# Patient Record
Sex: Male | Born: 1955 | Race: Black or African American | Hispanic: No | Marital: Single | Smoking: Current every day smoker
Health system: Southern US, Community
[De-identification: ages and names within clinical notes are randomized; demographics above are authoritative.]

## PROBLEM LIST (undated history)

## (undated) DIAGNOSIS — M549 Dorsalgia, unspecified: Secondary | ICD-10-CM

## (undated) DIAGNOSIS — R200 Anesthesia of skin: Secondary | ICD-10-CM

## (undated) DIAGNOSIS — F99 Mental disorder, not otherwise specified: Secondary | ICD-10-CM

## (undated) DIAGNOSIS — S92901A Unspecified fracture of right foot, initial encounter for closed fracture: Secondary | ICD-10-CM

## (undated) DIAGNOSIS — Z992 Dependence on renal dialysis: Secondary | ICD-10-CM

## (undated) DIAGNOSIS — G8929 Other chronic pain: Secondary | ICD-10-CM

## (undated) DIAGNOSIS — I10 Essential (primary) hypertension: Secondary | ICD-10-CM

## (undated) DIAGNOSIS — D649 Anemia, unspecified: Secondary | ICD-10-CM

## (undated) DIAGNOSIS — F419 Anxiety disorder, unspecified: Secondary | ICD-10-CM

## (undated) DIAGNOSIS — N289 Disorder of kidney and ureter, unspecified: Secondary | ICD-10-CM

## (undated) DIAGNOSIS — K759 Inflammatory liver disease, unspecified: Secondary | ICD-10-CM

## (undated) HISTORY — DX: Anesthesia of skin: R20.0

## (undated) HISTORY — PX: OTHER SURGICAL HISTORY: SHX169

---

## 2004-03-19 ENCOUNTER — Emergency Department (HOSPITAL_COMMUNITY): Admission: EM | Admit: 2004-03-19 | Discharge: 2004-03-19 | Payer: Self-pay | Admitting: Emergency Medicine

## 2004-07-06 ENCOUNTER — Emergency Department (HOSPITAL_COMMUNITY): Admission: EM | Admit: 2004-07-06 | Discharge: 2004-07-06 | Payer: Self-pay | Admitting: Emergency Medicine

## 2004-07-09 ENCOUNTER — Emergency Department (HOSPITAL_COMMUNITY): Admission: EM | Admit: 2004-07-09 | Discharge: 2004-07-09 | Payer: Self-pay | Admitting: *Deleted

## 2004-07-11 ENCOUNTER — Emergency Department (HOSPITAL_COMMUNITY): Admission: EM | Admit: 2004-07-11 | Discharge: 2004-07-11 | Payer: Self-pay | Admitting: Emergency Medicine

## 2004-07-18 ENCOUNTER — Ambulatory Visit (HOSPITAL_COMMUNITY): Admission: RE | Admit: 2004-07-18 | Discharge: 2004-07-18 | Payer: Self-pay | Admitting: Family Medicine

## 2005-10-01 ENCOUNTER — Emergency Department (HOSPITAL_COMMUNITY): Admission: EM | Admit: 2005-10-01 | Discharge: 2005-10-01 | Payer: Self-pay | Admitting: Emergency Medicine

## 2007-06-26 ENCOUNTER — Inpatient Hospital Stay (HOSPITAL_COMMUNITY): Admission: EM | Admit: 2007-06-26 | Discharge: 2007-06-29 | Payer: Self-pay | Admitting: Emergency Medicine

## 2010-03-11 ENCOUNTER — Ambulatory Visit (HOSPITAL_COMMUNITY): Admission: RE | Admit: 2010-03-11 | Discharge: 2010-03-11 | Payer: Self-pay | Admitting: Urology

## 2010-07-07 ENCOUNTER — Emergency Department (HOSPITAL_COMMUNITY): Admission: EM | Admit: 2010-07-07 | Discharge: 2010-07-07 | Payer: Self-pay | Admitting: Emergency Medicine

## 2011-01-12 LAB — BLOOD GAS, ARTERIAL
Patient temperature: 37
TCO2: 20.4 mmol/L (ref 0–100)
pH, Arterial: 7.377 (ref 7.350–7.450)
pO2, Arterial: 74.5 mmHg — ABNORMAL LOW (ref 80.0–100.0)

## 2011-01-12 LAB — URINALYSIS, ROUTINE W REFLEX MICROSCOPIC
Bilirubin Urine: NEGATIVE
Hgb urine dipstick: NEGATIVE
Protein, ur: NEGATIVE mg/dL
Specific Gravity, Urine: 1.01 (ref 1.005–1.030)
pH: 5.5 (ref 5.0–8.0)

## 2011-01-12 LAB — CBC
HCT: 41.6 % (ref 39.0–52.0)
Hemoglobin: 14.1 g/dL (ref 13.0–17.0)
MCH: 31.5 pg (ref 26.0–34.0)
MCHC: 34 g/dL (ref 30.0–36.0)
MCV: 92.7 fL (ref 78.0–100.0)
Platelets: 213 10*3/uL (ref 150–400)
WBC: 7.9 10*3/uL (ref 4.0–10.5)

## 2011-01-12 LAB — DIFFERENTIAL
Basophils Relative: 1 % (ref 0–1)
Eosinophils Absolute: 0.1 10*3/uL (ref 0.0–0.7)
Monocytes Relative: 7 % (ref 3–12)

## 2011-01-12 LAB — BASIC METABOLIC PANEL
Chloride: 104 mEq/L (ref 96–112)
Sodium: 134 mEq/L — ABNORMAL LOW (ref 135–145)

## 2011-01-12 LAB — URINE MICROSCOPIC-ADD ON: Urine-Other: NONE SEEN

## 2011-01-12 LAB — GLUCOSE, CAPILLARY: Glucose-Capillary: 468 mg/dL — ABNORMAL HIGH (ref 70–99)

## 2011-03-14 NOTE — H&P (Signed)
NAME:  Charles [name], Charles [name] NO.:  [id number]   MEDICAL RECORD NO.:  [id number]          PATIENT TYPE:  INP   LOCATION:  A222                          FACILITY:  [facility]   PHYSICIAN:  Salem Caster, DO    DATE OF BIRTH:  [birth year]   DATE OF ADMISSION:  06/26/2007  DATE OF DISCHARGE:  LH                              HISTORY & PHYSICAL   CHIEF COMPLAINT:  Elevated blood sugar and a penile rash.   HISTORY OF PRESENT ILLNESS:  This is a 55 year old African/American male  who presents from the [facility], secondary to elevated blood  sugars.  Apparently the patient went to the Health Department today,  secondary to having a three to four-day history of a penile rash.  The  patient states that he noticed a rash on his penis for a few days, and  decided to be evaluated at the Health Department.  After being seen at  the Health Department and after blood work was done, the patient was  found to have an elevated blood sugar and was sent directly to the [facility] Emergency Room for evaluation.   Upon being seen in the emergency room  the patient was found to have a  blood sugar of 784, but otherwise the patient has no complaints, other  than his penile rash.  The patient is awake and alert and oriented, and  in no acute distress at this time.   PAST MEDICAL HISTORY:  Remarkable.  The patient has no previous medical  history.  He does not see physicians on a regular basis.   HOME MEDICATIONS:  None.   PAST SURGICAL HISTORY:  None.   SOCIAL HISTORY:  The patient is a one-pack-per-day smoker for over 20  years.  The patient is a heavy drinker.  He admits to drinking three to  five 40-ounce beers daily.  The patient also admits to drug use.  He  used crack cocaine one week prior.   FAMILY HISTORY:  Diabetes in his brother.  The patient does not know any  other history of his family members.   ALLERGIES:  No known drug allergies.   REVIEW OF SYSTEMS:   GENERAL:  He denies any appetite problems, weight  change, fever or chills.  HEENT:  Unremarkable.  CARDIOVASCULAR:  No  chest pain.  RESPIRATORY:  No dyspnea or shortness of breath.  GASTROINTESTINAL:  No abdominal pain or  diarrhea.  GENITOURINARY:  No  dysuria.  Positive for a penile rash.  MUSCULOSKELETAL:  No arthralgias,  neck or back pain.  All other systems are negative.   PHYSICAL EXAMINATION:  GENERAL:  The patient  is well-hydrated, in no  acute distress, well-developed and well-nourished, alert and oriented.  HEENT:  Head normocephalic and atraumatic.  Pupils equal, round,  reactive to light and accommodation.  EOMI.  NECK:  No thyromegaly, no jugular venous distention.  Neck is soft and  supple, nontender, non-distended  CARDIOVASCULAR:  A regular rate and rhythm.  No murmurs, rubs or  gallops.  LUNGS:  Clear to auscultation bilaterally.  No  rales, rhonchi or  wheezing.  ABDOMEN:  Soft, nontender, non-distended.  No rebound, rigidity or  guarding.  Positive bowel sounds.  GENITOURINARY:  Excoriation and fungal infection around the glans penis.  No discharge noted.  EXTREMITIES:  No clubbing, cyanosis or edema.  A full range of motion.  NEUROLOGIC:  Cranial nerves II-XII  grossly intact.  SKIN:  No rashes or petechiae noted.  VITAL SIGNS:  Temperature 97.3 degrees, respirations 20, pulse 90, blood  pressure 125/73.   LABORATORY DATA:  Sodium 127, potassium 4.2, chloride 95, CO2 of 25,  glucose 784, BUN 14, creatinine 1.17.  Calcium 9.3, albumin 3.9, AST 40,  ALT 59, alkaline phosphatase 158, total bilirubin 0.9.  Urine:  Greater  than 1000 glucose, no hemoglobin, no ketones, no nitrites, no leukocyte  esterase.  White count 8.2, hemoglobin 14.9, hematocrit 42.7, platelets  212.   ASSESSMENT:  This is a 54 year old black male who presents from the  Health Department with an elevated blood sugar and a penile infection.  When seen in the emergency room the patient was  found to have a blood  sugar in the 700 range.  The patient is stable, alert and oriented.  He  does not appear to be in diabetic ketoacidosis.   The patient was started on insulin via Glucommander in the emergency  room.  The patient's blood sugars are noted in the mid-300 range.   PLAN:  1. Hyperglycemia, probably newly-diagnosed diabetes:  The patient was      placed on Lantus daily, as well as Metformin orally.  He will get      diabetes education.  The patient's blood sugars will be checked      q.a.c. and q.h.s.  The patient will be placed on a moderate sliding      scale.  2. Yeast infection on his penis:  The patient will be given Mycostatin      cream three times a day.  3. Hyponatremia:  The patient will be IV-hydrated with normal saline      and the sodium will be rechecked in the a.m.      Salem Caster, DO  Electronically Signed     SM/MEDQ  D:  06/26/2007  T:  06/27/2007  Job:  [id number]

## 2011-03-14 NOTE — Discharge Summary (Signed)
NAME:  Charles [name], SHAKE NO.:  [id number]   MEDICAL RECORD NO.:  [id number]          PATIENT TYPE:  INP   LOCATION:  A222                          FACILITY:  [facility]   PHYSICIAN:  Bonnielee Haff, MD     DATE OF BIRTH:  04-Jan-1956   DATE OF ADMISSION:  06/26/2007  DATE OF DISCHARGE:  LH                               DISCHARGE SUMMARY   The patient goes to the health department for his medical needs.   DISCHARGE DIAGNOSES:  1. New-onset diabetes requiring insulin.  2. Tobacco abuse.  3. Alcohol abuse.  4. Cocaine abuse as well.   BRIEF HOSPITAL COURSE:  Please review Dr. Maxie Barb H&P for details  regarding the patient's presenting illness.   Briefly, this is a 55 year old African American male who presented from  the health department after they found elevated blood sugars.  The  patient was found to have a blood sugar of 784 in the hospital.  He was  put on Glucommander and was transitioned to Lantus insulin.  The patient  has done well in the hospital.  His blood sugars are now running 189,  215, 137.  He has been given diabetes education.  He has been taught how  to self-administer insulin.  He has been told the importance of being  compliant with his medications.  His fasting lipid profile showed an LDL  of 96.  His triglycerides were 317.  Diet modification was suggested.  Urine did not show any evidence for proteinuria.   Today we will try to arrange prescription assistance for this gentleman.  If we are able to do that, he will be discharged later today.   During this admission he was also complaining of itching over his penis  and he was found to have a yeast infection there.  He mentioned that he  did have an HIV test done just a week ago and apparently was negative.  Anyway, Nystatin was prescribed and his lesion is better.   Otherwise, today he does not have any complaints to offer.  He said he  had uneventful night.  His vital signs are all stable,  examination  unremarkable.   ASSESSMENT AND PLAN:  As per above.   DISCHARGE MEDICATIONS:  1. Lantus insulin 40 units q.h.s. subcu.  2. Nystatin cream to his penis twice daily for yeast infection.  3. Metformin 500 mg p.o. b.i.d.  4. Aspirin 81 mg p.o. daily.   DIET:  Modified carbohydrate diet.   PHYSICAL ACTIVITY:  No restrictions.   No consultations obtained during this admission.   He did have a chest x-ray which did not show any acute cardiopulmonary  process.   FOLLOWUP:  Health department - he has been asked to go there on Tuesday,  September 2.   Total time at discharge 35 minutes.      Bonnielee Haff, MD  Electronically Signed     GK/MEDQ  D:  06/29/2007  T:  06/29/2007  Job:  [id number]

## 2011-03-14 NOTE — Group Therapy Note (Signed)
NAME:  Charles [name], Charles [name] NO.:  [id number]   MEDICAL RECORD NO.:  [id number]          PATIENT TYPE:  INP   LOCATION:  A222                          FACILITY:  [facility]   PHYSICIAN:  Anselmo Pickler, DO    DATE OF BIRTH:  Nov 29, 1955   DATE OF PROCEDURE:  06/27/2007  DATE OF DISCHARGE:                                 PROGRESS NOTE   The patient was seen today resting comfortably in bed.  He was taken off  of the Glucommander.  Blood sugars are still running high but he is on  sliding scale coverage.  Discussed with him his diabetes and the course  of how he was diagnosed, and also discussed with the patient that we  will try to find him a primary care physician upon discharge.  His vital  signs are 98.5, pulse 62, respirations 24, blood pressure 136/84.   GENERAL:  The patient seen comfortably resting in bed.  HEART:  Regular rate and rhythm, no murmur noted.  LUNGS:  Clear to auscultation bilaterally.  ABDOMEN:  Soft, nontender, nondistended.  GENITOURINARY:  Positive candida infection of his penis.  LOWER EXTREMITIES:  Positive pulses in all four extremities.  No edema,  cyanosis or ecchymosis.   LABORATORIES:  White count of 7.5, hemoglobin 14.3, hematocrit 41.4, and  platelet count of 193.  His sodium was 136, potassium 4.1, chloride 105,  CO2 25, glucose 273, BUN 11, creatinine 0.93, calcium 8.6.   ASSESSMENT AND PLAN:  1. Hyperglycemia, new-onset diabetes.  2. Yeast infection of the penis.   Will continue the patient on sliding scale and start Lantus tonight, and  continue to monitor him.  Also, he has been ordered to start metformin  as well, and will continue with CBGs a.c. and q.h.s., and also will  continue with the Mycostatin.  Plan on discharging the patient in 1-2  days.      Anselmo Pickler, DO  Electronically Signed     CB/MEDQ  D:  06/27/2007  T:  06/27/2007  Job:  [id number]

## 2011-08-11 LAB — URINALYSIS, ROUTINE W REFLEX MICROSCOPIC
Hgb urine dipstick: NEGATIVE
Nitrite: NEGATIVE
Protein, ur: NEGATIVE
Specific Gravity, Urine: 1.01
Urobilinogen, UA: 0.2

## 2011-08-11 LAB — BASIC METABOLIC PANEL
CO2: 25
Calcium: 8.7
Chloride: 105
Chloride: 105
Creatinine, Ser: 0.89
GFR calc Af Amer: >60
GFR calc non Af Amer: >60
GFR calc non Af Amer: >60
Potassium: 4.1
Sodium: 136

## 2011-08-11 LAB — URINE MICROSCOPIC-ADD ON

## 2011-08-11 LAB — PROTIME-INR
INR: 1
Prothrombin Time: 13.9

## 2011-08-11 LAB — RAPID URINE DRUG SCREEN, HOSP PERFORMED
Amphetamines: NOT DETECTED
Barbiturates: NOT DETECTED
Benzodiazepines: NOT DETECTED
Opiates: NOT DETECTED

## 2011-08-11 LAB — TSH: TSH: 1.089

## 2011-08-11 LAB — CBC
HCT: 41.4
HCT: 42.7
Hemoglobin: 14.3
MCHC: 34.9
MCV: 91.3
MCV: 92.4
RBC: 4.54
RDW: 12.9
RDW: 13
WBC: 7.5
WBC: 8.2

## 2011-08-11 LAB — DIFFERENTIAL
Basophils Relative: 0
Eosinophils Absolute: 0.1
Eosinophils Relative: 1
Eosinophils Relative: 2
Lymphocytes Relative: 33
Lymphs Abs: 3.2
Monocytes Relative: 6
Neutrophils Relative %: 61

## 2011-08-11 LAB — CULTURE, BLOOD (ROUTINE X 2)
Culture: NO GROWTH
Report Status: 9022008
Report Status: 9022008

## 2011-08-11 LAB — URINE CULTURE: Special Requests: NEGATIVE

## 2011-08-11 LAB — COMPREHENSIVE METABOLIC PANEL
ALT: 59 — ABNORMAL HIGH
AST: 40 — ABNORMAL HIGH
Alkaline Phosphatase: 158 — ABNORMAL HIGH
Creatinine, Ser: 1.17
GFR calc Af Amer: >60
Potassium: 4.2
Total Bilirubin: 0.9

## 2011-08-11 LAB — GLUCOSE, RANDOM: Glucose, Bld: 590

## 2011-08-11 LAB — LIPID PANEL
Cholesterol: 188
HDL: 29 — ABNORMAL LOW

## 2011-08-11 LAB — HEMOGLOBIN A1C: Hgb A1c MFr Bld: 10.6 — ABNORMAL HIGH

## 2011-08-11 LAB — APTT: aPTT: 24

## 2011-08-20 ENCOUNTER — Emergency Department (HOSPITAL_COMMUNITY): Payer: Self-pay

## 2011-08-20 ENCOUNTER — Inpatient Hospital Stay (HOSPITAL_COMMUNITY)
Admission: EM | Admit: 2011-08-20 | Discharge: 2011-08-24 | DRG: 493 | Disposition: A | Discharge status: Skilled Nursing Facility | Payer: No Typology Code available for payment source | Source: Ambulatory Visit | Attending: Surgery | Admitting: Surgery

## 2011-08-20 DIAGNOSIS — S060X9A Concussion with loss of consciousness of unspecified duration, initial encounter: Secondary | ICD-10-CM

## 2011-08-20 DIAGNOSIS — E669 Obesity, unspecified: Secondary | ICD-10-CM | POA: Diagnosis present

## 2011-08-20 DIAGNOSIS — R51 Headache: Secondary | ICD-10-CM | POA: Diagnosis present

## 2011-08-20 DIAGNOSIS — F1010 Alcohol abuse, uncomplicated: Secondary | ICD-10-CM | POA: Diagnosis present

## 2011-08-20 DIAGNOSIS — M549 Dorsalgia, unspecified: Secondary | ICD-10-CM | POA: Diagnosis present

## 2011-08-20 DIAGNOSIS — S91109A Unspecified open wound of unspecified toe(s) without damage to nail, initial encounter: Secondary | ICD-10-CM | POA: Diagnosis present

## 2011-08-20 DIAGNOSIS — S82209A Unspecified fracture of shaft of unspecified tibia, initial encounter for closed fracture: Secondary | ICD-10-CM

## 2011-08-20 DIAGNOSIS — Z79899 Other long term (current) drug therapy: Secondary | ICD-10-CM

## 2011-08-20 DIAGNOSIS — Z794 Long term (current) use of insulin: Secondary | ICD-10-CM

## 2011-08-20 DIAGNOSIS — D62 Acute posthemorrhagic anemia: Secondary | ICD-10-CM | POA: Diagnosis present

## 2011-08-20 DIAGNOSIS — I10 Essential (primary) hypertension: Secondary | ICD-10-CM | POA: Diagnosis present

## 2011-08-20 DIAGNOSIS — E119 Type 2 diabetes mellitus without complications: Secondary | ICD-10-CM | POA: Diagnosis present

## 2011-08-20 DIAGNOSIS — F17200 Nicotine dependence, unspecified, uncomplicated: Secondary | ICD-10-CM | POA: Diagnosis present

## 2011-08-20 DIAGNOSIS — F209 Schizophrenia, unspecified: Secondary | ICD-10-CM | POA: Diagnosis present

## 2011-08-20 DIAGNOSIS — G8929 Other chronic pain: Secondary | ICD-10-CM | POA: Diagnosis present

## 2011-08-20 DIAGNOSIS — S92909A Unspecified fracture of unspecified foot, initial encounter for closed fracture: Secondary | ICD-10-CM

## 2011-08-20 DIAGNOSIS — S82409A Unspecified fracture of shaft of unspecified fibula, initial encounter for closed fracture: Secondary | ICD-10-CM

## 2011-08-20 DIAGNOSIS — S93336A Other dislocation of unspecified foot, initial encounter: Secondary | ICD-10-CM | POA: Diagnosis present

## 2011-08-20 LAB — URINALYSIS, ROUTINE W REFLEX MICROSCOPIC
Bilirubin Urine: NEGATIVE
Glucose, UA: >1000 mg/dL — AB
Nitrite: NEGATIVE
Specific Gravity, Urine: 1.03 (ref 1.005–1.030)
pH: 5 (ref 5.0–8.0)

## 2011-08-20 LAB — COMPREHENSIVE METABOLIC PANEL
ALT: 45 U/L (ref 0–53)
AST: 33 U/L (ref 0–37)
Albumin: 3.7 g/dL (ref 3.5–5.2)
Alkaline Phosphatase: 103 U/L (ref 39–117)
Chloride: 103 mEq/L (ref 96–112)
Potassium: 3.8 mEq/L (ref 3.5–5.1)
Sodium: 134 mEq/L — ABNORMAL LOW (ref 135–145)
Total Bilirubin: 0.2 mg/dL — ABNORMAL LOW (ref 0.3–1.2)
Total Protein: 7.2 g/dL (ref 6.0–8.3)

## 2011-08-20 LAB — GLUCOSE, CAPILLARY

## 2011-08-20 LAB — CBC
Hemoglobin: 13.9 g/dL (ref 13.0–17.0)
MCH: 31.6 pg (ref 26.0–34.0)
MCHC: 34.4 g/dL (ref 30.0–36.0)
MCV: 91.8 fL (ref 78.0–100.0)
Platelets: ADEQUATE 10*3/uL (ref 150–400)
RBC: 4.4 MIL/uL (ref 4.22–5.81)

## 2011-08-20 LAB — POCT I-STAT, CHEM 8
Calcium, Ion: 1.22 mmol/L (ref 1.12–1.32)
Creatinine, Ser: 0.9 mg/dL (ref 0.50–1.35)
Glucose, Bld: 231 mg/dL — ABNORMAL HIGH (ref 70–99)
HCT: 43 % (ref 39.0–52.0)
Hemoglobin: 14.6 g/dL (ref 13.0–17.0)
Potassium: 3.9 mEq/L (ref 3.5–5.1)

## 2011-08-20 LAB — LACTIC ACID, PLASMA: Lactic Acid, Venous: 2.9 mmol/L — ABNORMAL HIGH (ref 0.5–2.2)

## 2011-08-20 LAB — PROTIME-INR: Prothrombin Time: 13.7 seconds (ref 11.6–15.2)

## 2011-08-20 LAB — ABO/RH: ABO/RH(D): O POS

## 2011-08-20 MED ORDER — IOHEXOL 300 MG/ML  SOLN
100.0000 mL | Freq: Once | INTRAMUSCULAR | Status: AC | PRN
  Administered 2011-08-20: 100 mL via INTRAVENOUS

## 2011-08-21 ENCOUNTER — Emergency Department (HOSPITAL_COMMUNITY): Payer: No Typology Code available for payment source

## 2011-08-21 LAB — BASIC METABOLIC PANEL
Calcium: 8.1 mg/dL — ABNORMAL LOW (ref 8.4–10.5)
Chloride: 104 mEq/L (ref 96–112)
Creatinine, Ser: 0.85 mg/dL (ref 0.50–1.35)
GFR calc Af Amer: >90 mL/min (ref >90)
Sodium: 137 mEq/L (ref 135–145)

## 2011-08-21 LAB — GLUCOSE, CAPILLARY
Glucose-Capillary: 188 mg/dL — ABNORMAL HIGH (ref 70–99)
Glucose-Capillary: 223 mg/dL — ABNORMAL HIGH (ref 70–99)
Glucose-Capillary: 236 mg/dL — ABNORMAL HIGH (ref 70–99)
Glucose-Capillary: 244 mg/dL — ABNORMAL HIGH (ref 70–99)
Glucose-Capillary: 214 mg/dL — ABNORMAL HIGH (ref 70–99)

## 2011-08-21 LAB — CBC
MCH: 30.6 pg (ref 26.0–34.0)
Platelets: 177 10*3/uL (ref 150–400)
RBC: 2.81 MIL/uL — ABNORMAL LOW (ref 4.22–5.81)

## 2011-08-22 LAB — CBC
HCT: 22.4 % — ABNORMAL LOW (ref 39.0–52.0)
MCHC: 33.9 g/dL (ref 30.0–36.0)
MCV: 92.2 fL (ref 78.0–100.0)
Platelets: 159 10*3/uL (ref 150–400)
RDW: 13.7 % (ref 11.5–15.5)
WBC: 11.8 10*3/uL — ABNORMAL HIGH (ref 4.0–10.5)

## 2011-08-22 LAB — GLUCOSE, CAPILLARY
Glucose-Capillary: 257 mg/dL — ABNORMAL HIGH (ref 70–99)
Glucose-Capillary: 125 mg/dL — ABNORMAL HIGH (ref 70–99)

## 2011-08-23 ENCOUNTER — Inpatient Hospital Stay (HOSPITAL_COMMUNITY): Payer: No Typology Code available for payment source

## 2011-08-23 LAB — GLUCOSE, CAPILLARY
Glucose-Capillary: 123 mg/dL — ABNORMAL HIGH (ref 70–99)
Glucose-Capillary: 159 mg/dL — ABNORMAL HIGH (ref 70–99)

## 2011-08-23 LAB — CBC
MCHC: 33 g/dL (ref 30.0–36.0)
Platelets: 170 10*3/uL (ref 150–400)
RDW: 13.7 % (ref 11.5–15.5)
WBC: 14.6 10*3/uL — ABNORMAL HIGH (ref 4.0–10.5)

## 2011-08-24 LAB — TYPE AND SCREEN
Antibody Screen: NEGATIVE
Unit division: 0

## 2011-08-24 LAB — URINALYSIS, MICROSCOPIC ONLY
Glucose, UA: 100 mg/dL — AB
Hgb urine dipstick: NEGATIVE
Leukocytes, UA: NEGATIVE
Protein, ur: NEGATIVE mg/dL
Specific Gravity, Urine: 1.026 (ref 1.005–1.030)
Urobilinogen, UA: 2 mg/dL — ABNORMAL HIGH (ref 0.0–1.0)

## 2011-08-24 LAB — CBC
Hemoglobin: 7 g/dL — ABNORMAL LOW (ref 13.0–17.0)
MCH: 31.1 pg (ref 26.0–34.0)
MCV: 93.8 fL (ref 78.0–100.0)
Platelets: 204 10*3/uL (ref 150–400)
RBC: 2.25 MIL/uL — ABNORMAL LOW (ref 4.22–5.81)
WBC: 13.3 10*3/uL — ABNORMAL HIGH (ref 4.0–10.5)

## 2011-08-24 LAB — GLUCOSE, CAPILLARY: Glucose-Capillary: 145 mg/dL — ABNORMAL HIGH (ref 70–99)
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
## 2011-08-25 LAB — URINE CULTURE: Culture  Setup Time: 201210251216
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
## 2011-08-28 LAB — POCT I-STAT 4, (NA,K, GLUC, HGB,HCT)
Glucose, Bld: 169 mg/dL — ABNORMAL HIGH (ref 70–99)
Hemoglobin: 11.9 g/dL — ABNORMAL LOW (ref 13.0–17.0)
Potassium: 4 meq/L (ref 3.5–5.1)
Sodium: 138 meq/L (ref 135–145)

## 2011-09-01 ENCOUNTER — Encounter (HOSPITAL_BASED_OUTPATIENT_CLINIC_OR_DEPARTMENT_OTHER): Payer: Self-pay | Admitting: *Deleted

## 2011-09-01 NOTE — Progress Notes (Signed)
09/01/2011 [facility] facility to speak with Elmyra Ricks --- told she was in a meeting and could not be disturbed - asked receptionist to take down the following and given to her ---- pt needs to be here at 11:00am Tuesday and for him not to take any of his blood thinner between now and then.

## 2011-09-01 NOTE — Progress Notes (Signed)
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 Dr. Albertina Parr said to call and speak with Dr. Beola Cord-- explained above to him and he said OK he needed to fix pt --- he said pt has a hard shoe on one foot and a boot on the other.
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
## 2011-09-05 ENCOUNTER — Encounter (HOSPITAL_BASED_OUTPATIENT_CLINIC_OR_DEPARTMENT_OTHER): Payer: Self-pay

## 2011-09-05 ENCOUNTER — Encounter (HOSPITAL_BASED_OUTPATIENT_CLINIC_OR_DEPARTMENT_OTHER): Payer: Self-pay | Admitting: Anesthesiology

## 2011-09-05 ENCOUNTER — Ambulatory Visit (HOSPITAL_BASED_OUTPATIENT_CLINIC_OR_DEPARTMENT_OTHER)
Admission: RE | Admit: 2011-09-05 | Discharge: 2011-09-05 | Disposition: A | Discharge status: Home / Self Care | Payer: Self-pay | Source: Ambulatory Visit | Attending: Orthopedic Surgery | Admitting: Orthopedic Surgery

## 2011-09-05 ENCOUNTER — Emergency Department (HOSPITAL_COMMUNITY)
Admission: EM | Admit: 2011-09-05 | Discharge: 2011-09-05 | Discharge status: Against Medical Advice | Payer: No Typology Code available for payment source | Attending: Emergency Medicine | Admitting: Emergency Medicine

## 2011-09-05 ENCOUNTER — Encounter (HOSPITAL_BASED_OUTPATIENT_CLINIC_OR_DEPARTMENT_OTHER)
Admission: RE | Disposition: A | Discharge status: Home / Self Care | Payer: Self-pay | Source: Ambulatory Visit | Attending: Orthopedic Surgery

## 2011-09-05 ENCOUNTER — Ambulatory Visit (HOSPITAL_BASED_OUTPATIENT_CLINIC_OR_DEPARTMENT_OTHER): Payer: Self-pay | Admitting: Anesthesiology

## 2011-09-05 DIAGNOSIS — F411 Generalized anxiety disorder: Secondary | ICD-10-CM | POA: Insufficient documentation

## 2011-09-05 DIAGNOSIS — E119 Type 2 diabetes mellitus without complications: Secondary | ICD-10-CM | POA: Insufficient documentation

## 2011-09-05 DIAGNOSIS — I10 Essential (primary) hypertension: Secondary | ICD-10-CM | POA: Insufficient documentation

## 2011-09-05 DIAGNOSIS — M21619 Bunion of unspecified foot: Secondary | ICD-10-CM | POA: Insufficient documentation

## 2011-09-05 DIAGNOSIS — Z0389 Encounter for observation for other suspected diseases and conditions ruled out: Secondary | ICD-10-CM | POA: Insufficient documentation

## 2011-09-05 DIAGNOSIS — S93129A Dislocation of metatarsophalangeal joint of unspecified toe(s), initial encounter: Secondary | ICD-10-CM | POA: Insufficient documentation

## 2011-09-05 DIAGNOSIS — S93529A Sprain of metatarsophalangeal joint of unspecified toe(s), initial encounter: Secondary | ICD-10-CM | POA: Insufficient documentation

## 2011-09-05 DIAGNOSIS — F1010 Alcohol abuse, uncomplicated: Secondary | ICD-10-CM | POA: Insufficient documentation

## 2011-09-05 DIAGNOSIS — Y998 Other external cause status: Secondary | ICD-10-CM | POA: Insufficient documentation

## 2011-09-05 DIAGNOSIS — F209 Schizophrenia, unspecified: Secondary | ICD-10-CM | POA: Insufficient documentation

## 2011-09-05 HISTORY — DX: Other chronic pain: G89.29

## 2011-09-05 HISTORY — DX: Anxiety disorder, unspecified: F41.9

## 2011-09-05 HISTORY — DX: Unspecified fracture of right foot, initial encounter for closed fracture: S92.901A

## 2011-09-05 HISTORY — DX: Essential (primary) hypertension: I10

## 2011-09-05 HISTORY — DX: Anemia, unspecified: D64.9

## 2011-09-05 HISTORY — DX: Dorsalgia, unspecified: M54.9

## 2011-09-05 HISTORY — DX: Mental disorder, not otherwise specified: F99

## 2011-09-05 HISTORY — PX: ORIF TOE FRACTURE: SHX5032

## 2011-09-05 LAB — POCT I-STAT, CHEM 8
BUN: 51 mg/dL — ABNORMAL HIGH (ref 6–23)
Creatinine, Ser: 1.9 mg/dL — ABNORMAL HIGH (ref 0.50–1.35)
Sodium: 135 mEq/L (ref 135–145)
TCO2: 21 mmol/L (ref 0–100)

## 2011-09-05 SURGERY — OPEN REDUCTION INTERNAL FIXATION (ORIF) METATARSAL (TOE) FRACTURE
Anesthesia: General | Site: Toe | Laterality: Left | Wound class: Clean

## 2011-09-05 MED ORDER — BUPIVACAINE HCL (PF) 0.5 % IJ SOLN
INTRAMUSCULAR | Status: DC | PRN
  Administered 2011-09-05: 10 mL

## 2011-09-05 MED ORDER — LIDOCAINE HCL (CARDIAC) 20 MG/ML IV SOLN
INTRAVENOUS | Status: DC | PRN
  Administered 2011-09-05: 50 mg via INTRAVENOUS

## 2011-09-05 MED ORDER — FENTANYL CITRATE 0.05 MG/ML IJ SOLN
INTRAMUSCULAR | Status: DC | PRN
  Administered 2011-09-05: 25 ug via INTRAVENOUS
  Administered 2011-09-05: 100 ug via INTRAVENOUS
  Administered 2011-09-05: 25 ug via INTRAVENOUS

## 2011-09-05 MED ORDER — ONDANSETRON HCL 4 MG/2ML IJ SOLN
INTRAMUSCULAR | Status: DC | PRN
  Administered 2011-09-05: 4 mg via INTRAVENOUS

## 2011-09-05 MED ORDER — PHENYLEPHRINE HCL 10 MG/ML IJ SOLN
INTRAMUSCULAR | Status: DC | PRN
  Administered 2011-09-05 (×3): 80 ug via INTRAVENOUS

## 2011-09-05 MED ORDER — CEFAZOLIN SODIUM 1-5 GM-% IV SOLN
1.0000 g | INTRAVENOUS | Status: AC
  Administered 2011-09-05: 2 g via INTRAVENOUS

## 2011-09-05 MED ORDER — LACTATED RINGERS IV SOLN
INTRAVENOUS | Status: DC
  Administered 2011-09-05 (×2): via INTRAVENOUS

## 2011-09-05 MED ORDER — PROPOFOL 10 MG/ML IV EMUL
INTRAVENOUS | Status: DC | PRN
  Administered 2011-09-05: 200 mg via INTRAVENOUS

## 2011-09-05 MED ORDER — HYDROMORPHONE HCL PF 1 MG/ML IJ SOLN
0.2500 mg | INTRAMUSCULAR | Status: DC | PRN
  Administered 2011-09-05: 0.5 mg via INTRAVENOUS

## 2011-09-05 MED ORDER — PROMETHAZINE HCL 25 MG/ML IJ SOLN: 6.2500 mg | INTRAMUSCULAR | Status: DC | PRN

## 2011-09-05 MED ORDER — MIDAZOLAM HCL 2 MG/2ML IJ SOLN: 0.5000 mg | INTRAMUSCULAR | Status: DC | PRN

## 2011-09-05 MED ORDER — EPHEDRINE SULFATE 50 MG/ML IJ SOLN
INTRAMUSCULAR | Status: DC | PRN
  Administered 2011-09-05: 10 mg via INTRAVENOUS
  Administered 2011-09-05: 15 mg via INTRAVENOUS

## 2011-09-05 SURGICAL SUPPLY — 59 items
BANDAGE ACE 4 STERILE (GAUZE/BANDAGES/DRESSINGS) ×4 IMPLANT
BANDAGE ELASTIC 4 VELCRO ST LF (GAUZE/BANDAGES/DRESSINGS) IMPLANT
BLADE SURG 15 STRL LF DISP TIS (BLADE) ×2 IMPLANT
BLADE SURG 15 STRL SS (BLADE) ×2
BRUSH SCRUB EZ PLAIN DRY (MISCELLANEOUS) ×2 IMPLANT
CLOTH BEACON ORANGE TIMEOUT ST (SAFETY) ×2 IMPLANT
COVER TABLE BACK 60X90 (DRAPES) ×2 IMPLANT
CUFF TOURNIQUET SINGLE 34IN LL (TOURNIQUET CUFF) ×2 IMPLANT
DRAPE EXTREMITY T 121X128X90 (DRAPE) ×2 IMPLANT
DRAPE OEC MINIVIEW 54X84 (DRAPES) IMPLANT
DRAPE SURG 17X23 STRL (DRAPES) ×2 IMPLANT
DRSG PAD ABDOMINAL 8X10 ST (GAUZE/BANDAGES/DRESSINGS) ×2 IMPLANT
DURA STEPPER LG (CAST SUPPLIES) ×4 IMPLANT
DURA STEPPER MED (CAST SUPPLIES) IMPLANT
DURA STEPPER SML (CAST SUPPLIES) IMPLANT
ELECT REM PT RETURN 9FT ADLT (ELECTROSURGICAL) ×2
ELECTRODE REM PT RTRN 9FT ADLT (ELECTROSURGICAL) ×1 IMPLANT
GAUZE SPONGE 4X4 16PLY XRAY LF (GAUZE/BANDAGES/DRESSINGS) IMPLANT
GAUZE XEROFORM 1X8 LF (GAUZE/BANDAGES/DRESSINGS) ×2 IMPLANT
GLOVE BIO SURGEON STRL SZ 6.5 (GLOVE) ×2 IMPLANT
GLOVE BIO SURGEON STRL SZ8 (GLOVE) ×2 IMPLANT
GLOVE BIOGEL PI IND STRL 8 (GLOVE) ×2 IMPLANT
GLOVE BIOGEL PI INDICATOR 8 (GLOVE) ×2
GLOVE SURG SS PI 8.0 STRL IVOR (GLOVE) ×2 IMPLANT
GOWN BRE IMP PREV XXLGXLNG (GOWN DISPOSABLE) ×2 IMPLANT
GOWN PREVENTION PLUS XLARGE (GOWN DISPOSABLE) ×4 IMPLANT
NEEDLE HYPO 22GX1.5 SAFETY (NEEDLE) IMPLANT
NEEDLE HYPO 25X1 1.5 SAFETY (NEEDLE) ×2 IMPLANT
NS IRRIG 1000ML POUR BTL (IV SOLUTION) ×2 IMPLANT
PACK BASIN DAY SURGERY FS (CUSTOM PROCEDURE TRAY) ×2 IMPLANT
PAD CAST 4YDX4 CTTN HI CHSV (CAST SUPPLIES) IMPLANT
PADDING CAST ABS 4INX4YD NS (CAST SUPPLIES) ×2
PADDING CAST ABS COTTON 4X4 ST (CAST SUPPLIES) ×2 IMPLANT
PADDING CAST COTTON 4X4 STRL (CAST SUPPLIES)
PADDING WEBRIL 4 STERILE (GAUZE/BANDAGES/DRESSINGS) ×2 IMPLANT
PENCIL BUTTON HOLSTER BLD 10FT (ELECTRODE) ×2 IMPLANT
SHEET MEDIUM DRAPE 40X70 STRL (DRAPES) ×4 IMPLANT
SLEEVE SCD COMPRESS KNEE MED (MISCELLANEOUS) IMPLANT
SPONGE GAUZE 4X4 12PLY (GAUZE/BANDAGES/DRESSINGS) ×2 IMPLANT
STOCKINETTE 6  STRL (DRAPES) ×1
STOCKINETTE 6 STRL (DRAPES) ×1 IMPLANT
SUCTION FRAZIER TIP 10 FR DISP (SUCTIONS) ×2 IMPLANT
SUT ETHILON 4 0 PS 2 18 (SUTURE) ×2 IMPLANT
SUT FIBERWIRE 3-0 18 TAPR NDL (SUTURE) ×6
SUT PDS AB 3-0 PS2 18 (SUTURE) ×2 IMPLANT
SUT VIC AB 2-0 PS2 27 (SUTURE) IMPLANT
SUT VIC AB 2-0 SH 27 (SUTURE) ×1
SUT VIC AB 2-0 SH 27XBRD (SUTURE) ×1 IMPLANT
SUT VIC AB 3-0 FS2 27 (SUTURE) IMPLANT
SUT VIC AB 3-0 PS1 18 (SUTURE)
SUT VIC AB 3-0 PS1 18XBRD (SUTURE) IMPLANT
SUTURE FIBERWR 3-0 18 TAPR NDL (SUTURE) ×3 IMPLANT
SYR BULB 3OZ (MISCELLANEOUS) ×2 IMPLANT
SYR CONTROL 10ML LL (SYRINGE) ×2 IMPLANT
TOWEL OR 17X24 6PK STRL BLUE (TOWEL DISPOSABLE) ×2 IMPLANT
TOWEL OR NON WOVEN STRL DISP B (DISPOSABLE) ×2 IMPLANT
TUBE CONNECTING 20X1/4 (TUBING) ×4 IMPLANT
UNDERPAD 30X30 INCONTINENT (UNDERPADS AND DIAPERS) ×2 IMPLANT
WATER STERILE IRR 1000ML POUR (IV SOLUTION) ×2 IMPLANT

## 2011-09-05 NOTE — Anesthesia Preprocedure Evaluation (Addendum)
Anesthesia Evaluation  Patient identified by MRN, date of birth, ID band Patient awake    Reviewed: Allergy & Precautions, H&P , NPO status , Patient's Chart, lab work & pertinent test results  Airway Mallampati: II TM Distance: >3 FB Neck ROM: Full    Dental  (+) Poor Dentition   Pulmonary  + rhonchi        Cardiovascular hypertension,     Neuro/Psych PSYCHIATRIC DISORDERS Anxiety Schizophrenia    GI/Hepatic (+)     substance abuse  alcohol use,   Endo/Other  Diabetes mellitus-, Poorly ControlledMorbid obesity  Renal/GU      Musculoskeletal   Abdominal   Peds  Hematology   Anesthesia Other Findings   Reproductive/Obstetrics                          Anesthesia Physical Anesthesia Plan  ASA: III  Anesthesia Plan: General   Post-op Pain Management:    Induction: Intravenous and Cricoid pressure planned  Airway Management Planned: Oral ETT  Additional Equipment:   Intra-op Plan:   Post-operative Plan: Extubation in OR  Informed Consent: I have reviewed the patients History and Physical, chart, labs and discussed the procedure including the risks, benefits and alternatives for the proposed anesthesia with the patient or authorized representative who has indicated his/her understanding and acceptance.     Plan Discussed with: Surgeon and CRNA  Anesthesia Plan Comments:        Anesthesia Quick Evaluation

## 2011-09-05 NOTE — H&P (Signed)
No change hx and physical exam.  Paper hx and physical placed on chart.

## 2011-09-05 NOTE — Transfer of Care (Signed)
Immediate Anesthesia Transfer of Care Note  Patient: Charles [name]  Procedure(s) Performed:  OPEN REDUCTION INTERNAL FIXATION (ORIF) METATARSAL (TOE) FRACTURE - reconstruction left great toe FHB plantar plate  Patient Location: PACU  Anesthesia Type: General  Level of Consciousness: awake  Airway & Oxygen Therapy: Patient Spontanous Breathing and Patient connected to face mask oxygen  Post-op Assessment: Report given to PACU RN and Post -op Vital signs reviewed and stable  Post vital signs: Reviewed and stable  Complications: No apparent anesthesia complications

## 2011-09-05 NOTE — Anesthesia Procedure Notes (Addendum)
Procedure Name: LMA Insertion Performed by: Melynda Ripple D Pre-anesthesia Checklist: Patient identified, Emergency Drugs available, Suction available, Patient being monitored and Timeout performed Patient Re-evaluated:Patient Re-evaluated prior to inductionOxygen Delivery Method: Circle System Utilized Preoxygenation: Pre-oxygenation with 100% oxygen Intubation Type: IV induction Ventilation: Mask ventilation without difficulty LMA: LMA with gastric port inserted LMA Size: 5.0 Number of attempts: 1 Placement Confirmation: breath sounds checked- equal and bilateral and positive ETCO2 Tube secured with: Tape Dental Injury: Teeth and Oropharynx as per pre-operative assessment
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
## 2011-09-05 NOTE — Anesthesia Postprocedure Evaluation (Signed)
  Anesthesia Post-op Note  Patient: Charles [name]  Procedure(s) Performed:  OPEN REDUCTION INTERNAL FIXATION (ORIF) METATARSAL (TOE) FRACTURE - reconstruction left great toe FHB plantar plate  Patient Location: PACU  Anesthesia Type: General  Level of Consciousness: awake, alert  and oriented  Airway and Oxygen Therapy: Patient Spontanous Breathing  Post-op Pain: mild  Post-op Assessment: Post-op Vital signs reviewed, Patient's Cardiovascular Status Stable, Respiratory Function Stable, Patent Airway, No signs of Nausea or vomiting, Adequate PO intake and Pain level controlled  Post-op Vital Signs: stable  Complications: No apparent anesthesia complications

## 2011-09-05 NOTE — Brief Op Note (Signed)
09/05/2011  2:59 PM  PATIENT:  Jannett Celestine  55 y.o. male  PRE-OPERATIVE DIAGNOSIS: 1.   left Flexor Hallicus Brevis rupture with dorsal great toe dislocation  2.  Traumatic Hallux Valgus  POST-OPERATIVE DIAGNOSIS:  same  PROCEDURE:  Procedure(s): 1.  Primary repair left flexor hallicus brevis tear 2.  Left Modified McBride Bunionectomy  SURGEON:  Surgeon(s): Colin Rhein, MD  PHYSICIAN ASSISTANT: Benita Stabile, PAC  ASSISTANTS: none   ANESTHESIA:   general  EBL:  Total I/O In: 1000 [I.V.:1000] Out: -   BLOOD ADMINISTERED:none  DRAINS: none   LOCAL MEDICATIONS USED:  NONE  SPECIMEN:  No Specimen  DISPOSITION OF SPECIMEN:  N/A  COUNTS:  YES  TOURNIQUET:  * Missing tourniquet times found for documented tourniquets in log:  6212 *  DICTATION: .Other Dictation: Dictation Number [id number]  PLAN OF CARE: Discharge to home after PACU  PATIENT DISPOSITION:  PACU - hemodynamically stable.   Delay start of Pharmacological VTE agent (>24hrs) due to surgical blood loss or risk of bleeding:  yes
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
## 2011-09-11 ENCOUNTER — Encounter (HOSPITAL_BASED_OUTPATIENT_CLINIC_OR_DEPARTMENT_OTHER): Payer: Self-pay | Admitting: Orthopedic Surgery

## 2012-05-01 ENCOUNTER — Other Ambulatory Visit (HOSPITAL_BASED_OUTPATIENT_CLINIC_OR_DEPARTMENT_OTHER): Payer: Self-pay | Admitting: Internal Medicine

## 2012-05-01 DIAGNOSIS — R29898 Other symptoms and signs involving the musculoskeletal system: Secondary | ICD-10-CM

## 2012-05-06 ENCOUNTER — Ambulatory Visit (HOSPITAL_COMMUNITY)
Admission: RE | Admit: 2012-05-06 | Discharge: 2012-05-06 | Disposition: A | Discharge status: Home / Self Care | Payer: Medicaid Other | Source: Ambulatory Visit | Attending: Internal Medicine | Admitting: Internal Medicine

## 2012-05-06 DIAGNOSIS — R29898 Other symptoms and signs involving the musculoskeletal system: Secondary | ICD-10-CM

## 2012-05-06 DIAGNOSIS — R259 Unspecified abnormal involuntary movements: Secondary | ICD-10-CM | POA: Insufficient documentation

## 2012-07-16 ENCOUNTER — Other Ambulatory Visit (HOSPITAL_BASED_OUTPATIENT_CLINIC_OR_DEPARTMENT_OTHER): Payer: Self-pay | Admitting: Internal Medicine

## 2012-07-18 ENCOUNTER — Other Ambulatory Visit (HOSPITAL_BASED_OUTPATIENT_CLINIC_OR_DEPARTMENT_OTHER): Payer: Self-pay | Admitting: Internal Medicine

## 2012-07-18 DIAGNOSIS — R29898 Other symptoms and signs involving the musculoskeletal system: Secondary | ICD-10-CM

## 2012-07-23 ENCOUNTER — Ambulatory Visit (HOSPITAL_COMMUNITY)
Admission: RE | Admit: 2012-07-23 | Discharge: 2012-07-23 | Disposition: A | Discharge status: Home / Self Care | Payer: Medicaid Other | Source: Ambulatory Visit | Attending: Internal Medicine | Admitting: Internal Medicine

## 2012-07-23 DIAGNOSIS — M47812 Spondylosis without myelopathy or radiculopathy, cervical region: Secondary | ICD-10-CM | POA: Insufficient documentation

## 2012-07-23 DIAGNOSIS — R29898 Other symptoms and signs involving the musculoskeletal system: Secondary | ICD-10-CM

## 2012-07-23 DIAGNOSIS — R259 Unspecified abnormal involuntary movements: Secondary | ICD-10-CM | POA: Insufficient documentation

## 2013-03-26 ENCOUNTER — Emergency Department (HOSPITAL_COMMUNITY)
Admission: EM | Admit: 2013-03-26 | Discharge: 2013-03-26 | Disposition: A | Discharge status: Home / Self Care | Payer: Medicaid Other | Attending: Emergency Medicine | Admitting: Emergency Medicine

## 2013-03-26 ENCOUNTER — Encounter (HOSPITAL_COMMUNITY): Payer: Self-pay | Admitting: *Deleted

## 2013-03-26 ENCOUNTER — Emergency Department (HOSPITAL_COMMUNITY): Payer: Medicaid Other

## 2013-03-26 DIAGNOSIS — R739 Hyperglycemia, unspecified: Secondary | ICD-10-CM

## 2013-03-26 DIAGNOSIS — F411 Generalized anxiety disorder: Secondary | ICD-10-CM | POA: Insufficient documentation

## 2013-03-26 DIAGNOSIS — Z7982 Long term (current) use of aspirin: Secondary | ICD-10-CM | POA: Insufficient documentation

## 2013-03-26 DIAGNOSIS — H539 Unspecified visual disturbance: Secondary | ICD-10-CM | POA: Insufficient documentation

## 2013-03-26 DIAGNOSIS — Z794 Long term (current) use of insulin: Secondary | ICD-10-CM | POA: Insufficient documentation

## 2013-03-26 DIAGNOSIS — R3589 Other polyuria: Secondary | ICD-10-CM | POA: Insufficient documentation

## 2013-03-26 DIAGNOSIS — E119 Type 2 diabetes mellitus without complications: Secondary | ICD-10-CM

## 2013-03-26 DIAGNOSIS — Z8781 Personal history of (healed) traumatic fracture: Secondary | ICD-10-CM | POA: Insufficient documentation

## 2013-03-26 DIAGNOSIS — R112 Nausea with vomiting, unspecified: Secondary | ICD-10-CM | POA: Insufficient documentation

## 2013-03-26 DIAGNOSIS — R358 Other polyuria: Secondary | ICD-10-CM | POA: Insufficient documentation

## 2013-03-26 DIAGNOSIS — Z79899 Other long term (current) drug therapy: Secondary | ICD-10-CM | POA: Insufficient documentation

## 2013-03-26 DIAGNOSIS — R079 Chest pain, unspecified: Secondary | ICD-10-CM | POA: Insufficient documentation

## 2013-03-26 DIAGNOSIS — Z8659 Personal history of other mental and behavioral disorders: Secondary | ICD-10-CM | POA: Insufficient documentation

## 2013-03-26 DIAGNOSIS — F17200 Nicotine dependence, unspecified, uncomplicated: Secondary | ICD-10-CM | POA: Insufficient documentation

## 2013-03-26 DIAGNOSIS — E1169 Type 2 diabetes mellitus with other specified complication: Secondary | ICD-10-CM | POA: Insufficient documentation

## 2013-03-26 DIAGNOSIS — R109 Unspecified abdominal pain: Secondary | ICD-10-CM | POA: Insufficient documentation

## 2013-03-26 DIAGNOSIS — F068 Other specified mental disorders due to known physiological condition: Secondary | ICD-10-CM | POA: Insufficient documentation

## 2013-03-26 DIAGNOSIS — R0602 Shortness of breath: Secondary | ICD-10-CM | POA: Insufficient documentation

## 2013-03-26 DIAGNOSIS — R51 Headache: Secondary | ICD-10-CM | POA: Insufficient documentation

## 2013-03-26 DIAGNOSIS — I10 Essential (primary) hypertension: Secondary | ICD-10-CM | POA: Insufficient documentation

## 2013-03-26 DIAGNOSIS — D649 Anemia, unspecified: Secondary | ICD-10-CM | POA: Insufficient documentation

## 2013-03-26 LAB — URINALYSIS, ROUTINE W REFLEX MICROSCOPIC
Ketones, ur: 15 mg/dL — AB
Leukocytes, UA: NEGATIVE
Protein, ur: 30 mg/dL — AB
Urobilinogen, UA: 1 mg/dL (ref 0.0–1.0)

## 2013-03-26 LAB — CBC WITH DIFFERENTIAL/PLATELET
Basophils Absolute: 0 10*3/uL (ref 0.0–0.1)
Basophils Relative: 0 % (ref 0–1)
Eosinophils Absolute: 0 10*3/uL (ref 0.0–0.7)
Eosinophils Relative: 0 % (ref 0–5)
Lymphocytes Relative: 24 % (ref 12–46)
MCH: 29.7 pg (ref 26.0–34.0)
MCHC: 33.5 g/dL (ref 30.0–36.0)
MCV: 88.7 fL (ref 78.0–100.0)
Monocytes Absolute: 0.5 10*3/uL (ref 0.1–1.0)
Platelets: 299 10*3/uL (ref 150–400)
RDW: 14.5 % (ref 11.5–15.5)
WBC: 11.9 10*3/uL — ABNORMAL HIGH (ref 4.0–10.5)

## 2013-03-26 LAB — BASIC METABOLIC PANEL
Calcium: 10.1 mg/dL (ref 8.4–10.5)
Creatinine, Ser: 1.53 mg/dL — ABNORMAL HIGH (ref 0.50–1.35)
GFR calc Af Amer: 57 mL/min — ABNORMAL LOW (ref >90)
GFR calc non Af Amer: 50 mL/min — ABNORMAL LOW (ref >90)
Sodium: 134 mEq/L — ABNORMAL LOW (ref 135–145)

## 2013-03-26 LAB — URINE MICROSCOPIC-ADD ON

## 2013-03-26 LAB — GLUCOSE, CAPILLARY: Glucose-Capillary: 279 mg/dL — ABNORMAL HIGH (ref 70–99)

## 2013-03-26 MED ORDER — SODIUM CHLORIDE 0.9 % IV SOLN
INTRAVENOUS | Status: DC
  Administered 2013-03-26: 18:00:00 via INTRAVENOUS

## 2013-03-26 MED ORDER — INSULIN GLARGINE 100 UNIT/ML ~~LOC~~ SOLN: 100.0000 [IU] | Freq: Every day | SUBCUTANEOUS | Status: DC

## 2013-03-26 MED ORDER — INSULIN LISPRO 100 UNIT/ML ~~LOC~~ SOLN: 37.0000 [IU] | Freq: Three times a day (TID) | SUBCUTANEOUS | Status: DC

## 2013-03-26 MED ORDER — INSULIN REGULAR BOLUS VIA INFUSION
10.0000 [IU] | Freq: Once | INTRAVENOUS | Status: DC
Start: 2013-03-26 — End: 2013-03-26

## 2013-03-26 MED ORDER — SODIUM CHLORIDE 0.9 % IV BOLUS (SEPSIS)
1000.0000 mL | Freq: Once | INTRAVENOUS | Status: AC
  Administered 2013-03-26: 1000 mL via INTRAVENOUS

## 2013-03-26 MED ORDER — METFORMIN HCL ER 750 MG PO TB24: 750.0000 mg | ORAL_TABLET | Freq: Every day | ORAL | Status: DC

## 2013-03-26 MED ORDER — INSULIN ASPART 100 UNIT/ML ~~LOC~~ SOLN
10.0000 [IU] | Freq: Once | SUBCUTANEOUS | Status: AC
  Administered 2013-03-26: 10 [IU] via INTRAVENOUS
  Filled 2013-03-26: qty 1

## 2013-03-26 NOTE — ED Provider Notes (Signed)
History    Scribed for Charles Kung, MD, the patient was seen in room APA04/APA04. This chart was scribed by Denice Bors, ED scribe. Patient's care was started at 3    CSN: [id number]  Arrival date & time 03/26/13  1244   First MD Initiated Contact with Patient 03/26/13 1646      Chief Complaint  Patient presents with  . Dizziness  . Hyperglycemia    (Consider location/radiation/quality/duration/timing/severity/associated sxs/prior treatment) HPI HPI Comments: VESTER KROUT is a 57 y.o. male who presents to the Emergency Department complaining of hyperglycemia onset this morning. Reports associated nausea, x 1 episode of emesis, epigastric pain, dizziness, headache, blurry vision, polyuria, shortness of breath, and diaphoresis. Reports constant moderate frontal headache onset 3 days. Additionally, reports waxing and waning right sided non-radiating chest pain with episodes lasting 2 minutes or less. Describes chest pain "squeezing" sensation. Denies diarrhea, fever, cough, rhinorrhea, rash, leg swelling, and dysuria. Denies any aggravating or alleviating factors. Reports he is out of all of his medications. Reports he does to the health department for PCP.   Past Medical History  Diagnosis Date  . Fracture of right foot   . Hypertension   . Diabetes mellitus   . Mental disorder     schizophrenia  . Anemia   . Chronic back pain   . Anxiety   . DEMENTIA     Nursing facility feels he has Dementia    Past Surgical History  Procedure Laterality Date  . Right foot suger    . Left 1st toe    . Orif toe fracture  09/05/2011    Procedure: OPEN REDUCTION INTERNAL FIXATION (ORIF) METATARSAL (TOE) FRACTURE;  Surgeon: Colin Rhein;  Location: [facility];  Service: Orthopedics;  Laterality: Left;  reconstruction left great toe FHB plantar plate    No family history on file.  History  Substance Use Topics  . Smoking status: Current Every Day Smoker  .  Smokeless tobacco: Not on file  . Alcohol Use: Not on file      Review of Systems  Constitutional: Positive for diaphoresis. Negative for fever.  HENT: Negative for rhinorrhea.   Eyes: Positive for visual disturbance.  Respiratory: Positive for shortness of breath.   Cardiovascular: Positive for chest pain. Negative for leg swelling.  Gastrointestinal: Positive for nausea, vomiting and abdominal pain.  Endocrine: Positive for polyuria.  Genitourinary: Negative for dysuria.  Musculoskeletal: Negative for myalgias.  Skin: Negative for rash.  Neurological: Positive for dizziness and headaches.  Hematological: Does not bruise/bleed easily.  Psychiatric/Behavioral: Negative for confusion.   A complete 10 system review of systems was obtained and all systems are negative except as noted in the HPI and PMH.    Allergies  Review of patient's allergies indicates no known allergies.  Home Medications   Current Outpatient Rx  Name  Route  Sig  Dispense  Refill  . aspirin EC 81 MG tablet   Oral   Take 81 mg by mouth daily with breakfast.         . atorvastatin (LIPITOR) 20 MG tablet   Oral   Take 20 mg by mouth every evening.         . cyclobenzaprine (FLEXERIL) 10 MG tablet   Oral   Take 10 mg by mouth daily with breakfast.          . ferrous sulfate 325 (65 FE) MG tablet   Oral   Take 325 mg by mouth 2 (  two) times daily.           . furosemide (LASIX) 40 MG tablet   Oral   Take 40 mg by mouth daily with breakfast.         . gabapentin (NEURONTIN) 600 MG tablet   Oral   Take 1,200 mg by mouth 3 (three) times daily.         . hydrochlorothiazide (HYDRODIURIL) 25 MG tablet   Oral   Take 25 mg by mouth daily with breakfast.          . insulin aspart (NOVOLOG) 100 UNIT/ML injection   Subcutaneous   Inject 25-37 Units into the skin 3 (three) times daily with meals.         . insulin glargine (LANTUS) 100 UNIT/ML injection   Subcutaneous   Inject 10-100  Units into the skin 2 (two) times daily. 10 units in the morning and 100 units at bedtime         . linagliptin (TRADJENTA) 5 MG TABS tablet   Oral   Take 5 mg by mouth daily with breakfast.         . lisinopril (PRINIVIL,ZESTRIL) 10 MG tablet   Oral   Take 10 mg by mouth daily with breakfast.          . metoprolol succinate (TOPROL-XL) 25 MG 24 hr tablet   Oral   Take 25 mg by mouth daily with breakfast.          . Paliperidone Palmitate (INVEGA SUSTENNA IM)   Intramuscular   Inject into the muscle every 30 (thirty) days. *To be administered on the 16th of each month         . potassium chloride SA (K-DUR,KLOR-CON) 20 MEQ tablet   Oral   Take 20 mEq by mouth 2 (two) times daily.         . sertraline (ZOLOFT) 100 MG tablet   Oral   Take 200 mg by mouth daily with breakfast.         . insulin glargine (LANTUS) 100 UNIT/ML injection   Subcutaneous   Inject 1 mL (100 Units total) into the skin at bedtime.   10 mL   12   . insulin lispro (HUMALOG) 100 UNIT/ML injection   Subcutaneous   Inject 37 Units into the skin 3 (three) times daily before meals.   10 mL   12   . metFORMIN (GLUCOPHAGE-XR) 750 MG 24 hr tablet   Oral   Take 1 tablet (750 mg total) by mouth daily with breakfast.   30 tablet   2     BP 116/84  Pulse 92  Temp(Src) 97.2 F (36.2 C) (Oral)  Resp 19  Ht 5\' 9"  (1.753 m)  Wt 289 lb (131.09 kg)  BMI 42.66 kg/m2  SpO2 95%  Physical Exam  Nursing note and vitals reviewed. Constitutional: He is oriented to person, place, and time. He appears well-developed and well-nourished. No distress.  HENT:  Head: Normocephalic and atraumatic.  Eyes: EOM are normal.  Neck: Neck supple. No tracheal deviation present.  Cardiovascular: Normal rate, regular rhythm and normal heart sounds.   No murmur heard. Pulmonary/Chest: Effort normal and breath sounds normal. No respiratory distress. He has no wheezes. He has no rales.  Abdominal: Soft. Bowel  sounds are normal. He exhibits no distension. There is tenderness (mild tenderness in epigastric pain ).  Musculoskeletal: Normal range of motion. He exhibits no edema.  Neurological: He is alert and oriented to  person, place, and time.  Skin: Skin is warm and dry.  Psychiatric: He has a normal mood and affect. His behavior is normal.    ED Course  Procedures (including critical care time) Medications  0.9 %  sodium chloride infusion ( Intravenous New Bag/Given 03/26/13 1818)  sodium chloride 0.9 % bolus 1,000 mL (0 mLs Intravenous Stopped 03/26/13 1940)  insulin aspart (novoLOG) injection 10 Units (10 Units Intravenous Given 03/26/13 1818)    Labs Reviewed  GLUCOSE, CAPILLARY - Abnormal; Notable for the following:    Glucose-Capillary 372 (*)    All other components within normal limits  CBC WITH DIFFERENTIAL - Abnormal; Notable for the following:    WBC 11.9 (*)    Neutro Abs 8.4 (*)    All other components within normal limits  BASIC METABOLIC PANEL - Abnormal; Notable for the following:    Sodium 134 (*)    Chloride 93 (*)    Glucose, Bld 382 (*)    Creatinine, Ser 1.53 (*)    GFR calc non Af Amer 50 (*)    GFR calc Af Amer 57 (*)    All other components within normal limits  URINALYSIS, ROUTINE W REFLEX MICROSCOPIC - Abnormal; Notable for the following:    Glucose, UA >1000 (*)    Bilirubin Urine SMALL (*)    Ketones, ur 15 (*)    Protein, ur 30 (*)    All other components within normal limits  URINE MICROSCOPIC-ADD ON - Abnormal; Notable for the following:    Casts MANY (*)    All other components within normal limits  GLUCOSE, CAPILLARY - Abnormal; Notable for the following:    Glucose-Capillary 279 (*)    All other components within normal limits  TROPONIN I   Dg Chest 2 View  03/26/2013   *RADIOLOGY REPORT*  Clinical Data: Dizziness, weakness  CHEST - 2 VIEW  Comparison: Portable chest x-ray of 08/23/2011  Findings: No active infiltrate or effusion is seen.   Mediastinal contours appear stable.  The heart is mildly enlarged and stable. There are diffuse degenerative changes throughout the thoracic spine.  IMPRESSION: No active lung disease.  Stable mild cardiomegaly.   Original Report Authenticated By: Ivar Drape, M.D.     Date: 03/26/2013  Rate: 96  Rhythm: normal sinus rhythm  QRS Axis: left  Intervals: normal  ST/T Wave abnormalities: nonspecific ST/T changes  Conduction Disutrbances:none  Narrative Interpretation:   Old EKG Reviewed: unchanged EKG is unchanged compared to 08/20/2011.  Results for orders placed during the hospital encounter of 03/26/13  GLUCOSE, CAPILLARY      Result Value Range   Glucose-Capillary 372 (*) 70 - 99 mg/dL   Comment 1 Notify RN    TROPONIN I      Result Value Range   Troponin I <0.30  <0.30 ng/mL  CBC WITH DIFFERENTIAL      Result Value Range   WBC 11.9 (*) 4.0 - 10.5 K/uL   RBC 5.32  4.22 - 5.81 MIL/uL   Hemoglobin 15.8  13.0 - 17.0 g/dL   HCT 47.2  39.0 - 52.0 %   MCV 88.7  78.0 - 100.0 fL   MCH 29.7  26.0 - 34.0 pg   MCHC 33.5  30.0 - 36.0 g/dL   RDW 14.5  11.5 - 15.5 %   Platelets 299  150 - 400 K/uL   Neutrophils Relative % 71  43 - 77 %   Neutro Abs 8.4 (*) 1.7 - 7.7 K/uL  Lymphocytes Relative 24  12 - 46 %   Lymphs Abs 2.9  0.7 - 4.0 K/uL   Monocytes Relative 5  3 - 12 %   Monocytes Absolute 0.5  0.1 - 1.0 K/uL   Eosinophils Relative 0  0 - 5 %   Eosinophils Absolute 0.0  0.0 - 0.7 K/uL   Basophils Relative 0  0 - 1 %   Basophils Absolute 0.0  0.0 - 0.1 K/uL  BASIC METABOLIC PANEL      Result Value Range   Sodium 134 (*) 135 - 145 mEq/L   Potassium 4.0  3.5 - 5.1 mEq/L   Chloride 93 (*) 96 - 112 mEq/L   CO2 23  19 - 32 mEq/L   Glucose, Bld 382 (*) 70 - 99 mg/dL   BUN 21  6 - 23 mg/dL   Creatinine, Ser 1.53 (*) 0.50 - 1.35 mg/dL   Calcium 10.1  8.4 - 10.5 mg/dL   GFR calc non Af Amer 50 (*) >90 mL/min   GFR calc Af Amer 57 (*) >90 mL/min  URINALYSIS, ROUTINE W REFLEX  MICROSCOPIC      Result Value Range   Color, Urine YELLOW  YELLOW   APPearance CLEAR  CLEAR   Specific Gravity, Urine 1.025  1.005 - 1.030   pH 5.5  5.0 - 8.0   Glucose, UA >1000 (*) NEGATIVE mg/dL   Hgb urine dipstick NEGATIVE  NEGATIVE   Bilirubin Urine SMALL (*) NEGATIVE   Ketones, ur 15 (*) NEGATIVE mg/dL   Protein, ur 30 (*) NEGATIVE mg/dL   Urobilinogen, UA 1.0  0.0 - 1.0 mg/dL   Nitrite NEGATIVE  NEGATIVE   Leukocytes, UA NEGATIVE  NEGATIVE  URINE MICROSCOPIC-ADD ON      Result Value Range   WBC, UA 0-2  <3 WBC/hpf   Bacteria, UA RARE  RARE   Casts MANY (*) NEGATIVE  GLUCOSE, CAPILLARY      Result Value Range   Glucose-Capillary 279 (*) 70 - 99 mg/dL       1. Hyperglycemia   2. Diabetes mellitus       MDM  The patient along standing history of diabetes. Workup here showed a significant hyper glycemia but no diabetic ketoacidosis. No evidence urinary tract infection. Does have some renal insufficiency no anemia. Mild leukocytosis. Patient has followup with the health department. Patient's blood sugar brought under control here with the IV insulin blood sugar is now down to 279 from the 500s. Patient's metformin and insulin regiment ordered in the form of prescriptions for him to fill tomorrow. Patient will make an appointment the health Department sometime in the next week or 2.      I personally performed the services described in this documentation, which was scribed in my presence. The recorded information has been reviewed and is accurate.      Charles Kung, MD 03/26/13 2115

## 2013-03-26 NOTE — ED Notes (Signed)
Pt states dizziness, headache and an elevated glucose this morning of 522. NAD.

## 2013-03-26 NOTE — ED Notes (Signed)
Current CBG of 372

## 2014-05-24 ENCOUNTER — Emergency Department (HOSPITAL_COMMUNITY): Payer: Medicaid Other

## 2014-05-24 ENCOUNTER — Encounter (HOSPITAL_COMMUNITY): Payer: Self-pay | Admitting: Emergency Medicine

## 2014-05-24 ENCOUNTER — Emergency Department (HOSPITAL_COMMUNITY)
Admission: EM | Admit: 2014-05-24 | Discharge: 2014-05-24 | Disposition: A | Discharge status: Home / Self Care | Payer: Medicaid Other | Attending: Emergency Medicine | Admitting: Emergency Medicine

## 2014-05-24 DIAGNOSIS — F17200 Nicotine dependence, unspecified, uncomplicated: Secondary | ICD-10-CM | POA: Insufficient documentation

## 2014-05-24 DIAGNOSIS — I10 Essential (primary) hypertension: Secondary | ICD-10-CM | POA: Insufficient documentation

## 2014-05-24 DIAGNOSIS — F411 Generalized anxiety disorder: Secondary | ICD-10-CM | POA: Insufficient documentation

## 2014-05-24 DIAGNOSIS — R059 Cough, unspecified: Secondary | ICD-10-CM | POA: Diagnosis present

## 2014-05-24 DIAGNOSIS — J069 Acute upper respiratory infection, unspecified: Secondary | ICD-10-CM | POA: Diagnosis not present

## 2014-05-24 DIAGNOSIS — G8929 Other chronic pain: Secondary | ICD-10-CM | POA: Diagnosis not present

## 2014-05-24 DIAGNOSIS — D649 Anemia, unspecified: Secondary | ICD-10-CM | POA: Diagnosis not present

## 2014-05-24 DIAGNOSIS — Z794 Long term (current) use of insulin: Secondary | ICD-10-CM | POA: Insufficient documentation

## 2014-05-24 DIAGNOSIS — Z79899 Other long term (current) drug therapy: Secondary | ICD-10-CM | POA: Diagnosis not present

## 2014-05-24 DIAGNOSIS — Z7982 Long term (current) use of aspirin: Secondary | ICD-10-CM | POA: Diagnosis not present

## 2014-05-24 DIAGNOSIS — F0390 Unspecified dementia without behavioral disturbance: Secondary | ICD-10-CM | POA: Insufficient documentation

## 2014-05-24 DIAGNOSIS — E119 Type 2 diabetes mellitus without complications: Secondary | ICD-10-CM | POA: Diagnosis not present

## 2014-05-24 DIAGNOSIS — R05 Cough: Secondary | ICD-10-CM | POA: Insufficient documentation

## 2014-05-24 DIAGNOSIS — Z8781 Personal history of (healed) traumatic fracture: Secondary | ICD-10-CM | POA: Diagnosis not present

## 2014-05-24 LAB — CBG MONITORING, ED: GLUCOSE-CAPILLARY: 128 mg/dL — AB (ref 70–99)

## 2014-05-24 MED ORDER — DEXTROMETHORPHAN-GUAIFENESIN 10-200 MG PO CAPS: 1.0000 | ORAL_CAPSULE | Freq: Three times a day (TID) | ORAL | Status: DC

## 2014-05-24 NOTE — ED Provider Notes (Signed)
Medical screening examination/treatment/procedure(s) were performed by non-physician practitioner and as supervising physician I was immediately available for consultation/collaboration.   EKG Interpretation None        Varney Biles, MD 05/24/14 2315

## 2014-05-24 NOTE — Discharge Instructions (Signed)
Upper Respiratory Infection, Adult An upper respiratory infection (URI) is also sometimes known as the common cold. The upper respiratory tract includes the nose, sinuses, throat, trachea, and bronchi. Bronchi are the airways leading to the lungs. Most people improve within 1 week, but symptoms can last up to 2 weeks. A residual cough may last even longer.  CAUSES Many different viruses can infect the tissues lining the upper respiratory tract. The tissues become irritated and inflamed and often become very moist. Mucus production is also common. A cold is contagious. You can easily spread the virus to others by oral contact. This includes kissing, sharing a glass, coughing, or sneezing. Touching your mouth or nose and then touching a surface, which is then touched by another person, can also spread the virus. SYMPTOMS  Symptoms typically develop 1 to 3 days after you come in contact with a cold virus. Symptoms vary from person to person. They may include:  Runny nose.  Sneezing.  Nasal congestion.  Sinus irritation.  Sore throat.  Loss of voice (laryngitis).  Cough.  Fatigue.  Muscle aches.  Loss of appetite.  Headache.  Low-grade fever. DIAGNOSIS  You might diagnose your own cold based on familiar symptoms, since most people get a cold 2 to 3 times a year. Your caregiver can confirm this based on your exam. Most importantly, your caregiver can check that your symptoms are not due to another disease such as strep throat, sinusitis, pneumonia, asthma, or epiglottitis. Blood tests, throat tests, and X-rays are not necessary to diagnose a common cold, but they may sometimes be helpful in excluding other more serious diseases. Your caregiver will decide if any further tests are required. RISKS AND COMPLICATIONS  You may be at risk for a more severe case of the common cold if you smoke cigarettes, have chronic heart disease (such as heart failure) or lung disease (such as asthma), or if  you have a weakened immune system. The very young and very old are also at risk for more serious infections. Bacterial sinusitis, middle ear infections, and bacterial pneumonia can complicate the common cold. The common cold can worsen asthma and chronic obstructive pulmonary disease (COPD). Sometimes, these complications can require emergency medical care and may be life-threatening. PREVENTION  The best way to protect against getting a cold is to practice good hygiene. Avoid oral or hand contact with people with cold symptoms. Wash your hands often if contact occurs. There is no clear evidence that vitamin C, vitamin E, echinacea, or exercise reduces the chance of developing a cold. However, it is always recommended to get plenty of rest and practice good nutrition. TREATMENT  Treatment is directed at relieving symptoms. There is no cure. Antibiotics are not effective, because the infection is caused by a virus, not by bacteria. Treatment may include:  Increased fluid intake. Sports drinks offer valuable electrolytes, sugars, and fluids.  Breathing heated mist or steam (vaporizer or shower).  Eating chicken soup or other clear broths, and maintaining good nutrition.  Getting plenty of rest.  Using gargles or lozenges for comfort.  Controlling fevers with ibuprofen or acetaminophen as directed by your caregiver.  Increasing usage of your inhaler if you have asthma. Zinc gel and zinc lozenges, taken in the first 24 hours of the common cold, can shorten the duration and lessen the severity of symptoms. Pain medicines may help with fever, muscle aches, and throat pain. A variety of non-prescription medicines are available to treat congestion and runny nose. Your caregiver  can make recommendations and may suggest nasal or lung inhalers for other symptoms.  HOME CARE INSTRUCTIONS   Only take over-the-counter or prescription medicines for pain, discomfort, or fever as directed by your  caregiver.  Use a warm mist humidifier or inhale steam from a shower to increase air moisture. This may keep secretions moist and make it easier to breathe.  Drink enough water and fluids to keep your urine clear or pale yellow.  Rest as needed.  Return to work when your temperature has returned to normal or as your caregiver advises. You may need to stay home longer to avoid infecting others. You can also use a face mask and careful hand washing to prevent spread of the virus. SEEK MEDICAL CARE IF:   After the first few days, you feel you are getting worse rather than better.  You need your caregiver's advice about medicines to control symptoms.  You develop chills, worsening shortness of breath, or brown or red sputum. These may be signs of pneumonia.  You develop yellow or brown nasal discharge or pain in the face, especially when you bend forward. These may be signs of sinusitis.  You develop a fever, swollen neck glands, pain with swallowing, or white areas in the back of your throat. These may be signs of strep throat. SEEK IMMEDIATE MEDICAL CARE IF:   You have a fever.  You develop severe or persistent headache, ear pain, sinus pain, or chest pain.  You develop wheezing, a prolonged cough, cough up blood, or have a change in your usual mucus (if you have chronic lung disease).  You develop sore muscles or a stiff neck. Document Released: 04/11/2001 Document Revised: 01/08/2012 Document Reviewed: 01/21/2014 [facility] Patient Information 2015 [place], [place]. This information is not intended to replace advice given to you by your health care provider. Make sure you discuss any questions you have with your health care provider.  Cough, Adult  A cough is a reflex that helps clear your throat and airways. It can help heal the body or may be a reaction to an irritated airway. A cough may only last 2 or 3 weeks (acute) or may last more than 8 weeks (chronic).  CAUSES Acute  cough:  Viral or bacterial infections. Chronic cough:  Infections.  Allergies.  Asthma.  Post-nasal drip.  Smoking.  Heartburn or acid reflux.  Some medicines.  Chronic lung problems (COPD).  Cancer. SYMPTOMS   Cough.  Fever.  Chest pain.  Increased breathing rate.  High-pitched whistling sound when breathing (wheezing).  Colored mucus that you cough up (sputum). TREATMENT   A bacterial cough may be treated with antibiotic medicine.  A viral cough must run its course and will not respond to antibiotics.  Your caregiver may recommend other treatments if you have a chronic cough. HOME CARE INSTRUCTIONS   Only take over-the-counter or prescription medicines for pain, discomfort, or fever as directed by your caregiver. Use cough suppressants only as directed by your caregiver.  Use a cold steam vaporizer or humidifier in your bedroom or home to help loosen secretions.  Sleep in a semi-upright position if your cough is worse at night.  Rest as needed.  Stop smoking if you smoke. SEEK IMMEDIATE MEDICAL CARE IF:   You have pus in your sputum.  Your cough starts to worsen.  You cannot control your cough with suppressants and are losing sleep.  You begin coughing up blood.  You have difficulty breathing.  You develop pain which is  getting worse or is uncontrolled with medicine.  You have a fever. MAKE SURE YOU:   Understand these instructions.  Will watch your condition.  Will get help right away if you are not doing well or get worse. Document Released: 04/14/2011 Document Revised: 01/08/2012 Document Reviewed: 04/14/2011 [facility] Patient Information 2015 [place], [place]. This information is not intended to replace advice given to you by your health care provider. Make sure you discuss any questions you have with your health care provider.  Your chest x-ray is normal, you can safely take over-the-counter medications, such as Delsym or Robitussin  for your nonproductive cough

## 2014-05-24 NOTE — ED Provider Notes (Signed)
CSN: [id number]     Arrival date & time 05/24/14  0046 History   First MD Initiated Contact with Patient 05/24/14 [phone]     Chief Complaint  Patient presents with  . Cough     (Consider location/radiation/quality/duration/timing/severity/associated sxs/prior Treatment) HPI Comments: This is a 58 year old, African American male with a one-week history of URI, symptoms.  States he is an occasional cough.  That is keeping him from sleep.  Cough causes his rib area to be sore  Patient is a 58 y.o. male presenting with cough. The history is provided by the patient.  Cough Cough characteristics:  Non-productive Onset quality:  Gradual Duration:  7 days Timing:  Intermittent Progression:  Unchanged Chronicity:  New Relieved by:  None tried Worsened by:  Lying down Ineffective treatments:  None tried Associated symptoms: rhinorrhea and sinus congestion   Associated symptoms: no chest pain, no chills, no shortness of breath, no sore throat and no wheezing     Past Medical History  Diagnosis Date  . Fracture of right foot   . Hypertension   . Diabetes mellitus   . Mental disorder     schizophrenia  . Anemia   . Chronic back pain   . Anxiety   . DEMENTIA     Nursing facility feels he has Dementia   Past Surgical History  Procedure Laterality Date  . Right foot suger    . Left 1st toe    . Orif toe fracture  09/05/2011    Procedure: OPEN REDUCTION INTERNAL FIXATION (ORIF) METATARSAL (TOE) FRACTURE;  Surgeon: Colin Rhein;  Location: [facility];  Service: Orthopedics;  Laterality: Left;  reconstruction left great toe FHB plantar plate   No family history on file. History  Substance Use Topics  . Smoking status: Current Every Day Smoker  . Smokeless tobacco: Not on file  . Alcohol Use: 12.6 oz/week    21 Cans of beer per week    Review of Systems  Constitutional: Negative for chills.  HENT: Positive for rhinorrhea. Negative for sore throat.   Respiratory:  Positive for cough. Negative for shortness of breath and wheezing.   Cardiovascular: Negative for chest pain.  All other systems reviewed and are negative.     Allergies  Review of patient's allergies indicates no known allergies.  Home Medications   Prior to Admission medications   Medication Sig Start Date End Date Taking? Authorizing Provider  aspirin EC 81 MG tablet Take 81 mg by mouth daily with breakfast.   Yes Historical Provider, MD  atorvastatin (LIPITOR) 20 MG tablet Take 20 mg by mouth every evening.   Yes Historical Provider, MD  cyclobenzaprine (FLEXERIL) 10 MG tablet Take 10 mg by mouth daily with breakfast.    Yes Historical Provider, MD  ferrous sulfate 325 (65 FE) MG tablet Take 325 mg by mouth 2 (two) times daily.     Yes Historical Provider, MD  furosemide (LASIX) 40 MG tablet Take 40 mg by mouth daily with breakfast.   Yes Historical Provider, MD  gabapentin (NEURONTIN) 600 MG tablet Take 1,200 mg by mouth 3 (three) times daily.   Yes Historical Provider, MD  insulin detemir (LEVEMIR) 100 UNIT/ML injection Inject 50 Units into the skin at bedtime.   Yes Historical Provider, MD  insulin lispro (HUMALOG) 100 UNIT/ML injection Inject 45 Units into the skin 3 (three) times daily before meals.   Yes Historical Provider, MD  lisinopril (PRINIVIL,ZESTRIL) 10 MG tablet Take 10 mg by  mouth daily with breakfast.    Yes Historical Provider, MD  metoprolol succinate (TOPROL-XL) 25 MG 24 hr tablet Take 25 mg by mouth daily with breakfast.    Yes Historical Provider, MD  potassium chloride SA (K-DUR,KLOR-CON) 20 MEQ tablet Take 20 mEq by mouth 2 (two) times daily.   Yes Historical Provider, MD  Dextromethorphan-Guaifenesin (CORICIDIN HBP CONGESTION/COUGH) 10-200 MG CAPS Take 1 tablet by mouth 3 (three) times daily. 05/24/14   Garald Balding, NP  linagliptin (TRADJENTA) 5 MG TABS tablet Take 5 mg by mouth daily with breakfast.    Historical Provider, MD  sertraline (ZOLOFT) 100 MG  tablet Take 200 mg by mouth daily with breakfast.    Historical Provider, MD   BP 135/74  Pulse 100  Temp(Src) 98.8 F (37.1 C) (Oral)  Resp 20  Wt 234 lb (106.142 kg)  SpO2 98% Physical Exam  Nursing note and vitals reviewed. Constitutional: He appears well-developed and well-nourished.  HENT:  Head: Normocephalic.  Right Ear: External ear normal.  Eyes: Pupils are equal, round, and reactive to light.  Neck: Normal range of motion.  Cardiovascular: Normal rate.   Pulmonary/Chest: Effort normal and breath sounds normal. No respiratory distress. He has no wheezes.  Abdominal: Soft.  Musculoskeletal: Normal range of motion.  Neurological: He is alert.  Skin: Skin is warm. No rash noted.    ED Course  Procedures (including critical care time) Labs Review Labs Reviewed  CBG MONITORING, ED - Abnormal; Notable for the following:    Glucose-Capillary 128 (*)    All other components within normal limits    Imaging Review Dg Chest 2 View  05/24/2014   CLINICAL DATA:  Chest congestion with dry cough and shortness of breath.  EXAM: CHEST  2 VIEW  COMPARISON:  03/26/2013  FINDINGS: The lungs are clear without focal infiltrate, edema, pneumothorax or pleural effusion. Interstitial markings are diffusely coarsened with chronic features. The cardiopericardial silhouette is within normal limits for size. Imaged bony structures of the thorax are intact.  IMPRESSION: Stable.  No acute cardiopulmonary findings.   Electronically Signed   By: Misty Stanley M.D.   On: 05/24/2014 02:01     EKG Interpretation None      MDM  Patient's chest x-ray is normal.  He is being discharged with a diagnosis of URI to to his cardiac medications and diabetes.  Medications.  He can take Coricidin HBP for his symptoms Final diagnoses:  URI (upper respiratory infection)        Garald Balding, NP 05/24/14 [phone]

## 2014-05-24 NOTE — ED Notes (Signed)
Pt c/o cough, congested but non productive x 1 week, nasal drainage, side/rib pain. +nausea.

## 2015-04-30 ENCOUNTER — Encounter: Payer: Self-pay | Admitting: Gastroenterology

## 2015-05-25 ENCOUNTER — Ambulatory Visit (INDEPENDENT_AMBULATORY_CARE_PROVIDER_SITE_OTHER): Payer: Medicaid Other | Admitting: Nurse Practitioner

## 2015-05-25 ENCOUNTER — Encounter: Payer: Self-pay | Admitting: Nurse Practitioner

## 2015-05-25 VITALS — BP 128/75 | HR 94 | Temp 97.6°F | Ht 72.0 in | Wt 215.2 lb

## 2015-05-25 DIAGNOSIS — B182 Chronic viral hepatitis C: Secondary | ICD-10-CM | POA: Diagnosis not present

## 2015-05-25 DIAGNOSIS — B1920 Unspecified viral hepatitis C without hepatic coma: Secondary | ICD-10-CM | POA: Insufficient documentation

## 2015-05-25 LAB — CBC WITH DIFFERENTIAL/PLATELET
BASOS PCT: 0 % (ref 0–1)
Basophils Absolute: 0 10*3/uL (ref 0.0–0.1)
EOS ABS: 0.1 10*3/uL (ref 0.0–0.7)
EOS PCT: 1 % (ref 0–5)
HEMATOCRIT: 42.3 % (ref 39.0–52.0)
HEMOGLOBIN: 14.4 g/dL (ref 13.0–17.0)
LYMPHS PCT: 33 % (ref 12–46)
Lymphs Abs: 1.9 10*3/uL (ref 0.7–4.0)
MCH: 31.9 pg (ref 26.0–34.0)
MCHC: 34 g/dL (ref 30.0–36.0)
MCV: 93.6 fL (ref 78.0–100.0)
MPV: 10 fL (ref 8.6–12.4)
Monocytes Absolute: 0.4 10*3/uL (ref 0.1–1.0)
Monocytes Relative: 7 % (ref 3–12)
Neutro Abs: 3.5 10*3/uL (ref 1.7–7.7)
Neutrophils Relative %: 59 % (ref 43–77)
Platelets: 274 10*3/uL (ref 150–400)
RBC: 4.52 MIL/uL (ref 4.22–5.81)
RDW: 14.6 % (ref 11.5–15.5)
WBC: 5.9 10*3/uL (ref 4.0–10.5)

## 2015-05-25 LAB — COMPREHENSIVE METABOLIC PANEL
ALT: 50 U/L — AB (ref 9–46)
AST: 39 U/L — ABNORMAL HIGH (ref 10–35)
Albumin: 4.1 g/dL (ref 3.6–5.1)
Alkaline Phosphatase: 84 U/L (ref 40–115)
BILIRUBIN TOTAL: 0.5 mg/dL (ref 0.2–1.2)
BUN: 11 mg/dL (ref 7–25)
CHLORIDE: 108 meq/L (ref 98–110)
CO2: 24 mEq/L (ref 20–31)
Calcium: 9.4 mg/dL (ref 8.6–10.3)
Creat: 0.96 mg/dL (ref 0.70–1.33)
GLUCOSE: 132 mg/dL — AB (ref 65–99)
Potassium: 4.1 mEq/L (ref 3.5–5.3)
Sodium: 141 mEq/L (ref 135–146)
Total Protein: 7.5 g/dL (ref 6.1–8.1)

## 2015-05-25 LAB — PROTIME-INR
INR: 1.22 (ref <1.50)
Prothrombin Time: 15.4 seconds — ABNORMAL HIGH (ref 11.6–15.2)

## 2015-05-25 NOTE — Progress Notes (Signed)
Primary Care Physician:  Rosita Fire, MD Primary Gastroenterologist:  Dr. Oneida Alar  Chief Complaint  Patient presents with  . Hepatitis C    HPI:   59 year old male referred by PCP for hepatitis C. PCP notes reviewed. Hep C testing completed on 04/13/2015 which had positive hepatitis C antibody, positive HCV RNA Quant with a log of 6.05. No hepatitis B testing or HCV genotype testing completed as of yet. Last CBC in May of this year shows platelet count of 299. Being that she is creatinine 1.53, no liver enzymes checked. Last CT of the abdomen and pelvis completed on 08/20/2011 which found liver with low attenuating compatible with fatty change, no focal liver lesion.  Today he states this is a new diagnosis. Admits right flank pain but denies other abdominal pain. Denies N/V, yellowing of skin and eyes, darkened urine, LE edema, abdominal swelling, dyspnea, overt confusion. Denies fever, chills. Has lost some weight but has been doing a lot of walking. Denies hematochezia, melena, changes in bowel habits. Has a bowel movement about 1-2 times a day or as infrequent as every 2 days. Denies chest pain, dyspnea, dizziness, lightheadedness, syncope, near syncope. Denies any other upper or lower GI symptoms.  Hepatitis C Risk Factors:  Birth cohort ([place]): Yes IV drug use: No Tattoos: No Blood product transfusion: No HC worker: No Hemodialysis: No Maternal infection: Unknown    Past Medical History  Diagnosis Date  . Fracture of right foot   . Hypertension   . Diabetes mellitus   . Mental disorder     schizophrenia  . Anemia   . Chronic back pain   . Anxiety   . DEMENTIA     Nursing facility feels he has Dementia    Past Surgical History  Procedure Laterality Date  . Right foot suger    . Left 1st toe    . Orif toe fracture  09/05/2011    Procedure: OPEN REDUCTION INTERNAL FIXATION (ORIF) METATARSAL (TOE) FRACTURE;  Surgeon: Colin Rhein;  Location: [facility];  Service: Orthopedics;  Laterality: Left;  reconstruction left great toe FHB plantar plate    Current Outpatient Prescriptions  Medication Sig Dispense Refill  . aspirin EC 81 MG tablet Take 81 mg by mouth daily with breakfast.    . atorvastatin (LIPITOR) 20 MG tablet Take 20 mg by mouth every evening.    . ferrous sulfate 325 (65 FE) MG tablet Take 325 mg by mouth 2 (two) times daily.      . furosemide (LASIX) 40 MG tablet Take 40 mg by mouth daily with breakfast.    . gabapentin (NEURONTIN) 600 MG tablet Take 1,200 mg by mouth 3 (three) times daily.    . insulin detemir (LEVEMIR) 100 UNIT/ML injection Inject 70 Units into the skin at bedtime.     . insulin lispro (HUMALOG) 100 UNIT/ML injection Inject 45 Units into the skin 3 (three) times daily before meals.     No current facility-administered medications for this visit.    Allergies as of 05/25/2015  . (No Known Allergies)    Family History  Problem Relation Age of Onset  . Colon cancer Neg Hx   . Liver disease Neg Hx     unknown for sure, mom died at age 64    History   Social History  . Marital Status: Single    Spouse Name: N/A  . Number of Children: N/A  . Years of Education: N/A  Occupational History  . Not on file.   Social History Main Topics  . Smoking status: Current Every Day Smoker -- 0.33 packs/day    Types: Cigarettes  . Smokeless tobacco: Never Used  . Alcohol Use: 12.6 oz/week    21 Cans of beer per week     Comment: Drinks beer currnently 1-2 forty ounce beers a day; Many years ago drank 3-4 forty ounces a day  . Drug Use: No  . Sexual Activity: Not on file   Other Topics Concern  . Not on file   Social History Narrative    Review of Systems: General: Negative for anorexia, weight loss, fever, chills, fatigue, weakness. Eyes: Negative for vision changes.  ENT: Negative for hoarseness, difficulty swallowing. CV: Negative for chest pain, angina, palpitations, dyspnea on  exertion, peripheral edema.  Respiratory: Negative for dyspnea at rest, dyspnea on exertion, cough, sputum, wheezing.  GI: See history of present illness. Derm: Negative for rash or itching.  Endo: Negative for unusual weight change.  Heme: Negative for bruising or bleeding. Allergy: Negative for rash or hives.    Physical Exam: BP 128/75 mmHg  Pulse 94  Temp(Src) 97.6 F (36.4 C) (Oral)  Ht 6' (1.829 m)  Wt 215 lb 3.2 oz (97.614 kg)  BMI 29.18 kg/m2 General:   Alert and oriented. Pleasant and cooperative. Well-nourished and well-developed.  Head:  Normocephalic and atraumatic. Eyes:  Without icterus, sclera clear and conjunctiva pink.  Ears:  Normal auditory acuity. Cardiovascular:  S1, S2 present without murmurs appreciated. Normal pulses noted. Extremities without clubbing or edema. Respiratory:  Clear to auscultation bilaterally. No wheezes, rales, or rhonchi. No distress.  Gastrointestinal:  +BS, rounded soft, non-tender and non-distended. No HSM noted. No guarding or rebound. No masses appreciated.  Rectal:  Deferred  Neurologic:  Alert and oriented x4;  grossly normal neurologically. Psych:  Alert and cooperative. Normal mood and affect. Heme/Lymph/Immune: No excessive bruising noted.    05/25/2015 9:57 AM

## 2015-05-25 NOTE — Patient Instructions (Signed)
1. Have your labs drawn when you're able. 2. We will schedule your ultrasound appointment for you. 3. Return for evaluation in 2 months. 4. Cut back/abstain from alcohol consumption.

## 2015-05-26 LAB — HEPATITIS B SURFACE ANTIGEN: Hepatitis B Surface Ag: NEGATIVE

## 2015-05-28 NOTE — Assessment & Plan Note (Signed)
59 year old male with positive hep C antibody high risk due to birth cohort. Currently asymptomatic. RNA quant positive [id number] on 04/16/15, no genotype completed yet. At this point we will begin workup for possible treatment options incluging hep BsAg, CBC, CMP, PT/INR, and HCV genotyping. Will order [place] elastography to evaluate for fibrosis. Questionable candidacy for treatment sue to current ETOH use 40-80 oz beer a day. Encouraged reduction and abstinence of ETOH. Denies drug use. Will have him return in 2 months to review results and make further plans.

## 2015-06-01 NOTE — Progress Notes (Signed)
CC'ED TO PCP 

## 2015-06-03 ENCOUNTER — Ambulatory Visit (HOSPITAL_COMMUNITY): Payer: Medicaid Other

## 2015-06-03 LAB — HEPATITIS C GENOTYPE: HCV Genotype: 2

## 2015-06-10 ENCOUNTER — Ambulatory Visit (HOSPITAL_COMMUNITY): Payer: No Typology Code available for payment source

## 2015-06-17 ENCOUNTER — Ambulatory Visit (HOSPITAL_COMMUNITY)
Admission: RE | Admit: 2015-06-17 | Discharge: 2015-06-17 | Disposition: A | Discharge status: Home / Self Care | Payer: Medicaid Other | Source: Ambulatory Visit | Attending: Nurse Practitioner | Admitting: Nurse Practitioner

## 2015-06-17 ENCOUNTER — Telehealth: Payer: Self-pay | Admitting: Gastroenterology

## 2015-06-17 DIAGNOSIS — Z720 Tobacco use: Secondary | ICD-10-CM | POA: Diagnosis not present

## 2015-06-17 DIAGNOSIS — B1920 Unspecified viral hepatitis C without hepatic coma: Secondary | ICD-10-CM | POA: Diagnosis present

## 2015-06-17 DIAGNOSIS — R935 Abnormal findings on diagnostic imaging of other abdominal regions, including retroperitoneum: Secondary | ICD-10-CM | POA: Diagnosis not present

## 2015-06-17 DIAGNOSIS — B182 Chronic viral hepatitis C: Secondary | ICD-10-CM

## 2015-06-17 NOTE — Telephone Encounter (Signed)
See note on labs of 05/25/2015. Pt could not be reached earlier. I have entered his new phone number of [phone] and his new address of [place].  He is aware of those results and wants to be referred to Dr. Olevia Perches office.

## 2015-06-17 NOTE — Telephone Encounter (Signed)
PATIENT CALLED WANTING RESULTS FROM LAB WORK [phone]

## 2015-06-25 ENCOUNTER — Emergency Department (HOSPITAL_COMMUNITY): Payer: No Typology Code available for payment source

## 2015-06-25 ENCOUNTER — Encounter (HOSPITAL_COMMUNITY): Payer: Self-pay | Admitting: *Deleted

## 2015-06-25 ENCOUNTER — Emergency Department (HOSPITAL_COMMUNITY)
Admission: EM | Admit: 2015-06-25 | Discharge: 2015-06-25 | Disposition: A | Discharge status: Home / Self Care | Payer: No Typology Code available for payment source | Attending: Emergency Medicine | Admitting: Emergency Medicine

## 2015-06-25 DIAGNOSIS — D649 Anemia, unspecified: Secondary | ICD-10-CM | POA: Insufficient documentation

## 2015-06-25 DIAGNOSIS — G8929 Other chronic pain: Secondary | ICD-10-CM | POA: Insufficient documentation

## 2015-06-25 DIAGNOSIS — I10 Essential (primary) hypertension: Secondary | ICD-10-CM | POA: Diagnosis not present

## 2015-06-25 DIAGNOSIS — E119 Type 2 diabetes mellitus without complications: Secondary | ICD-10-CM | POA: Diagnosis not present

## 2015-06-25 DIAGNOSIS — Y9389 Activity, other specified: Secondary | ICD-10-CM | POA: Diagnosis not present

## 2015-06-25 DIAGNOSIS — F419 Anxiety disorder, unspecified: Secondary | ICD-10-CM | POA: Insufficient documentation

## 2015-06-25 DIAGNOSIS — Z794 Long term (current) use of insulin: Secondary | ICD-10-CM | POA: Insufficient documentation

## 2015-06-25 DIAGNOSIS — Z8659 Personal history of other mental and behavioral disorders: Secondary | ICD-10-CM | POA: Diagnosis not present

## 2015-06-25 DIAGNOSIS — S99911A Unspecified injury of right ankle, initial encounter: Secondary | ICD-10-CM | POA: Diagnosis present

## 2015-06-25 DIAGNOSIS — Z720 Tobacco use: Secondary | ICD-10-CM | POA: Diagnosis not present

## 2015-06-25 DIAGNOSIS — Z8781 Personal history of (healed) traumatic fracture: Secondary | ICD-10-CM | POA: Diagnosis not present

## 2015-06-25 DIAGNOSIS — Y92410 Unspecified street and highway as the place of occurrence of the external cause: Secondary | ICD-10-CM | POA: Insufficient documentation

## 2015-06-25 DIAGNOSIS — Y998 Other external cause status: Secondary | ICD-10-CM | POA: Insufficient documentation

## 2015-06-25 DIAGNOSIS — Z79899 Other long term (current) drug therapy: Secondary | ICD-10-CM | POA: Diagnosis not present

## 2015-06-25 DIAGNOSIS — M25571 Pain in right ankle and joints of right foot: Secondary | ICD-10-CM

## 2015-06-25 DIAGNOSIS — Z7982 Long term (current) use of aspirin: Secondary | ICD-10-CM | POA: Diagnosis not present

## 2015-06-25 LAB — CBG MONITORING, ED: Glucose-Capillary: 101 mg/dL — ABNORMAL HIGH (ref 65–99)

## 2015-06-25 MED ORDER — NAPROXEN 500 MG PO TABS: 500.0000 mg | ORAL_TABLET | Freq: Two times a day (BID) | ORAL | Status: DC
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
## 2015-06-25 NOTE — ED Notes (Signed)
cbg in triage 101

## 2015-07-08 ENCOUNTER — Encounter (INDEPENDENT_AMBULATORY_CARE_PROVIDER_SITE_OTHER): Payer: Self-pay | Admitting: Internal Medicine

## 2015-07-08 ENCOUNTER — Ambulatory Visit (INDEPENDENT_AMBULATORY_CARE_PROVIDER_SITE_OTHER): Payer: Medicaid Other | Admitting: Internal Medicine

## 2015-07-08 DIAGNOSIS — B1920 Unspecified viral hepatitis C without hepatic coma: Secondary | ICD-10-CM

## 2015-07-08 DIAGNOSIS — E119 Type 2 diabetes mellitus without complications: Secondary | ICD-10-CM | POA: Insufficient documentation

## 2015-07-08 DIAGNOSIS — E7800 Pure hypercholesterolemia, unspecified: Secondary | ICD-10-CM | POA: Insufficient documentation

## 2015-07-08 LAB — CBC WITH DIFFERENTIAL/PLATELET
Basophils Absolute: 0 10*3/uL (ref 0.0–0.1)
Basophils Relative: 0 % (ref 0–1)
EOS ABS: 0.1 10*3/uL (ref 0.0–0.7)
EOS PCT: 1 % (ref 0–5)
HEMATOCRIT: 44.4 % (ref 39.0–52.0)
Hemoglobin: 15.2 g/dL (ref 13.0–17.0)
LYMPHS ABS: 2.1 10*3/uL (ref 0.7–4.0)
LYMPHS PCT: 38 % (ref 12–46)
MCH: 32.6 pg (ref 26.0–34.0)
MCHC: 34.2 g/dL (ref 30.0–36.0)
MCV: 95.3 fL (ref 78.0–100.0)
MONO ABS: 0.4 10*3/uL (ref 0.1–1.0)
MPV: 9.4 fL (ref 8.6–12.4)
Monocytes Relative: 8 % (ref 3–12)
Neutro Abs: 2.9 10*3/uL (ref 1.7–7.7)
Neutrophils Relative %: 53 % (ref 43–77)
PLATELETS: 239 10*3/uL (ref 150–400)
RBC: 4.66 MIL/uL (ref 4.22–5.81)
RDW: 13.7 % (ref 11.5–15.5)
WBC: 5.5 10*3/uL (ref 4.0–10.5)

## 2015-07-08 LAB — HEPATIC FUNCTION PANEL
ALT: 93 U/L — AB (ref 9–46)
AST: 64 U/L — AB (ref 10–35)
Albumin: 4.1 g/dL (ref 3.6–5.1)
Alkaline Phosphatase: 99 U/L (ref 40–115)
BILIRUBIN DIRECT: 0.1 mg/dL (ref <=0.2)
BILIRUBIN INDIRECT: 0.4 mg/dL (ref 0.2–1.2)
TOTAL PROTEIN: 7.7 g/dL (ref 6.1–8.1)
Total Bilirubin: 0.5 mg/dL (ref 0.2–1.2)

## 2015-07-08 NOTE — Progress Notes (Signed)
Subjective:    Patient ID: Charles [name], male    DOB: 03-23-1956, 59 y.o.   MRN: [id number]  HPI Referred to our office by [facility] Walden Field NP for treatment of Hepatitis C. Diagnosed in June of this year. Patient is a Genotype 2.  Appetite is good. No weight loss. No dysphagia.  Denies any abdominal pain. Usually has a BM daily or ever other day. No melena or BRRB. 05/2015 [place] elastrography: Corresponding Metavir fibrosis score: F2/F3   Have you ever been treated for Hepatitis C? no Any hx of IV drug abuse or drug abuse? no Are you drinking now? Usually a 40 oz beer a day.  Any hx of etoh abuse? no Do you have tattoos? no Have you ever received a blood transfusion? no When were you diagnosed with Hepatitis C? June of 2016 Any hx of mental illness requiring treatment? no Do you have suicidal thoughts? no    Hepatic Function Panel     Component Value Date/Time   PROT 7.5 05/25/2015 1018   ALBUMIN 4.1 05/25/2015 1018   AST 39* 05/25/2015 1018   ALT 50* 05/25/2015 1018   ALKPHOS 84 05/25/2015 1018   BILITOT 0.5 05/25/2015 1018    CBC    Component Value Date/Time   WBC 5.9 05/25/2015 1018   RBC 4.52 05/25/2015 1018   HGB 14.4 05/25/2015 1018   HCT 42.3 05/25/2015 1018   PLT 274 05/25/2015 1018   MCV 93.6 05/25/2015 1018   MCH 31.9 05/25/2015 1018   MCHC 34.0 05/25/2015 1018   RDW 14.6 05/25/2015 1018   LYMPHSABS 1.9 05/25/2015 1018   MONOABS 0.4 05/25/2015 1018   EOSABS 0.1 05/25/2015 1018   BASOSABS 0.0 05/25/2015 1018   7/26/2016PT/INR: 15.4 and 1.22, Hep B surface antigen Negative.         Review of Systems Past Medical History  Diagnosis Date  . Fracture of right foot   . Hypertension   . Diabetes mellitus   . Mental disorder     schizophrenia  . Anemia   . Chronic back pain   . Anxiety   . DEMENTIA     Nursing facility feels he has Dementia    Past Surgical History  Procedure Laterality Date  . Right foot suger    . Left 1st toe    .  Orif toe fracture  09/05/2011    Procedure: OPEN REDUCTION INTERNAL FIXATION (ORIF) METATARSAL (TOE) FRACTURE;  Surgeon: Colin Rhein;  Location: [facility];  Service: Orthopedics;  Laterality: Left;  reconstruction left great toe FHB plantar plate    No Known Allergies  Current Outpatient Prescriptions on File Prior to Visit  Medication Sig Dispense Refill  . aspirin EC 81 MG tablet Take 81 mg by mouth daily with breakfast.    . atorvastatin (LIPITOR) 20 MG tablet Take 20 mg by mouth every evening.    . ferrous sulfate 325 (65 FE) MG tablet Take 325 mg by mouth 2 (two) times daily.      . furosemide (LASIX) 40 MG tablet Take 40 mg by mouth daily with breakfast.    . gabapentin (NEURONTIN) 600 MG tablet Take 1,200 mg by mouth 3 (three) times daily.    . insulin detemir (LEVEMIR) 100 UNIT/ML injection Inject 70 Units into the skin at bedtime.     . insulin lispro (HUMALOG) 100 UNIT/ML injection Inject 45 Units into the skin 3 (three) times daily before meals.    . naproxen (  NAPROSYN) 500 MG tablet Take 1 tablet (500 mg total) by mouth 2 (two) times daily. 30 tablet 0   No current facility-administered medications on file prior to visit.        Objective:   Physical Exam Blood pressure 150/80, pulse 76, temperature 97.7 F (36.5 C), height 6' (1.829 m), weight 214 lb 9.6 oz (97.342 kg).  Alert and oriented. Skin warm and dry. Oral mucosa is moist.   . Sclera anicteric, conjunctivae is pink. Thyroid not enlarged. No cervical lymphadenopathy. Lungs clear. Heart regular rate and rhythm.  Abdomen is soft. Bowel sounds are positive. No hepatomegaly. No abdominal masses felt. No tenderness.  No edema to lower extremities.         Assessment & Plan:  Hepatitis C. Will get a Hep C quain, Urine drug screen. If drug screen okay, will treat with Epclusa or Solvaldi and Ribavirin 1200mg  x 12 weeks.

## 2015-07-08 NOTE — Patient Instructions (Signed)
OV pending.  

## 2015-07-09 LAB — DRUG SCREEN, URINE
Amphetamine Screen, Ur: NEGATIVE
BARBITURATE QUANT UR: NEGATIVE
Benzodiazepines.: NEGATIVE
Cocaine Metabolites: NEGATIVE
Creatinine,U: 147.87 mg/dL
MARIJUANA METABOLITE: NEGATIVE
METHADONE: NEGATIVE
OPIATES: NEGATIVE
PROPOXYPHENE: NEGATIVE
Phencyclidine (PCP): NEGATIVE

## 2015-07-11 LAB — HEPATITIS C RNA QUANTITATIVE
HCV QUANT: 1313461 [IU]/mL — AB (ref <15)
HCV Quantitative Log: 6.12 {Log} — ABNORMAL HIGH (ref <1.18)

## 2015-07-12 ENCOUNTER — Telehealth (INDEPENDENT_AMBULATORY_CARE_PROVIDER_SITE_OTHER): Payer: Self-pay | Admitting: Internal Medicine

## 2015-07-12 NOTE — Telephone Encounter (Signed)
Tammy will treat either with Salvaldi and ribaviring 1200mg  x 12 weeks or Epclusa x 12 weeks.

## 2015-07-14 NOTE — Telephone Encounter (Signed)
Paper work has been started on behalf of this patient , PA has been sent to [employer] for the continuation of the PA. Once we have heard from [employer] , we will let the patient know the status.

## 2015-07-26 ENCOUNTER — Ambulatory Visit (INDEPENDENT_AMBULATORY_CARE_PROVIDER_SITE_OTHER): Payer: Self-pay | Admitting: Nurse Practitioner

## 2015-07-26 ENCOUNTER — Encounter: Payer: Self-pay | Admitting: Nurse Practitioner

## 2015-07-26 VITALS — BP 150/89 | HR 83 | Temp 97.5°F | Ht 72.0 in | Wt 220.0 lb

## 2015-07-26 DIAGNOSIS — B182 Chronic viral hepatitis C: Secondary | ICD-10-CM

## 2015-07-29 NOTE — Progress Notes (Signed)
Opened in error, appointment cancelled.

## 2015-10-11 ENCOUNTER — Encounter (INDEPENDENT_AMBULATORY_CARE_PROVIDER_SITE_OTHER): Payer: Self-pay | Admitting: *Deleted

## 2015-10-11 ENCOUNTER — Telehealth (INDEPENDENT_AMBULATORY_CARE_PROVIDER_SITE_OTHER): Payer: Self-pay | Admitting: *Deleted

## 2015-10-11 NOTE — Telephone Encounter (Signed)
Our office rec'd a call from [facility]. She states that they have been unable to reach the patient to arrange shipment of his medication. I have tried numerous times and only get the patient's voicemail. A letter is going to be sent to him. [employer] was called back and made aware of the status.

## 2015-10-21 ENCOUNTER — Telehealth (INDEPENDENT_AMBULATORY_CARE_PROVIDER_SITE_OTHER): Payer: Self-pay | Admitting: *Deleted

## 2015-10-21 NOTE — Telephone Encounter (Signed)
Patient will be picking up his Hep C medication at our office. He picked it up on 10/18/15 and he stated that he was going to start the Ribavirin that evening and would start the [facility] on 10/19/15.

## 2015-10-21 NOTE — Telephone Encounter (Signed)
Noted  

## 2015-10-26 ENCOUNTER — Telehealth (INDEPENDENT_AMBULATORY_CARE_PROVIDER_SITE_OTHER): Payer: Self-pay | Admitting: *Deleted

## 2015-10-26 ENCOUNTER — Encounter (INDEPENDENT_AMBULATORY_CARE_PROVIDER_SITE_OTHER): Payer: Self-pay | Admitting: *Deleted

## 2015-10-26 DIAGNOSIS — B182 Chronic viral hepatitis C: Secondary | ICD-10-CM

## 2015-10-26 NOTE — Telephone Encounter (Signed)
.  Per Lelon Perla the patient will need to have lab work 4 weeks. Note patient started his medication on 10/19/15, four weeks will be 11/09/2015.

## 2015-10-26 NOTE — Telephone Encounter (Signed)
appt sch'd 11/17/15, left detailed message for patient

## 2015-10-26 NOTE — Telephone Encounter (Signed)
Ann, needs OV in 4 weeks.  Tammy, CBC, Hepatic function, Hep C quaint in 4 weeks.

## 2015-10-26 NOTE — Telephone Encounter (Signed)
Labs are noted for 11/09/15. A letter has been sent to patient as a reminder.

## 2015-11-09 LAB — CBC WITH DIFFERENTIAL/PLATELET
Basophils Absolute: 0 10*3/uL (ref 0.0–0.1)
Basophils Relative: 0 % (ref 0–1)
EOS ABS: 0.1 10*3/uL (ref 0.0–0.7)
EOS PCT: 1 % (ref 0–5)
HCT: 39.3 % (ref 39.0–52.0)
Hemoglobin: 12.8 g/dL — ABNORMAL LOW (ref 13.0–17.0)
LYMPHS ABS: 2.9 10*3/uL (ref 0.7–4.0)
Lymphocytes Relative: 38 % (ref 12–46)
MCH: 30 pg (ref 26.0–34.0)
MCHC: 32.6 g/dL (ref 30.0–36.0)
MCV: 92.3 fL (ref 78.0–100.0)
MONOS PCT: 6 % (ref 3–12)
MPV: 9.9 fL (ref 8.6–12.4)
Monocytes Absolute: 0.5 10*3/uL (ref 0.1–1.0)
Neutro Abs: 4.2 10*3/uL (ref 1.7–7.7)
Neutrophils Relative %: 55 % (ref 43–77)
PLATELETS: 291 10*3/uL (ref 150–400)
RBC: 4.26 MIL/uL (ref 4.22–5.81)
RDW: 14.5 % (ref 11.5–15.5)
WBC: 7.6 10*3/uL (ref 4.0–10.5)

## 2015-11-10 ENCOUNTER — Encounter (INDEPENDENT_AMBULATORY_CARE_PROVIDER_SITE_OTHER): Payer: Self-pay | Admitting: *Deleted

## 2015-11-10 LAB — HEPATIC FUNCTION PANEL
ALBUMIN: 4.1 g/dL (ref 3.6–5.1)
ALK PHOS: 84 U/L (ref 40–115)
ALT: 15 U/L (ref 9–46)
AST: 15 U/L (ref 10–35)
BILIRUBIN DIRECT: 0.1 mg/dL (ref <=0.2)
BILIRUBIN TOTAL: 0.5 mg/dL (ref 0.2–1.2)
Indirect Bilirubin: 0.4 mg/dL (ref 0.2–1.2)
Total Protein: 7.5 g/dL (ref 6.1–8.1)

## 2015-11-11 LAB — HEPATITIS C RNA QUANTITATIVE
HCV QUANT LOG: 1.59 {Log} — AB (ref <1.18)
HCV QUANT: 39 [IU]/mL — AB (ref <15)

## 2015-11-17 ENCOUNTER — Ambulatory Visit (INDEPENDENT_AMBULATORY_CARE_PROVIDER_SITE_OTHER): Payer: Medicaid Other | Admitting: Internal Medicine

## 2015-11-17 ENCOUNTER — Encounter (INDEPENDENT_AMBULATORY_CARE_PROVIDER_SITE_OTHER): Payer: Self-pay | Admitting: Internal Medicine

## 2015-11-17 VITALS — BP 138/90 | HR 72 | Temp 98.2°F | Ht 72.0 in | Wt 228.8 lb

## 2015-11-17 DIAGNOSIS — B1920 Unspecified viral hepatitis C without hepatic coma: Secondary | ICD-10-CM

## 2015-11-17 NOTE — Progress Notes (Addendum)
Subjective:    Patient ID: Charles [name], male    DOB: 08-13-1956, 60 y.o.   MRN: [id number]  HPI Here today for f/u of his Hepatitis C. He is genotype 2. Started tx 10/19/2015. This is week 5 for him.  He is taking Solvaldi and Ribavirin 1200mg  x 12 weeks.  11/09/2015 HcV quaint 39, 1.59. He has not quite cleared the virus. He is doing good. His appetite has remained good. He has gained 14 pounds since September at his OV. No rashes or joint pain. He is not having any side effects from the medication.  BMs are normal. No melena or BRRB.   CBC    Component Value Date/Time   WBC 7.6 11/09/2015 0952   RBC 4.26 11/09/2015 0952   HGB 12.8* 11/09/2015 0952   HCT 39.3 11/09/2015 0952   PLT 291 11/09/2015 0952   MCV 92.3 11/09/2015 0952   MCH 30.0 11/09/2015 0952   MCHC 32.6 11/09/2015 0952   RDW 14.5 11/09/2015 0952   LYMPHSABS 2.9 11/09/2015 0952   MONOABS 0.5 11/09/2015 0952   EOSABS 0.1 11/09/2015 0952   BASOSABS 0.0 11/09/2015 0952   Hepatic Function Panel     Component Value Date/Time   PROT 7.5 11/09/2015 0954   ALBUMIN 4.1 11/09/2015 0954   AST 15 11/09/2015 0954   ALT 15 11/09/2015 0954   ALKPHOS 84 11/09/2015 0954   BILITOT 0.5 11/09/2015 0954   BILIDIR 0.1 11/09/2015 0954   IBILI 0.4 11/09/2015 0954        Review of Systems       Past Medical History  Diagnosis Date  . Fracture of right foot   . Hypertension   . Diabetes mellitus   . Mental disorder     schizophrenia  . Anemia   . Chronic back pain   . Anxiety   . DEMENTIA     Nursing facility feels he has Dementia    Past Surgical History  Procedure Laterality Date  . Right foot suger    . Left 1st toe    . Orif toe fracture  09/05/2011    Procedure: OPEN REDUCTION INTERNAL FIXATION (ORIF) METATARSAL (TOE) FRACTURE;  Surgeon: Colin Rhein;  Location: [place];  Service: Orthopedics;  Laterality: Left;  reconstruction left great toe FHB plantar plate    No Known  Allergies  Current Outpatient Prescriptions on File Prior to Visit  Medication Sig Dispense Refill  . aspirin EC 81 MG tablet Take 81 mg by mouth daily with breakfast.    . cyclobenzaprine (FLEXERIL) 10 MG tablet Take 10 mg by mouth daily.    . ferrous sulfate 325 (65 FE) MG tablet Take 325 mg by mouth daily with breakfast.    . furosemide (LASIX) 40 MG tablet Take 40 mg by mouth daily with breakfast.    . gabapentin (NEURONTIN) 600 MG tablet Take 600 mg by mouth 3 (three) times daily.     . insulin detemir (LEVEMIR) 100 UNIT/ML injection Inject 50 Units into the skin at bedtime.     . insulin lispro (HUMALOG) 100 UNIT/ML injection Inject 5 Units into the skin 3 (three) times daily before meals.     Marland Kitchen lisinopril (PRINIVIL,ZESTRIL) 10 MG tablet Take 10 mg by mouth daily.    . naproxen (NAPROSYN) 500 MG tablet Take 1 tablet (500 mg total) by mouth 2 (two) times daily. 30 tablet 0  . potassium chloride (K-DUR,KLOR-CON) 10 MEQ tablet Take 20 mEq by mouth  daily.    . pravastatin (PRAVACHOL) 40 MG tablet Take 40 mg by mouth daily. Reported on 11/17/2015    . sertraline (ZOLOFT) 100 MG tablet Take 100 mg by mouth daily.    . tamsulosin (FLOMAX) 0.4 MG CAPS capsule Take 0.4 mg by mouth.     No current facility-administered medications on file prior to visit.        Objective:   Physical Exam Blood pressure 138/90, pulse 72, temperature 98.2 F (36.8 C), height 6' (1.829 m), weight 228 lb 12.8 oz (103.783 kg). Alert and oriented. Skin warm and dry. Oral mucosa is moist.   . Sclera anicteric, conjunctivae is pink. Thyroid not enlarged. No cervical lymphadenopathy. Lungs clear. Heart regular rate and rhythm.  Abdomen is soft. Bowel sounds are positive. No hepatomegaly. No abdominal masses felt. No tenderness.  No edema to lower extremities.         Assessment & Plan:  Hepatitis C. He is doing well. He has not quite cleared the virus. Will get a hep C quaint today. He will have OV in 4 weeks.   Solvaldi and Ribavirin meds given to him today and I personally went over the way to take them.

## 2015-11-17 NOTE — Patient Instructions (Signed)
Hep C quaint today. OV in 4 weeks.

## 2015-11-19 LAB — HEPATITIS C RNA QUANTITATIVE
HCV Quantitative Log: 1.36 {Log} — ABNORMAL HIGH (ref <1.18)
HCV Quantitative: 23 IU/mL — ABNORMAL HIGH (ref <15)

## 2015-11-30 ENCOUNTER — Telehealth (INDEPENDENT_AMBULATORY_CARE_PROVIDER_SITE_OTHER): Payer: Self-pay | Admitting: *Deleted

## 2015-11-30 NOTE — Telephone Encounter (Signed)
[facility] [facility] has called. They would like for [name] to call them and verify that patient is Charles [name] completed treatment. They have documented that he has completed 8 weeks of treatment, however it shows that he has 4 more weeks of the Ribavirin to go. They need clarification on this.  Call back number is :[phone]

## 2015-12-01 NOTE — Telephone Encounter (Signed)
Charles [name] - please release to [facility] patient's 4 week Hepatitis C RNA Quant. This for continuation of his therapy. Their fax number is [phone].  I had sent Terri a message on this yesterday or about him having 4 weeks more of Ribavirin and that she need to address it. Not sure if she did yet or not.

## 2015-12-02 NOTE — Telephone Encounter (Signed)
Labs faxed to [facility]

## 2015-12-09 ENCOUNTER — Encounter (INDEPENDENT_AMBULATORY_CARE_PROVIDER_SITE_OTHER): Payer: Self-pay | Admitting: *Deleted

## 2015-12-15 ENCOUNTER — Encounter (INDEPENDENT_AMBULATORY_CARE_PROVIDER_SITE_OTHER): Payer: Self-pay | Admitting: Internal Medicine

## 2015-12-15 ENCOUNTER — Ambulatory Visit (INDEPENDENT_AMBULATORY_CARE_PROVIDER_SITE_OTHER): Payer: Medicaid Other | Admitting: Internal Medicine

## 2015-12-15 VITALS — BP 160/80 | HR 76 | Temp 98.2°F | Ht 72.0 in | Wt 230.6 lb

## 2015-12-15 DIAGNOSIS — B1710 Acute hepatitis C without hepatic coma: Secondary | ICD-10-CM | POA: Diagnosis not present

## 2015-12-15 LAB — CBC WITH DIFFERENTIAL/PLATELET
BASOS PCT: 0 % (ref 0–1)
Basophils Absolute: 0 10*3/uL (ref 0.0–0.1)
Eosinophils Absolute: 0.1 10*3/uL (ref 0.0–0.7)
Eosinophils Relative: 1 % (ref 0–5)
HCT: 37.9 % — ABNORMAL LOW (ref 39.0–52.0)
HEMOGLOBIN: 11.9 g/dL — AB (ref 13.0–17.0)
Lymphocytes Relative: 33 % (ref 12–46)
Lymphs Abs: 2 10*3/uL (ref 0.7–4.0)
MCH: 30.1 pg (ref 26.0–34.0)
MCHC: 31.4 g/dL (ref 30.0–36.0)
MCV: 95.7 fL (ref 78.0–100.0)
MPV: 9.4 fL (ref 8.6–12.4)
Monocytes Absolute: 0.4 10*3/uL (ref 0.1–1.0)
Monocytes Relative: 7 % (ref 3–12)
NEUTROS ABS: 3.6 10*3/uL (ref 1.7–7.7)
NEUTROS PCT: 59 % (ref 43–77)
Platelets: 276 10*3/uL (ref 150–400)
RBC: 3.96 MIL/uL — AB (ref 4.22–5.81)
RDW: 14 % (ref 11.5–15.5)
WBC: 6.1 10*3/uL (ref 4.0–10.5)

## 2015-12-15 LAB — HEPATIC FUNCTION PANEL
ALK PHOS: 90 U/L (ref 40–115)
ALT: 17 U/L (ref 9–46)
AST: 17 U/L (ref 10–35)
Albumin: 4 g/dL (ref 3.6–5.1)
BILIRUBIN DIRECT: 0.1 mg/dL (ref <=0.2)
BILIRUBIN INDIRECT: 0.4 mg/dL (ref 0.2–1.2)
BILIRUBIN TOTAL: 0.5 mg/dL (ref 0.2–1.2)
Total Protein: 7.1 g/dL (ref 6.1–8.1)

## 2015-12-15 NOTE — Progress Notes (Signed)
Subjective:    Patient ID: Charles [name], male    DOB: July 15, 1956, 60 y.o.   MRN: [id number]  HPI Here today for f/u of his Hepatitis C. He is Genotype 2. He started tx 10/19/2015.  This is week 8 for him. Treated with Solvaldi and Ribavirin 1200mg  x 12 weeks.  He tells me he is doing well.Marland Kitchen Appetite is good. No weight loss.  He is exercising by walking. He says he mostly rides his scooter. BMs are normal. No joint pian. No rashes.    11/17/2015 Hep C quaint 25.  Hepatic Function Panel     Component Value Date/Time   PROT 7.5 11/09/2015 0954   ALBUMIN 4.1 11/09/2015 0954   AST 15 11/09/2015 0954   ALT 15 11/09/2015 0954   ALKPHOS 84 11/09/2015 0954   BILITOT 0.5 11/09/2015 0954   BILIDIR 0.1 11/09/2015 0954   IBILI 0.4 11/09/2015 0954   CBC    Component Value Date/Time   WBC 7.6 11/09/2015 0952   RBC 4.26 11/09/2015 0952   HGB 12.8* 11/09/2015 0952   HCT 39.3 11/09/2015 0952   PLT 291 11/09/2015 0952   MCV 92.3 11/09/2015 0952   MCH 30.0 11/09/2015 0952   MCHC 32.6 11/09/2015 0952   RDW 14.5 11/09/2015 0952   LYMPHSABS 2.9 11/09/2015 0952   MONOABS 0.5 11/09/2015 0952   EOSABS 0.1 11/09/2015 0952   BASOSABS 0.0 11/09/2015 0952          Review of Systems Past Medical History  Diagnosis Date  . Fracture of right foot   . Hypertension   . Diabetes mellitus   . Mental disorder     schizophrenia  . Anemia   . Chronic back pain   . Anxiety   . DEMENTIA     Nursing facility feels he has Dementia    Past Surgical History  Procedure Laterality Date  . Right foot suger    . Left 1st toe    . Orif toe fracture  09/05/2011    Procedure: OPEN REDUCTION INTERNAL FIXATION (ORIF) METATARSAL (TOE) FRACTURE;  Surgeon: Colin Rhein;  Location: [place];  Service: Orthopedics;  Laterality: Left;  reconstruction left great toe FHB plantar plate    No Known Allergies  Current Outpatient Prescriptions on File Prior to Visit  Medication Sig  Dispense Refill  . aspirin EC 81 MG tablet Take 81 mg by mouth daily with breakfast.    . cyclobenzaprine (FLEXERIL) 10 MG tablet Take 10 mg by mouth daily.    . ferrous sulfate 325 (65 FE) MG tablet Take 325 mg by mouth daily with breakfast.    . furosemide (LASIX) 40 MG tablet Take 40 mg by mouth daily with breakfast.    . gabapentin (NEURONTIN) 600 MG tablet Take 600 mg by mouth 3 (three) times daily.     . insulin detemir (LEVEMIR) 100 UNIT/ML injection Inject 50 Units into the skin at bedtime.     . insulin lispro (HUMALOG) 100 UNIT/ML injection Inject 5 Units into the skin 3 (three) times daily before meals.     Marland Kitchen lisinopril (PRINIVIL,ZESTRIL) 10 MG tablet Take 10 mg by mouth daily.    . naproxen (NAPROSYN) 500 MG tablet Take 1 tablet (500 mg total) by mouth 2 (two) times daily. 30 tablet 0  . potassium chloride (K-DUR,KLOR-CON) 10 MEQ tablet Take 20 mEq by mouth daily.    . pravastatin (PRAVACHOL) 40 MG tablet Take 40 mg by mouth daily. Reported  on 11/17/2015    . ribavirin (REBETOL) 200 MG capsule Take 1,200 mg by mouth daily.    . sertraline (ZOLOFT) 100 MG tablet Take 100 mg by mouth daily.    . Sofosbuvir (SOVALDI) 400 MG TABS Take by mouth.    . tamsulosin (FLOMAX) 0.4 MG CAPS capsule Take 0.4 mg by mouth.     No current facility-administered medications on file prior to visit.        Objective:   Physical Exam Blood pressure 160/80, pulse 76, temperature 98.2 F (36.8 C), height 6' (1.829 m), weight 230 lb 9.6 oz (104.599 kg). Alert and oriented. Skin warm and dry. Oral mucosa is moist.   . Sclera anicteric, conjunctivae is pink. Thyroid not enlarged. No cervical lymphadenopathy. Lungs clear. Heart regular rate and rhythm.  Abdomen is soft. Bowel sounds are positive. No hepatomegaly. No abdominal masses felt. No tenderness.  No edema to lower extremities.         Assessment & Plan:  Hepatitis C. Will get a CBC, Hepatic function, and Hep C quaint today. OV in 5 weeks.

## 2015-12-15 NOTE — Patient Instructions (Signed)
OV in 4 weeks.  

## 2015-12-16 LAB — HEPATITIS C RNA QUANTITATIVE: HCV Quantitative: NOT DETECTED IU/mL (ref <15)

## 2016-01-13 ENCOUNTER — Ambulatory Visit (INDEPENDENT_AMBULATORY_CARE_PROVIDER_SITE_OTHER): Payer: Medicaid Other | Admitting: Internal Medicine

## 2016-01-13 ENCOUNTER — Encounter (INDEPENDENT_AMBULATORY_CARE_PROVIDER_SITE_OTHER): Payer: Self-pay | Admitting: Internal Medicine

## 2016-01-13 VITALS — BP 160/100 | HR 72 | Temp 98.2°F | Ht 72.0 in | Wt 232.0 lb

## 2016-01-13 DIAGNOSIS — B1710 Acute hepatitis C without hepatic coma: Secondary | ICD-10-CM | POA: Diagnosis not present

## 2016-01-13 LAB — CBC WITH DIFFERENTIAL/PLATELET
BASOS ABS: 0 10*3/uL (ref 0.0–0.1)
BASOS PCT: 0 % (ref 0–1)
Eosinophils Absolute: 0.2 10*3/uL (ref 0.0–0.7)
Eosinophils Relative: 2 % (ref 0–5)
HCT: 39.1 % (ref 39.0–52.0)
HEMOGLOBIN: 12.4 g/dL — AB (ref 13.0–17.0)
LYMPHS ABS: 2.6 10*3/uL (ref 0.7–4.0)
Lymphocytes Relative: 34 % (ref 12–46)
MCH: 29.8 pg (ref 26.0–34.0)
MCHC: 31.7 g/dL (ref 30.0–36.0)
MCV: 94 fL (ref 78.0–100.0)
MONOS PCT: 7 % (ref 3–12)
MPV: 9.7 fL (ref 8.6–12.4)
Monocytes Absolute: 0.5 10*3/uL (ref 0.1–1.0)
NEUTROS ABS: 4.3 10*3/uL (ref 1.7–7.7)
NEUTROS PCT: 57 % (ref 43–77)
PLATELETS: 274 10*3/uL (ref 150–400)
RBC: 4.16 MIL/uL — ABNORMAL LOW (ref 4.22–5.81)
RDW: 13 % (ref 11.5–15.5)
WBC: 7.5 10*3/uL (ref 4.0–10.5)

## 2016-01-13 NOTE — Patient Instructions (Signed)
CBC,. CMET, Hep C quaint. OV in 6 months.

## 2016-01-13 NOTE — Progress Notes (Signed)
 Subjective:    Patient ID: Charles [name], male    DOB: 07/22/1956, 59 y.o.   MRN: [id number]  HPI   HPI Here today for f/u of his Hepatitis C. He is Genotype 2. He started tx 10/19/2015.  Treated with Solvaldi and Ribavirin 1200mg x 12 weeks.  This is his last week of tx.  He has cleared the virus.  He tells me he is doing well.. Appetite is good. No weight loss. He is exercising by walking. He says he mostly rides his scooter. No rashes. BMs are normal. US elast F2-F3  12/15/2015 Hep C quaint undetected.  CBC    Component Value Date/Time   WBC 6.1 12/15/2015 0854   RBC 3.96* 12/15/2015 0854   HGB 11.9* 12/15/2015 0854   HCT 37.9* 12/15/2015 0854   PLT 276 12/15/2015 0854   MCV 95.7 12/15/2015 0854   MCH 30.1 12/15/2015 0854   MCHC 31.4 12/15/2015 0854   RDW 14.0 12/15/2015 0854   LYMPHSABS 2.0 12/15/2015 0854   MONOABS 0.4 12/15/2015 0854   EOSABS 0.1 12/15/2015 0854   BASOSABS 0.0 12/15/2015 0854   Hepatic Function Latest Ref Rng 12/15/2015 11/09/2015 07/08/2015  Total Protein 6.1 - 8.1 g/dL 7.1 7.5 7.7  Albumin 3.6 - 5.1 g/dL 4.0 4.1 4.1  AST 10 - 35 U/L 17 15 64(H)  ALT 9 - 46 U/L 17 15 93(H)  Alk Phosphatase 40 - 115 U/L 90 84 99  Total Bilirubin 0.2 - 1.2 mg/dL 0.5 0.5 0.5  Bilirubin, Direct <=0.2 mg/dL 0.1 0.1 0.1                 Review of Systems  Past Medical History  Diagnosis Date  . Fracture of right foot   . Hypertension   . Diabetes mellitus   . Mental disorder     schizophrenia  . Anemia   . Chronic back pain   . Anxiety   . DEMENTIA     Nursing facility feels he has Dementia    Past Surgical History  Procedure Laterality Date  . Right foot suger    . Left 1st toe    . Orif toe fracture  09/05/2011    Procedure: OPEN REDUCTION INTERNAL FIXATION (ORIF) METATARSAL (TOE) FRACTURE;  Surgeon: Paul A Bednarz;  Location: [facility];  Service: Orthopedics;  Laterality: Left;  reconstruction left great toe FHB plantar plate     No Known Allergies  Current Outpatient Prescriptions on File Prior to Visit  Medication Sig Dispense Refill  . aspirin EC 81 MG tablet Take 81 mg by mouth daily with breakfast.    . cyclobenzaprine (FLEXERIL) 10 MG tablet Take 10 mg by mouth daily.    . ferrous sulfate 325 (65 FE) MG tablet Take 325 mg by mouth daily with breakfast.    . furosemide (LASIX) 40 MG tablet Take 40 mg by mouth daily with breakfast.    . gabapentin (NEURONTIN) 600 MG tablet Take 600 mg by mouth 3 (three) times daily.     . insulin detemir (LEVEMIR) 100 UNIT/ML injection Inject 50 Units into the skin at bedtime.     . insulin lispro (HUMALOG) 100 UNIT/ML injection Inject 5 Units into the skin 3 (three) times daily before meals.     . lisinopril (PRINIVIL,ZESTRIL) 10 MG tablet Take 10 mg by mouth daily.    . naproxen (NAPROSYN) 500 MG tablet Take 1 tablet (500 mg total) by mouth 2 (two) times daily. 30 tablet   0  . potassium chloride (K-DUR,KLOR-CON) 10 MEQ tablet Take 20 mEq by mouth daily.    . pravastatin (PRAVACHOL) 40 MG tablet Take 40 mg by mouth daily. Reported on 11/17/2015    . ribavirin (REBETOL) 200 MG capsule Take 1,200 mg by mouth daily.    . sertraline (ZOLOFT) 100 MG tablet Take 100 mg by mouth daily.    . Sofosbuvir (SOVALDI) 400 MG TABS Take by mouth.    . tamsulosin (FLOMAX) 0.4 MG CAPS capsule Take 0.4 mg by mouth.     No current facility-administered medications on file prior to visit.        Objective:   Physical Exam Blood pressure 160/100, pulse 72, temperature 98.2 F (36.8 C), height 6' (1.829 m), weight 232 lb (105.235 kg). Alert and oriented. Skin warm and dry. Oral mucosa is moist.   . Sclera anicteric, conjunctivae is pink. Thyroid not enlarged. No cervical lymphadenopathy. Lungs clear. Heart regular rate and rhythm.  Abdomen is soft. Bowel sounds are positive. No hepatomegaly. No abdominal masses felt. No tenderness.  No edema to lower extremities.          Assessment &  Plan:  Hepatitis C. He has cleared the virus. Will get a CBC, CMET, and Hep C quaint  OV in 6 months.  

## 2016-01-14 LAB — HEPATITIS C RNA QUANTITATIVE: HCV QUANT: NOT DETECTED [IU]/mL (ref <15)

## 2016-01-14 LAB — COMPREHENSIVE METABOLIC PANEL
ALBUMIN: 4.1 g/dL (ref 3.6–5.1)
ALK PHOS: 87 U/L (ref 40–115)
ALT: 22 U/L (ref 9–46)
AST: 19 U/L (ref 10–35)
BILIRUBIN TOTAL: 0.7 mg/dL (ref 0.2–1.2)
BUN: 11 mg/dL (ref 7–25)
CHLORIDE: 105 mmol/L (ref 98–110)
CO2: 23 mmol/L (ref 20–31)
CREATININE: 0.96 mg/dL (ref 0.70–1.33)
Calcium: 9.3 mg/dL (ref 8.6–10.3)
Glucose, Bld: 85 mg/dL (ref 65–99)
Potassium: 4 mmol/L (ref 3.5–5.3)
SODIUM: 138 mmol/L (ref 135–146)
TOTAL PROTEIN: 7 g/dL (ref 6.1–8.1)
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
## 2016-07-19 ENCOUNTER — Encounter (INDEPENDENT_AMBULATORY_CARE_PROVIDER_SITE_OTHER): Payer: Self-pay | Admitting: Internal Medicine

## 2016-07-19 ENCOUNTER — Encounter (INDEPENDENT_AMBULATORY_CARE_PROVIDER_SITE_OTHER): Payer: Self-pay | Admitting: *Deleted

## 2016-07-19 ENCOUNTER — Ambulatory Visit (INDEPENDENT_AMBULATORY_CARE_PROVIDER_SITE_OTHER): Payer: Medicaid Other | Admitting: Internal Medicine

## 2016-07-19 VITALS — BP 148/78 | HR 72 | Temp 97.9°F | Ht 72.0 in | Wt 229.6 lb

## 2016-07-19 DIAGNOSIS — B1920 Unspecified viral hepatitis C without hepatic coma: Secondary | ICD-10-CM

## 2016-07-19 LAB — CBC WITH DIFFERENTIAL/PLATELET
BASOS ABS: 0 {cells}/uL (ref 0–200)
Basophils Relative: 0 %
EOS ABS: 0 {cells}/uL — AB (ref 15–500)
Eosinophils Relative: 0 %
HEMATOCRIT: 42.7 % (ref 38.5–50.0)
HEMOGLOBIN: 14.2 g/dL (ref 13.2–17.1)
LYMPHS ABS: 4096 {cells}/uL — AB (ref 850–3900)
Lymphocytes Relative: 64 %
MCH: 29.6 pg (ref 27.0–33.0)
MCHC: 33.3 g/dL (ref 32.0–36.0)
MCV: 89.1 fL (ref 80.0–100.0)
MONO ABS: 576 {cells}/uL (ref 200–950)
MPV: 9.7 fL (ref 7.5–12.5)
Monocytes Relative: 9 %
NEUTROS ABS: 1728 {cells}/uL (ref 1500–7800)
NEUTROS PCT: 27 %
Platelets: 268 10*3/uL (ref 140–400)
RBC: 4.79 MIL/uL (ref 4.20–5.80)
RDW: 13.3 % (ref 11.0–15.0)
WBC: 6.4 10*3/uL (ref 3.8–10.8)

## 2016-07-19 NOTE — Patient Instructions (Signed)
Labs today. [place] RUQ. OV in 1 year.

## 2016-07-19 NOTE — Progress Notes (Signed)
Subjective:    Patient ID: Charles [name], male    DOB: [birth year], 58 y.o.   MRN: [id number]  HPI Here today for f/u of his Hepatitis C. Genotype 2. Started tx 10/19/2015. Treated with Solvaldi and Ribavirin 1200mg  x 12 weeks. He has cleared the virus.  [place] Elast F2-F3.  01/13/2015 Hep C quaint undetected. 12/15/2015 Hep C quaint undetected.    [place] elast F2-F3 States he is doing good. Appetite is good. No weight. No abdominal pain. Usually has a BM daily.     CBC    Component Value Date/Time   WBC 7.5 01/13/2016 1553   RBC 4.16 (L) 01/13/2016 1553   HGB 12.4 (L) 01/13/2016 1553   HCT 39.1 01/13/2016 1553   PLT 274 01/13/2016 1553   MCV 94.0 01/13/2016 1553   MCH 29.8 01/13/2016 1553   MCHC 31.7 01/13/2016 1553   RDW 13.0 01/13/2016 1553   LYMPHSABS 2.6 01/13/2016 1553   MONOABS 0.5 01/13/2016 1553   EOSABS 0.2 01/13/2016 1553   BASOSABS 0.0 01/13/2016 1553   Hepatic Function Panel     Component Value Date/Time   PROT 7.0 01/13/2016 1553   ALBUMIN 4.1 01/13/2016 1553   AST 19 01/13/2016 1553   ALT 22 01/13/2016 1553   ALKPHOS 87 01/13/2016 1553   BILITOT 0.7 01/13/2016 1553   BILIDIR 0.1 12/15/2015 0854   IBILI 0.4 12/15/2015 0854      Review of Systems Past Medical History:  Diagnosis Date  . Anemia   . Anxiety   . Chronic back pain   . DEMENTIA    Nursing facility feels he has Dementia  . Diabetes mellitus   . Fracture of right foot   . Hypertension   . Mental disorder    schizophrenia    Past Surgical History:  Procedure Laterality Date  . left 1st toe    . ORIF TOE FRACTURE  09/05/2011   Procedure: OPEN REDUCTION INTERNAL FIXATION (ORIF) METATARSAL (TOE) FRACTURE;  Surgeon: Colin Rhein;  Location: [place];  Service: Orthopedics;  Laterality: Left;  reconstruction left great toe FHB plantar plate  . right foot suger      No Known Allergies  Current Outpatient Prescriptions on File Prior to Visit  Medication Sig Dispense  Refill  . aspirin EC 81 MG tablet Take 81 mg by mouth daily with breakfast.    . cyclobenzaprine (FLEXERIL) 10 MG tablet Take 10 mg by mouth daily.    . ferrous sulfate 325 (65 FE) MG tablet Take 325 mg by mouth daily with breakfast.    . furosemide (LASIX) 40 MG tablet Take 40 mg by mouth daily with breakfast.    . gabapentin (NEURONTIN) 600 MG tablet Take 600 mg by mouth 3 (three) times daily.     . insulin detemir (LEVEMIR) 100 UNIT/ML injection Inject 50 Units into the skin at bedtime.     . insulin lispro (HUMALOG) 100 UNIT/ML injection Inject 5 Units into the skin 3 (three) times daily before meals.     Marland Kitchen lisinopril (PRINIVIL,ZESTRIL) 10 MG tablet Take 10 mg by mouth daily.    . naproxen (NAPROSYN) 500 MG tablet Take 1 tablet (500 mg total) by mouth 2 (two) times daily. 30 tablet 0  . potassium chloride (K-DUR,KLOR-CON) 10 MEQ tablet Take 20 mEq by mouth daily.    . pravastatin (PRAVACHOL) 40 MG tablet Take 40 mg by mouth daily. Reported on 11/17/2015    . sertraline (ZOLOFT) 100 MG tablet  Take 100 mg by mouth daily.    . tamsulosin (FLOMAX) 0.4 MG CAPS capsule Take 0.4 mg by mouth.     No current facility-administered medications on file prior to visit.        Objective:   Physical Exam Blood pressure (!) 148/78, pulse 72, temperature 97.9 F (36.6 C), height 6' (1.829 m), weight 229 lb 9.6 oz (104.1 kg). Alert and oriented. Skin warm and dry. Oral mucosa is moist.   . Sclera anicteric, conjunctivae is pink. Thyroid not enlarged. No cervical lymphadenopathy. Lungs clear. Heart regular rate and rhythm.  Abdomen is soft. Bowel sounds are positive. No hepatomegaly. No abdominal masses felt. No tenderness.  No edema to lower extremities.           Assessment & Plan:  Hepatitis C. He has cleared the virus. Labs today. CBC, Hep C quaint, Cmet, [place] RUQ.  At patient's request, will order an HIV.  OV in 1 year.

## 2016-07-20 LAB — COMPREHENSIVE METABOLIC PANEL
ALBUMIN: 3.9 g/dL (ref 3.6–5.1)
ALT: 21 U/L (ref 9–46)
AST: 21 U/L (ref 10–35)
Alkaline Phosphatase: 80 U/L (ref 40–115)
BILIRUBIN TOTAL: 0.3 mg/dL (ref 0.2–1.2)
BUN: 18 mg/dL (ref 7–25)
CALCIUM: 9.3 mg/dL (ref 8.6–10.3)
CHLORIDE: 108 mmol/L (ref 98–110)
CO2: 21 mmol/L (ref 20–31)
CREATININE: 1.07 mg/dL (ref 0.70–1.33)
Glucose, Bld: 85 mg/dL (ref 65–99)
Potassium: 4.2 mmol/L (ref 3.5–5.3)
Sodium: 139 mmol/L (ref 135–146)
TOTAL PROTEIN: 7.6 g/dL (ref 6.1–8.1)

## 2016-07-20 LAB — HIV ANTIBODY (ROUTINE TESTING W REFLEX): HIV 1&2 Ab, 4th Generation: NONREACTIVE

## 2016-07-20 LAB — HEPATITIS C RNA QUANTITATIVE: HCV QUANT: NOT DETECTED [IU]/mL (ref <15)

## 2016-07-26 ENCOUNTER — Ambulatory Visit (HOSPITAL_COMMUNITY)
Admission: RE | Admit: 2016-07-26 | Discharge: 2016-07-26 | Disposition: A | Discharge status: Home / Self Care | Payer: Medicaid Other | Source: Ambulatory Visit | Attending: Internal Medicine | Admitting: Internal Medicine

## 2016-07-26 DIAGNOSIS — B1920 Unspecified viral hepatitis C without hepatic coma: Secondary | ICD-10-CM | POA: Insufficient documentation

## 2016-12-19 ENCOUNTER — Telehealth (INDEPENDENT_AMBULATORY_CARE_PROVIDER_SITE_OTHER): Payer: Self-pay | Admitting: Internal Medicine

## 2016-12-19 NOTE — Telephone Encounter (Signed)
Per by notes, OV in one year.   Charles [name], Recall for [place] RUQ in March of 2018

## 2016-12-19 NOTE — Telephone Encounter (Signed)
Noted in recall 

## 2016-12-19 NOTE — Telephone Encounter (Signed)
Patient presented to the office today, stated he had labs done a while back and wants to know when he is due to come back in.  [phone] OR [phone]

## 2016-12-20 ENCOUNTER — Encounter (INDEPENDENT_AMBULATORY_CARE_PROVIDER_SITE_OTHER): Payer: Self-pay | Admitting: Internal Medicine

## 2016-12-20 NOTE — Telephone Encounter (Signed)
Patient was given an appointment for 07/19/17 at 8:30am with [name], NP and a letter was mailed to the patient.

## 2017-01-05 ENCOUNTER — Encounter (INDEPENDENT_AMBULATORY_CARE_PROVIDER_SITE_OTHER): Payer: Self-pay | Admitting: *Deleted

## 2017-07-19 ENCOUNTER — Encounter (INDEPENDENT_AMBULATORY_CARE_PROVIDER_SITE_OTHER): Payer: Self-pay | Admitting: Internal Medicine

## 2017-07-19 ENCOUNTER — Ambulatory Visit (INDEPENDENT_AMBULATORY_CARE_PROVIDER_SITE_OTHER): Payer: Medicaid Other | Admitting: Internal Medicine

## 2017-08-08 ENCOUNTER — Encounter (INDEPENDENT_AMBULATORY_CARE_PROVIDER_SITE_OTHER): Payer: Self-pay | Admitting: *Deleted

## 2017-08-15 ENCOUNTER — Telehealth (INDEPENDENT_AMBULATORY_CARE_PROVIDER_SITE_OTHER): Payer: Self-pay | Admitting: Internal Medicine

## 2017-08-15 NOTE — Telephone Encounter (Signed)
We received return mail for this patient.  I called the patient, lmoam that we needed to update his address.

## 2017-11-03 ENCOUNTER — Other Ambulatory Visit: Payer: Self-pay

## 2017-11-03 ENCOUNTER — Encounter (HOSPITAL_COMMUNITY): Payer: Self-pay | Admitting: *Deleted

## 2017-11-03 DIAGNOSIS — Y999 Unspecified external cause status: Secondary | ICD-10-CM | POA: Diagnosis not present

## 2017-11-03 DIAGNOSIS — Z794 Long term (current) use of insulin: Secondary | ICD-10-CM | POA: Insufficient documentation

## 2017-11-03 DIAGNOSIS — S0081XA Abrasion of other part of head, initial encounter: Secondary | ICD-10-CM | POA: Insufficient documentation

## 2017-11-03 DIAGNOSIS — Y939 Activity, unspecified: Secondary | ICD-10-CM | POA: Diagnosis not present

## 2017-11-03 DIAGNOSIS — F17210 Nicotine dependence, cigarettes, uncomplicated: Secondary | ICD-10-CM | POA: Insufficient documentation

## 2017-11-03 DIAGNOSIS — I10 Essential (primary) hypertension: Secondary | ICD-10-CM | POA: Insufficient documentation

## 2017-11-03 DIAGNOSIS — Z79899 Other long term (current) drug therapy: Secondary | ICD-10-CM | POA: Insufficient documentation

## 2017-11-03 DIAGNOSIS — S0083XA Contusion of other part of head, initial encounter: Secondary | ICD-10-CM | POA: Diagnosis not present

## 2017-11-03 DIAGNOSIS — M25511 Pain in right shoulder: Secondary | ICD-10-CM | POA: Diagnosis not present

## 2017-11-03 DIAGNOSIS — Z7982 Long term (current) use of aspirin: Secondary | ICD-10-CM | POA: Diagnosis not present

## 2017-11-03 DIAGNOSIS — S0990XA Unspecified injury of head, initial encounter: Secondary | ICD-10-CM | POA: Diagnosis present

## 2017-11-03 DIAGNOSIS — E119 Type 2 diabetes mellitus without complications: Secondary | ICD-10-CM | POA: Insufficient documentation

## 2017-11-03 DIAGNOSIS — M25512 Pain in left shoulder: Secondary | ICD-10-CM | POA: Insufficient documentation

## 2017-11-03 DIAGNOSIS — Y929 Unspecified place or not applicable: Secondary | ICD-10-CM | POA: Insufficient documentation
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
## 2017-11-04 ENCOUNTER — Emergency Department (HOSPITAL_COMMUNITY): Payer: Medicaid Other

## 2017-11-04 ENCOUNTER — Emergency Department (HOSPITAL_COMMUNITY)
Admission: EM | Admit: 2017-11-04 | Discharge: 2017-11-04 | Disposition: A | Discharge status: Home / Self Care | Payer: Medicaid Other | Attending: Emergency Medicine | Admitting: Emergency Medicine

## 2017-11-04 DIAGNOSIS — M25512 Pain in left shoulder: Secondary | ICD-10-CM

## 2017-11-04 DIAGNOSIS — M25511 Pain in right shoulder: Secondary | ICD-10-CM

## 2017-11-04 DIAGNOSIS — S0093XA Contusion of unspecified part of head, initial encounter: Secondary | ICD-10-CM

## 2017-11-04 DIAGNOSIS — S0081XA Abrasion of other part of head, initial encounter: Secondary | ICD-10-CM

## 2017-11-04 MED ORDER — BACITRACIN-NEOMYCIN-POLYMYXIN 400-5-5000 EX OINT
TOPICAL_OINTMENT | CUTANEOUS | Status: AC
  Filled 2017-11-04: qty 1

## 2017-11-04 MED ORDER — BACITRACIN-NEOMYCIN-POLYMYXIN 400-5-5000 EX OINT
TOPICAL_OINTMENT | Freq: Once | CUTANEOUS | Status: AC
  Administered 2017-11-04: 08:00:00 via TOPICAL

## 2017-11-04 MED ORDER — ACETAMINOPHEN 325 MG PO TABS
650.0000 mg | ORAL_TABLET | Freq: Once | ORAL | Status: AC
  Administered 2017-11-04: 650 mg via ORAL
  Filled 2017-11-04: qty 2

## 2017-11-04 MED ORDER — IBUPROFEN 400 MG PO TABS
600.0000 mg | ORAL_TABLET | Freq: Once | ORAL | Status: AC
  Administered 2017-11-04: 600 mg via ORAL
  Filled 2017-11-04: qty 2

## 2017-11-04 NOTE — Discharge Instructions (Addendum)
Use ice on the injured areas. Use antibiotic ointment on the abrasion above your right upper lip. You can take ibuprofen 600 mg + acetaminophen 650 mg for pain as needed. You will be stiff and sore for a few days. Return to the ED for any problems listed on the head injury sheet.
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
## 2017-11-08 ENCOUNTER — Encounter (INDEPENDENT_AMBULATORY_CARE_PROVIDER_SITE_OTHER): Payer: Self-pay | Admitting: *Deleted

## 2018-02-07 ENCOUNTER — Encounter (INDEPENDENT_AMBULATORY_CARE_PROVIDER_SITE_OTHER): Payer: Self-pay | Admitting: Internal Medicine

## 2018-02-07 ENCOUNTER — Telehealth (INDEPENDENT_AMBULATORY_CARE_PROVIDER_SITE_OTHER): Payer: Self-pay | Admitting: *Deleted

## 2018-02-07 ENCOUNTER — Encounter (INDEPENDENT_AMBULATORY_CARE_PROVIDER_SITE_OTHER): Payer: Self-pay | Admitting: *Deleted

## 2018-02-07 ENCOUNTER — Ambulatory Visit (INDEPENDENT_AMBULATORY_CARE_PROVIDER_SITE_OTHER): Payer: Medicaid Other | Admitting: Internal Medicine

## 2018-02-07 VITALS — BP 170/90 | HR 84 | Temp 98.0°F | Ht 72.0 in | Wt 229.9 lb

## 2018-02-07 DIAGNOSIS — B182 Chronic viral hepatitis C: Secondary | ICD-10-CM | POA: Diagnosis not present

## 2018-02-07 DIAGNOSIS — Z1211 Encounter for screening for malignant neoplasm of colon: Secondary | ICD-10-CM

## 2018-02-07 MED ORDER — PEG 3350-KCL-NA BICARB-NACL 420 G PO SOLR: 4000.0000 mL | Freq: Once | ORAL | 0 refills | Status: AC
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
## 2018-02-07 NOTE — Patient Instructions (Addendum)
Labs and [place].  OV in 1 year.  The risks of bleeding, perforation and infection were reviewed with patient.

## 2018-02-07 NOTE — Telephone Encounter (Signed)
Patient needs trilyte 

## 2018-02-08 DIAGNOSIS — Z1211 Encounter for screening for malignant neoplasm of colon: Secondary | ICD-10-CM | POA: Insufficient documentation

## 2018-02-11 LAB — HEPATITIS C RNA QUANTITATIVE
HCV QUANT LOG: NOT DETECTED {Log_IU}/mL
HCV RNA, PCR, QN: NOT DETECTED [IU]/mL

## 2018-02-11 LAB — AFP TUMOR MARKER: AFP-Tumor Marker: 4.5 ng/mL (ref <6.1)

## 2018-02-13 ENCOUNTER — Ambulatory Visit (HOSPITAL_COMMUNITY): Payer: Medicaid Other

## 2018-02-14 ENCOUNTER — Ambulatory Visit (HOSPITAL_COMMUNITY)
Admission: RE | Admit: 2018-02-14 | Discharge: 2018-02-14 | Disposition: A | Discharge status: Home / Self Care | Payer: Medicaid Other | Source: Ambulatory Visit | Attending: Internal Medicine | Admitting: Internal Medicine

## 2018-02-14 DIAGNOSIS — B182 Chronic viral hepatitis C: Secondary | ICD-10-CM | POA: Diagnosis not present

## 2018-03-20 ENCOUNTER — Encounter (INDEPENDENT_AMBULATORY_CARE_PROVIDER_SITE_OTHER): Payer: Self-pay | Admitting: *Deleted

## 2018-04-11 ENCOUNTER — Encounter (HOSPITAL_COMMUNITY)
Admission: RE | Disposition: A | Discharge status: Home Health | Payer: Self-pay | Source: Ambulatory Visit | Attending: Internal Medicine

## 2018-04-11 ENCOUNTER — Encounter (HOSPITAL_COMMUNITY): Payer: Self-pay | Admitting: *Deleted

## 2018-04-11 ENCOUNTER — Other Ambulatory Visit: Payer: Self-pay

## 2018-04-11 ENCOUNTER — Ambulatory Visit (HOSPITAL_COMMUNITY)
Admission: RE | Admit: 2018-04-11 | Discharge: 2018-04-11 | Disposition: A | Discharge status: Home Health | Payer: Medicaid Other | Source: Ambulatory Visit | Attending: Internal Medicine | Admitting: Internal Medicine

## 2018-04-11 DIAGNOSIS — D124 Benign neoplasm of descending colon: Secondary | ICD-10-CM

## 2018-04-11 DIAGNOSIS — F419 Anxiety disorder, unspecified: Secondary | ICD-10-CM | POA: Insufficient documentation

## 2018-04-11 DIAGNOSIS — E119 Type 2 diabetes mellitus without complications: Secondary | ICD-10-CM | POA: Diagnosis not present

## 2018-04-11 DIAGNOSIS — D649 Anemia, unspecified: Secondary | ICD-10-CM | POA: Insufficient documentation

## 2018-04-11 DIAGNOSIS — Z1211 Encounter for screening for malignant neoplasm of colon: Secondary | ICD-10-CM | POA: Diagnosis not present

## 2018-04-11 DIAGNOSIS — D120 Benign neoplasm of cecum: Secondary | ICD-10-CM | POA: Insufficient documentation

## 2018-04-11 DIAGNOSIS — Z7982 Long term (current) use of aspirin: Secondary | ICD-10-CM | POA: Diagnosis not present

## 2018-04-11 DIAGNOSIS — G8929 Other chronic pain: Secondary | ICD-10-CM | POA: Diagnosis not present

## 2018-04-11 DIAGNOSIS — D125 Benign neoplasm of sigmoid colon: Secondary | ICD-10-CM | POA: Insufficient documentation

## 2018-04-11 DIAGNOSIS — M549 Dorsalgia, unspecified: Secondary | ICD-10-CM | POA: Insufficient documentation

## 2018-04-11 DIAGNOSIS — F209 Schizophrenia, unspecified: Secondary | ICD-10-CM | POA: Insufficient documentation

## 2018-04-11 DIAGNOSIS — F17210 Nicotine dependence, cigarettes, uncomplicated: Secondary | ICD-10-CM | POA: Diagnosis not present

## 2018-04-11 DIAGNOSIS — Z79899 Other long term (current) drug therapy: Secondary | ICD-10-CM | POA: Diagnosis not present

## 2018-04-11 DIAGNOSIS — Z9119 Patient's noncompliance with other medical treatment and regimen: Secondary | ICD-10-CM | POA: Diagnosis not present

## 2018-04-11 DIAGNOSIS — I10 Essential (primary) hypertension: Secondary | ICD-10-CM | POA: Insufficient documentation

## 2018-04-11 DIAGNOSIS — D122 Benign neoplasm of ascending colon: Secondary | ICD-10-CM | POA: Insufficient documentation

## 2018-04-11 HISTORY — PX: POLYPECTOMY: SHX5525

## 2018-04-11 HISTORY — PX: COLONOSCOPY: SHX5424

## 2018-04-11 LAB — GLUCOSE, CAPILLARY: GLUCOSE-CAPILLARY: 103 mg/dL — AB (ref 65–99)

## 2018-04-11 SURGERY — COLONOSCOPY
Anesthesia: Moderate Sedation

## 2018-04-11 MED ORDER — SODIUM CHLORIDE 0.9 % IV SOLN
INTRAVENOUS | Status: DC
  Administered 2018-04-11: 12:00:00 via INTRAVENOUS

## 2018-04-11 MED ORDER — MEPERIDINE HCL 50 MG/ML IJ SOLN
INTRAMUSCULAR | Status: AC
  Filled 2018-04-11: qty 1

## 2018-04-11 MED ORDER — MEPERIDINE HCL 50 MG/ML IJ SOLN
INTRAMUSCULAR | Status: DC | PRN
  Administered 2018-04-11 (×2): 25 mg via INTRAVENOUS

## 2018-04-11 MED ORDER — STERILE WATER FOR IRRIGATION IR SOLN
Status: DC | PRN
  Administered 2018-04-11: 12:00:00

## 2018-04-11 MED ORDER — MIDAZOLAM HCL 5 MG/5ML IJ SOLN
INTRAMUSCULAR | Status: DC | PRN
  Administered 2018-04-11: 3 mg via INTRAVENOUS
  Administered 2018-04-11: 2 mg via INTRAVENOUS
  Administered 2018-04-11 (×2): 1 mg via INTRAVENOUS
  Administered 2018-04-11: 2 mg via INTRAVENOUS
  Administered 2018-04-11: 1 mg via INTRAVENOUS

## 2018-04-11 MED ORDER — MIDAZOLAM HCL 5 MG/5ML IJ SOLN
INTRAMUSCULAR | Status: AC
  Filled 2018-04-11: qty 10

## 2018-04-11 NOTE — Discharge Instructions (Signed)
No aspirin or NSAIDs for 1 week. Resume other medications as before. Resume usual diet. No driving for 24 hours. Patient will call with biopsy results. Repeat colonoscopy under monitored anesthesia care and 3 months.   Colonoscopy, Adult, Care After This sheet gives you information about how to care for yourself after your procedure. Your health care provider may also give you more specific instructions. If you have problems or questions, contact your health care provider. What can I expect after the procedure? After the procedure, it is common to have:  A small amount of blood in your stool for 24 hours after the procedure.  Some gas.  Mild abdominal cramping or bloating.  Follow these instructions at home: General instructions   For the first 24 hours after the procedure: ? Do not drive or use machinery. ? Do not sign important documents. ? Do not drink alcohol. ? Do your regular daily activities at a slower pace than normal. ? Eat soft, easy-to-digest foods. ? Rest often.  Take over-the-counter or prescription medicines only as told by your health care provider.  It is up to you to get the results of your procedure. Ask your health care provider, or the department performing the procedure, when your results will be ready. Relieving cramping and bloating  Try walking around when you have cramps or feel bloated.  Apply heat to your abdomen as told by your health care provider. Use a heat source that your health care provider recommends, such as a moist heat pack or a heating pad. ? Place a towel between your skin and the heat source. ? Leave the heat on for 20-30 minutes. ? Remove the heat if your skin turns bright red. This is especially important if you are unable to feel pain, heat, or cold. You may have a greater risk of getting burned. Eating and drinking  Drink enough fluid to keep your urine clear or pale yellow.  Resume your normal diet as instructed by your  health care provider. Avoid heavy or fried foods that are hard to digest.  Avoid drinking alcohol for as long as instructed by your health care provider. Contact a health care provider if:  You have blood in your stool 2-3 days after the procedure. Get help right away if:  You have more than a small spotting of blood in your stool.  You pass large blood clots in your stool.  Your abdomen is swollen.  You have nausea or vomiting.  You have a fever.  You have increasing abdominal pain that is not relieved with medicine. This information is not intended to replace advice given to you by your health care provider. Make sure you discuss any questions you have with your health care provider.   Colon Polyps Polyps are tissue growths inside the body. Polyps can grow in many places, including the large intestine (colon). A polyp may be a round bump or a mushroom-shaped growth. You could have one polyp or several. Most colon polyps are noncancerous (benign). However, some colon polyps can become cancerous over time. What are the causes? The exact cause of colon polyps is not known. What increases the risk? This condition is more likely to develop in people who:  Have a family history of colon cancer or colon polyps.  Are older than 12 or older than 45 if they are African American.  Have inflammatory bowel disease, such as ulcerative colitis or Crohn disease.  Are overweight.  Smoke cigarettes.  Do not get enough  exercise.  Drink too much alcohol.  Eat a diet that is: ? High in fat and red meat. ? Low in fiber.  Had childhood cancer that was treated with abdominal radiation.  What are the signs or symptoms? Most polyps do not cause symptoms. If you have symptoms, they may include:  Blood coming from your rectum when having a bowel movement.  Blood in your stool.The stool may look dark red or black.  A change in bowel habits, such as constipation or diarrhea.  How is  this diagnosed? This condition is diagnosed with a colonoscopy. This is a procedure that uses a lighted, flexible scope to look at the inside of your colon. How is this treated? Treatment for this condition involves removing any polyps that are found. Those polyps will then be tested for cancer. If cancer is found, your health care provider will talk to you about options for colon cancer treatment. Follow these instructions at home: Diet  Eat plenty of fiber, such as fruits, vegetables, and whole grains.  Eat foods that are high in calcium and vitamin D, such as milk, cheese, yogurt, eggs, liver, fish, and broccoli.  Limit foods high in fat, red meats, and processed meats, such as hot dogs, sausage, bacon, and lunch meats.  Maintain a healthy weight, or lose weight if recommended by your health care provider. General instructions  Do not smoke cigarettes.  Do not drink alcohol excessively.  Keep all follow-up visits as told by your health care provider. This is important. This includes keeping regularly scheduled colonoscopies. Talk to your health care provider about when you need a colonoscopy.  Exercise every day or as told by your health care provider. Contact a health care provider if:  You have new or worsening bleeding during a bowel movement.  You have new or increased blood in your stool.  You have a change in bowel habits.  You unexpectedly lose weight. This information is not intended to replace advice given to you by your health care provider. Make sure you discuss any questions you have with your health care provider.

## 2018-04-11 NOTE — Op Note (Signed)
[facility] Patient Name: Charles [name] Procedure Date: 04/11/2018 11:00 AM MRN: [id number] Date of Birth: [birth year] Attending MD: Hildred Laser , MD CSN: [id number] Age: 62 Admit Type: Outpatient Procedure:                Colonoscopy Indications:              Screening for colorectal malignant neoplasm Providers:                Hildred Laser, MD, Otis Peak B. Sharon Seller, RN, Aram Candela Referring MD:             Rosita Fire MD, MD Medicines:                Meperidine 50 mg IV, Midazolam 10 mg IV Complications:            No immediate complications. Estimated Blood Loss:     Estimated blood loss: none. Procedure:                Pre-Anesthesia Assessment:                           - Prior to the procedure, a History and Physical                            was performed, and patient medications and                            allergies were reviewed. The patient's tolerance of                            previous anesthesia was also reviewed. The risks                            and benefits of the procedure and the sedation                            options and risks were discussed with the patient.                            All questions were answered, and informed consent                            was obtained. Prior Anticoagulants: The patient has                            taken no previous anticoagulant or antiplatelet                            agents. ASA Grade Assessment: II - A patient with                            mild systemic disease. After reviewing the risks  and benefits, the patient was deemed in                            satisfactory condition to undergo the procedure.                           After obtaining informed consent, the colonoscope                            was passed under direct vision. Throughout the                            procedure, the patient's blood pressure, pulse, and              oxygen saturations were monitored continuously. The                            [id number] ([id number]) scope was introduced through                            the anus and advanced to the the cecum, identified                            by appendiceal orifice and ileocecal valve. The                            colonoscopy was technically difficult and complex                            due to inadequate bowel prep and a redundant colon.                            The patient tolerated the procedure well. The                            quality of the bowel preparation was fair. The                            ileocecal valve, appendiceal orifice, and rectum                            were photographed. Scope In: 12:24:52 PM Scope Out: 1:16:17 PM Scope Withdrawal Time: 0 hours 31 minutes 32 seconds  Total Procedure Duration: 0 hours 51 minutes 25 seconds  Findings:      The perianal and digital rectal examinations were normal.      Three semi-pedunculated polyps were found in the cecum. The polyps were       6 to 10 mm in size. These polyps were removed with a hot snare.       Resection and retrieval were complete. The pathology specimen was placed       into Bottle Number 4.      Two semi-pedunculated polyps were found in the ascending colon. The       polyps were 8 to 12 mm in size. These polyps were removed with  a hot       snare. Resection and retrieval were complete. The pathology specimen was       placed into Bottle Number 2.      A 15 mm polyp was found in the proximal sigmoid colon. The polyp was       pedunculated. The polyp was removed with a hot snare. Resection and       retrieval were complete. The pathology specimen was placed into Bottle       Number 3.      A 15 mm polyp was found in the distal descending colon. The polyp was       pedunculated. The polyp was removed with a hot snare. Resection and       retrieval were complete. The pathology specimen was placed  into Bottle       Number 3.      Two semi-sessile, non-bleeding polyps were found in the cecum. The       polyps were 8 to 20 mm in size. Polypectomy was not attempted due to       poor endoscopic visualization. Impression:               - Preparation of the colon was fair.                           - Three 6 to 10 mm polyps in the cecum, removed                            with a hot snare. Resected and retrieved.                           - Two 8 to 12 mm polyps in the ascending colon,                            removed with a hot snare. Resected and retrieved.                           - One 15 mm polyp in the proximal sigmoid colon,                            removed with a hot snare. Resected and retrieved.                           - One 15 mm polyp in the distal descending colon,                            removed with a hot snare. Resected and retrieved.                           - Two 8 to 20 mm, non-bleeding polyps in the cecum.                            Resection not attempted because of poor approach. Moderate Sedation:      Moderate (conscious) sedation was administered by the endoscopy nurse       and supervised by the endoscopist. The  following parameters were       monitored: oxygen saturation, heart rate, blood pressure, CO2       capnography and response to care. Total physician intraservice time was       57 minutes. Recommendation:           - Patient has a contact number available for                            emergencies. The signs and symptoms of potential                            delayed complications were discussed with the                            patient. Return to normal activities tomorrow.                            Written discharge instructions were provided to the                            patient.                           - Resume previous diet today.                           - Continue present medications.                           - No  aspirin, ibuprofen, naproxen, or other                            non-steroidal anti-inflammatory drugs for 7 days.                           - Await pathology results.                           - Repeat colonoscopy in 3 months to remove cecal                            polyps. Procedure Code(s):        --- Professional ---                           [phone], Colonoscopy, flexible; with removal of                            tumor(s), polyp(s), or other lesion(s) by snare                            technique                           G0500, Moderate sedation services provided by the  same physician or other qualified health care                            professional performing a gastrointestinal                            endoscopic service that sedation supports,                            requiring the presence of an independent trained                            observer to assist in the monitoring of the                            patient's level of consciousness and physiological                            status; initial 15 minutes of intra-service time;                            patient age 65 years or older (additional time may                            be reported with [phone], as appropriate)                           [phone], Moderate sedation services provided by the                            same physician or other qualified health care                            professional performing the diagnostic or                            therapeutic service that the sedation supports,                            requiring the presence of an independent trained                            observer to assist in the monitoring of the                            patient's level of consciousness and physiological                            status; each additional 15 minutes intraservice                            time (List separately in addition to code for  primary service)                           [phone], Moderate sedation services provided by the                            same physician or other qualified health care                            professional performing the diagnostic or                            therapeutic service that the sedation supports,                            requiring the presence of an independent trained                            observer to assist in the monitoring of the                            patient's level of consciousness and physiological                            status; each additional 15 minutes intraservice                            time (List separately in addition to code for                            primary service)                           [phone], Moderate sedation services provided by the                            same physician or other qualified health care                            professional performing the diagnostic or                            therapeutic service that the sedation supports,                            requiring the presence of an independent trained                            observer to assist in the monitoring of the                            patient's level of consciousness and physiological                            status; each additional 15 minutes intraservice  time (List separately in addition to code for                            primary service) Diagnosis Code(s):        --- Professional ---                           Z12.11, Encounter for screening for malignant                            neoplasm of colon                           D12.2, Benign neoplasm of ascending colon                           D12.0, Benign neoplasm of cecum                           D12.5, Benign neoplasm of sigmoid colon                           D12.4, Benign neoplasm of descending colon CPT copyright 2017 [employer] Medical  Association. All rights reserved. The codes documented in this report are preliminary and upon coder review may  be revised to meet current compliance requirements. Hildred Laser, MD Hildred Laser, MD 04/11/2018 1:29:26 PM This report has been signed electronically. Number of Addenda: 0

## 2018-04-11 NOTE — H&P (Signed)
Charles [name] is an 62 y.o. male.   Chief Complaint: Patient is here for colonoscopy. HPI: Patient is a 62 year-old Afro-American male who is here for screening colonoscopy.  He denies abdominal pain change in bowel habits or rectal bleeding. Family history is negative for CRC.  Past Medical History:  Diagnosis Date  . Anemia   . Anxiety   . Chronic back pain   .       Marland Kitchen Diabetes mellitus   . Fracture of right foot   . Hypertension   . Mental disorder    schizophrenia    Past Surgical History:  Procedure Laterality Date  . left 1st toe    . ORIF TOE FRACTURE  09/05/2011   Procedure: OPEN REDUCTION INTERNAL FIXATION (ORIF) METATARSAL (TOE) FRACTURE;  Surgeon: Colin Rhein;  Location: [facility];  Service: Orthopedics;  Laterality: Left;  reconstruction left great toe FHB plantar plate  . right foot suger      Family History  Problem Relation Age of Onset  . Colon cancer Neg Hx   . Liver disease Neg Hx        unknown for sure, mom died at age 12   Social History:  reports that he has been smoking cigarettes.  He has a 20.00 pack-year smoking history. He has never used smokeless tobacco. He reports that he drinks alcohol. He reports that he does not use drugs.  Allergies: No Known Allergies  Medications Prior to Admission  Medication Sig Dispense Refill  . aspirin EC 81 MG tablet Take 81 mg by mouth daily with breakfast.    . cyclobenzaprine (FLEXERIL) 10 MG tablet Take 10 mg by mouth daily.    . ferrous sulfate 325 (65 FE) MG tablet Take 325 mg by mouth daily with breakfast.    . furosemide (LASIX) 40 MG tablet Take 40 mg by mouth daily with breakfast.    . gabapentin (NEURONTIN) 600 MG tablet Take 600 mg by mouth 3 (three) times daily.     Marland Kitchen lisinopril (PRINIVIL,ZESTRIL) 10 MG tablet Take 10 mg by mouth daily.    . potassium chloride (K-DUR,KLOR-CON) 10 MEQ tablet Take 20 mEq by mouth daily.    . pravastatin (PRAVACHOL) 40 MG tablet Take 40 mg by mouth  daily. Reported on 11/17/2015    . tamsulosin (FLOMAX) 0.4 MG CAPS capsule Take 0.4 mg by mouth.      Results for orders placed or performed during the hospital encounter of 04/11/18 (from the past 48 hour(s))  Glucose, capillary     Status: Abnormal   Collection Time: 04/11/18 11:42 AM  Result Value Ref Range   Glucose-Capillary 103 (H) 65 - 99 mg/dL   No results found.  ROS  Blood pressure (!) 145/105, pulse 68, temperature 97.6 F (36.4 C), temperature source Oral, resp. rate 16, height 6' (1.829 m), weight 229 lb (103.9 kg), SpO2 99 %. Physical Exam  Constitutional: He appears well-developed and well-nourished.  HENT:  Mouth/Throat: Oropharynx is clear and moist.  Eyes: Conjunctivae are normal. No scleral icterus.  Neck: No thyromegaly present.  Cardiovascular: Normal rate, regular rhythm and normal heart sounds.  No murmur heard. Respiratory: Effort normal and breath sounds normal.  GI: Soft. He exhibits no distension and no mass. There is no tenderness.  Musculoskeletal: He exhibits no edema.  Lymphadenopathy:    He has no cervical adenopathy.  Neurological: He is alert.  Skin: Skin is warm and dry.     Assessment/Plan  Average risk screening colonoscopy.  Hildred Laser, MD 04/11/2018, 12:15 PM

## 2018-04-18 ENCOUNTER — Encounter (HOSPITAL_COMMUNITY): Payer: Self-pay | Admitting: Internal Medicine

## 2018-05-30 ENCOUNTER — Encounter (INDEPENDENT_AMBULATORY_CARE_PROVIDER_SITE_OTHER): Payer: Self-pay | Admitting: *Deleted

## 2018-06-13 ENCOUNTER — Other Ambulatory Visit (INDEPENDENT_AMBULATORY_CARE_PROVIDER_SITE_OTHER): Payer: Self-pay | Admitting: *Deleted

## 2018-06-13 DIAGNOSIS — Z86010 Personal history of colonic polyps: Secondary | ICD-10-CM

## 2018-06-14 ENCOUNTER — Encounter (INDEPENDENT_AMBULATORY_CARE_PROVIDER_SITE_OTHER): Payer: Self-pay | Admitting: *Deleted

## 2018-06-14 DIAGNOSIS — Z86010 Personal history of colonic polyps: Secondary | ICD-10-CM | POA: Insufficient documentation

## 2018-06-25 NOTE — Patient Instructions (Signed)
Charles [name]  06/25/2018     @PREFPERIOPPHARMACY @   Your procedure is scheduled on  07/05/2018 .  Report to [facility] at  900   A.M.  Call this number if you have problems the morning of surgery:  [phone]   Remember:  Do not eat or drink after midnight.  You may drink clear liquids until ( follow the instructions given to you) .  Clear liquids allowed are:                    Water, Juice (non-citric and without pulp), Carbonated beverages, Clear Tea, Black Coffee only, Plain Jell-O only, Gatorade and Plain Popsicles only    Take these medicines the morning of surgery with A SIP OF WATER flexaril, gabapentin, lisinopril, flomax.    Do not wear jewelry, make-up or nail polish.  Do not wear lotions, powders, or perfumes, or deodorant.  Do not shave 48 hours prior to surgery.  Men may shave face and neck.  Do not bring valuables to the hospital.  [facility] is not responsible for any belongings or valuables.  Contacts, dentures or bridgework may not be worn into surgery.  Leave your suitcase in the car.  After surgery it may be brought to your room.  For patients admitted to the hospital, discharge time will be determined by your treatment team.  Patients discharged the day of surgery will not be allowed to drive home.   Name and phone number of your driver:   family Special instructions:  Follow the diet and prep instructions given to you by Dr Olevia Perches office.  Please read over the following fact sheets that you were given. Anesthesia Post-op Instructions and Care and Recovery After Surgery       Colonoscopy, Adult A colonoscopy is an exam to look at the large intestine. It is done to check for problems, such as:  Lumps (tumors).  Growths (polyps).  Swelling (inflammation).  Bleeding.  What happens before the procedure? Eating and drinking Follow instructions from your doctor about eating and drinking. These instructions may include:  A few  days before the procedure - follow a low-fiber diet. ? Avoid nuts. ? Avoid seeds. ? Avoid dried fruit. ? Avoid raw fruits. ? Avoid vegetables.  1-3 days before the procedure - follow a clear liquid diet. Avoid liquids that have red or purple dye. Drink only clear liquids, such as: ? Clear broth or bouillon. ? Black coffee or tea. ? Clear juice. ? Clear soft drinks or sports drinks. ? Gelatin dessert. ? Popsicles.  On the day of the procedure - do not eat or drink anything during the 2 hours before the procedure.  Bowel prep If you were prescribed an oral bowel prep:  Take it as told by your doctor. Starting the day before your procedure, you will need to drink a lot of liquid. The liquid will cause you to poop (have bowel movements) until your poop is almost clear or light green.  If your skin or butt gets irritated from diarrhea, you may: ? Wipe the area with wipes that have medicine in them, such as adult wet wipes with aloe and vitamin E. ? Put something on your skin that soothes the area, such as petroleum jelly.  If you throw up (vomit) while drinking the bowel prep, take a break for up to 60 minutes. Then begin the bowel prep again. If you keep throwing  up and you cannot take the bowel prep without throwing up, call your doctor.  General instructions  Ask your doctor about changing or stopping your normal medicines. This is important if you take diabetes medicines or blood thinners.  Plan to have someone take you home from the hospital or clinic. What happens during the procedure?  An IV tube may be put into one of your veins.  You will be given medicine to help you relax (sedative).  To reduce your risk of infection: ? Your doctors will wash their hands. ? Your anal area will be washed with soap.  You will be asked to lie on your side with your knees bent.  Your doctor will get a long, thin, flexible tube ready. The tube will have a camera and a light on the  end.  The tube will be put into your anus.  The tube will be gently put into your large intestine.  Air will be delivered into your large intestine to keep it open. You may feel some pressure or cramping.  The camera will be used to take photos.  A small tissue sample may be removed from your body to be looked at under a microscope (biopsy). If any possible problems are found, the tissue will be sent to a lab for testing.  If small growths are found, your doctor may remove them and have them checked for cancer.  The tube that was put into your anus will be slowly removed. The procedure may vary among doctors and hospitals. What happens after the procedure?  Your doctor will check on you often until the medicines you were given have worn off.  Do not drive for 24 hours after the procedure.  You may have a small amount of blood in your poop.  You may pass gas.  You may have mild cramps or bloating in your belly (abdomen).  It is up to you to get the results of your procedure. Ask your doctor, or the department performing the procedure, when your results will be ready. This information is not intended to replace advice given to you by your health care provider. Make sure you discuss any questions you have with your health care provider. Document Released: 11/18/2010 Document Revised: 08/16/2016 Document Reviewed: 12/28/2015 Elsevier Interactive Patient Education  2017 [employer]  Colonoscopy, Adult, Care After This sheet gives you information about how to care for yourself after your procedure. Your health care provider may also give you more specific instructions. If you have problems or questions, contact your health care provider. What can I expect after the procedure? After the procedure, it is common to have:  A small amount of blood in your stool for 24 hours after the procedure.  Some gas.  Mild abdominal cramping or bloating.  Follow these instructions at  home: General instructions   For the first 24 hours after the procedure: ? Do not drive or use machinery. ? Do not sign important documents. ? Do not drink alcohol. ? Do your regular daily activities at a slower pace than normal. ? Eat soft, easy-to-digest foods. ? Rest often.  Take over-the-counter or prescription medicines only as told by your health care provider.  It is up to you to get the results of your procedure. Ask your health care provider, or the department performing the procedure, when your results will be ready. Relieving cramping and bloating  Try walking around when you have cramps or feel bloated.  Apply heat to your abdomen  as told by your health care provider. Use a heat source that your health care provider recommends, such as a moist heat pack or a heating pad. ? Place a towel between your skin and the heat source. ? Leave the heat on for 20-30 minutes. ? Remove the heat if your skin turns bright red. This is especially important if you are unable to feel pain, heat, or cold. You may have a greater risk of getting burned. Eating and drinking  Drink enough fluid to keep your urine clear or pale yellow.  Resume your normal diet as instructed by your health care provider. Avoid heavy or fried foods that are hard to digest.  Avoid drinking alcohol for as long as instructed by your health care provider. Contact a health care provider if:  You have blood in your stool 2-3 days after the procedure. Get help right away if:  You have more than a small spotting of blood in your stool.  You pass large blood clots in your stool.  Your abdomen is swollen.  You have nausea or vomiting.  You have a fever.  You have increasing abdominal pain that is not relieved with medicine. This information is not intended to replace advice given to you by your health care provider. Make sure you discuss any questions you have with your health care provider. Document Released:  05/30/2004 Document Revised: 07/10/2016 Document Reviewed: 12/28/2015 Elsevier Interactive Patient Education  2018 [place] Anesthesia is a term that refers to techniques, procedures, and medicines that help a person stay safe and comfortable during a medical procedure. Monitored anesthesia care, or sedation, is one type of anesthesia. Your anesthesia specialist may recommend sedation if you will be having a procedure that does not require you to be unconscious, such as:  Cataract surgery.  A dental procedure.  A biopsy.  A colonoscopy.  During the procedure, you may receive a medicine to help you relax (sedative). There are three levels of sedation:  Mild sedation. At this level, you may feel awake and relaxed. You will be able to follow directions.  Moderate sedation. At this level, you will be sleepy. You may not remember the procedure.  Deep sedation. At this level, you will be asleep. You will not remember the procedure.  The more medicine you are given, the deeper your level of sedation will be. Depending on how you respond to the procedure, the anesthesia specialist may change your level of sedation or the type of anesthesia to fit your needs. An anesthesia specialist will monitor you closely during the procedure. Let your health care provider know about:  Any allergies you have.  All medicines you are taking, including vitamins, herbs, eye drops, creams, and over-the-counter medicines.  Any use of steroids (by mouth or as a cream).  Any problems you or family members have had with sedatives and anesthetic medicines.  Any blood disorders you have.  Any surgeries you have had.  Any medical conditions you have, such as sleep apnea.  Whether you are pregnant or may be pregnant.  Any use of cigarettes, alcohol, or street drugs. What are the risks? Generally, this is a safe procedure. However, problems may occur, including:  Getting too  much medicine (oversedation).  Nausea.  Allergic reaction to medicines.  Trouble breathing. If this happens, a breathing tube may be used to help with breathing. It will be removed when you are awake and breathing on your own.  Heart trouble.  Lung  trouble.  Before the procedure Staying hydrated Follow instructions from your health care provider about hydration, which may include:  Up to 2 hours before the procedure - you may continue to drink clear liquids, such as water, clear fruit juice, black coffee, and plain tea.  Eating and drinking restrictions Follow instructions from your health care provider about eating and drinking, which may include:  8 hours before the procedure - stop eating heavy meals or foods such as meat, fried foods, or fatty foods.  6 hours before the procedure - stop eating light meals or foods, such as toast or cereal.  6 hours before the procedure - stop drinking milk or drinks that contain milk.  2 hours before the procedure - stop drinking clear liquids.  Medicines Ask your health care provider about:  Changing or stopping your regular medicines. This is especially important if you are taking diabetes medicines or blood thinners.  Taking medicines such as aspirin and ibuprofen. These medicines can thin your blood. Do not take these medicines before your procedure if your health care provider instructs you not to.  Tests and exams  You will have a physical exam.  You may have blood tests done to show: ? How well your kidneys and liver are working. ? How well your blood can clot.  General instructions  Plan to have someone take you home from the hospital or clinic.  If you will be going home right after the procedure, plan to have someone with you for 24 hours.  What happens during the procedure?  Your blood pressure, heart rate, breathing, level of pain and overall condition will be monitored.  An IV tube will be inserted into one of  your veins.  Your anesthesia specialist will give you medicines as needed to keep you comfortable during the procedure. This may mean changing the level of sedation.  The procedure will be performed. After the procedure  Your blood pressure, heart rate, breathing rate, and blood oxygen level will be monitored until the medicines you were given have worn off.  Do not drive for 24 hours if you received a sedative.  You may: ? Feel sleepy, clumsy, or nauseous. ? Feel forgetful about what happened after the procedure. ? Have a sore throat if you had a breathing tube during the procedure. ? Vomit. This information is not intended to replace advice given to you by your health care provider. Make sure you discuss any questions you have with your health care provider. Document Released: 07/12/2005 Document Revised: 03/24/2016 Document Reviewed: 02/06/2016 Elsevier Interactive Patient Education  2018 [place], Care After These instructions provide you with information about caring for yourself after your procedure. Your health care provider may also give you more specific instructions. Your treatment has been planned according to current medical practices, but problems sometimes occur. Call your health care provider if you have any problems or questions after your procedure. What can I expect after the procedure? After your procedure, it is common to:  Feel sleepy for several hours.  Feel clumsy and have poor balance for several hours.  Feel forgetful about what happened after the procedure.  Have poor judgment for several hours.  Feel nauseous or vomit.  Have a sore throat if you had a breathing tube during the procedure.  Follow these instructions at home: For at least 24 hours after the procedure:   Do not: ? Participate in activities in which you could fall or become injured. ?  Drive. ? Use heavy machinery. ? Drink alcohol. ? Take sleeping pills  or medicines that cause drowsiness. ? Make important decisions or sign legal documents. ? Take care of children on your own.  Rest. Eating and drinking  Follow the diet that is recommended by your health care provider.  If you vomit, drink water, juice, or soup when you can drink without vomiting.  Make sure you have little or no nausea before eating solid foods. General instructions  Have a responsible adult stay with you until you are awake and alert.  Take over-the-counter and prescription medicines only as told by your health care provider.  If you smoke, do not smoke without supervision.  Keep all follow-up visits as told by your health care provider. This is important. Contact a health care provider if:  You keep feeling nauseous or you keep vomiting.  You feel light-headed.  You develop a rash.  You have a fever. Get help right away if:  You have trouble breathing. This information is not intended to replace advice given to you by your health care provider. Make sure you discuss any questions you have with your health care provider. Document Released: 02/06/2016 Document Revised: 06/07/2016 Document Reviewed: 02/06/2016 Elsevier Interactive Patient Education  [employer].

## 2018-06-28 ENCOUNTER — Encounter (HOSPITAL_COMMUNITY)
Admission: RE | Admit: 2018-06-28 | Discharge: 2018-06-28 | Disposition: A | Discharge status: Home / Self Care | Payer: Medicaid Other | Source: Ambulatory Visit | Attending: Internal Medicine | Admitting: Internal Medicine

## 2018-07-02 ENCOUNTER — Encounter (HOSPITAL_COMMUNITY)
Admission: RE | Admit: 2018-07-02 | Discharge: 2018-07-02 | Disposition: A | Discharge status: Home / Self Care | Payer: Medicaid Other | Source: Ambulatory Visit | Attending: Internal Medicine | Admitting: Internal Medicine

## 2018-07-02 DIAGNOSIS — Z01810 Encounter for preprocedural cardiovascular examination: Secondary | ICD-10-CM | POA: Diagnosis present

## 2018-07-02 DIAGNOSIS — Z01812 Encounter for preprocedural laboratory examination: Secondary | ICD-10-CM | POA: Diagnosis present

## 2018-07-02 LAB — CBC WITH DIFFERENTIAL/PLATELET
BASOS ABS: 0 10*3/uL (ref 0.0–0.1)
Basophils Relative: 1 %
EOS ABS: 0.1 10*3/uL (ref 0.0–0.7)
EOS PCT: 1 %
HCT: 39.7 % (ref 39.0–52.0)
Hemoglobin: 13 g/dL (ref 13.0–17.0)
LYMPHS ABS: 1.5 10*3/uL (ref 0.7–4.0)
LYMPHS PCT: 23 %
MCH: 31.5 pg (ref 26.0–34.0)
MCHC: 32.7 g/dL (ref 30.0–36.0)
MCV: 96.1 fL (ref 78.0–100.0)
MONO ABS: 0.5 10*3/uL (ref 0.1–1.0)
Monocytes Relative: 8 %
Neutro Abs: 4.6 10*3/uL (ref 1.7–7.7)
Neutrophils Relative %: 67 %
PLATELETS: 235 10*3/uL (ref 150–400)
RBC: 4.13 MIL/uL — ABNORMAL LOW (ref 4.22–5.81)
RDW: 13.2 % (ref 11.5–15.5)
WBC: 6.7 10*3/uL (ref 4.0–10.5)

## 2018-07-02 LAB — BASIC METABOLIC PANEL
Anion gap: 8 (ref 5–15)
BUN: 21 mg/dL — AB (ref 6–20)
CO2: 21 mmol/L — ABNORMAL LOW (ref 22–32)
CREATININE: 1.56 mg/dL — AB (ref 0.61–1.24)
Calcium: 8.7 mg/dL — ABNORMAL LOW (ref 8.9–10.3)
Chloride: 112 mmol/L — ABNORMAL HIGH (ref 98–111)
GFR calc Af Amer: 54 mL/min — ABNORMAL LOW (ref >60)
GFR calc non Af Amer: 47 mL/min — ABNORMAL LOW (ref >60)
GLUCOSE: 148 mg/dL — AB (ref 70–99)
POTASSIUM: 3.6 mmol/L (ref 3.5–5.1)
Sodium: 141 mmol/L (ref 135–145)

## 2018-07-05 ENCOUNTER — Encounter (HOSPITAL_COMMUNITY)
Admission: RE | Disposition: A | Discharge status: Home / Self Care | Payer: Self-pay | Source: Ambulatory Visit | Attending: Internal Medicine

## 2018-07-05 ENCOUNTER — Ambulatory Visit (HOSPITAL_COMMUNITY): Payer: Medicaid Other | Admitting: Anesthesiology

## 2018-07-05 ENCOUNTER — Encounter (HOSPITAL_COMMUNITY): Payer: Self-pay | Admitting: *Deleted

## 2018-07-05 ENCOUNTER — Ambulatory Visit (HOSPITAL_COMMUNITY)
Admission: RE | Admit: 2018-07-05 | Discharge: 2018-07-05 | Disposition: A | Discharge status: Home / Self Care | Payer: Medicaid Other | Source: Ambulatory Visit | Attending: Internal Medicine | Admitting: Internal Medicine

## 2018-07-05 DIAGNOSIS — D120 Benign neoplasm of cecum: Secondary | ICD-10-CM

## 2018-07-05 DIAGNOSIS — F419 Anxiety disorder, unspecified: Secondary | ICD-10-CM | POA: Insufficient documentation

## 2018-07-05 DIAGNOSIS — E119 Type 2 diabetes mellitus without complications: Secondary | ICD-10-CM | POA: Diagnosis not present

## 2018-07-05 DIAGNOSIS — Z794 Long term (current) use of insulin: Secondary | ICD-10-CM | POA: Insufficient documentation

## 2018-07-05 DIAGNOSIS — I10 Essential (primary) hypertension: Secondary | ICD-10-CM | POA: Diagnosis not present

## 2018-07-05 DIAGNOSIS — F17210 Nicotine dependence, cigarettes, uncomplicated: Secondary | ICD-10-CM | POA: Diagnosis not present

## 2018-07-05 DIAGNOSIS — Z7982 Long term (current) use of aspirin: Secondary | ICD-10-CM | POA: Insufficient documentation

## 2018-07-05 DIAGNOSIS — F0390 Unspecified dementia without behavioral disturbance: Secondary | ICD-10-CM | POA: Diagnosis not present

## 2018-07-05 DIAGNOSIS — Z86010 Personal history of colonic polyps: Secondary | ICD-10-CM | POA: Diagnosis not present

## 2018-07-05 DIAGNOSIS — B1920 Unspecified viral hepatitis C without hepatic coma: Secondary | ICD-10-CM | POA: Diagnosis not present

## 2018-07-05 DIAGNOSIS — D123 Benign neoplasm of transverse colon: Secondary | ICD-10-CM | POA: Insufficient documentation

## 2018-07-05 DIAGNOSIS — Z79899 Other long term (current) drug therapy: Secondary | ICD-10-CM | POA: Insufficient documentation

## 2018-07-05 DIAGNOSIS — F209 Schizophrenia, unspecified: Secondary | ICD-10-CM | POA: Insufficient documentation

## 2018-07-05 DIAGNOSIS — K635 Polyp of colon: Secondary | ICD-10-CM | POA: Diagnosis not present

## 2018-07-05 HISTORY — PX: POLYPECTOMY: SHX5525

## 2018-07-05 HISTORY — PX: COLONOSCOPY WITH PROPOFOL: SHX5780

## 2018-07-05 LAB — GLUCOSE, CAPILLARY
Glucose-Capillary: 112 mg/dL — ABNORMAL HIGH (ref 70–99)
Glucose-Capillary: 110 mg/dL — ABNORMAL HIGH (ref 70–99)

## 2018-07-05 SURGERY — COLONOSCOPY WITH PROPOFOL
Anesthesia: Monitor Anesthesia Care

## 2018-07-05 MED ORDER — PROPOFOL 10 MG/ML IV BOLUS
INTRAVENOUS | Status: AC
  Filled 2018-07-05: qty 20

## 2018-07-05 MED ORDER — HYDROMORPHONE HCL 1 MG/ML IJ SOLN: 0.2500 mg | INTRAMUSCULAR | Status: DC | PRN

## 2018-07-05 MED ORDER — GLYCOPYRROLATE 0.2 MG/ML IJ SOLN
INTRAMUSCULAR | Status: AC
  Filled 2018-07-05: qty 1

## 2018-07-05 MED ORDER — CHLORHEXIDINE GLUCONATE CLOTH 2 % EX PADS: 6.0000 | MEDICATED_PAD | Freq: Once | CUTANEOUS | Status: DC

## 2018-07-05 MED ORDER — PROPOFOL 500 MG/50ML IV EMUL
INTRAVENOUS | Status: DC | PRN
  Administered 2018-07-05: 150 ug/kg/min via INTRAVENOUS
  Administered 2018-07-05 (×2): via INTRAVENOUS

## 2018-07-05 MED ORDER — PROPOFOL 10 MG/ML IV BOLUS
INTRAVENOUS | Status: AC
  Filled 2018-07-05: qty 40

## 2018-07-05 MED ORDER — HYDROCODONE-ACETAMINOPHEN 7.5-325 MG PO TABS: 1.0000 | ORAL_TABLET | Freq: Once | ORAL | Status: DC | PRN

## 2018-07-05 MED ORDER — LACTATED RINGERS IV SOLN
INTRAVENOUS | Status: DC
  Administered 2018-07-05: 1000 mL via INTRAVENOUS

## 2018-07-05 MED ORDER — PROPOFOL 10 MG/ML IV BOLUS
INTRAVENOUS | Status: DC | PRN
  Administered 2018-07-05: 20 mg via INTRAVENOUS
  Administered 2018-07-05: 30 mg via INTRAVENOUS

## 2018-07-05 MED ORDER — PROMETHAZINE HCL 25 MG/ML IJ SOLN: 6.2500 mg | INTRAMUSCULAR | Status: DC | PRN

## 2018-07-05 MED ORDER — LACTATED RINGERS IV SOLN: INTRAVENOUS | Status: DC

## 2018-07-05 MED ORDER — MEPERIDINE HCL 100 MG/ML IJ SOLN: 6.2500 mg | INTRAMUSCULAR | Status: DC | PRN

## 2018-07-05 NOTE — Op Note (Signed)
[facility] Patient Name: Charles [name] Procedure Date: 07/05/2018 9:51 AM MRN: [id number] Date of Birth: [birth year] Attending MD: Hildred Laser , MD CSN: [id number] Age: 62 Admit Type: Outpatient Procedure:                Colonoscopy Indications:              Therapeutic procedure for colon polyps Providers:                Hildred Laser, MD, Otis Peak B. Sharon Seller, RN, Randa Spike, Technician Referring MD:             Rosita Fire, MD Medicines:                Propofol per Anesthesia Complications:            No immediate complications. Estimated Blood Loss:     Estimated blood loss was minimal. Procedure:                Pre-Anesthesia Assessment:                           - Prior to the procedure, a History and Physical                            was performed, and patient medications and                            allergies were reviewed. The patient's tolerance of                            previous anesthesia was also reviewed. The risks                            and benefits of the procedure and the sedation                            options and risks were discussed with the patient.                            All questions were answered, and informed consent                            was obtained. Prior Anticoagulants: The patient                            last took aspirin 3 days prior to the procedure.                            ASA Grade Assessment: III - A patient with severe                            systemic disease. After reviewing the risks and  benefits, the patient was deemed in satisfactory                            condition to undergo the procedure.                           After obtaining informed consent, the colonoscope                            was passed under direct vision. Throughout the                            procedure, the patient's blood pressure, pulse, and   oxygen saturations were monitored continuously. The                            [id number]-[id number] ([id number]) scope was introduced through                            the and advanced to the the cecum, identified by                            appendiceal orifice and ileocecal valve. The                            colonoscopy was performed without difficulty. The                            colonoscopy was technically difficult and complex                            due to a tortuous colon. The patient tolerated the                            procedure well. The quality of the bowel                            preparation was adequate. The ileocecal valve,                            appendiceal orifice, and rectum were photographed. Scope In: 10:20:25 AM Scope Out: 11:10:05 AM Scope Withdrawal Time: 0 hours 45 minutes 11 seconds  Total Procedure Duration: 0 hours 49 minutes 40 seconds  Findings:      The perianal and digital rectal examinations were normal.      Three polyps were found in the cecum. The polyps were 7 to 15 mm in       size. These polyps were removed with a hot snare. Resection was       complete, but the polyp tissue was only partially retrieved. The       pathology specimen was placed into Bottle Number 1.      Two semi-pedunculated polyps were found in the splenic flexure and       transverse colon. The polyps were 8 to 10 mm in size. These polyps were  removed with a hot snare. Resection and retrieval were complete. The       pathology specimen was placed into Bottle Number 2.      A small polyp was found in the transverse colon. The polyp was sessile.       The polyp was removed with a cold snare. Resection and retrieval were       complete. The pathology specimen was placed into Bottle Number 2.      The exam was otherwise normal throughout the examined colon. Impression:               - Three 7 to 15 mm polyps in the cecum, removed                            with a hot  snare. Complete resection. Partial                            retrieval.                           - Two 8 to 10 mm polyps at the splenic flexure and                            in the transverse colon, removed with a hot snare.                            Resected and retrieved.                           - One small polyp in the transverse colon, removed                            with a cold snare. Resected and retrieved. Moderate Sedation:      Per Anesthesia Care Recommendation:           - Patient has a contact number available for                            emergencies. The signs and symptoms of potential                            delayed complications were discussed with the                            patient. Return to normal activities tomorrow.                            Written discharge instructions were provided to the                            patient.                           - Diabetic (ADA) diet today.                           -  Continue present medications.                           - No aspirin, ibuprofen, naproxen, or other                            non-steroidal anti-inflammatory drugs for 10 days                            after polyp removal.                           - Await pathology results.                           - Repeat colonoscopy in 3 years for surveillance. Procedure Code(s):        --- Professional ---                           [id number], Colonoscopy, flexible; with removal of                            tumor(s), polyp(s), or other lesion(s) by snare                            technique Diagnosis Code(s):        --- Professional ---                           D12.0, Benign neoplasm of cecum                           D12.3, Benign neoplasm of transverse colon (hepatic                            flexure or splenic flexure)                           K63.5, Polyp of colon CPT copyright 2017 American Medical Association. All rights reserved. The codes  documented in this report are preliminary and upon coder review may  be revised to meet current compliance requirements. Hildred Laser, MD Hildred Laser, MD 07/05/2018 11:20:12 AM This report has been signed electronically. Number of Addenda: 0

## 2018-07-05 NOTE — Anesthesia Preprocedure Evaluation (Signed)
Anesthesia Evaluation  Patient identified by MRN, date of birth, ID band Patient awake    Reviewed: Allergy & Precautions, H&P , NPO status , Patient's Chart, lab work & pertinent test results, reviewed documented beta blocker date and time   Airway Mallampati: II  TM Distance: >3 FB Neck ROM: full    Dental no notable dental hx. (+) Dental Advidsory Given   Pulmonary neg pulmonary ROS, Current Smoker,    Pulmonary exam normal breath sounds clear to auscultation       Cardiovascular Exercise Tolerance: Good hypertension, On Medications negative cardio ROS   Rhythm:regular Rate:Normal     Neuro/Psych negative neurological ROS  negative psych ROS   GI/Hepatic negative GI ROS, Neg liver ROS, (+) Hepatitis -, C  Endo/Other  negative endocrine ROSdiabetes, Type 2  Renal/GU negative Renal ROS  negative genitourinary   Musculoskeletal   Abdominal   Peds  Hematology negative hematology ROS (+) anemia ,   Anesthesia Other Findings Repeat procedure- no anesthesia from last attempt few months ago  Reproductive/Obstetrics negative OB ROS                             Anesthesia Physical Anesthesia Plan  ASA: III  Anesthesia Plan: MAC   Post-op Pain Management:    Induction:   PONV Risk Score and Plan:   Airway Management Planned:   Additional Equipment:   Intra-op Plan:   Post-operative Plan:   Informed Consent: I have reviewed the patients History and Physical, chart, labs and discussed the procedure including the risks, benefits and alternatives for the proposed anesthesia with the patient or authorized representative who has indicated his/her understanding and acceptance.   Dental Advisory Given  Plan Discussed with: CRNA and Anesthesiologist  Anesthesia Plan Comments:         Anesthesia Quick Evaluation

## 2018-07-05 NOTE — H&P (Signed)
Charles [name] is an 62 y.o. male.   Chief Complaint: Patient is here for colonoscopy with polypectomy. HPI: Patient is 62 year old Afro-American male who underwent screening colonoscopy in June 2019 with removal of multiple polyps.  3 of the polyps had high-grade dysplasia.  He had 2 more cecal polyps which could not be removed because of poor approach.  He is therefore returning for repeat procedure to remove these polyps.  He denies abdominal pain or rectal bleeding.  He has good appetite and his weight has been stable. Family history is negative for CRC.  Preop blood work reviewed with patient.  His serum creatinine is elevated. Serum creatinine was normal 1 and 2 years ago but abnormal back in 2012 and 2013.  Patient will follow-up with Dr. Legrand Rams.  Past Medical History:  Diagnosis Date  . Anemia   . Anxiety   . Chronic back pain   . Dementia    Nursing facility feels he has Dementia  . Diabetes mellitus   . Fracture of right foot   . Hypertension   . Mental disorder    schizophrenia    Past Surgical History:  Procedure Laterality Date  . COLONOSCOPY N/A 04/11/2018   Procedure: COLONOSCOPY;  Surgeon: Charles Houston, MD;  Location: [facility];  Service: Endoscopy;  Laterality: N/A;  10:30  . left 1st toe    . ORIF TOE FRACTURE  09/05/2011   Procedure: OPEN REDUCTION INTERNAL FIXATION (ORIF) METATARSAL (TOE) FRACTURE;  Surgeon: Charles [name];  Location: [place];  Service: Orthopedics;  Laterality: Left;  reconstruction left great toe FHB plantar plate  . POLYPECTOMY  04/11/2018   Procedure: POLYPECTOMY;  Surgeon: Charles Houston, MD;  Location: [facility];  Service: Endoscopy;;  colon  . right foot suger      Family History  Problem Relation Age of Onset  . Colon cancer Neg Hx   . Liver disease Neg Hx        unknown for sure, mom died at age 51   Social History:  reports that he has been smoking cigarettes. He has a 20.00 pack-year smoking history.  He has never used smokeless tobacco. He reports that he drinks alcohol. He reports that he does not use drugs.  Allergies: No Known Allergies  Medications Prior to Admission  Medication Sig Dispense Refill  . aspirin EC 81 MG tablet Take 1 tablet (81 mg total) by mouth daily with breakfast.    . cyclobenzaprine (FLEXERIL) 10 MG tablet Take 10 mg by mouth daily.    . ferrous sulfate 325 (65 FE) MG tablet Take 325 mg by mouth daily with breakfast.    . furosemide (LASIX) 40 MG tablet Take 40 mg by mouth daily with breakfast.    . gabapentin (NEURONTIN) 600 MG tablet Take 600 mg by mouth 3 (three) times daily.     Marland Kitchen lisinopril (PRINIVIL,ZESTRIL) 10 MG tablet Take 10 mg by mouth daily.    . potassium chloride (K-DUR,KLOR-CON) 10 MEQ tablet Take 20 mEq by mouth daily.    . pravastatin (PRAVACHOL) 40 MG tablet Take 40 mg by mouth daily.     . tamsulosin (FLOMAX) 0.4 MG CAPS capsule Take 0.4 mg by mouth.    . insulin detemir (LEVEMIR) 100 UNIT/ML injection Inject 50 Units into the skin daily.    . insulin lispro (HUMALOG) 100 UNIT/ML injection Inject 5 Units into the skin 3 (three) times daily before meals.      Results  for orders placed or performed during the hospital encounter of 07/05/18 (from the past 48 hour(s))  Glucose, capillary     Status: Abnormal   Collection Time: 07/05/18  9:06 AM  Result Value Ref Range   Glucose-Capillary 112 (H) 70 - 99 mg/dL   No results found.  ROS  Blood pressure 139/86, temperature 97.6 F (36.4 C), temperature source Oral, resp. rate (!) 22, SpO2 96 %. Physical Exam  Constitutional: He appears well-developed and well-nourished.  HENT:  Mouth/Throat: Oropharynx is clear and moist.  Eyes: Conjunctivae are normal. No scleral icterus.  Neck: No thyromegaly present.  Cardiovascular: Normal rate, regular rhythm and normal heart sounds.  No murmur heard. Respiratory: Effort normal and breath sounds normal.  GI: Soft. He exhibits no distension and no  mass. There is no tenderness.  Musculoskeletal: He exhibits no edema.  Lymphadenopathy:    He has no cervical adenopathy.  Skin: Skin is warm and dry.     Assessment/Plan History of colonic adenomas. Therapeutic colonoscopy.  Charles Laser, MD 07/05/2018, 10:08 AM

## 2018-07-05 NOTE — Anesthesia Postprocedure Evaluation (Signed)
Anesthesia Post Note  Patient: Charles [name]  Procedure(s) Performed: COLONOSCOPY WITH PROPOFOL (N/A ) POLYPECTOMY  Patient location during evaluation: PACU Anesthesia Type: MAC Level of consciousness: awake and alert and oriented Pain management: pain level controlled Vital Signs Assessment: post-procedure vital signs reviewed and stable Respiratory status: spontaneous breathing Cardiovascular status: stable Postop Assessment: no apparent nausea or vomiting Anesthetic complications: no     Last Vitals:  Vitals:   07/05/18 0930 07/05/18 0935  BP:  139/86  Resp:    Temp:    SpO2: 95% 96%    Last Pain:  Vitals:   07/05/18 1013  TempSrc:   PainSc: 0-No pain                 ADAMS, AMY A

## 2018-07-05 NOTE — Anesthesia Procedure Notes (Signed)
Procedure Name: [place] Performed by: Andree Elk Amy A, CRNA Pre-anesthesia Checklist: Emergency Drugs available, Patient identified, Suction available, Patient being monitored and Timeout performed Oxygen Delivery Method: Simple face mask

## 2018-07-05 NOTE — Discharge Instructions (Signed)
No aspirin or NSAIDs for 10 days. Resume other medications as before. Resume diet as before. No driving for 24 hours. Physician will call with biopsy results.  Colonoscopy, Adult, Care After This sheet gives you information about how to care for yourself after your procedure. Your doctor may also give you more specific instructions. If you have problems or questions, call your doctor. Follow these instructions at home: General instructions   For the first 24 hours after the procedure: ? Do not drive or use machinery. ? Do not sign important documents. ? Do not drink alcohol. ? Do your daily activities more slowly than normal. ? Eat foods that are soft and easy to digest. ? Rest often.  Take over-the-counter or prescription medicines only as told by your doctor.  It is up to you to get the results of your procedure. Ask your doctor, or the department performing the procedure, when your results will be ready. To help cramping and bloating:  Try walking around.  Put heat on your belly (abdomen) as told by your doctor. Use a heat source that your doctor recommends, such as a moist heat pack or a heating pad. ? Put a towel between your skin and the heat source. ? Leave the heat on for 20-30 minutes. ? Remove the heat if your skin turns bright red. This is especially important if you cannot feel pain, heat, or cold. You can get burned. Eating and drinking  Drink enough fluid to keep your pee (urine) clear or pale yellow.  Return to your normal diet as told by your doctor. Avoid heavy or fried foods that are hard to digest.  Avoid drinking alcohol for as long as told by your doctor. Contact a doctor if:  You have blood in your poop (stool) 2-3 days after the procedure. Get help right away if:  You have more than a small amount of blood in your poop.  You see large clumps of tissue (blood clots) in your poop.  Your belly is swollen.  You feel sick to your stomach  (nauseous).  You throw up (vomit).  You have a fever.  You have belly pain that gets worse, and medicine does not help your pain. This information is not intended to replace advice given to you by your health care provider. Make sure you discuss any questions you have with your health care provider. Document Released: 11/18/2010 Document Revised: 07/10/2016 Document Reviewed: 07/10/2016 Elsevier Interactive Patient Education  2017 [employer].  Colon Polyps Polyps are tissue growths inside the body. Polyps can grow in many places, including the large intestine (colon). A polyp may be a round bump or a mushroom-shaped growth. You could have one polyp or several. Most colon polyps are noncancerous (benign). However, some colon polyps can become cancerous over time. What are the causes? The exact cause of colon polyps is not known. What increases the risk? This condition is more likely to develop in people who:  Have a family history of colon cancer or colon polyps.  Are older than 47 or older than 45 if they are African American.  Have inflammatory bowel disease, such as ulcerative colitis or Crohn disease.  Are overweight.  Smoke cigarettes.  Do not get enough exercise.  Drink too much alcohol.  Eat a diet that is: ? High in fat and red meat. ? Low in fiber.  Had childhood cancer that was treated with abdominal radiation.  What are the signs or symptoms? Most polyps do not cause  symptoms. If you have symptoms, they may include:  Blood coming from your rectum when having a bowel movement.  Blood in your stool.The stool may look dark red or black.  A change in bowel habits, such as constipation or diarrhea.  How is this diagnosed? This condition is diagnosed with a colonoscopy. This is a procedure that uses a lighted, flexible scope to look at the inside of your colon. How is this treated? Treatment for this condition involves removing any polyps that are found.  Those polyps will then be tested for cancer. If cancer is found, your health care provider will talk to you about options for colon cancer treatment. Follow these instructions at home: Diet  Eat plenty of fiber, such as fruits, vegetables, and whole grains.  Eat foods that are high in calcium and vitamin D, such as milk, cheese, yogurt, eggs, liver, fish, and broccoli.  Limit foods high in fat, red meats, and processed meats, such as hot dogs, sausage, bacon, and lunch meats.  Maintain a healthy weight, or lose weight if recommended by your health care provider. General instructions  Do not smoke cigarettes.  Do not drink alcohol excessively.  Keep all follow-up visits as told by your health care provider. This is important. This includes keeping regularly scheduled colonoscopies. Talk to your health care provider about when you need a colonoscopy.  Exercise every day or as told by your health care provider. Contact a health care provider if:  You have new or worsening bleeding during a bowel movement.  You have new or increased blood in your stool.  You have a change in bowel habits.  You unexpectedly lose weight. This information is not intended to replace advice given to you by your health care provider. Make sure you discuss any questions you have with your health care provider. Document Released: 07/12/2004 Document Revised: 03/23/2016 Document Reviewed: 09/06/2015 Elsevier Interactive Patient Education  [employer].

## 2018-07-05 NOTE — Transfer of Care (Signed)
Immediate Anesthesia Transfer of Care Note  Patient: Charles [name]  Procedure(s) Performed: COLONOSCOPY WITH PROPOFOL (N/A ) POLYPECTOMY  Patient Location: PACU  Anesthesia Type:MAC  Level of Consciousness: awake, alert , oriented and patient cooperative  Airway & Oxygen Therapy: Patient Spontanous Breathing  Post-op Assessment: Report given to RN and Post -op Vital signs reviewed and stable  Post vital signs: Reviewed and stable  Last Vitals:  Vitals Value Taken Time  BP 130/87 07/05/2018 11:18 AM  Temp    Pulse 77 07/05/2018 11:20 AM  Resp 21 07/05/2018 11:20 AM  SpO2 99 % 07/05/2018 11:20 AM  Vitals shown include unvalidated device data.  Last Pain:  Vitals:   07/05/18 1013  TempSrc:   PainSc: 0-No pain      Patients Stated Pain Goal: 8 (97/67/34 [id number])  Complications: No apparent anesthesia complications

## 2018-07-10 ENCOUNTER — Encounter (HOSPITAL_COMMUNITY): Payer: Self-pay | Admitting: Internal Medicine

## 2018-10-15 ENCOUNTER — Encounter: Payer: Self-pay | Admitting: Sports Medicine

## 2018-10-15 ENCOUNTER — Ambulatory Visit (INDEPENDENT_AMBULATORY_CARE_PROVIDER_SITE_OTHER): Payer: Medicaid Other | Admitting: Sports Medicine

## 2018-10-15 ENCOUNTER — Other Ambulatory Visit: Payer: Self-pay | Admitting: Sports Medicine

## 2018-10-15 ENCOUNTER — Ambulatory Visit (INDEPENDENT_AMBULATORY_CARE_PROVIDER_SITE_OTHER): Payer: Medicaid Other

## 2018-10-15 VITALS — BP 160/86 | HR 84 | Resp 16

## 2018-10-15 DIAGNOSIS — L84 Corns and callosities: Secondary | ICD-10-CM

## 2018-10-15 DIAGNOSIS — M2012 Hallux valgus (acquired), left foot: Secondary | ICD-10-CM

## 2018-10-15 DIAGNOSIS — B351 Tinea unguium: Secondary | ICD-10-CM | POA: Diagnosis not present

## 2018-10-15 DIAGNOSIS — E1142 Type 2 diabetes mellitus with diabetic polyneuropathy: Secondary | ICD-10-CM | POA: Diagnosis not present

## 2018-10-15 DIAGNOSIS — M79676 Pain in unspecified toe(s): Secondary | ICD-10-CM

## 2018-10-15 DIAGNOSIS — M2011 Hallux valgus (acquired), right foot: Secondary | ICD-10-CM

## 2018-10-15 NOTE — Patient Instructions (Signed)

## 2018-10-15 NOTE — Progress Notes (Signed)
Subjective: Charles [name] is a 62 y.o. male patient with history of diabetes who presents to office today complaining of long,mildly painful nails while ambulating in shoes; unable to trim. Patient states that the glucose reading this morning was not recorded. Patient denies any new changes in medication or new problems. Patient admits cramping, numbness, burning or tingling in the legs and pain to bunion on left which is callus at the site.  Review of Systems  Neurological: Positive for tingling.  All other systems reviewed and are negative.    Patient Active Problem List   Diagnosis Date Noted  . History of colonic polyps 06/14/2018  . Encounter for screening colonoscopy 02/08/2018  . Diabetes ([place]) 07/08/2015  . High cholesterol 07/08/2015  . Hepatitis C 05/25/2015   Current Outpatient Medications on File Prior to Visit  Medication Sig Dispense Refill  . aspirin EC 81 MG tablet Take 1 tablet (81 mg total) by mouth daily with breakfast.    . cyclobenzaprine (FLEXERIL) 10 MG tablet Take 10 mg by mouth daily.    . ferrous sulfate 325 (65 FE) MG tablet Take 325 mg by mouth daily with breakfast.    . furosemide (LASIX) 40 MG tablet Take 40 mg by mouth daily with breakfast.    . gabapentin (NEURONTIN) 600 MG tablet Take 600 mg by mouth 3 (three) times daily.     . insulin detemir (LEVEMIR) 100 UNIT/ML injection Inject 50 Units into the skin daily.    . insulin lispro (HUMALOG) 100 UNIT/ML injection Inject 5 Units into the skin 3 (three) times daily before meals.    Marland Kitchen lisinopril (PRINIVIL,ZESTRIL) 10 MG tablet Take 10 mg by mouth daily.    . potassium chloride (K-DUR,KLOR-CON) 10 MEQ tablet Take 20 mEq by mouth daily.    . pravastatin (PRAVACHOL) 40 MG tablet Take 40 mg by mouth daily.     . tamsulosin (FLOMAX) 0.4 MG CAPS capsule Take 0.4 mg by mouth.     No current facility-administered medications on file prior to visit.    No Known Allergies  No results found for this or any  previous visit (from the past 2160 hour(s)).  Objective: General: Patient is awake, alert, and oriented x 3 and in no acute distress.  Integument: Skin is warm, dry and supple bilateral. Nails are tender, long, thickened and  dystrophic with subungual debris, consistent with onychomycosis, 1-5 bilateral. No signs of infection. No open lesions or preulcerative lesions present bilateral. Callus 1st toe and sub met 1 bilateral, Remaining integument unremarkable.  Vasculature:  Dorsalis Pedis pulse 1/4 bilateral. Posterior Tibial pulse  1/4 bilateral.  Capillary fill time <5 sec 1-5 bilateral.No hair growth to the level of the digits. Temperature gradient within normal limits. No varicosities present bilateral. No edema present bilateral.   Neurology: The patient has intact sensation measured with a 5.07/10g Semmes Weinstein Monofilament at all pedal sites bilateral . Vibratory sensation diminished bilateral with tuning fork. No Babinski sign present bilateral.   Musculoskeletal: BUNION ON LEFT,  Asymptomatic pedal deformities noted bilateral. Muscular strength 5/5 in all lower extremity muscular groups bilateral without pain on range of motion . No tenderness with calf compression bilateral.  Assessment and Plan: Problem List Items Addressed This Visit    None    Visit Diagnoses    Valgus deformity of both great toes    -  Primary   Dermatophytosis of nail       Callus of foot  Diabetic polyneuropathy associated with type 2 diabetes mellitus ([place])          -Examined patient. -Discussed and educated patient on diabetic foot care, especially with  regards to the vascular, neurological and musculoskeletal systems.  -Stressed the importance of good glycemic control and the detriment of not  controlling glucose levels in relation to the foot. -Mechanically debrided all nails 1-5 bilateral using sterile nail nipper and filed with dremel without incident and callus using chisel blade x4   -Dispensed bunion shield for left  -Answered all patient questions -Patient to return  in 3 months for at risk foot care -Patient advised to call the office if any problems or questions arise in the meantime.  Landis Martins, DPM

## 2019-01-13 ENCOUNTER — Ambulatory Visit: Payer: Medicaid Other | Admitting: Podiatry

## 2019-02-25 NOTE — Progress Notes (Signed)
REVIEWED-NO ADDITIONAL RECOMMENDATIONS. 

## 2019-06-17 ENCOUNTER — Other Ambulatory Visit: Payer: Self-pay

## 2019-06-17 DIAGNOSIS — Z20822 Contact with and (suspected) exposure to covid-19: Secondary | ICD-10-CM

## 2019-06-18 LAB — NOVEL CORONAVIRUS, NAA: SARS-CoV-2, NAA: NOT DETECTED

## 2019-06-24 ENCOUNTER — Ambulatory Visit: Payer: Medicaid Other | Admitting: Podiatry

## 2019-07-15 ENCOUNTER — Ambulatory Visit (INDEPENDENT_AMBULATORY_CARE_PROVIDER_SITE_OTHER): Payer: Medicaid Other | Admitting: Nurse Practitioner

## 2019-07-15 NOTE — Progress Notes (Deleted)
   Subjective:    Patient ID: Charles [name], male    DOB: [birth year], 63 y.o.   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  3 of the polyps had high-grade dysplasia.  He had 2 more cecal polyps which could not be removed because of poor approach.  He is therefore returning for repeat procedure to remove these polyps.  He denies abdominal pain or rectal bleeding.  He has good appetite and his weight has been stable.  Colonoscopy 07/05/2018:  - Three 7 to 15 mm polyps in the cecum, removed with a hot snare. Complete resection. Partial retrieval. - Two 8 to 10 mm polyps at the splenic flexure and in the transverse colon, removed with a hot snare. Resected and retrieved. - One small polyp in the transverse colon, removed with a cold snare. Resected and Retrieved. Repeat colonoscopy 3 yrs.  Colonoscopy 04/11/2018:  - Preparation of the colon was fair. - Three 6 to 10 mm polyps in the cecum, removed with a hot snare. Resected and retrieved. - Two 8 to 12 mm polyps in the ascending colon, removed with a hot snare. Resected and retrieved. - One 15 mm polyp in the proximal sigmoid colon, removed with a hot snare. Resected and retrieved. - One 15 mm polyp in the distal descending colon, removed with a hot snare. Resected and retrieved. - Two 8 to 20 mm, non-bleeding polyps in the cecum. Resection not attempted because of poor approach.  Review of Systems     Objective:   Physical Exam        Assessment & Plan:

## 2019-07-18 ENCOUNTER — Other Ambulatory Visit: Payer: Self-pay | Admitting: Internal Medicine

## 2019-07-18 DIAGNOSIS — N183 Chronic kidney disease, stage 3 unspecified: Secondary | ICD-10-CM

## 2019-07-23 ENCOUNTER — Ambulatory Visit (HOSPITAL_COMMUNITY)
Admission: RE | Admit: 2019-07-23 | Discharge: 2019-07-23 | Disposition: A | Discharge status: Home / Self Care | Payer: Medicaid Other | Source: Ambulatory Visit | Attending: Internal Medicine | Admitting: Internal Medicine

## 2019-07-23 ENCOUNTER — Other Ambulatory Visit: Payer: Self-pay

## 2019-07-23 DIAGNOSIS — N183 Chronic kidney disease, stage 3 unspecified: Secondary | ICD-10-CM

## 2019-08-05 ENCOUNTER — Other Ambulatory Visit: Payer: Self-pay | Admitting: Vascular Surgery

## 2019-08-05 DIAGNOSIS — E1369 Other specified diabetes mellitus with other specified complication: Secondary | ICD-10-CM

## 2019-08-25 ENCOUNTER — Other Ambulatory Visit: Payer: Self-pay

## 2019-08-25 ENCOUNTER — Ambulatory Visit (INDEPENDENT_AMBULATORY_CARE_PROVIDER_SITE_OTHER): Payer: Medicaid Other | Admitting: Podiatry

## 2019-08-25 ENCOUNTER — Encounter: Payer: Self-pay | Admitting: Podiatry

## 2019-08-25 DIAGNOSIS — M79674 Pain in right toe(s): Secondary | ICD-10-CM | POA: Diagnosis not present

## 2019-08-25 DIAGNOSIS — M79675 Pain in left toe(s): Secondary | ICD-10-CM

## 2019-08-25 DIAGNOSIS — B351 Tinea unguium: Secondary | ICD-10-CM

## 2019-08-25 DIAGNOSIS — E1142 Type 2 diabetes mellitus with diabetic polyneuropathy: Secondary | ICD-10-CM

## 2019-08-25 NOTE — Patient Instructions (Signed)
Diabetes Mellitus and Foot Care Foot care is an important part of your health, especially when you have diabetes. Diabetes may cause you to have problems because of poor blood flow (circulation) to your feet and legs, which can cause your skin to:  Become thinner and drier.  Break more easily.  Heal more slowly.  Peel and crack. You may also have nerve damage (neuropathy) in your legs and feet, causing decreased feeling in them. This means that you may not notice minor injuries to your feet that could lead to more serious problems. Noticing and addressing any potential problems early is the best way to prevent future foot problems. How to care for your feet Foot hygiene  Wash your feet daily with warm water and mild soap. Do not use hot water. Then, pat your feet and the areas between your toes until they are completely dry. Do not soak your feet as this can dry your skin.  Trim your toenails straight across. Do not dig under them or around the cuticle. File the edges of your nails with an emery board or nail file.  Apply a moisturizing lotion or petroleum jelly to the skin on your feet and to dry, brittle toenails. Use lotion that does not contain alcohol and is unscented. Do not apply lotion between your toes. Shoes and socks  Wear clean socks or stockings every day. Make sure they are not too tight. Do not wear knee-high stockings since they may decrease blood flow to your legs.  Wear shoes that fit properly and have enough cushioning. Always look in your shoes before you put them on to be sure there are no objects inside.  To break in new shoes, wear them for just a few hours a day. This prevents injuries on your feet. Wounds, scrapes, corns, and calluses  Check your feet daily for blisters, cuts, bruises, sores, and redness. If you cannot see the bottom of your feet, use a mirror or ask someone for help.  Do not cut corns or calluses or try to remove them with medicine.  If you  find a minor scrape, cut, or break in the skin on your feet, keep it and the skin around it clean and dry. You may clean these areas with mild soap and water. Do not clean the area with peroxide, alcohol, or iodine.  If you have a wound, scrape, corn, or callus on your foot, look at it several times a day to make sure it is healing and not infected. Check for: ? Redness, swelling, or pain. ? Fluid or blood. ? Warmth. ? Pus or a bad smell. General instructions  Do not cross your legs. This may decrease blood flow to your feet.  Do not use heating pads or hot water bottles on your feet. They may burn your skin. If you have lost feeling in your feet or legs, you may not know this is happening until it is too late.  Protect your feet from hot and cold by wearing shoes, such as at the beach or on hot pavement.  Schedule a complete foot exam at least once a year (annually) or more often if you have foot problems. If you have foot problems, report any cuts, sores, or bruises to your health care provider immediately. Contact a health care provider if:  You have a medical condition that increases your risk of infection and you have any cuts, sores, or bruises on your feet.  You have an injury that is not   healing.  You have redness on your legs or feet.  You feel burning or tingling in your legs or feet.  You have pain or cramps in your legs and feet.  Your legs or feet are numb.  Your feet always feel cold.  You have pain around a toenail. Get help right away if:  You have a wound, scrape, corn, or callus on your foot and: ? You have pain, swelling, or redness that gets worse. ? You have fluid or blood coming from the wound, scrape, corn, or callus. ? Your wound, scrape, corn, or callus feels warm to the touch. ? You have pus or a bad smell coming from the wound, scrape, corn, or callus. ? You have a fever. ? You have a red line going up your leg. Summary  Check your feet every day  for cuts, sores, red spots, swelling, and blisters.  Moisturize feet and legs daily.  Wear shoes that fit properly and have enough cushioning.  If you have foot problems, report any cuts, sores, or bruises to your health care provider immediately.  Schedule a complete foot exam at least once a year (annually) or more often if you have foot problems. This information is not intended to replace advice given to you by your health care provider. Make sure you discuss any questions you have with your health care provider. Document Released: 10/13/2000 Document Revised: 11/28/2017 Document Reviewed: 11/17/2016 Elsevier Patient Education  2020 [employer]  

## 2019-08-26 ENCOUNTER — Ambulatory Visit (HOSPITAL_COMMUNITY)
Admission: RE | Admit: 2019-08-26 | Discharge: 2019-08-26 | Disposition: A | Discharge status: Home / Self Care | Payer: Medicaid Other | Source: Ambulatory Visit | Attending: Family | Admitting: Family

## 2019-08-26 ENCOUNTER — Encounter: Payer: Self-pay | Admitting: Vascular Surgery

## 2019-08-26 ENCOUNTER — Ambulatory Visit (INDEPENDENT_AMBULATORY_CARE_PROVIDER_SITE_OTHER): Payer: Medicaid Other | Admitting: Vascular Surgery

## 2019-08-26 ENCOUNTER — Other Ambulatory Visit: Payer: Self-pay

## 2019-08-26 VITALS — BP 187/99 | HR 84 | Temp 97.5°F | Resp 20 | Ht 72.0 in | Wt 235.0 lb

## 2019-08-26 DIAGNOSIS — E1369 Other specified diabetes mellitus with other specified complication: Secondary | ICD-10-CM

## 2019-08-26 DIAGNOSIS — G4762 Sleep related leg cramps: Secondary | ICD-10-CM

## 2019-08-26 NOTE — Progress Notes (Signed)
[facility]  Patient name: Charles [name] MRN: [id number] DOB: 1955-11-26 Sex: male  REASON FOR CONSULT: Here today for evaluation of lower extremity symptoms to rule out arterial insufficiency.  His main complaint is of nocturnal leg cramps.  He reports that this can awaken him at night.  He reports that he does have some improvement in a tablespoon of vinegar or mustard.  He does have a history of diabetes but currently is not taking insulin.  He does report a remote history of significant trauma to his right leg and had pinning of his femoral fracture.  Does appear that he does have chronic venous insufficiency related to this.  He did report attempting use of compression at some point in his life and did not have success with this.  HPI: Charles [name] is a 63 y.o. male, who is he does not have any claudication type symptoms.  No arterial rest pain or tissue loss  Past Medical History:  Diagnosis Date  . Anemia   . Anxiety   . Chronic back pain   . Dementia    Nursing facility feels he has Dementia  . Diabetes mellitus   . Fracture of right foot   . Hypertension   . Mental disorder    schizophrenia    Family History  Problem Relation Age of Onset  . Colon cancer Neg Hx   . Liver disease Neg Hx        unknown for sure, mom died at age 33    SOCIAL HISTORY: Social History   Socioeconomic History  . Marital status: Single    Spouse name: Not on file  . Number of children: Not on file  . Years of education: Not on file  . Highest education level: Not on file  Occupational History  . Not on file  Social Needs  . Financial resource strain: Not on file  . Food insecurity    Worry: Not on file    Inability: Not on file  . Transportation needs    Medical: Not on file    Non-medical: Not on file  Tobacco Use  . Smoking status: Current Every Day Smoker    Packs/day: 0.50    Years: 40.00    Pack years:  20.00    Types: Cigarettes  . Smokeless tobacco: Never Used  Substance and Sexual Activity  . Alcohol use: Yes    Comment: Drinks beer currnently 1-2 forty ounce beers a day;   . Drug use: No  . Sexual activity: Not on file  Lifestyle  . Physical activity    Days per week: Not on file    Minutes per session: Not on file  . Stress: Not on file  Relationships  . Social Herbalist on phone: Not on file    Gets together: Not on file    Attends religious service: Not on file    Active member of club or organization: Not on file    Attends meetings of clubs or organizations: Not on file    Relationship status: Not on file  . Intimate partner violence    Fear of current or ex partner: Not on file    Emotionally abused: Not on file    Physically abused: Not on file    Forced sexual activity: Not on file  Other Topics Concern  . Not on file  Social History Narrative  . Not on file  No Known Allergies  Current Outpatient Medications  Medication Sig Dispense Refill  . albuterol (VENTOLIN HFA) 108 (90 Base) MCG/ACT inhaler Inhale 2 puffs into the lungs 4 (four) times daily.    Marland Kitchen aspirin EC 81 MG tablet Take 1 tablet (81 mg total) by mouth daily with breakfast.    . cyclobenzaprine (FLEXERIL) 10 MG tablet Take 10 mg by mouth daily.    . ferrous sulfate 325 (65 FE) MG tablet Take 325 mg by mouth daily with breakfast.    . furosemide (LASIX) 40 MG tablet Take 40 mg by mouth daily with breakfast.    . gabapentin (NEURONTIN) 300 MG capsule Take 600 mg by mouth 3 (three) times daily.    Marland Kitchen gabapentin (NEURONTIN) 600 MG tablet Take 600 mg by mouth 3 (three) times daily.     . insulin detemir (LEVEMIR) 100 UNIT/ML injection Inject 50 Units into the skin daily.    . insulin lispro (HUMALOG) 100 UNIT/ML injection Inject 5 Units into the skin 3 (three) times daily before meals.    Marland Kitchen lisinopril (PRINIVIL,ZESTRIL) 10 MG tablet Take 10 mg by mouth daily.    . potassium chloride  (K-DUR,KLOR-CON) 10 MEQ tablet Take 20 mEq by mouth daily.    . pravastatin (PRAVACHOL) 40 MG tablet Take 40 mg by mouth daily.     . tamsulosin (FLOMAX) 0.4 MG CAPS capsule Take 0.4 mg by mouth.    . benzonatate (TESSALON) 100 MG capsule Take 100 mg by mouth 3 (three) times daily.     No current facility-administered medications for this visit.     REVIEW OF SYSTEMS:  [X]  denotes positive finding, [ ]  denotes negative finding Cardiac  Comments:  Chest pain or chest pressure:    Shortness of breath upon exertion:    Short of breath when lying flat:    Irregular heart rhythm:        Vascular    Pain in calf, thigh, or hip brought on by ambulation:    Pain in feet at night that wakes you up from your sleep:     Blood clot in your veins:    Leg swelling:  x       Pulmonary    Oxygen at home:    Productive cough:     Wheezing:         Neurologic    Sudden weakness in arms or legs:     Sudden numbness in arms or legs:     Sudden onset of difficulty speaking or slurred speech:    Temporary loss of vision in one eye:     Problems with dizziness:         Gastrointestinal    Blood in stool:     Vomited blood:         Genitourinary    Burning when urinating:     Blood in urine:        Psychiatric    Major depression:         Hematologic    Bleeding problems:    Problems with blood clotting too easily:        Skin    Rashes or ulcers:        Constitutional    Fever or chills:      PHYSICAL EXAM: Vitals:   08/26/19 1051  BP: (!) 187/99  Pulse: 84  Resp: 20  Temp: (!) 97.5 F (36.4 C)  SpO2: 97%  Weight: 235 lb (106.6 kg)  Height: 6' (  1.829 m)    GENERAL: The patient is a well-nourished male, in no acute distress. The vital signs are documented above. CARDIOVASCULAR: Carotid arteries without bruits bilaterally.  2+ radial 2+ femoral 2-3+ popliteal and 2+ posterior tibial pulses bilaterally.  Varicosities noted throughout his right thigh calf and foot  PULMONARY: There is good air exchange  ABDOMEN: Soft and non-tender  MUSCULOSKELETAL: There are no major deformities or cyanosis. NEUROLOGIC: No focal weakness or paresthesias are detected. SKIN: There are no ulcers or rashes noted. PSYCHIATRIC: The patient has a normal affect.  DATA:  Noninvasive arterial studies today reveal ankle arm index near normal at 0.89 on the right and 0.87 on the left with biphasic waveforms  MEDICAL ISSUES: Discussed this with the patient.  I do not see any evidence of arterial insufficiency and certainly do not feel he has any symptoms related to this.  He has near normal arterial flow.  He does have significant venous hypertension in his right leg related to old trauma.  I explained that if this progresses he would benefit from compression garments but he has had a very difficult time complying with this in the past.  He will see [place] again on an as-needed basis   Rosetta Posner, MD FACS [facility] Tel [phone] Pager [phone]

## 2019-08-28 NOTE — Progress Notes (Signed)
Subjective: Charles [name] presents with diabetes, diabetic neuropathy and cc of painful, discolored, thick toenails and painful calluses which interfere with activities of daily living. Pain is aggravated when wearing enclosed shoe gear. Pain is relieved with periodic professional debridement.  Current Outpatient Medications on File Prior to Visit  Medication Sig Dispense Refill  . albuterol (VENTOLIN HFA) 108 (90 Base) MCG/ACT inhaler Inhale 2 puffs into the lungs 4 (four) times daily.    Marland Kitchen aspirin EC 81 MG tablet Take 1 tablet (81 mg total) by mouth daily with breakfast.    . benzonatate (TESSALON) 100 MG capsule Take 100 mg by mouth 3 (three) times daily.    . cyclobenzaprine (FLEXERIL) 10 MG tablet Take 10 mg by mouth daily.    . ferrous sulfate 325 (65 FE) MG tablet Take 325 mg by mouth daily with breakfast.    . furosemide (LASIX) 40 MG tablet Take 40 mg by mouth daily with breakfast.    . gabapentin (NEURONTIN) 300 MG capsule Take 600 mg by mouth 3 (three) times daily.    Marland Kitchen gabapentin (NEURONTIN) 600 MG tablet Take 600 mg by mouth 3 (three) times daily.     . insulin detemir (LEVEMIR) 100 UNIT/ML injection Inject 50 Units into the skin daily.    . insulin lispro (HUMALOG) 100 UNIT/ML injection Inject 5 Units into the skin 3 (three) times daily before meals.    Marland Kitchen lisinopril (PRINIVIL,ZESTRIL) 10 MG tablet Take 10 mg by mouth daily.    . potassium chloride (K-DUR,KLOR-CON) 10 MEQ tablet Take 20 mEq by mouth daily.    . pravastatin (PRAVACHOL) 40 MG tablet Take 40 mg by mouth daily.     . tamsulosin (FLOMAX) 0.4 MG CAPS capsule Take 0.4 mg by mouth.     No current facility-administered medications on file prior to visit.     No Known Allergies  Objective: Vascular Examination: Capillary refill time <5 seconds b/l.  Dorsalis pedis pulses 1/4 b/l.  Posterior tibial pulses 1/4 b/l.  Digital hair absent b/l.  Skin temperature gradient WNL b/l.  Dermatological Examination: Skin  with normal turgor, texture and tone b/l.  Toenails 1-5 b/l discolored, thick, dystrophic with subungual debris and pain with palpation to nailbeds due to thickness of nails.  Hyperkeratotic lesions submet 1 left foot, submet head 5 left . No erythema, no edema, no drainage, no flocculence noted.   Musculoskeletal: Muscle strength 5/5 to all LE muscle groups.  HAV with bunion left foot.  No pain, crepitus or joint limitation with passive/active ROM.  Neurological: Sensation diminished with 10 gram monofilament bilaterally.  Vibratory sensation diminished bilaterally.  Assessment: 1. Painful onychomycosis toenails 1-5 b/l 2. Calluses submet head 1 left, submet head 5 left foot 3. NIDDM with Diabetic neuropathy  Plan: 1. Continue diabetic foot care principles. Literature dispensed on today. 2. Toenails 1-5 b/l were debrided in length and girth without iatrogenic bleeding. 3. As a courtesy, calluses were pared submet head 1 left, submet head 5 left foot 4. Patient to continue soft, supportive shoe gear. 5. Patient to report any pedal injuries to medical professional. 6. Follow up 3 months.  7. Patient/POA to call should there be a concern in the interim.

## 2019-11-26 ENCOUNTER — Ambulatory Visit: Payer: Medicaid Other | Admitting: Podiatry

## 2020-01-26 ENCOUNTER — Other Ambulatory Visit: Payer: Self-pay

## 2020-01-26 ENCOUNTER — Encounter: Payer: Self-pay | Admitting: Podiatry

## 2020-01-26 ENCOUNTER — Ambulatory Visit: Payer: Medicaid Other | Admitting: Podiatry

## 2020-01-26 VITALS — Temp 96.2°F

## 2020-01-26 DIAGNOSIS — M2012 Hallux valgus (acquired), left foot: Secondary | ICD-10-CM

## 2020-01-26 DIAGNOSIS — M79675 Pain in left toe(s): Secondary | ICD-10-CM | POA: Diagnosis not present

## 2020-01-26 DIAGNOSIS — L84 Corns and callosities: Secondary | ICD-10-CM

## 2020-01-26 DIAGNOSIS — M79674 Pain in right toe(s): Secondary | ICD-10-CM

## 2020-01-26 DIAGNOSIS — M2042 Other hammer toe(s) (acquired), left foot: Secondary | ICD-10-CM

## 2020-01-26 DIAGNOSIS — B351 Tinea unguium: Secondary | ICD-10-CM

## 2020-01-26 DIAGNOSIS — M2011 Hallux valgus (acquired), right foot: Secondary | ICD-10-CM

## 2020-01-26 DIAGNOSIS — E1142 Type 2 diabetes mellitus with diabetic polyneuropathy: Secondary | ICD-10-CM | POA: Diagnosis not present

## 2020-01-26 DIAGNOSIS — M2041 Other hammer toe(s) (acquired), right foot: Secondary | ICD-10-CM | POA: Diagnosis not present

## 2020-01-26 NOTE — Patient Instructions (Signed)
Diabetes Mellitus and Foot Care Foot care is an important part of your health, especially when you have diabetes. Diabetes may cause you to have problems because of poor blood flow (circulation) to your feet and legs, which can cause your skin to:  Become thinner and drier.  Break more easily.  Heal more slowly.  Peel and crack. You may also have nerve damage (neuropathy) in your legs and feet, causing decreased feeling in them. This means that you may not notice minor injuries to your feet that could lead to more serious problems. Noticing and addressing any potential problems early is the best way to prevent future foot problems. How to care for your feet Foot hygiene  Wash your feet daily with warm water and mild soap. Do not use hot water. Then, pat your feet and the areas between your toes until they are completely dry. Do not soak your feet as this can dry your skin.  Trim your toenails straight across. Do not dig under them or around the cuticle. File the edges of your nails with an emery board or nail file.  Apply a moisturizing lotion or petroleum jelly to the skin on your feet and to dry, brittle toenails. Use lotion that does not contain alcohol and is unscented. Do not apply lotion between your toes. Shoes and socks  Wear clean socks or stockings every day. Make sure they are not too tight. Do not wear knee-high stockings since they may decrease blood flow to your legs.  Wear shoes that fit properly and have enough cushioning. Always look in your shoes before you put them on to be sure there are no objects inside.  To break in new shoes, wear them for just a few hours a day. This prevents injuries on your feet. Wounds, scrapes, corns, and calluses  Check your feet daily for blisters, cuts, bruises, sores, and redness. If you cannot see the bottom of your feet, use a mirror or ask someone for help.  Do not cut corns or calluses or try to remove them with medicine.  If you  find a minor scrape, cut, or break in the skin on your feet, keep it and the skin around it clean and dry. You may clean these areas with mild soap and water. Do not clean the area with peroxide, alcohol, or iodine.  If you have a wound, scrape, corn, or callus on your foot, look at it several times a day to make sure it is healing and not infected. Check for: ? Redness, swelling, or pain. ? Fluid or blood. ? Warmth. ? Pus or a bad smell. General instructions  Do not cross your legs. This may decrease blood flow to your feet.  Do not use heating pads or hot water bottles on your feet. They may burn your skin. If you have lost feeling in your feet or legs, you may not know this is happening until it is too late.  Protect your feet from hot and cold by wearing shoes, such as at the beach or on hot pavement.  Schedule a complete foot exam at least once a year (annually) or more often if you have foot problems. If you have foot problems, report any cuts, sores, or bruises to your health care provider immediately. Contact a health care provider if:  You have a medical condition that increases your risk of infection and you have any cuts, sores, or bruises on your feet.  You have an injury that is not   healing.  You have redness on your legs or feet.  You feel burning or tingling in your legs or feet.  You have pain or cramps in your legs and feet.  Your legs or feet are numb.  Your feet always feel cold.  You have pain around a toenail. Get help right away if:  You have a wound, scrape, corn, or callus on your foot and: ? You have pain, swelling, or redness that gets worse. ? You have fluid or blood coming from the wound, scrape, corn, or callus. ? Your wound, scrape, corn, or callus feels warm to the touch. ? You have pus or a bad smell coming from the wound, scrape, corn, or callus. ? You have a fever. ? You have a red line going up your leg. Summary  Check your feet every day  for cuts, sores, red spots, swelling, and blisters.  Moisturize feet and legs daily.  Wear shoes that fit properly and have enough cushioning.  If you have foot problems, report any cuts, sores, or bruises to your health care provider immediately.  Schedule a complete foot exam at least once a year (annually) or more often if you have foot problems. This information is not intended to replace advice given to you by your health care provider. Make sure you discuss any questions you have with your health care provider. Document Revised: 07/09/2019 Document Reviewed: 11/17/2016 Elsevier Patient Education  2020 [employer]  Peripheral Neuropathy Peripheral neuropathy is a type of nerve damage. It affects nerves that carry signals between the spinal cord and the arms, legs, and the rest of the body (peripheral nerves). It does not affect nerves in the spinal cord or brain. In peripheral neuropathy, one nerve or a group of nerves may be damaged. Peripheral neuropathy is a broad category that includes many specific nerve disorders, like diabetic neuropathy, hereditary neuropathy, and carpal tunnel syndrome. What are the causes? This condition may be caused by:  Diabetes. This is the most common cause of peripheral neuropathy.  Nerve injury.  Pressure or stress on a nerve that lasts a long time.  Lack (deficiency) of B vitamins. This can result from alcoholism, poor diet, or a restricted diet.  Infections.  Autoimmune diseases, such as rheumatoid arthritis and systemic lupus erythematosus.  Nerve diseases that are passed from parent to child (inherited).  Some medicines, such as cancer medicines (chemotherapy).  Poisonous (toxic) substances, such as lead and mercury.  Too little blood flowing to the legs.  Kidney disease.  Thyroid disease. In some cases, the cause of this condition is not known. What are the signs or symptoms? Symptoms of this condition depend on which of your  nerves is damaged. Common symptoms include:  Loss of feeling (numbness) in the feet, hands, or both.  Tingling in the feet, hands, or both.  Burning pain.  Very sensitive skin.  Weakness.  Not being able to move a part of the body (paralysis).  Muscle twitching.  Clumsiness or poor coordination.  Loss of balance.  Not being able to control your bladder.  Feeling dizzy.  Sexual problems. How is this diagnosed? Diagnosing and finding the cause of peripheral neuropathy can be difficult. Your health care provider will take your medical history and do a physical exam. A neurological exam will also be done. This involves checking things that are affected by your brain, spinal cord, and nerves (nervous system). For example, your health care provider will check your reflexes, how you move, and   what you can feel. You may have other tests, such as:  Blood tests.  Electromyogram (EMG) and nerve conduction tests. These tests check nerve function and how well the nerves are controlling the muscles.  Imaging tests, such as CT scans or MRI to rule out other causes of your symptoms.  Removing a small piece of nerve to be examined in a lab (nerve biopsy). This is rare.  Removing and examining a small amount of the fluid that surrounds the brain and spinal cord (lumbar puncture). This is rare. How is this treated? Treatment for this condition may involve:  Treating the underlying cause of the neuropathy, such as diabetes, kidney disease, or vitamin deficiencies.  Stopping medicines that can cause neuropathy, such as chemotherapy.  Medicine to relieve pain. Medicines may include: ? Prescription or over-the-counter pain medicine. ? Antiseizure medicine. ? Antidepressants. ? Pain-relieving patches that are applied to painful areas of skin.  Surgery to relieve pressure on a nerve or to destroy a nerve that is causing pain.  Physical therapy to help improve movement and  balance.  Devices to help you move around (assistive devices). Follow these instructions at home: Medicines  Take over-the-counter and prescription medicines only as told by your health care provider. Do not take any other medicines without first asking your health care provider.  Do not drive or use heavy machinery while taking prescription pain medicine. Lifestyle   Do not use any products that contain nicotine or tobacco, such as cigarettes and e-cigarettes. Smoking keeps blood from reaching damaged nerves. If you need help quitting, ask your health care provider.  Avoid or limit alcohol. Too much alcohol can cause a vitamin B deficiency, and vitamin B is needed for healthy nerves.  Eat a healthy diet. This includes: ? Eating foods that are high in fiber, such as fresh fruits and vegetables, whole grains, and beans. ? Limiting foods that are high in fat and processed sugars, such as fried or sweet foods. General instructions   If you have diabetes, work closely with your health care provider to keep your blood sugar under control.  If you have numbness in your feet: ? Check every day for signs of injury or infection. Watch for redness, warmth, and swelling. ? Wear padded socks and comfortable shoes. These help protect your feet.  Develop a good support system. Living with peripheral neuropathy can be stressful. Consider talking with a mental health specialist or joining a support group.  Use assistive devices and attend physical therapy as told by your health care provider. This may include using a [place] or a cane.  Keep all follow-up visits as told by your health care provider. This is important. Contact a health care provider if:  You have new signs or symptoms of peripheral neuropathy.  You are struggling emotionally from dealing with peripheral neuropathy.  Your pain is not well-controlled. Get help right away if:  You have an injury or infection that is not healing  normally.  You develop new weakness in an arm or leg.  You fall frequently. Summary  Peripheral neuropathy is when the nerves in the arms, or legs are damaged, resulting in numbness, weakness, or pain.  There are many causes of peripheral neuropathy, including diabetes, pinched nerves, vitamin deficiencies, autoimmune disease, and hereditary conditions.  Diagnosing and finding the cause of peripheral neuropathy can be difficult. Your health care provider will take your medical history, do a physical exam, and do tests, including blood tests and nerve function tests.    Treatment involves treating the underlying cause of the neuropathy and taking medicines to help control pain. Physical therapy and assistive devices may also help. This information is not intended to replace advice given to you by your health care provider. Make sure you discuss any questions you have with your health care provider. Document Revised: 09/28/2017 Document Reviewed: 12/25/2016 Elsevier Patient Education  2020 Elsevier Inc.  

## 2020-01-27 NOTE — Progress Notes (Signed)
Subjective: Charles [name] presents today for follow up of preventative diabetic foot care, painful mycotic nails b/l that are difficult to trim. Pain interferes with ambulation. Aggravating factors include wearing enclosed shoe gear. Pain is relieved with periodic professional debridement and corn(s) left 2nd toe and callus(es) b/l feet and painful mycotic nails b/l.  Pain interferes with ambulation. Aggravating factors include wearing enclosed shoe gear.   Today, he is inquiring about obtaining diabetic shoes. He states he usually gets his shoes from [employer] in [place].  He voices no other concerns on today's visit.  No Known Allergies   Objective: Vitals:   01/26/20 0908  Temp: (!) 96.2 F (35.7 C)    Pt is a 64 y.o. [name], obese in NAD. AAO x 3.  Vascular Examination:  Capillary refill time to digits immediate b/l. Faintly palpable pedal pulses b/l. Pedal hair absent b/l Skin temperature gradient within normal limits b/l.  Dermatological Examination: Pedal skin with normal turgor, texture and tone bilaterally. No open wounds bilaterally. No interdigital macerations bilaterally. Toenails 1-5 b/l elongated, dystrophic, thickened, crumbly with subungual debris and tenderness to dorsal palpation. Hyperkeratotic lesion(s) L 2nd toe, submet head 1 left foot, submet head 1 right foot, submet head 5 left foot and submet head 5 right foot.  No erythema, no edema, no drainage, no flocculence.  Musculoskeletal: Normal muscle strength 5/5 to all lower extremity muscle groups bilaterally, no pain crepitus or joint limitation noted with ROM b/l, bunion deformity noted b/l and hammertoes noted to the  L 2nd toe.  Neurological: Protective sensation diminished with 10g monofilament b/l. Vibratory sensation absent b/l.  Assessment: 1. Pain due to onychomycosis of toenails of both feet   2. Corns and callosities   3. Hallux valgus, acquired, bilateral   4. Acquired hammertoes of both  feet   5. Diabetic peripheral neuropathy associated with type 2 diabetes mellitus (Charles [name])    Plan: -Continue diabetic foot care principles. Literature dispensed on today.  -Toenails 1-5 b/l were debrided in length and girth with sterile nail nippers and dremel without iatrogenic bleeding.  -Corn(s) L 2nd toe and callus(es) submet head 1 left foot, submet head 1 right foot, submet head 5 left foot and submet head 5 right foot were debrided without complication or incident. Total number debrided 5. -Rx: written on prescription pad to [facility] to dispense one pair of extra depth shoes with custom molded insoles. Offload calluses for submet heads 1, 5 calluses b/l. -Patient to report any pedal injuries to medical professional immediately. -Patient/POA to call should there be question/concern in the interim.  Return in about 3 months (around 04/27/2020) for diabetic nail and callus trim.

## 2020-04-26 ENCOUNTER — Encounter: Payer: Self-pay | Admitting: Podiatry

## 2020-04-26 ENCOUNTER — Other Ambulatory Visit: Payer: Self-pay

## 2020-04-26 ENCOUNTER — Ambulatory Visit: Payer: Medicaid Other | Admitting: Podiatry

## 2020-04-26 DIAGNOSIS — M79675 Pain in left toe(s): Secondary | ICD-10-CM

## 2020-04-26 DIAGNOSIS — E1142 Type 2 diabetes mellitus with diabetic polyneuropathy: Secondary | ICD-10-CM | POA: Diagnosis not present

## 2020-04-26 DIAGNOSIS — M2041 Other hammer toe(s) (acquired), right foot: Secondary | ICD-10-CM

## 2020-04-26 DIAGNOSIS — M2011 Hallux valgus (acquired), right foot: Secondary | ICD-10-CM

## 2020-04-26 DIAGNOSIS — M79674 Pain in right toe(s): Secondary | ICD-10-CM

## 2020-04-26 DIAGNOSIS — M2042 Other hammer toe(s) (acquired), left foot: Secondary | ICD-10-CM

## 2020-04-26 DIAGNOSIS — L84 Corns and callosities: Secondary | ICD-10-CM

## 2020-04-26 DIAGNOSIS — B351 Tinea unguium: Secondary | ICD-10-CM | POA: Diagnosis not present

## 2020-04-26 NOTE — Patient Instructions (Signed)
Diabetes Mellitus and Foot Care Foot care is an important part of your health, especially when you have diabetes. Diabetes may cause you to have problems because of poor blood flow (circulation) to your feet and legs, which can cause your skin to:  Become thinner and drier.  Break more easily.  Heal more slowly.  Peel and crack. You may also have nerve damage (neuropathy) in your legs and feet, causing decreased feeling in them. This means that you may not notice minor injuries to your feet that could lead to more serious problems. Noticing and addressing any potential problems early is the best way to prevent future foot problems. How to care for your feet Foot hygiene  Wash your feet daily with warm water and mild soap. Do not use hot water. Then, pat your feet and the areas between your toes until they are completely dry. Do not soak your feet as this can dry your skin.  Trim your toenails straight across. Do not dig under them or around the cuticle. File the edges of your nails with an emery board or nail file.  Apply a moisturizing lotion or petroleum jelly to the skin on your feet and to dry, brittle toenails. Use lotion that does not contain alcohol and is unscented. Do not apply lotion between your toes. Shoes and socks  Wear clean socks or stockings every day. Make sure they are not too tight. Do not wear knee-high stockings since they may decrease blood flow to your legs.  Wear shoes that fit properly and have enough cushioning. Always look in your shoes before you put them on to be sure there are no objects inside.  To break in new shoes, wear them for just a few hours a day. This prevents injuries on your feet. Wounds, scrapes, corns, and calluses  Check your feet daily for blisters, cuts, bruises, sores, and redness. If you cannot see the bottom of your feet, use a mirror or ask someone for help.  Do not cut corns or calluses or try to remove them with medicine.  If you  find a minor scrape, cut, or break in the skin on your feet, keep it and the skin around it clean and dry. You may clean these areas with mild soap and water. Do not clean the area with peroxide, alcohol, or iodine.  If you have a wound, scrape, corn, or callus on your foot, look at it several times a day to make sure it is healing and not infected. Check for: ? Redness, swelling, or pain. ? Fluid or blood. ? Warmth. ? Pus or a bad smell. General instructions  Do not cross your legs. This may decrease blood flow to your feet.  Do not use heating pads or hot water bottles on your feet. They may burn your skin. If you have lost feeling in your feet or legs, you may not know this is happening until it is too late.  Protect your feet from hot and cold by wearing shoes, such as at the beach or on hot pavement.  Schedule a complete foot exam at least once a year (annually) or more often if you have foot problems. If you have foot problems, report any cuts, sores, or bruises to your health care provider immediately. Contact a health care provider if:  You have a medical condition that increases your risk of infection and you have any cuts, sores, or bruises on your feet.  You have an injury that is not   healing.  You have redness on your legs or feet.  You feel burning or tingling in your legs or feet.  You have pain or cramps in your legs and feet.  Your legs or feet are numb.  Your feet always feel cold.  You have pain around a toenail. Get help right away if:  You have a wound, scrape, corn, or callus on your foot and: ? You have pain, swelling, or redness that gets worse. ? You have fluid or blood coming from the wound, scrape, corn, or callus. ? Your wound, scrape, corn, or callus feels warm to the touch. ? You have pus or a bad smell coming from the wound, scrape, corn, or callus. ? You have a fever. ? You have a red line going up your leg. Summary  Check your feet every day  for cuts, sores, red spots, swelling, and blisters.  Moisturize feet and legs daily.  Wear shoes that fit properly and have enough cushioning.  If you have foot problems, report any cuts, sores, or bruises to your health care provider immediately.  Schedule a complete foot exam at least once a year (annually) or more often if you have foot problems. This information is not intended to replace advice given to you by your health care provider. Make sure you discuss any questions you have with your health care provider. Document Revised: 07/09/2019 Document Reviewed: 11/17/2016 Elsevier Patient Education  2020 Elsevier Inc.  

## 2020-04-27 ENCOUNTER — Telehealth: Payer: Self-pay | Admitting: Podiatry

## 2020-04-27 NOTE — Telephone Encounter (Signed)
Received call from Juliette Mangle @ [facility] stating that pt brought in a prescription for diabetic shoes and custom molded inserts with offloading. They only do heat molded inserts so if pt needs custom molded he will have to go somewhere else. But if he can get the heat molded they would need a new prescription sent to them.  FYI: due to pts insurance medicaid  diabetic shoes/inserts are not covered thru our office.

## 2020-04-30 NOTE — Progress Notes (Signed)
Subjective: Charles [name] presents today for follow up of preventative diabetic foot care, painful mycotic nails b/l that are difficult to trim. Pain interferes with ambulation. Aggravating factors include wearing enclosed shoe gear. Pain is relieved with periodic professional debridement and corn(s) left 2nd toe and callus(es) b/l feet and painful mycotic nails b/l.  Pain interferes with ambulation. Aggravating factors include wearing enclosed shoe gear.   Today, he states he lost his prescription for diabetic shoes I gave him on the last visit. He voices no other concerns on today's visit.  No Known Allergies   Objective: There were no vitals filed for this visit.  Pt is a 64 y.o. [name], obese in NAD. AAO x 3.  Vascular Examination:  Capillary refill time to digits immediate b/l. Faintly palpable pedal pulses b/l. Pedal hair absent. Lower extremity skin temperature gradient within normal limits. No pain with calf compression b/l.  Dermatological Examination: Pedal skin with normal turgor, texture and tone bilaterally. No open wounds bilaterally. No interdigital macerations bilaterally. Toenails 1-5 b/l elongated, discolored, dystrophic, thickened, crumbly with subungual debris and tenderness to dorsal palpation. Hyperkeratotic lesion(s) submet head 1 left foot. No erythema, no edema, no drainage, no flocculence.  Musculoskeletal: Normal muscle strength 5/5 to all lower extremity muscle groups bilaterally, no pain crepitus or joint limitation noted with ROM b/l, bunion deformity noted b/l and hammertoes noted to the  L 2nd toe.  Neurological: Protective sensation diminished with 10g monofilament b/l. Vibratory sensation absent b/l.  Assessment: 1. Pain due to onychomycosis of toenails of both feet   2. Callus   3. Acquired hammertoes of both feet   4. Hallux valgus, acquired, bilateral   5. Diabetic peripheral neuropathy associated with type 2 diabetes mellitus (Charles [name])     Plan: -Continue diabetic foot care principles. Literature dispensed on today.  -Toenails 1-5 b/l were debrided in length and girth with sterile nail nippers and dremel without iatrogenic bleeding.  -As courtesy, callus pared left foot. -Rx: typed in chart as DME order to [facility] to dispense one pair of extra depth shoes with custom molded insoles. DME order will be faxed to [facility]. Offload callus for submet head 1 left foot. -Patient to report any pedal injuries to medical professional immediately. -Patient/POA to call should there be question/concern in the interim.  Return in about 3 months (around 07/27/2020) for diabetic nail trim.

## 2020-05-04 ENCOUNTER — Other Ambulatory Visit (INDEPENDENT_AMBULATORY_CARE_PROVIDER_SITE_OTHER): Payer: Medicaid Other | Admitting: Podiatry

## 2020-05-04 ENCOUNTER — Telehealth: Payer: Self-pay | Admitting: Podiatry

## 2020-05-04 DIAGNOSIS — M2041 Other hammer toe(s) (acquired), right foot: Secondary | ICD-10-CM

## 2020-05-04 DIAGNOSIS — M2042 Other hammer toe(s) (acquired), left foot: Secondary | ICD-10-CM

## 2020-05-04 DIAGNOSIS — E1142 Type 2 diabetes mellitus with diabetic polyneuropathy: Secondary | ICD-10-CM

## 2020-05-04 DIAGNOSIS — M2012 Hallux valgus (acquired), left foot: Secondary | ICD-10-CM

## 2020-05-04 DIAGNOSIS — L84 Corns and callosities: Secondary | ICD-10-CM

## 2020-05-04 DIAGNOSIS — M2011 Hallux valgus (acquired), right foot: Secondary | ICD-10-CM

## 2020-05-04 NOTE — Telephone Encounter (Signed)
Received a voicemail from [place] (Juliette Mangle) wanting a call back about pt.

## 2020-05-04 NOTE — Progress Notes (Signed)
Resent prescription for diabetic shoes to [facility]. Prescription was updated and faxed.

## 2020-07-27 ENCOUNTER — Encounter (HOSPITAL_COMMUNITY): Payer: Self-pay | Admitting: Emergency Medicine

## 2020-07-27 ENCOUNTER — Emergency Department (HOSPITAL_COMMUNITY)
Admission: EM | Admit: 2020-07-27 | Discharge: 2020-07-28 | Disposition: A | Discharge status: Home / Self Care | Payer: Medicaid Other | Attending: Emergency Medicine | Admitting: Emergency Medicine

## 2020-07-27 ENCOUNTER — Other Ambulatory Visit: Payer: Self-pay

## 2020-07-27 DIAGNOSIS — Z7951 Long term (current) use of inhaled steroids: Secondary | ICD-10-CM | POA: Diagnosis not present

## 2020-07-27 DIAGNOSIS — R519 Headache, unspecified: Secondary | ICD-10-CM | POA: Diagnosis not present

## 2020-07-27 DIAGNOSIS — M79601 Pain in right arm: Secondary | ICD-10-CM | POA: Diagnosis not present

## 2020-07-27 DIAGNOSIS — R200 Anesthesia of skin: Secondary | ICD-10-CM | POA: Diagnosis not present

## 2020-07-27 DIAGNOSIS — M79604 Pain in right leg: Secondary | ICD-10-CM | POA: Diagnosis not present

## 2020-07-27 DIAGNOSIS — Z79899 Other long term (current) drug therapy: Secondary | ICD-10-CM | POA: Insufficient documentation

## 2020-07-27 DIAGNOSIS — M25511 Pain in right shoulder: Secondary | ICD-10-CM | POA: Insufficient documentation

## 2020-07-27 DIAGNOSIS — E119 Type 2 diabetes mellitus without complications: Secondary | ICD-10-CM | POA: Insufficient documentation

## 2020-07-27 DIAGNOSIS — F17210 Nicotine dependence, cigarettes, uncomplicated: Secondary | ICD-10-CM | POA: Insufficient documentation

## 2020-07-27 DIAGNOSIS — F0390 Unspecified dementia without behavioral disturbance: Secondary | ICD-10-CM | POA: Diagnosis not present

## 2020-07-27 DIAGNOSIS — Z794 Long term (current) use of insulin: Secondary | ICD-10-CM | POA: Insufficient documentation

## 2020-07-27 DIAGNOSIS — Z7982 Long term (current) use of aspirin: Secondary | ICD-10-CM | POA: Diagnosis not present

## 2020-07-27 DIAGNOSIS — I10 Essential (primary) hypertension: Secondary | ICD-10-CM | POA: Insufficient documentation

## 2020-07-27 DIAGNOSIS — M5441 Lumbago with sciatica, right side: Secondary | ICD-10-CM | POA: Diagnosis not present

## 2020-07-27 NOTE — ED Triage Notes (Signed)
Pt c/o headache x 3 weeks with cramps and back pain. Per ems pt bs was 120 and pt states he has drank 3 beers tonight.

## 2020-07-28 ENCOUNTER — Emergency Department (HOSPITAL_COMMUNITY): Payer: Medicaid Other

## 2020-07-28 ENCOUNTER — Encounter (HOSPITAL_COMMUNITY): Payer: Self-pay | Admitting: Emergency Medicine

## 2020-07-28 ENCOUNTER — Other Ambulatory Visit: Payer: Self-pay

## 2020-07-28 ENCOUNTER — Encounter (HOSPITAL_COMMUNITY): Payer: Self-pay

## 2020-07-28 ENCOUNTER — Emergency Department (HOSPITAL_COMMUNITY)
Admission: EM | Admit: 2020-07-28 | Discharge: 2020-07-28 | Disposition: A | Discharge status: Home / Self Care | Payer: Medicaid Other | Source: Home / Self Care | Attending: Emergency Medicine | Admitting: Emergency Medicine

## 2020-07-28 DIAGNOSIS — M25511 Pain in right shoulder: Secondary | ICD-10-CM

## 2020-07-28 DIAGNOSIS — I10 Essential (primary) hypertension: Secondary | ICD-10-CM | POA: Insufficient documentation

## 2020-07-28 DIAGNOSIS — R200 Anesthesia of skin: Secondary | ICD-10-CM | POA: Insufficient documentation

## 2020-07-28 DIAGNOSIS — W19XXXA Unspecified fall, initial encounter: Secondary | ICD-10-CM

## 2020-07-28 DIAGNOSIS — Z794 Long term (current) use of insulin: Secondary | ICD-10-CM | POA: Insufficient documentation

## 2020-07-28 DIAGNOSIS — F17210 Nicotine dependence, cigarettes, uncomplicated: Secondary | ICD-10-CM | POA: Insufficient documentation

## 2020-07-28 DIAGNOSIS — M79604 Pain in right leg: Secondary | ICD-10-CM | POA: Insufficient documentation

## 2020-07-28 DIAGNOSIS — M5441 Lumbago with sciatica, right side: Secondary | ICD-10-CM

## 2020-07-28 DIAGNOSIS — Z7982 Long term (current) use of aspirin: Secondary | ICD-10-CM | POA: Insufficient documentation

## 2020-07-28 DIAGNOSIS — R519 Headache, unspecified: Secondary | ICD-10-CM | POA: Insufficient documentation

## 2020-07-28 DIAGNOSIS — Z79899 Other long term (current) drug therapy: Secondary | ICD-10-CM | POA: Insufficient documentation

## 2020-07-28 DIAGNOSIS — Z7951 Long term (current) use of inhaled steroids: Secondary | ICD-10-CM | POA: Insufficient documentation

## 2020-07-28 DIAGNOSIS — F0390 Unspecified dementia without behavioral disturbance: Secondary | ICD-10-CM | POA: Insufficient documentation

## 2020-07-28 DIAGNOSIS — E119 Type 2 diabetes mellitus without complications: Secondary | ICD-10-CM | POA: Insufficient documentation

## 2020-07-28 DIAGNOSIS — M79601 Pain in right arm: Secondary | ICD-10-CM | POA: Insufficient documentation

## 2020-07-28 MED ORDER — PREDNISONE 50 MG PO TABS
60.0000 mg | ORAL_TABLET | Freq: Once | ORAL | Status: AC
  Administered 2020-07-28: 60 mg via ORAL
  Filled 2020-07-28: qty 1

## 2020-07-28 MED ORDER — LIDOCAINE 5 % EX PTCH: 1.0000 | MEDICATED_PATCH | Freq: Once | CUTANEOUS | Status: DC

## 2020-07-28 MED ORDER — ACETAMINOPHEN 325 MG PO TABS
650.0000 mg | ORAL_TABLET | Freq: Once | ORAL | Status: AC
  Administered 2020-07-28: 650 mg via ORAL
  Filled 2020-07-28: qty 2

## 2020-07-28 MED ORDER — ACETAMINOPHEN ER 650 MG PO TBCR: 650.0000 mg | EXTENDED_RELEASE_TABLET | Freq: Three times a day (TID) | ORAL | 0 refills | Status: DC | PRN

## 2020-07-28 MED ORDER — LIDOCAINE 5 % EX PTCH
1.0000 | MEDICATED_PATCH | Freq: Once | CUTANEOUS | Status: DC
  Administered 2020-07-28: 1 via TRANSDERMAL
  Filled 2020-07-28: qty 1

## 2020-07-28 MED ORDER — PREDNISONE 10 MG (21) PO TBPK: ORAL_TABLET | Freq: Every day | ORAL | 0 refills | Status: DC

## 2020-07-28 MED ORDER — PREDNISONE 50 MG PO TABS: 60.0000 mg | ORAL_TABLET | Freq: Once | ORAL | Status: DC

## 2020-07-28 NOTE — ED Provider Notes (Signed)
[facility] EMERGENCY DEPARTMENT Provider Note   CSN: [id number] Arrival date & time: 07/28/20  1213     History Chief Complaint  Patient presents with  . Leg Pain  . Headache    Charles [name] is a 64 y.o. male with past medical history significant for anemia, anxiety, chronic back pain, diabetes, hypertension, schizophrenia. Not on anticoagulation.  HPI Patient presents to emergency department today via EMS with chief complaint of right leg pain and numbness x1 month.  Patient is not forthcoming with information.  He states he is only here because EMS was called.  He states after being discharged she went home and drank 3 beers.  He was walking in his yard stumbled and fell.  He denies hitting his head.  Apparently his neighbor saw him fall and called EMS. Patient states his symptoms of right arm and leg pain, going on for several months.  He is currently trying to get a new PCP.  He refuses to tell me if he has been evaluated for this before this in the past. No medications for symptoms prior to arrival.  Chart review patient was seen in the ED in the early hours this morning around 4:30 AM.  At that time he was presenting with a headache.  He had a head CT that was negative and he was discharged home.    Past Medical History:  Diagnosis Date  . Anemia   . Anxiety   . Chronic back pain   . Dementia    Nursing facility feels he has Dementia  . Diabetes mellitus   . Fracture of right foot   . Hypertension   . Mental disorder    schizophrenia    Patient Active Problem List   Diagnosis Date Noted  . History of colonic polyps 06/14/2018  . Encounter for screening colonoscopy 02/08/2018  . Diabetes ([place]) 07/08/2015  . High cholesterol 07/08/2015  . Hepatitis C 05/25/2015    Past Surgical History:  Procedure Laterality Date  . COLONOSCOPY N/A 04/11/2018   Procedure: COLONOSCOPY;  Surgeon: Rogene Houston, MD;  Location: [facility];  Service: Endoscopy;  Laterality:  N/A;  10:30  . COLONOSCOPY WITH PROPOFOL N/A 07/05/2018   Procedure: COLONOSCOPY WITH PROPOFOL;  Surgeon: Rogene Houston, MD;  Location: [facility];  Service: Endoscopy;  Laterality: N/A;  10:35  . left 1st toe    . ORIF TOE FRACTURE  09/05/2011   Procedure: OPEN REDUCTION INTERNAL FIXATION (ORIF) METATARSAL (TOE) FRACTURE;  Surgeon: Colin Rhein;  Location: [facility];  Service: Orthopedics;  Laterality: Left;  reconstruction left great toe FHB plantar plate  . POLYPECTOMY  04/11/2018   Procedure: POLYPECTOMY;  Surgeon: Rogene Houston, MD;  Location: [facility];  Service: Endoscopy;;  colon  . POLYPECTOMY  07/05/2018   Procedure: POLYPECTOMY;  Surgeon: Rogene Houston, MD;  Location: [facility];  Service: Endoscopy;;  colon  . right foot suger         Family History  Problem Relation Age of Onset  . Colon cancer Neg Hx   . Liver disease Neg Hx        unknown for sure, mom died at age 26    Social History   Tobacco Use  . Smoking status: Current Every Day Smoker    Packs/day: 0.50    Years: 40.00    Pack years: 20.00    Types: Cigarettes  . Smokeless tobacco: Never Used  Vaping Use  .  Vaping Use: Never used  Substance Use Topics  . Alcohol use: Yes    Comment: Drinks beer currnently 1-2 forty ounce beers a day;   . Drug use: No    Home Medications Prior to Admission medications   Medication Sig Start Date End Date Taking? Authorizing Provider  acetaminophen (TYLENOL 8 HOUR) 650 MG CR tablet Take 1 tablet (650 mg total) by mouth every 8 (eight) hours as needed for pain or fever. 07/28/20   Varney Biles, MD  albuterol (VENTOLIN HFA) 108 (90 Base) MCG/ACT inhaler Inhale 2 puffs into the lungs 4 (four) times daily. 07/18/19   [provider]  aspirin EC 81 MG tablet Take 1 tablet (81 mg total) by mouth daily with breakfast. 07/15/18   Rehman, Mechele Dawley, MD  benzonatate (TESSALON) 100 MG capsule Take 100 mg by mouth 3 (three) times daily.  07/18/19   [provider]  cyclobenzaprine (FLEXERIL) 10 MG tablet Take 10 mg by mouth daily.    [provider]  ferrous sulfate 325 (65 FE) MG tablet Take 325 mg by mouth daily with breakfast.    [provider]  furosemide (LASIX) 40 MG tablet Take 40 mg by mouth daily with breakfast.    [provider]  gabapentin (NEURONTIN) 300 MG capsule Take 600 mg by mouth 3 (three) times daily. 04/25/19   [provider]  gabapentin (NEURONTIN) 600 MG tablet Take 600 mg by mouth 3 (three) times daily.     [provider]  insulin detemir (LEVEMIR) 100 UNIT/ML injection Inject 50 Units into the skin daily.    [provider]  insulin lispro (HUMALOG) 100 UNIT/ML injection Inject 5 Units into the skin 3 (three) times daily before meals.    [provider]  lisinopril (PRINIVIL,ZESTRIL) 10 MG tablet Take 10 mg by mouth daily.    [provider]  potassium chloride (K-DUR,KLOR-CON) 10 MEQ tablet Take 20 mEq by mouth daily.    [provider]  pravastatin (PRAVACHOL) 40 MG tablet Take 40 mg by mouth daily.     [provider]  tamsulosin (FLOMAX) 0.4 MG CAPS capsule Take 0.4 mg by mouth.    [provider]    Allergies    Patient has no known allergies.  Review of Systems   Review of Systems All other systems are reviewed and are negative for acute change except as noted in the HPI.  Physical Exam Updated Vital Signs BP (!) 159/122 (BP Location: Left Arm)   Pulse (!) 115   Temp 97.7 F (36.5 C) (Oral)   Resp 19   Ht 6' (1.829 m)   Wt 106.6 kg   SpO2 96%   BMI 31.87 kg/m   Physical Exam Vitals and nursing note reviewed.  Constitutional:      General: He is not in acute distress.    Appearance: He is not ill-appearing.  HENT:     Head: Normocephalic and atraumatic.     Comments: No tenderness to palpation of skull. No deformities or crepitus noted. No open wounds, abrasions or  lacerations.    Right Ear: Tympanic membrane and external ear normal.     Left Ear: Tympanic membrane and external ear normal.     Nose: Nose normal.     Mouth/Throat:     Mouth: Mucous membranes are moist.     Pharynx: Oropharynx is clear.  Eyes:     General: No scleral icterus.       Right eye:  No discharge.        Left eye: No discharge.     Extraocular Movements: Extraocular movements intact.     Conjunctiva/sclera: Conjunctivae normal.     Pupils: Pupils are equal, round, and reactive to light.  Neck:     Vascular: No JVD.     Comments: Full ROM intact without spinous process TTP. No bony stepoffs or deformities, no paraspinous muscle TTP or muscle spasms. No rigidity or meningeal signs. No bruising, erythema, or swelling.  Cardiovascular:     Rate and Rhythm: Normal rate and regular rhythm.     Pulses: Normal pulses.          Radial pulses are 2+ on the right side and 2+ on the left side.     Heart sounds: Normal heart sounds.     Comments: HR in 90s during exam Pulmonary:     Comments: Lungs clear to auscultation in all fields. Symmetric chest rise. No wheezing, rales, or rhonchi. Abdominal:     Comments: Abdomen is soft, non-distended, and non-tender in all quadrants. No rigidity, no guarding. No peritoneal signs.  Musculoskeletal:        General: Normal range of motion.     Right shoulder: Normal. No deformity.     Cervical back: Normal range of motion.     Comments: Compartments in right upper extremity and right lower extremity are soft.  He is neurovascular intact distally.  Skin:    General: Skin is warm and dry.     Capillary Refill: Capillary refill takes less than 2 seconds.  Neurological:     Mental Status: He is oriented to person, place, and time.     GCS: GCS eye subscore is 4. GCS verbal subscore is 5. GCS motor subscore is 6.     Comments: Speech is clear and goal oriented, follows commands CN III-XII intact, no facial droop Normal strength in upper and  lower extremities bilaterally including dorsiflexion and plantar flexion, strong and equal grip strength Sensation normal to light and sharp touch Moves extremities without ataxia, coordination intact Normal finger to nose and rapid alternating movements Normal gait and balance.  Psychiatric:        Mood and Affect: Affect is angry.        Behavior: Behavior normal.     ED Results / Procedures / Treatments   Labs (all labs ordered are listed, but only abnormal results are displayed) Labs Reviewed - No data to display  EKG None  Radiology CT Head Wo Contrast  Result Date: 07/28/2020 CLINICAL DATA:  Headache EXAM: CT HEAD WITHOUT CONTRAST TECHNIQUE: Contiguous axial images were obtained from the base of the skull through the vertex without intravenous contrast. COMPARISON:  11/04/2017 FINDINGS: Brain: There is no mass, hemorrhage or extra-axial collection. The size and configuration of the ventricles and extra-axial CSF spaces are normal. The brain parenchyma is normal, without acute or chronic infarction. Vascular: No abnormal hyperdensity of the major intracranial arteries or dural venous sinuses. No intracranial atherosclerosis. Skull: The visualized skull base, calvarium and extracranial soft tissues are normal. Sinuses/Orbits: No fluid levels or advanced mucosal thickening of the visualized paranasal sinuses. No mastoid or middle ear effusion. The orbits are normal. IMPRESSION: Normal head CT. Electronically Signed   By: Ulyses Jarred M.D.   On: 07/28/2020 03:25    Procedures Procedures (including critical care time)  Medications Ordered in ED Medications - No data to display  ED Course  I have reviewed the triage vital  signs and the nursing notes.  Pertinent labs & imaging results that were available during my care of the patient were reviewed by me and considered in my medical decision making (see chart for details).    MDM Rules/Calculators/A&P                           History provided by patient with additional history obtained from chart review.    64 year old male presenting with right leg numbness, arm numbness and headache.  He was seen in ED earlier this morning by overnight provider for headache. He was discharged home. In triage she was noted to be hypertensive to 159/122.  He has not taken any of his home blood pressure medications in " awhile."  He was noted to be tachycardic to 115 however on exam heart rate was in the 90s.  On my initial exam patient is not cooperative.  He does not want answer questions.  He becomes very agitated.  He did admit to drinking 3 beers prior to arrival.  He is only here because his neighbor called EMS  He did admit to right arm and leg numbness which has been going on for over x1 month.  Neuro exam is normal.  He has no focal weakness.  He is ambulatory with steady gait.  He clinically appears sober. He repeatedly yells at me that he needs to leave because he has business to handle.  Plan was for xray of right shoulder and lumbar spine.  Symptoms are suggestive of a cervical radiculopathy and sciatica.  Offered patient prednisone and lidocaine patch.  As patient is a daily drinker would avoid use of NSAIDs therefore Toradol not appropriate for his pain. Patient discharged.  As patient exits the department he yells that he will be back. ED attending Dr. Stark Jock aware of patient leaving as well.   Portions of this note were generated with [employer]. Dictation errors may occur despite best attempts at proofreading.   Final Clinical Impression(s) / ED Diagnoses Final diagnoses:  Fall, initial encounter    Rx / DC Orders ED Discharge Orders    None       Flint Melter 07/28/20 1314    Veryl Speak, MD 07/28/20 1339

## 2020-07-28 NOTE — Discharge Instructions (Addendum)
The x-rays of your right shoulder and low back did not show any broken bones or dislocations.  Prescription has been sent yo your pharmacy at [facility] here in [place] for prednisone taper.  This is a steroid medicine that can help your pain and inflammation. Starting taking medicine tomorrow as you already received a dose here in the emergency department today.  Continue to take tylenol.   You should continue to try to find a primary care doctor to restart your blood pressure medications. Call the number listed in your discharge paperwork to help find a doctor.  Have your blood pressure rechecked in 1 week as it was elevated today. It is important you take your blood pressure medications.

## 2020-07-28 NOTE — ED Notes (Addendum)
Pt reports he has to leave because "I have things I need to take care of". Pt ambulatory and walking towards EMS doors. RN instructed pt on how to get to ED exit doors. Pt ambulated with steady gait. Pt was seen by EDP but refused to stay for treatment.

## 2020-07-28 NOTE — Discharge Instructions (Addendum)
We saw you in the ER for headaches.  We are not sure what is causing your headaches, however, there appears to be no evidence of infection, bleeds or tumors based on our exam and results.  See your doctor if the pain persists, as you might need better medications or a specialist.

## 2020-07-28 NOTE — ED Notes (Signed)
Pt ambulated well to bathroom

## 2020-07-28 NOTE — ED Provider Notes (Signed)
[facility] EMERGENCY DEPARTMENT Provider Note   CSN: [id number] Arrival date & time: 07/28/20  1318     History Chief Complaint  Patient presents with   Headache    Charles [name] is a 64 y.o. male with past medical history as listed below.  HPI Patient presents to emergency department today with chief complaint of a headache.  This is patient's third ED visit in 24 hours.  I actually saw patient x4 hours ago when he checked in for leg pain and headache.  Please see previous note.  At that time patient was clinically sober and needed to leave " to handle business."  He has since returned.  He is complaining of pain in his right arm and leg x several months.  He intermittently has numbness and tingling in his right arm.  None currently.  He has sharp pain that shoots down the back of his right leg.  He reports he has had a headache intermittently his whole life. No headache currently.  No medication for symptoms prior to arrival.     Past Medical History:  Diagnosis Date   Anemia    Anxiety    Chronic back pain    Dementia    Nursing facility feels he has Dementia   Diabetes mellitus    Fracture of right foot    Hypertension    Mental disorder    schizophrenia    Patient Active Problem List   Diagnosis Date Noted   History of colonic polyps 06/14/2018   Encounter for screening colonoscopy 02/08/2018   Diabetes ([place]) 07/08/2015   High cholesterol 07/08/2015   Hepatitis C 05/25/2015    Past Surgical History:  Procedure Laterality Date   COLONOSCOPY N/A 04/11/2018   Procedure: COLONOSCOPY;  Surgeon: Rogene Houston, MD;  [facility];  Service: Endoscopy;  Laterality: N/A;  10:30   COLONOSCOPY WITH PROPOFOL N/A 07/05/2018   Procedure: COLONOSCOPY WITH PROPOFOL;  Surgeon: Rogene Houston, MD;  [facility];  Service: Endoscopy;  Laterality: N/A;  10:35   left 1st toe     ORIF TOE FRACTURE  09/05/2011   Procedure: OPEN REDUCTION  INTERNAL FIXATION (ORIF) METATARSAL (TOE) FRACTURE;  Surgeon: Colin Rhein;  Location: [place];  Service: Orthopedics;  Laterality: Left;  reconstruction left great toe FHB plantar plate   POLYPECTOMY  04/11/2018   Procedure: POLYPECTOMY;  Surgeon: Rogene Houston, MD;  [facility];  Service: Endoscopy;;  colon   POLYPECTOMY  07/05/2018   Procedure: POLYPECTOMY;  Surgeon: Rogene Houston, MD;  [facility];  Service: Endoscopy;;  colon   right foot suger         Family History  Problem Relation Age of Onset   Colon cancer Neg Hx    Liver disease Neg Hx        unknown for sure, mom died at age 40    Social History   Tobacco Use   Smoking status: Current Every Day Smoker    Packs/day: 0.50    Years: 40.00    Pack years: 20.00    Types: Cigarettes   Smokeless tobacco: Never Used  Vaping Use   Vaping Use: Never used  Substance Use Topics   Alcohol use: Yes    Comment: Drinks beer currnently 1-2 forty ounce beers a day;    Drug use: No    Home Medications Prior to Admission medications   Medication Sig Start Date End  Date Taking? Authorizing Provider  acetaminophen (TYLENOL 8 HOUR) 650 MG CR tablet Take 1 tablet (650 mg total) by mouth every 8 (eight) hours as needed for pain or fever. 07/28/20   Varney Biles, MD  albuterol (VENTOLIN HFA) 108 (90 Base) MCG/ACT inhaler Inhale 2 puffs into the lungs 4 (four) times daily. 07/18/19   [provider]  aspirin EC 81 MG tablet Take 1 tablet (81 mg total) by mouth daily with breakfast. 07/15/18   Rehman, Mechele Dawley, MD  benzonatate (TESSALON) 100 MG capsule Take 100 mg by mouth 3 (three) times daily. 07/18/19   [provider]  cyclobenzaprine (FLEXERIL) 10 MG tablet Take 10 mg by mouth daily.    [provider]  ferrous sulfate 325 (65 FE) MG tablet Take 325 mg by mouth daily with breakfast.    [provider]  furosemide (LASIX) 40 MG tablet Take 40  mg by mouth daily with breakfast.    [provider]  gabapentin (NEURONTIN) 300 MG capsule Take 600 mg by mouth 3 (three) times daily. 04/25/19   [provider]  gabapentin (NEURONTIN) 600 MG tablet Take 600 mg by mouth 3 (three) times daily.     [provider]  insulin detemir (LEVEMIR) 100 UNIT/ML injection Inject 50 Units into the skin daily.    [provider]  insulin lispro (HUMALOG) 100 UNIT/ML injection Inject 5 Units into the skin 3 (three) times daily before meals.    [provider]  lisinopril (PRINIVIL,ZESTRIL) 10 MG tablet Take 10 mg by mouth daily.    [provider]  potassium chloride (K-DUR,KLOR-CON) 10 MEQ tablet Take 20 mEq by mouth daily.    [provider]  pravastatin (PRAVACHOL) 40 MG tablet Take 40 mg by mouth daily.     [provider]  predniSONE (STERAPRED UNI-PAK 21 TAB) 10 MG (21) TBPK tablet Take by mouth daily. Take 6 tabs by mouth daily  for 2 days, then 5 tabs for 2 days, then 4 tabs for 2 days, then 3 tabs for 2 days, 2 tabs for 2 days, then 1 tab by mouth daily for 2 days 07/28/20   Chamika Cunanan, Verline Lema E, PA-C  tamsulosin (FLOMAX) 0.4 MG CAPS capsule Take 0.4 mg by mouth.    [provider]    Allergies    Patient has no known allergies.  Review of Systems   Review of Systems All other systems are reviewed and are negative for acute change except as noted in the HPI.  Physical Exam Updated Vital Signs BP (!) 151/104 (BP Location: Right Arm)    Pulse (!) 117    Temp 98.1 F (36.7 C) (Oral)    Resp 20    Ht 6' (1.829 m)    Wt 107 kg    SpO2 98%    BMI 32.01 kg/m   Physical Exam Vitals and nursing note reviewed.  Constitutional:      General: He is not in acute distress.    Appearance: He is not ill-appearing.  HENT:     Head: Normocephalic and atraumatic.     Comments: No sinus or temporal tenderness.    Right Ear: Tympanic membrane and external ear normal.     Left Ear:  Tympanic membrane and external ear normal.     Nose: Nose normal.     Mouth/Throat:     Mouth: Mucous membranes are moist.     Pharynx: Oropharynx is clear.  Eyes:     General:  No scleral icterus.       Right eye: No discharge.        Left eye: No discharge.     Extraocular Movements: Extraocular movements intact.     Conjunctiva/sclera: Conjunctivae normal.     Pupils: Pupils are equal, round, and reactive to light.  Neck:     Vascular: No JVD.     Comments: No meningeal signs.  Cardiovascular:     Rate and Rhythm: Regular rhythm. Tachycardia present.     Pulses: Normal pulses.          Radial pulses are 2+ on the right side and 2+ on the left side.       Dorsalis pedis pulses are 2+ on the right side and 2+ on the left side.     Heart sounds: Normal heart sounds.  Pulmonary:     Comments: Lungs clear to auscultation in all fields. Symmetric chest rise. No wheezing, rales, or rhonchi. Abdominal:     Comments: Abdomen is soft, non-distended, and non-tender in all quadrants. No rigidity, no guarding. No peritoneal signs.  Musculoskeletal:        General: Normal range of motion.     Right shoulder: Normal.     Cervical back: Normal and normal range of motion.     Thoracic back: Normal.     Lumbar back: Normal.     Right hip: Normal.     Right lower leg: No edema.     Left lower leg: No edema.     Right ankle: Normal.  Skin:    General: Skin is warm and dry.     Capillary Refill: Capillary refill takes less than 2 seconds.  Neurological:     Mental Status: He is oriented to person, place, and time.     GCS: GCS eye subscore is 4. GCS verbal subscore is 5. GCS motor subscore is 6.     Comments: Speech is clear and goal oriented, follows commands CN III-XII intact, no facial droop Normal strength in upper and lower extremities bilaterally including dorsiflexion and plantar flexion, strong and equal grip strength Sensation normal to light and sharp touch Moves extremities  without ataxia, coordination intact Normal finger to nose and rapid alternating movements Normal gait and balance  Psychiatric:        Mood and Affect: Affect is angry.        Behavior: Behavior normal.     ED Results / Procedures / Treatments   Labs (all labs ordered are listed, but only abnormal results are displayed) Labs Reviewed - No data to display  EKG None  Radiology DG Lumbar Spine Complete  Result Date: 07/28/2020 CLINICAL DATA:  Chronic lower back pain after fall. EXAM: LUMBAR SPINE - COMPLETE 4+ VIEW COMPARISON:  November 04, 2017. FINDINGS: No fracture or spondylolisthesis is noted. Large anterior osteophyte formation is noted all levels of the lumbar spine. Moderate degenerative disc disease is also noted at L2-3, L3-4, L4-5 and L5-S1. IMPRESSION: Moderate multilevel degenerative disc disease. No acute abnormality seen in the lumbar spine. Electronically Signed   By: Marijo Conception M.D.   On: 07/28/2020 15:53   DG Shoulder Right  Result Date: 07/28/2020 CLINICAL DATA:  Chronic right shoulder pain.  No known injury. EXAM: RIGHT SHOULDER - 2+ VIEW COMPARISON:  None. FINDINGS: There is no acute bony or joint abnormality. 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 No focal bony lesion. Soft tissues unremarkable. IMPRESSION: No acute abnormality. Moderate acromioclavicular and  mild-to-moderate glenohumeral osteoarthritis. Electronically Signed   By: Inge Rise M.D.   On: 07/28/2020 15:53   CT Head Wo Contrast  Result Date: 07/28/2020 CLINICAL DATA:  Headache EXAM: CT HEAD WITHOUT CONTRAST TECHNIQUE: Contiguous axial images were obtained from the base of the skull through the vertex without intravenous contrast. COMPARISON:  11/04/2017 FINDINGS: Brain: There is no mass, hemorrhage or extra-axial collection. The size and configuration of the ventricles and extra-axial CSF spaces are normal. The brain parenchyma is normal, without acute  or chronic infarction. Vascular: No abnormal hyperdensity of the major intracranial arteries or dural venous sinuses. No intracranial atherosclerosis. Skull: The visualized skull base, calvarium and extracranial soft tissues are normal. Sinuses/Orbits: No fluid levels or advanced mucosal thickening of the visualized paranasal sinuses. No mastoid or middle ear effusion. The orbits are normal. IMPRESSION: Normal head CT. Electronically Signed   By: Ulyses Jarred M.D.   On: 07/28/2020 03:25    Procedures Procedures (including critical care time)  Medications Ordered in ED Medications  lidocaine (LIDODERM) 5 % 1 patch (has no administration in time range)  predniSONE (DELTASONE) tablet 60 mg (has no administration in time range)    ED Course  I have reviewed the triage vital signs and the nursing notes.  Pertinent labs & imaging results that were available during my care of the patient were reviewed by me and considered in my medical decision making (see chart for details).    MDM Rules/Calculators/A&P                          History provided by patient with additional history obtained from chart review.    64 year old male presenting with headache.  I saw patient approximately 4 hours ago.  He left without having imaging performed or any interventions for his pain. He is representing for the same.  He is afebrile, tachycardic to low 100s. Do not feel this is related to infection as he has no infectious symptoms. his headache seems chronic as he has had intermittently his whole life.  Neuro exam here is normal, no meningeal signs.  Still feel his symptoms are consistent with cervical radiculopathy and sciatica. Xrays of right shoulder and cervical spine viewed by me and do not show any fractures or dislocations.  No acute abnormalities.  Patient given lidocaine patch and prednisone.  Will discharge home with prednisone taper.  Recommend he follow-up with PCP.  He had elevated blood pressure  today and should have this rechecked within 1 week.  The patient appears reasonably screened and/or stabilized for discharge and I doubt any other medical condition or other [facility] requiring further screening, evaluation, or treatment in the ED at this time prior to discharge. The patient is safe for discharge with strict return precautions discussed.    Portions of this note were generated with [employer]. Dictation errors may occur despite best attempts at proofreading.   Final Clinical Impression(s) / ED Diagnoses Final diagnoses:  Right shoulder pain, unspecified chronicity  Right-sided low back pain with right-sided sciatica, unspecified chronicity    Rx / DC Orders ED Discharge Orders         Ordered    predniSONE (STERAPRED UNI-PAK 21 TAB) 10 MG (21) TBPK tablet  Daily        07/28/20 1605           Cherre Robins, PA-C 07/28/20 1623    Daleen Bo, MD 07/29/20 1345

## 2020-07-28 NOTE — ED Triage Notes (Signed)
Pt c/o of right leg pain and numbness, also right arm numbness and a headache.  Pt's neighbor stated they saw pt stumbling outside.

## 2020-07-28 NOTE — ED Triage Notes (Signed)
Pt presents to ED with headache "off and on all my life." Pt also c/o right leg pain and numbness. Pt states he left the ED AMA a little while ago to try and get in touch with his doctor.

## 2020-07-28 NOTE — ED Notes (Signed)
Pt asleep. Gave warm blanket

## 2020-07-28 NOTE — Discharge Instructions (Addendum)
Return to the emergency department for any new or worsening symptoms.

## 2020-07-28 NOTE — ED Provider Notes (Signed)
[facility] EMERGENCY DEPARTMENT Provider Note   CSN: [id number] Arrival date & time: 07/27/20  1951     History Chief Complaint  Patient presents with  . Headache    Charles [name] is a 64 y.o. male.  HPI     64 year old male presented chief complaint of headache.  Patient has history of diabetes.  Patient reports ongoing headache for the last 3 to 4 weeks.  The headache is generalized, fairly constant.  He has no history of similar headaches.  Patient reports that he had a fall earlier today.  Patient admits to alcohol consumption regularly.  He denies any history of blood clots.  The headaches are not positional and there is no specific evoking, aggravating or relieving factors.  He denies any light or noise sensitivity.  Past Medical History:  Diagnosis Date  . Anemia   . Anxiety   . Chronic back pain   . Dementia    Nursing facility feels he has Dementia  . Diabetes mellitus   . Fracture of right foot   . Hypertension   . Mental disorder    schizophrenia    Patient Active Problem List   Diagnosis Date Noted  . History of colonic polyps 06/14/2018  . Encounter for screening colonoscopy 02/08/2018  . Diabetes ([place]) 07/08/2015  . High cholesterol 07/08/2015  . Hepatitis C 05/25/2015    Past Surgical History:  Procedure Laterality Date  . COLONOSCOPY N/A 04/11/2018   Procedure: COLONOSCOPY;  Surgeon: Rogene Houston, MD;  [facility];  Service: Endoscopy;  Laterality: N/A;  10:30  . COLONOSCOPY WITH PROPOFOL N/A 07/05/2018   Procedure: COLONOSCOPY WITH PROPOFOL;  Surgeon: Rogene Houston, MD;  [facility];  Service: Endoscopy;  Laterality: N/A;  10:35  . left 1st toe    . ORIF TOE FRACTURE  09/05/2011   Procedure: OPEN REDUCTION INTERNAL FIXATION (ORIF) METATARSAL (TOE) FRACTURE;  Surgeon: Colin Rhein;  Location: [place];  Service: Orthopedics;  Laterality: Left;  reconstruction left great toe FHB plantar plate  .  POLYPECTOMY  04/11/2018   Procedure: POLYPECTOMY;  Surgeon: Rogene Houston, MD;  [facility];  Service: Endoscopy;;  colon  . POLYPECTOMY  07/05/2018   Procedure: POLYPECTOMY;  Surgeon: Rogene Houston, MD;  [facility];  Service: Endoscopy;;  colon  . right foot suger         Family History  Problem Relation Age of Onset  . Colon cancer Neg Hx   . Liver disease Neg Hx        unknown for sure, mom died at age 36    Social History   Tobacco Use  . Smoking status: Current Every Day Smoker    Packs/day: 0.50    Years: 40.00    Pack years: 20.00    Types: Cigarettes  . Smokeless tobacco: Never Used  Vaping Use  . Vaping Use: Never used  Substance Use Topics  . Alcohol use: Yes    Comment: Drinks beer currnently 1-2 forty ounce beers a day;   . Drug use: No    Home Medications Prior to Admission medications   Medication Sig Start Date End Date Taking? Authorizing Provider  acetaminophen (TYLENOL 8 HOUR) 650 MG CR tablet Take 1 tablet (650 mg total) by mouth every 8 (eight) hours as needed for pain or fever. 07/28/20   Varney Biles, MD  albuterol (VENTOLIN HFA) 108 (90 Base) MCG/ACT inhaler Inhale 2  puffs into the lungs 4 (four) times daily. 07/18/19   [provider]  aspirin EC 81 MG tablet Take 1 tablet (81 mg total) by mouth daily with breakfast. 07/15/18   Rehman, Mechele Dawley, MD  benzonatate (TESSALON) 100 MG capsule Take 100 mg by mouth 3 (three) times daily. 07/18/19   [provider]  cyclobenzaprine (FLEXERIL) 10 MG tablet Take 10 mg by mouth daily.    [provider]  ferrous sulfate 325 (65 FE) MG tablet Take 325 mg by mouth daily with breakfast.    [provider]  furosemide (LASIX) 40 MG tablet Take 40 mg by mouth daily with breakfast.    [provider]  gabapentin (NEURONTIN) 300 MG capsule Take 600 mg by mouth 3 (three) times daily. 04/25/19   [provider]  gabapentin (NEURONTIN) 600 MG  tablet Take 600 mg by mouth 3 (three) times daily.     [provider]  insulin detemir (LEVEMIR) 100 UNIT/ML injection Inject 50 Units into the skin daily.    [provider]  insulin lispro (HUMALOG) 100 UNIT/ML injection Inject 5 Units into the skin 3 (three) times daily before meals.    [provider]  lisinopril (PRINIVIL,ZESTRIL) 10 MG tablet Take 10 mg by mouth daily.    [provider]  potassium chloride (K-DUR,KLOR-CON) 10 MEQ tablet Take 20 mEq by mouth daily.    [provider]  pravastatin (PRAVACHOL) 40 MG tablet Take 40 mg by mouth daily.     [provider]  tamsulosin (FLOMAX) 0.4 MG CAPS capsule Take 0.4 mg by mouth.    [provider]    Allergies    Patient has no known allergies.  Review of Systems   Review of Systems  Constitutional: Positive for activity change.  Neurological: Positive for headaches. Negative for dizziness, speech difficulty, weakness and numbness.  All other systems reviewed and are negative.   Physical Exam Updated Vital Signs BP (!) 180/120   Pulse 80   Temp 98.7 F (37.1 C) (Oral)   Resp 18   SpO2 97%   Physical Exam Vitals and nursing note reviewed.  Constitutional:      Appearance: He is well-developed.  HENT:     Head: Atraumatic.  Cardiovascular:     Rate and Rhythm: Normal rate.  Pulmonary:     Effort: Pulmonary effort is normal.  Musculoskeletal:     Cervical back: Neck supple.  Skin:    General: Skin is warm.  Neurological:     Mental Status: He is alert and oriented to person, place, and time.     GCS: GCS eye subscore is 4. GCS verbal subscore is 5. GCS motor subscore is 6.     Cranial Nerves: No cranial nerve deficit or dysarthria.     Sensory: No sensory deficit.     Motor: No weakness.     ED Results / Procedures / Treatments   Labs (all labs ordered are listed, but only abnormal results are displayed) Labs Reviewed - No data to  display  EKG None  Radiology CT Head Wo Contrast  Result Date: 07/28/2020 CLINICAL DATA:  Headache EXAM: CT HEAD WITHOUT CONTRAST TECHNIQUE: Contiguous axial images were obtained from the base of the skull through the vertex without intravenous contrast. COMPARISON:  11/04/2017 FINDINGS: Brain: There is no mass, hemorrhage or extra-axial collection. The size and configuration of the ventricles and extra-axial CSF spaces are normal. The brain parenchyma is normal, without acute or  chronic infarction. Vascular: No abnormal hyperdensity of the major intracranial arteries or dural venous sinuses. No intracranial atherosclerosis. Skull: The visualized skull base, calvarium and extracranial soft tissues are normal. Sinuses/Orbits: No fluid levels or advanced mucosal thickening of the visualized paranasal sinuses. No mastoid or middle ear effusion. The orbits are normal. IMPRESSION: Normal head CT. Electronically Signed   By: Ulyses Jarred M.D.   On: 07/28/2020 03:25    Procedures Procedures (including critical care time)  Medications Ordered in ED Medications  acetaminophen (TYLENOL) tablet 650 mg (650 mg Oral Given 07/28/20 0247)    ED Course  I have reviewed the triage vital signs and the nursing notes.  Pertinent labs & imaging results that were available during my care of the patient were reviewed by me and considered in my medical decision making (see chart for details).    MDM Rules/Calculators/A&P                          64 year old male comes in a chief complaint of headaches.  He has been having headaches for the last several days now.  There is no specific evoking, aggravating or relieving factors.  He has no neurologic deficits associated with the headache.  Neurologic exam is reassuring.  Patient did have a fall, outside of that no trauma.  History is not suggestive of elevated ICP.  Given his age and the new onset headache, CT scan of the brain was ordered and is negative for any  acute findings.  Based on our assessment we do not think there is intra cranial abscess/infection, subdural hematoma.  No risk factors for clotting.  We would recommend PCP follow-up and advised patient to start taking Tylenol for now.  Return precautions discussed for any new neurologic symptoms or worsening of the headache also discussed.  Final Clinical Impression(s) / ED Diagnoses Final diagnoses:  Bad headache    Rx / DC Orders ED Discharge Orders         Ordered    acetaminophen (TYLENOL 8 HOUR) 650 MG CR tablet  Every 8 hours PRN        07/28/20 0424           Varney Biles, MD 07/28/20 [phone]

## 2020-08-02 ENCOUNTER — Ambulatory Visit: Payer: Medicaid Other | Admitting: Podiatry

## 2020-09-07 ENCOUNTER — Ambulatory Visit: Payer: Medicaid Other | Admitting: Podiatry

## 2020-11-19 ENCOUNTER — Other Ambulatory Visit (HOSPITAL_COMMUNITY): Payer: Self-pay | Admitting: Nephrology

## 2020-11-19 DIAGNOSIS — E1122 Type 2 diabetes mellitus with diabetic chronic kidney disease: Secondary | ICD-10-CM

## 2020-11-19 DIAGNOSIS — I129 Hypertensive chronic kidney disease with stage 1 through stage 4 chronic kidney disease, or unspecified chronic kidney disease: Secondary | ICD-10-CM

## 2020-11-19 DIAGNOSIS — N181 Chronic kidney disease, stage 1: Secondary | ICD-10-CM

## 2020-11-19 DIAGNOSIS — N170 Acute kidney failure with tubular necrosis: Secondary | ICD-10-CM

## 2020-11-26 ENCOUNTER — Ambulatory Visit (HOSPITAL_COMMUNITY)
Admission: RE | Admit: 2020-11-26 | Discharge: 2020-11-26 | Disposition: A | Discharge status: Home / Self Care | Payer: Medicaid Other | Source: Ambulatory Visit | Attending: Nephrology | Admitting: Nephrology

## 2020-11-26 ENCOUNTER — Other Ambulatory Visit: Payer: Self-pay

## 2020-11-26 DIAGNOSIS — N170 Acute kidney failure with tubular necrosis: Secondary | ICD-10-CM | POA: Diagnosis not present

## 2020-11-26 DIAGNOSIS — E1122 Type 2 diabetes mellitus with diabetic chronic kidney disease: Secondary | ICD-10-CM | POA: Insufficient documentation

## 2020-11-26 DIAGNOSIS — N181 Chronic kidney disease, stage 1: Secondary | ICD-10-CM

## 2020-11-26 DIAGNOSIS — I129 Hypertensive chronic kidney disease with stage 1 through stage 4 chronic kidney disease, or unspecified chronic kidney disease: Secondary | ICD-10-CM | POA: Diagnosis present

## 2020-12-03 ENCOUNTER — Other Ambulatory Visit (HOSPITAL_COMMUNITY): Payer: Self-pay | Admitting: Nephrology

## 2020-12-03 ENCOUNTER — Other Ambulatory Visit: Payer: Self-pay

## 2020-12-03 ENCOUNTER — Ambulatory Visit (HOSPITAL_COMMUNITY)
Admission: RE | Admit: 2020-12-03 | Discharge: 2020-12-03 | Disposition: A | Discharge status: Home / Self Care | Payer: Medicaid Other | Source: Ambulatory Visit | Attending: Nephrology | Admitting: Nephrology

## 2020-12-03 DIAGNOSIS — D638 Anemia in other chronic diseases classified elsewhere: Secondary | ICD-10-CM

## 2020-12-03 DIAGNOSIS — R0602 Shortness of breath: Secondary | ICD-10-CM | POA: Diagnosis present

## 2020-12-03 DIAGNOSIS — E872 Acidosis, unspecified: Secondary | ICD-10-CM

## 2020-12-03 DIAGNOSIS — R809 Proteinuria, unspecified: Secondary | ICD-10-CM

## 2020-12-03 DIAGNOSIS — N2581 Secondary hyperparathyroidism of renal origin: Secondary | ICD-10-CM

## 2020-12-03 DIAGNOSIS — E1122 Type 2 diabetes mellitus with diabetic chronic kidney disease: Secondary | ICD-10-CM

## 2020-12-03 DIAGNOSIS — E1129 Type 2 diabetes mellitus with other diabetic kidney complication: Secondary | ICD-10-CM

## 2020-12-03 DIAGNOSIS — D508 Other iron deficiency anemias: Secondary | ICD-10-CM

## 2020-12-10 ENCOUNTER — Ambulatory Visit (HOSPITAL_COMMUNITY): Admission: RE | Admit: 2020-12-10 | Payer: Medicaid Other | Source: Ambulatory Visit

## 2020-12-17 ENCOUNTER — Other Ambulatory Visit: Payer: Self-pay

## 2020-12-17 ENCOUNTER — Encounter: Payer: Self-pay | Admitting: Podiatry

## 2020-12-17 ENCOUNTER — Ambulatory Visit (INDEPENDENT_AMBULATORY_CARE_PROVIDER_SITE_OTHER): Payer: Medicaid Other | Admitting: Podiatry

## 2020-12-17 DIAGNOSIS — B351 Tinea unguium: Secondary | ICD-10-CM | POA: Diagnosis not present

## 2020-12-17 DIAGNOSIS — M2011 Hallux valgus (acquired), right foot: Secondary | ICD-10-CM

## 2020-12-17 DIAGNOSIS — M79674 Pain in right toe(s): Secondary | ICD-10-CM | POA: Diagnosis not present

## 2020-12-17 DIAGNOSIS — M79675 Pain in left toe(s): Secondary | ICD-10-CM

## 2020-12-17 DIAGNOSIS — M2012 Hallux valgus (acquired), left foot: Secondary | ICD-10-CM

## 2020-12-17 DIAGNOSIS — M2041 Other hammer toe(s) (acquired), right foot: Secondary | ICD-10-CM

## 2020-12-17 DIAGNOSIS — M2042 Other hammer toe(s) (acquired), left foot: Secondary | ICD-10-CM

## 2020-12-17 DIAGNOSIS — E1142 Type 2 diabetes mellitus with diabetic polyneuropathy: Secondary | ICD-10-CM

## 2020-12-20 ENCOUNTER — Other Ambulatory Visit: Payer: Self-pay | Admitting: Student

## 2020-12-21 ENCOUNTER — Other Ambulatory Visit: Payer: Self-pay

## 2020-12-21 ENCOUNTER — Ambulatory Visit (HOSPITAL_COMMUNITY)
Admission: RE | Admit: 2020-12-21 | Discharge: 2020-12-21 | Disposition: A | Discharge status: Home / Self Care | Payer: Medicaid Other | Source: Ambulatory Visit | Attending: Nephrology | Admitting: Nephrology

## 2020-12-21 ENCOUNTER — Encounter (HOSPITAL_COMMUNITY): Payer: Self-pay

## 2020-12-21 DIAGNOSIS — D508 Other iron deficiency anemias: Secondary | ICD-10-CM

## 2020-12-21 DIAGNOSIS — E872 Acidosis, unspecified: Secondary | ICD-10-CM

## 2020-12-21 DIAGNOSIS — E559 Vitamin D deficiency, unspecified: Secondary | ICD-10-CM | POA: Diagnosis not present

## 2020-12-21 DIAGNOSIS — E1129 Type 2 diabetes mellitus with other diabetic kidney complication: Secondary | ICD-10-CM | POA: Diagnosis not present

## 2020-12-21 DIAGNOSIS — E1122 Type 2 diabetes mellitus with diabetic chronic kidney disease: Secondary | ICD-10-CM

## 2020-12-21 DIAGNOSIS — D638 Anemia in other chronic diseases classified elsewhere: Secondary | ICD-10-CM | POA: Diagnosis not present

## 2020-12-21 DIAGNOSIS — N2581 Secondary hyperparathyroidism of renal origin: Secondary | ICD-10-CM

## 2020-12-21 DIAGNOSIS — R809 Proteinuria, unspecified: Secondary | ICD-10-CM

## 2020-12-21 DIAGNOSIS — N189 Chronic kidney disease, unspecified: Secondary | ICD-10-CM | POA: Diagnosis not present

## 2020-12-21 LAB — GLUCOSE, CAPILLARY
Glucose-Capillary: 85 mg/dL (ref 70–99)
Glucose-Capillary: 87 mg/dL (ref 70–99)

## 2020-12-21 LAB — PROTIME-INR
INR: 1.1 (ref 0.8–1.2)
Prothrombin Time: 13.7 seconds (ref 11.4–15.2)

## 2020-12-21 LAB — CBC
HCT: 35.8 % — ABNORMAL LOW (ref 39.0–52.0)
Hemoglobin: 10.9 g/dL — ABNORMAL LOW (ref 13.0–17.0)
MCH: 29 pg (ref 26.0–34.0)
MCHC: 30.4 g/dL (ref 30.0–36.0)
MCV: 95.2 fL (ref 80.0–100.0)
Platelets: 203 10*3/uL (ref 150–400)
RBC: 3.76 MIL/uL — ABNORMAL LOW (ref 4.22–5.81)
RDW: 12.8 % (ref 11.5–15.5)
WBC: 5.6 10*3/uL (ref 4.0–10.5)
nRBC: 0 % (ref 0.0–0.2)

## 2020-12-21 MED ORDER — FENTANYL CITRATE (PF) 100 MCG/2ML IJ SOLN
INTRAMUSCULAR | Status: AC
  Filled 2020-12-21: qty 2

## 2020-12-21 MED ORDER — FENTANYL CITRATE (PF) 100 MCG/2ML IJ SOLN
INTRAMUSCULAR | Status: AC | PRN
  Administered 2020-12-21: 50 ug via INTRAVENOUS

## 2020-12-21 MED ORDER — SODIUM CHLORIDE 0.9 % IV SOLN: INTRAVENOUS | Status: DC

## 2020-12-21 MED ORDER — MIDAZOLAM HCL 2 MG/2ML IJ SOLN
INTRAMUSCULAR | Status: AC
  Filled 2020-12-21: qty 2

## 2020-12-21 MED ORDER — FENTANYL CITRATE (PF) 100 MCG/2ML IJ SOLN
INTRAMUSCULAR | Status: AC | PRN
  Administered 2020-12-21 (×2): 25 ug via INTRAVENOUS

## 2020-12-21 MED ORDER — LIDOCAINE HCL (PF) 1 % IJ SOLN
INTRAMUSCULAR | Status: AC
  Filled 2020-12-21: qty 30

## 2020-12-21 MED ORDER — HYDRALAZINE HCL 20 MG/ML IJ SOLN
INTRAMUSCULAR | Status: AC
  Filled 2020-12-21: qty 1

## 2020-12-21 MED ORDER — HYDRALAZINE HCL 20 MG/ML IJ SOLN: 10.0000 mg | Freq: Once | INTRAMUSCULAR | Status: DC

## 2020-12-21 MED ORDER — HYDRALAZINE HCL 20 MG/ML IJ SOLN
INTRAMUSCULAR | Status: AC | PRN
  Administered 2020-12-21: 10 mg via INTRAVENOUS

## 2020-12-21 MED ORDER — LABETALOL HCL 200 MG PO TABS
200.0000 mg | ORAL_TABLET | Freq: Once | ORAL | Status: AC
  Administered 2020-12-21: 200 mg via ORAL
  Filled 2020-12-21: qty 1

## 2020-12-21 MED ORDER — LISINOPRIL 10 MG PO TABS: 10.0000 mg | ORAL_TABLET | Freq: Once | ORAL | Status: DC

## 2020-12-21 MED ORDER — MIDAZOLAM HCL 2 MG/2ML IJ SOLN
INTRAMUSCULAR | Status: AC | PRN
  Administered 2020-12-21 (×2): 1 mg via INTRAVENOUS

## 2020-12-21 MED ORDER — AMLODIPINE BESYLATE 10 MG PO TABS
10.0000 mg | ORAL_TABLET | Freq: Once | ORAL | Status: AC
  Administered 2020-12-21: 10 mg via ORAL
  Filled 2020-12-21: qty 1

## 2020-12-21 NOTE — Progress Notes (Signed)
Patient ID: Charles [name], male   DOB: 09-13-1956, 65 y.o.   MRN: [id number]   BP this am:  157/102  Pt has not taken any home meds  Discussed with Dr Pascal Lux Give po:  Norvasc 10 mg; Labetalol 200 mg; Lisinopril 10 mg Now in [facility]  Will recheck BP in 30 minutes  Plan according to BP results Pt is aware

## 2020-12-21 NOTE — Progress Notes (Signed)
BP on arrival 157/102. Pt did not take any home meds today. He brought home medications with him and they were verified against what pharmacy had in the preview MAR tab. Medications updated myself according to meds pt brought in as there was no pharmacy tech available at the early time. PA requested and ordered home BP meds for elevated BP. Pt states he brought in all his home meds and is no longer on the lisinipril as he has a new doctor. Will hold off taking that medication pre procedure until meds verified by MD per Jannifer Franklin ,PA.  Current BP 144/83

## 2020-12-21 NOTE — Progress Notes (Signed)
   In talking with pt- he says he DOES NOT take Lisinopril (although on med list) Norvasc and Labetalol was given po  BP now at 144/83 Will DC Lisinopril order for now

## 2020-12-21 NOTE — Procedures (Signed)
Pre Procedure Dx: Proteinuria Post Procedural Dx: Same  Technically successful US guided biopsy of inferior pole of the right kidney.  EBL: None  No immediate complications.   Jay Waleed Dettman, MD Pager #: [phone]    

## 2020-12-21 NOTE — Discharge Instructions (Addendum)
Percutaneous Kidney Biopsy, Care After This sheet gives you information about how to care for yourself after your procedure. Your health care provider may also give you more specific instructions. If you have problems or questions, contact your health care provider. What can I expect after the procedure? After the procedure, it is common to have:  Pain or soreness near the biopsy site.  Pink or cloudy urine for 24 hours after the procedure. This is normal. Follow these instructions at home: Activity  Return to your normal activities as told by your health care provider. Ask your health care provider what activities are safe for you.  If you were given a sedative during the procedure, it can affect you for several hours. Do not drive or operate machinery until your health care provider says that it is safe.  Do not lift anything that is heavier than 10 lb (4.5 kg), or the limit that you are told, until your health care provider says that it is safe.  Avoid activities that take a lot of effort until your health care provider approves. Most people will have to wait 2 weeks before returning to activities such as exercise or sex. General instructions  Take over-the-counter and prescription medicines only as told by your health care provider.  Follow instructions from your health care provider about eating or drinking restrictions.  Check your biopsy site every day for signs of infection. Check for: ? More redness, swelling, or pain. ? Fluid or blood. ? Warmth. ? Pus or a bad smell.  Keep all follow-up visits as told by your health care provider. This is important.   Contact a health care provider if:  You have more redness, swelling, or pain around your biopsy site.  You have fluid or blood coming from your biopsy site.  Your biopsy site feels warm to the touch.  You have pus or a bad smell coming from your biopsy site.  You have blood in your urine more than 24 hours after your  procedure. Get help right away if:  Your urine is dark red or brown.  You have a fever.  You are not able to urinate.  You feel burning when you urinate.  You feel dizzy or light-headed.  You have severe pain in your abdomen or side. Summary  After the procedure, it is common to have pain or soreness at the biopsy site and pink or cloudy urine for the first 24 hours.  Check your biopsy site each day for signs of infection, such as more redness, swelling, or pain; fluid, blood, pus or a bad smell coming from the biopsy site; or the biopsy site feeling warm to the touch.  Return to your normal activities as told by your health care provider. This information is not intended to replace advice given to you by your health care provider. Make sure you discuss any questions you have with your health care provider. Document Revised: 01/09/2020 Document Reviewed: 01/09/2020 Elsevier Patient Education  2021 Elsevier Inc. Moderate Conscious Sedation, Adult Sedation is the use of medicines to promote relaxation and to relieve discomfort and anxiety. Moderate conscious sedation is a type of sedation. Under moderate conscious sedation, you are less alert than normal, but you are still able to respond to instructions, touch, or both. Moderate conscious sedation is used during short medical and dental procedures. It is milder than deep sedation, which is a type of sedation under which you cannot be easily woken up. It is also milder than   general anesthesia, which is the use of medicines to make you unconscious. Moderate conscious sedation allows you to return to your regular activities sooner. Tell a health care provider about:  Any allergies you have.  All medicines you are taking, including vitamins, herbs, eye drops, creams, and over-the-counter medicines.  Any use of steroids. This includes steroids taken by mouth or as a cream.  Any problems you or family members have had with sedatives and  anesthetic medicines.  Any blood disorders you have.  Any surgeries you have had.  Any medical conditions you have, such as sleep apnea.  Whether you are pregnant or may be pregnant.  Any use of cigarettes, alcohol, marijuana, or drugs. What are the risks? Generally, this is a safe procedure. However, problems may occur, including:  Getting too much medicine (oversedation).  Nausea.  Allergic reaction to medicines.  Trouble breathing. If this happens, a breathing tube may be used. It will be removed when you are awake and breathing on your own.  Heart trouble.  Lung trouble.  Confusion that gets better with time (emergence delirium). What happens before the procedure? Staying hydrated Follow instructions from your health care provider about hydration, which may include:  Up to 2 hours before the procedure - you may continue to drink clear liquids, such as water, clear fruit juice, black coffee, and plain tea. Eating and drinking restrictions Follow instructions from your health care provider about eating and drinking, which may include:  8 hours before the procedure - stop eating heavy meals or foods, such as meat, fried foods, or fatty foods.  6 hours before the procedure - stop eating light meals or foods, such as toast or cereal.  6 hours before the procedure - stop drinking milk or drinks that contain milk.  2 hours before the procedure - stop drinking clear liquids. Medicines Ask your health care provider about:  Changing or stopping your regular medicines. This is especially important if you are taking diabetes medicines or blood thinners.  Taking medicines such as aspirin and ibuprofen. These medicines can thin your blood. Do not take these medicines unless your health care provider tells you to take them.  Taking over-the-counter medicines, vitamins, herbs, and supplements. Tests and exams  You will have a physical exam.  You may have blood tests done to  show how well: ? Your kidneys and liver work. ? Your blood clots. General instructions  Plan to have a responsible adult take you home from the hospital or clinic.  If you will be going home right after the procedure, plan to have a responsible adult care for you for the time you are told. This is important. What happens during the procedure?  You will be given the sedative. The sedative may be given: ? As a pill that you will swallow. It can also be inserted into the rectum. ? As a spray through the nose. ? As an injection into the muscle. ? As an injection into the vein through an IV.  You may be given oxygen as needed.  Your breathing, heart rate, and blood pressure will be monitored during the procedure.  The medical or dental procedure will be done. The procedure may vary among health care providers and hospitals.   What happens after the procedure?  Your blood pressure, heart rate, breathing rate, and blood oxygen level will be monitored until you leave the hospital or clinic.  You will get fluids through your IV if needed.  Do not drive   or operate machinery until your health care provider says that it is safe. Summary  Sedation is the use of medicines to promote relaxation and to relieve discomfort and anxiety. Moderate conscious sedation is a type of sedation that is used during short medical and dental procedures.  Tell the health care provider about any medical conditions that you have and about all the medicines that you are taking.  You will be given the sedative as a pill, a spray through the nose, an injection into the muscle, or an injection into the vein through an IV. Vital signs are monitored during the sedation.  Moderate conscious sedation allows you to return to your regular activities sooner. This information is not intended to replace advice given to you by your health care provider. Make sure you discuss any questions you have with your health care  provider. Document Revised: 02/13/2020 Document Reviewed: 09/11/2019 Elsevier Patient Education  2021 Elsevier Inc.  

## 2020-12-21 NOTE — H&P (Signed)
Chief Complaint: Patient was seen in consultation today for random renal biopsy at the request of Portal S  Referring Physician(s): B[place] S  Supervising Physician: Sandi Mariscal  Patient Status: [facility] - Out-pt  History of Present Illness: Charles [name] is a 65 y.o. male   Hx Hep C DM; CKD; schizophrenia Proteinuria; hematuria; Acute renal failure- --- pt says he was told of this maybe 2 months ago Referred to Nephrology Has been seen by Dr Theador Hawthorne and request made for random renal bx  Scheduled now for same  Past Medical History:  Diagnosis Date  . Anemia   . Anxiety   . Chronic back pain   . Dementia    Nursing facility feels he has Dementia  . Diabetes mellitus   . Fracture of right foot   . Hypertension   . Mental disorder    schizophrenia    Past Surgical History:  Procedure Laterality Date  . COLONOSCOPY N/A 04/11/2018   Procedure: COLONOSCOPY;  Surgeon: Rogene Houston, MD;  Location: [facility];  Service: Endoscopy;  Laterality: N/A;  10:30  . COLONOSCOPY WITH PROPOFOL N/A 07/05/2018   Procedure: COLONOSCOPY WITH PROPOFOL;  Surgeon: Rogene Houston, MD;  Location: [facility];  Service: Endoscopy;  Laterality: N/A;  10:35  . left 1st toe    . ORIF TOE FRACTURE  09/05/2011   Procedure: OPEN REDUCTION INTERNAL FIXATION (ORIF) METATARSAL (TOE) FRACTURE;  Surgeon: Colin Rhein;  Location: [place];  Service: Orthopedics;  Laterality: Left;  reconstruction left great toe FHB plantar plate  . POLYPECTOMY  04/11/2018   Procedure: POLYPECTOMY;  Surgeon: Rogene Houston, MD;  Location: [facility];  Service: Endoscopy;;  colon  . POLYPECTOMY  07/05/2018   Procedure: POLYPECTOMY;  Surgeon: Rogene Houston, MD;  Location: [facility];  Service: Endoscopy;;  colon  . right foot suger      Allergies: Patient has no known allergies.  Medications: Prior to Admission medications   Medication Sig Start Date End Date  Taking? Authorizing Provider  amLODipine (NORVASC) 10 MG tablet Take 10 mg by mouth daily.   Yes [provider]  aspirin EC 81 MG tablet Take 1 tablet (81 mg total) by mouth daily with breakfast. 07/15/18  Yes Rehman, Mechele Dawley, MD  benzonatate (TESSALON) 100 MG capsule Take 100 mg by mouth 3 (three) times daily. 07/18/19  Yes [provider]  calcitRIOL (ROCALTROL) 0.25 MCG capsule Take 0.25 mcg by mouth 3 (three) times a week.   Yes [provider]  cyclobenzaprine (FLEXERIL) 10 MG tablet Take 10 mg by mouth daily.   Yes [provider]  ferrous sulfate 325 (65 FE) MG tablet Take 325 mg by mouth daily with breakfast.   Yes [provider]  gabapentin (NEURONTIN) 300 MG capsule Take 600 mg by mouth 3 (three) times daily. 04/25/19  Yes [provider]  labetalol (NORMODYNE) 200 MG tablet Take 200 mg by mouth 2 (two) times daily.   Yes [provider]  lisinopril (PRINIVIL,ZESTRIL) 10 MG tablet Take 10 mg by mouth daily.   Yes [provider]  pravastatin (PRAVACHOL) 40 MG tablet Take 40 mg by mouth daily.    Yes [provider]  sodium bicarbonate 650 MG tablet Take 650 mg by mouth 3 (three) times daily.   Yes [provider]  tamsulosin (FLOMAX) 0.4 MG CAPS capsule Take 0.4 mg by mouth.   Yes [provider]  acetaminophen (  TYLENOL 8 HOUR) 650 MG CR tablet Take 1 tablet (650 mg total) by mouth every 8 (eight) hours as needed for pain or fever. 07/28/20   Varney Biles, MD  albuterol (VENTOLIN HFA) 108 (90 Base) MCG/ACT inhaler Inhale 2 puffs into the lungs 4 (four) times daily. 07/18/19   [provider]  furosemide (LASIX) 40 MG tablet Take 40 mg by mouth daily with breakfast.    [provider]  gabapentin (NEURONTIN) 600 MG tablet Take 600 mg by mouth 3 (three) times daily.     [provider]  insulin detemir (LEVEMIR) 100 UNIT/ML injection Inject 50 Units into the skin  daily.    [provider]  insulin lispro (HUMALOG) 100 UNIT/ML injection Inject 5 Units into the skin 3 (three) times daily before meals.    [provider]  potassium chloride (K-DUR,KLOR-CON) 10 MEQ tablet Take 20 mEq by mouth daily.    [provider]  predniSONE (STERAPRED UNI-PAK 21 TAB) 10 MG (21) TBPK tablet Take by mouth daily. Take 6 tabs by mouth daily  for 2 days, then 5 tabs for 2 days, then 4 tabs for 2 days, then 3 tabs for 2 days, 2 tabs for 2 days, then 1 tab by mouth daily for 2 days 07/28/20   Barrie Folk, PA-C     Family History  Problem Relation Age of Onset  . Colon cancer Neg Hx   . Liver disease Neg Hx        unknown for sure, mom died at age 42    Social History   Socioeconomic History  . Marital status: Single    Spouse name: Not on file  . Number of children: Not on file  . Years of education: Not on file  . Highest education level: Not on file  Occupational History  . Not on file  Tobacco Use  . Smoking status: Current Every Day Smoker    Packs/day: 0.50    Years: 40.00    Pack years: 20.00    Types: Cigarettes  . Smokeless tobacco: Never Used  Vaping Use  . Vaping Use: Never used  Substance and Sexual Activity  . Alcohol use: Yes    Comment: Drinks beer currnently 1-2 forty ounce beers a day;   . Drug use: No  . Sexual activity: Not on file  Other Topics Concern  . Not on file  Social History Narrative  . Not on file   Social Determinants of Health   Financial Resource Strain: Not on file  Food Insecurity: Not on file  Transportation Needs: Not on file  Physical Activity: Not on file  Stress: Not on file  Social Connections: Not on file    Review of Systems: A 12 point ROS discussed and pertinent positives are indicated in the HPI above.  All other systems are negative.  Review of Systems  Constitutional: Negative for activity change, fatigue and fever.  Respiratory: Negative for cough and  shortness of breath.   Cardiovascular: Negative for chest pain.  Gastrointestinal: Negative for abdominal pain.  Musculoskeletal: Positive for back pain and gait problem.  Psychiatric/Behavioral: Negative for behavioral problems and confusion.    Vital Signs: BP (!) 157/102   Pulse (!) 108   Temp 97.9 F (36.6 C) (Oral)   Ht 6' (1.829 m)   Wt 234 lb (106.1 kg)   SpO2 96%   BMI 31.74 kg/m   Physical Exam Vitals reviewed.  HENT:     Mouth/Throat:  Mouth: Mucous membranes are moist.  Cardiovascular:     Rate and Rhythm: Normal rate and regular rhythm.     Heart sounds: Normal heart sounds.  Pulmonary:     Effort: Pulmonary effort is normal.     Breath sounds: Normal breath sounds.  Abdominal:     Palpations: Abdomen is soft.  Musculoskeletal:        General: Normal range of motion.  Skin:    General: Skin is warm.  Neurological:     Mental Status: He is alert and oriented to person, place, and time.  Psychiatric:        Behavior: Behavior normal.     Imaging: DG Chest 2 View  Result Date: 12/03/2020 CLINICAL DATA:  Shortness of breath EXAM: CHEST - 2 VIEW COMPARISON:  May 24, 2014 FINDINGS: The heart size is borderline. The hila and mediastinum are normal. No pneumothorax. No nodules or masses. No focal infiltrates. IMPRESSION: No active cardiopulmonary disease. Electronically Signed   By: Dorise Bullion III M.D   On: 12/03/2020 18:35   US RENAL  Result Date: 11/26/2020 CLINICAL DATA:  Acute renal failure, hematuria EXAM: RENAL / URINARY TRACT ULTRASOUND COMPLETE COMPARISON:  07/23/2019 FINDINGS: Right Kidney: Renal measurements: 11.0 x 5.7 x 5.8 cm. = volume: 185 mL. 2.5 cm cyst is noted in the lower pole of the right kidney. No mass lesion or hydronephrosis is noted. Left Kidney: Renal measurements: 12.0 x 6.6 x 5.4 cm. = volume: 235 mL. Echogenicity within normal limits. No mass or hydronephrosis visualized. Bladder: Appears normal for degree of bladder  distention. Other: None. IMPRESSION: Stable right renal cyst. No other focal abnormality is noted. Electronically Signed   By: Inez Catalina M.D.   On: 11/26/2020 21:04    Labs:  CBC: No results for input(s): WBC, HGB, HCT, PLT in the last 8760 hours.  COAGS: No results for input(s): INR, APTT in the last 8760 hours.  BMP: No results for input(s): NA, K, CL, CO2, GLUCOSE, BUN, CALCIUM, CREATININE, GFRNONAA, GFRAA in the last 8760 hours.  Invalid input(s): CMP  LIVER FUNCTION TESTS: No results for input(s): BILITOT, AST, ALT, ALKPHOS, PROT, ALBUMIN in the last 8760 hours.  TUMOR MARKERS: No results for input(s): AFPTM, CEA, CA199, CHROMGRNA in the last 8760 hours.  Assessment and Plan:  New proteinuria; hematuria; acute renal failure Referred to Nephrology Dr Theador Hawthorne Request made for random renal bx Risks and benefits of random renal bx was discussed with the patient and/or patient's family including, but not limited to bleeding, infection, damage to adjacent structures or low yield requiring additional tests.  All of the questions were answered and there is agreement to proceed. Consent signed and in chart.  Pt with high BP--- discussing with Rad about plan  Thank you for this interesting consult.  I greatly enjoyed meeting Charles [name] and look forward to participating in their care.  A copy of this report was sent to the requesting provider on this date.  Electronically Signed: Lavonia Drafts, PA-C 12/21/2020, 7:04 AM   I spent a total of  30 Minutes   in face to face in clinical consultation, greater than 50% of which was counseling/coordinating care for random renal bx

## 2020-12-22 NOTE — Progress Notes (Signed)
Subjective: Charles [name] presents today for follow up of preventative diabetic foot care, painful mycotic nails b/l that are difficult to trim. Pain interferes with ambulation. Aggravating factors include wearing enclosed shoe gear. Pain is relieved with periodic professional debridement and corn(s) left 2nd toe and callus(es) b/l feet and painful mycotic nails b/l.  Pain interferes with ambulation. Aggravating factors include wearing enclosed shoe gear.   Today, he states he still has not gotten his diabetic shoes.  No Known Allergies   Objective: There were no vitals filed for this visit.  Pt is a 65 y.o. [name], obese in NAD. AAO x 3.  Vascular Examination:  Capillary refill time to digits immediate b/l. Faintly palpable pedal pulses b/l. Pedal hair absent. Lower extremity skin temperature gradient within normal limits. No pain with calf compression b/l.  Dermatological Examination: Pedal skin with normal turgor, texture and tone bilaterally. No open wounds bilaterally. No interdigital macerations bilaterally. Toenails 1-5 b/l elongated, discolored, dystrophic, thickened, crumbly with subungual debris and tenderness to dorsal palpation. Hyperkeratotic lesion(s) submet head 1 left foot. No erythema, no edema, no drainage, no flocculence.  Musculoskeletal: Normal muscle strength 5/5 to all lower extremity muscle groups bilaterally, no pain crepitus or joint limitation noted with ROM b/l, bunion deformity noted b/l and hammertoes noted to the  L 2nd toe.  Neurological: Protective sensation diminished with 10g monofilament b/l. Vibratory sensation absent b/l.  Assessment: 1. Pain due to onychomycosis of toenails of both feet   2. Hallux valgus, acquired, bilateral   3. Acquired hammertoes of both feet   4. Diabetic peripheral neuropathy associated with type 2 diabetes mellitus (Charles [name])    Plan: -Continue diabetic foot care principles. Literature dispensed on today.  -Medicaid ABN on file.  Patient declines callus paring on today's visit. Copy of ABN given to patient and placed in chart. -Toenails 1-5 b/l were debrided in length and girth with sterile nail nippers and dremel without iatrogenic bleeding.  -As courtesy, callus pared left foot. -Many DME providers are no longer providing diabetic shoes under Medicaid. Will have to research vendors who provide diabetic shoes. Will contact him with information.  -Patient to report any pedal injuries to medical professional immediately. -Patient/POA to call should there be question/concern in the interim.  Return in about 3 months (around 03/16/2021).

## 2020-12-29 ENCOUNTER — Encounter (HOSPITAL_COMMUNITY): Payer: Self-pay | Admitting: Nephrology

## 2020-12-29 LAB — SURGICAL PATHOLOGY

## 2021-01-19 ENCOUNTER — Other Ambulatory Visit (HOSPITAL_COMMUNITY)
Admission: RE | Admit: 2021-01-19 | Discharge: 2021-01-19 | Disposition: A | Discharge status: Home / Self Care | Payer: Medicaid Other | Source: Ambulatory Visit | Attending: Nephrology | Admitting: Nephrology

## 2021-01-19 ENCOUNTER — Other Ambulatory Visit: Payer: Self-pay

## 2021-01-19 DIAGNOSIS — E1122 Type 2 diabetes mellitus with diabetic chronic kidney disease: Secondary | ICD-10-CM | POA: Diagnosis not present

## 2021-01-19 LAB — CBC
HCT: 34.4 % — ABNORMAL LOW (ref 39.0–52.0)
Hemoglobin: 10.5 g/dL — ABNORMAL LOW (ref 13.0–17.0)
MCH: 29.1 pg (ref 26.0–34.0)
MCHC: 30.5 g/dL (ref 30.0–36.0)
MCV: 95.3 fL (ref 80.0–100.0)
Platelets: 199 10*3/uL (ref 150–400)
RBC: 3.61 MIL/uL — ABNORMAL LOW (ref 4.22–5.81)
RDW: 13.2 % (ref 11.5–15.5)
WBC: 5.2 10*3/uL (ref 4.0–10.5)
nRBC: 0 % (ref 0.0–0.2)

## 2021-01-19 LAB — RENAL FUNCTION PANEL
Albumin: 3.9 g/dL (ref 3.5–5.0)
Anion gap: 7 (ref 5–15)
BUN: 37 mg/dL — ABNORMAL HIGH (ref 8–23)
CO2: 23 mmol/L (ref 22–32)
Calcium: 9.1 mg/dL (ref 8.9–10.3)
Chloride: 112 mmol/L — ABNORMAL HIGH (ref 98–111)
Creatinine, Ser: 3.19 mg/dL — ABNORMAL HIGH (ref 0.61–1.24)
GFR, Estimated: 21 mL/min — ABNORMAL LOW (ref >60)
Glucose, Bld: 96 mg/dL (ref 70–99)
Phosphorus: 3.9 mg/dL (ref 2.5–4.6)
Potassium: 4.7 mmol/L (ref 3.5–5.1)
Sodium: 142 mmol/L (ref 135–145)

## 2021-01-19 LAB — PROTEIN / CREATININE RATIO, URINE
Creatinine, Urine: 74.69 mg/dL
Protein Creatinine Ratio: 2.44 mg/mg{Cre} — ABNORMAL HIGH (ref 0.00–0.15)
Total Protein, Urine: 182 mg/dL

## 2021-02-07 ENCOUNTER — Encounter (INDEPENDENT_AMBULATORY_CARE_PROVIDER_SITE_OTHER): Payer: Self-pay | Admitting: *Deleted

## 2021-03-18 ENCOUNTER — Other Ambulatory Visit (HOSPITAL_COMMUNITY): Payer: Self-pay | Admitting: Nephrology

## 2021-03-18 ENCOUNTER — Other Ambulatory Visit: Payer: Self-pay

## 2021-03-18 ENCOUNTER — Ambulatory Visit (HOSPITAL_COMMUNITY)
Admission: RE | Admit: 2021-03-18 | Discharge: 2021-03-18 | Disposition: A | Discharge status: Home / Self Care | Payer: Medicaid Other | Source: Ambulatory Visit | Attending: Nephrology | Admitting: Nephrology

## 2021-03-18 DIAGNOSIS — I129 Hypertensive chronic kidney disease with stage 1 through stage 4 chronic kidney disease, or unspecified chronic kidney disease: Secondary | ICD-10-CM

## 2021-03-18 DIAGNOSIS — N170 Acute kidney failure with tubular necrosis: Secondary | ICD-10-CM

## 2021-03-30 ENCOUNTER — Encounter: Payer: Self-pay | Admitting: Podiatry

## 2021-03-30 ENCOUNTER — Other Ambulatory Visit: Payer: Self-pay

## 2021-03-30 ENCOUNTER — Ambulatory Visit: Payer: Medicaid Other | Admitting: Podiatry

## 2021-03-30 DIAGNOSIS — B351 Tinea unguium: Secondary | ICD-10-CM | POA: Diagnosis not present

## 2021-03-30 DIAGNOSIS — M79675 Pain in left toe(s): Secondary | ICD-10-CM | POA: Diagnosis not present

## 2021-03-30 DIAGNOSIS — E1142 Type 2 diabetes mellitus with diabetic polyneuropathy: Secondary | ICD-10-CM

## 2021-03-30 DIAGNOSIS — M79674 Pain in right toe(s): Secondary | ICD-10-CM | POA: Diagnosis not present

## 2021-03-30 DIAGNOSIS — E119 Type 2 diabetes mellitus without complications: Secondary | ICD-10-CM
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
## 2021-04-06 ENCOUNTER — Other Ambulatory Visit: Payer: Self-pay

## 2021-04-06 ENCOUNTER — Other Ambulatory Visit (HOSPITAL_COMMUNITY)
Admission: RE | Admit: 2021-04-06 | Discharge: 2021-04-06 | Disposition: A | Discharge status: Home / Self Care | Payer: Medicaid Other | Source: Ambulatory Visit | Attending: Nephrology | Admitting: Nephrology

## 2021-04-06 DIAGNOSIS — E211 Secondary hyperparathyroidism, not elsewhere classified: Secondary | ICD-10-CM | POA: Diagnosis present

## 2021-04-06 DIAGNOSIS — E875 Hyperkalemia: Secondary | ICD-10-CM | POA: Diagnosis present

## 2021-04-06 DIAGNOSIS — N170 Acute kidney failure with tubular necrosis: Secondary | ICD-10-CM | POA: Insufficient documentation

## 2021-04-06 DIAGNOSIS — E559 Vitamin D deficiency, unspecified: Secondary | ICD-10-CM | POA: Insufficient documentation

## 2021-04-06 DIAGNOSIS — N041 Nephrotic syndrome with focal and segmental glomerular lesions: Secondary | ICD-10-CM | POA: Diagnosis present

## 2021-04-06 DIAGNOSIS — E1122 Type 2 diabetes mellitus with diabetic chronic kidney disease: Secondary | ICD-10-CM | POA: Insufficient documentation

## 2021-04-06 DIAGNOSIS — R808 Other proteinuria: Secondary | ICD-10-CM | POA: Diagnosis present

## 2021-04-06 DIAGNOSIS — D638 Anemia in other chronic diseases classified elsewhere: Secondary | ICD-10-CM | POA: Diagnosis present

## 2021-04-06 DIAGNOSIS — N189 Chronic kidney disease, unspecified: Secondary | ICD-10-CM | POA: Diagnosis present

## 2021-04-06 DIAGNOSIS — I129 Hypertensive chronic kidney disease with stage 1 through stage 4 chronic kidney disease, or unspecified chronic kidney disease: Secondary | ICD-10-CM | POA: Diagnosis present

## 2021-04-06 LAB — CBC
HCT: 34.5 % — ABNORMAL LOW (ref 39.0–52.0)
Hemoglobin: 10.9 g/dL — ABNORMAL LOW (ref 13.0–17.0)
MCH: 28.9 pg (ref 26.0–34.0)
MCHC: 31.6 g/dL (ref 30.0–36.0)
MCV: 91.5 fL (ref 80.0–100.0)
Platelets: 218 10*3/uL (ref 150–400)
RBC: 3.77 MIL/uL — ABNORMAL LOW (ref 4.22–5.81)
RDW: 13.6 % (ref 11.5–15.5)
WBC: 5.8 10*3/uL (ref 4.0–10.5)
nRBC: 0 % (ref 0.0–0.2)

## 2021-04-06 LAB — HEPATITIS B SURFACE ANTIGEN: Hepatitis B Surface Ag: NONREACTIVE

## 2021-04-06 LAB — RENAL FUNCTION PANEL
Albumin: 4 g/dL (ref 3.5–5.0)
Anion gap: 7 (ref 5–15)
BUN: 30 mg/dL — ABNORMAL HIGH (ref 8–23)
CO2: 24 mmol/L (ref 22–32)
Calcium: 9.3 mg/dL (ref 8.9–10.3)
Chloride: 108 mmol/L (ref 98–111)
Creatinine, Ser: 3.5 mg/dL — ABNORMAL HIGH (ref 0.61–1.24)
GFR, Estimated: 19 mL/min — ABNORMAL LOW (ref >60)
Glucose, Bld: 101 mg/dL — ABNORMAL HIGH (ref 70–99)
Phosphorus: 4.1 mg/dL (ref 2.5–4.6)
Potassium: 4.4 mmol/L (ref 3.5–5.1)
Sodium: 139 mmol/L (ref 135–145)

## 2021-04-06 LAB — IRON AND TIBC
Iron: 66 ug/dL (ref 45–182)
Saturation Ratios: 19 % (ref 17.9–39.5)
TIBC: 347 ug/dL (ref 250–450)
UIBC: 281 ug/dL

## 2021-04-06 LAB — PROTEIN / CREATININE RATIO, URINE
Creatinine, Urine: 100.8 mg/dL
Protein Creatinine Ratio: 1.29 mg/mg{Cre} — ABNORMAL HIGH (ref 0.00–0.15)
Total Protein, Urine: 130 mg/dL

## 2021-04-06 LAB — FERRITIN: Ferritin: 175 ng/mL (ref 24–336)

## 2021-04-06 LAB — VITAMIN D 25 HYDROXY (VIT D DEFICIENCY, FRACTURES): Vit D, 25-Hydroxy: 55.17 ng/mL (ref 30–100)

## 2021-04-07 LAB — HEPATITIS B SURFACE ANTIBODY, QUANTITATIVE: Hep B S AB Quant (Post): 57.3 m[IU]/mL (ref >9.9)

## 2021-04-07 LAB — HEPATITIS B CORE ANTIBODY, IGM: Hep B C IgM: NONREACTIVE

## 2021-04-07 LAB — PARATHYROID HORMONE, INTACT (NO CA): PTH: 165 pg/mL — ABNORMAL HIGH (ref 15–65)

## 2021-04-09 LAB — QUANTIFERON-TB GOLD PLUS (RQFGPL)
QuantiFERON Mitogen Value: >10 IU/mL
QuantiFERON Nil Value: 0.03 IU/mL
QuantiFERON TB1 Ag Value: 0.02 IU/mL
QuantiFERON TB2 Ag Value: 0.03 IU/mL

## 2021-04-09 LAB — QUANTIFERON-TB GOLD PLUS: QuantiFERON-TB Gold Plus: NEGATIVE

## 2021-04-10 LAB — HEPATITIS C GENOTYPE

## 2021-06-02 ENCOUNTER — Other Ambulatory Visit (INDEPENDENT_AMBULATORY_CARE_PROVIDER_SITE_OTHER): Payer: Self-pay

## 2021-06-02 ENCOUNTER — Encounter (INDEPENDENT_AMBULATORY_CARE_PROVIDER_SITE_OTHER): Payer: Self-pay

## 2021-06-02 ENCOUNTER — Ambulatory Visit (INDEPENDENT_AMBULATORY_CARE_PROVIDER_SITE_OTHER): Payer: Medicaid Other | Admitting: Gastroenterology

## 2021-06-02 ENCOUNTER — Telehealth (INDEPENDENT_AMBULATORY_CARE_PROVIDER_SITE_OTHER): Payer: Self-pay

## 2021-06-02 ENCOUNTER — Encounter (INDEPENDENT_AMBULATORY_CARE_PROVIDER_SITE_OTHER): Payer: Self-pay | Admitting: Gastroenterology

## 2021-06-02 ENCOUNTER — Other Ambulatory Visit: Payer: Self-pay

## 2021-06-02 VITALS — BP 184/97 | HR 76 | Temp 97.8°F | Ht 72.0 in | Wt 214.4 lb

## 2021-06-02 DIAGNOSIS — Z86010 Personal history of colonic polyps: Secondary | ICD-10-CM

## 2021-06-02 DIAGNOSIS — D649 Anemia, unspecified: Secondary | ICD-10-CM

## 2021-06-02 MED ORDER — PEG 3350-KCL-NA BICARB-NACL 420 G PO SOLR: 4000.0000 mL | ORAL | 0 refills | Status: DC

## 2021-06-02 NOTE — Patient Instructions (Signed)
Schedule EGD and colonoscopy in first week of October Continue oral iron

## 2021-06-02 NOTE — Progress Notes (Signed)
Charles Charles [name], M.D. Gastroenterology & Hepatology [facility] [address] [place], [place] [address] Primary Care Physician: Charles Gaskins, MD [address] [place] [address]  Referring MD: Charles Bold, MD, PCP  Chief Complaint: Anemia  History of Present Illness: Charles Charles [name] is a 65 y.o. male with past medical history of dementia, diabetes, hypertension, hepatitis C status post Solvadi and ribavirin,  schizophrenia and CKD who presents for evaluation of anemia.  Patient was referred to our clinic by his nephrologist for evaluation of anemia.  Most recent lab results from 05/24/2021 showed a creatinine of 3.13, BUN of 28, potassium 5.0, sodium 140, chloride 111, albumin 3.7, hemoglobin of 11.5, hematocrit of 34%, platelets 312.  His most recent iron studies from 03/17/2021 showed a ferritin of 147, TIBC of 320 and iron saturation of 19%.  His iron was 62.  He has been taking iron for iron deficiency in the past and was referred to our clinic for further evaluation of anemia.  Patient denies having any complaints. Only Charles [name] his face has been numbness in his face and food does not taste at all, he was referred to a neurologist for further evaluation of this. The patient denies having any nausea, vomiting, fever, chills, hematochezia, melena, hematemesis, abdominal distention, abdominal pain, diarrhea, jaundice, pruritus or weight loss.  He Charles [name] he has been compliant to his oral iron supplementation, takes it once a day.  He has been taking meloxicam 7.5 mg twice a day.  The patient reports he had hepatitis C "at one time". He was treated for it close to 2-3 years ago - received Solvadi and ribavirin for 12 weeks. Last viral load was negative in 2019 - 2 years after treatment, he achieved SVR.  Last ACZ:YSAYT Last Colonoscopy:2019 - Three 7 to 15 mm polyps in the cecum, removed with a hot snare. Complete  resection. Partial retrieval. - Two 8 to 10 mm polyps at the splenic flexure and in the transverse colon, removed with a hot snare. Resected and retrieved. - One small polyp in the transverse colon, removed with a cold snare. Resected and retrieved.  Patient asked to repeat in 3 years.  FHx: neg for any gastrointestinal/liver disease, no malignancie Social: smokes 1 pack every 3 days, stopped drinking alcohol in early 0160, neg illicit drug use  Past Medical History: Past Medical History:  Diagnosis Date   Anemia    Anxiety    Chronic back pain    Dementia    Nursing facility feels he has Dementia   Diabetes mellitus    Fracture of right foot    Hypertension    Mental disorder    schizophrenia    Past Surgical History: Past Surgical History:  Procedure Laterality Date   COLONOSCOPY N/A 04/11/2018   Procedure: COLONOSCOPY;  Surgeon: Charles Houston, MD;  [facility];  Service: Endoscopy;  Laterality: N/A;  10:30   COLONOSCOPY WITH PROPOFOL N/A 07/05/2018   Procedure: COLONOSCOPY WITH PROPOFOL;  Surgeon: Charles Houston, MD;  [facility];  Service: Endoscopy;  Laterality: N/A;  10:35   left 1st toe     ORIF TOE FRACTURE  09/05/2011   Procedure: OPEN REDUCTION INTERNAL FIXATION (ORIF) METATARSAL (TOE) FRACTURE;  Surgeon: Charles Charles [name];  Location: [place];  Service: Orthopedics;  Laterality: Left;  reconstruction left great toe FHB plantar plate   POLYPECTOMY  04/11/2018   Procedure: POLYPECTOMY;  Surgeon: Charles Houston,  MD;  [facility];  Service: Endoscopy;;  colon   POLYPECTOMY  07/05/2018   Procedure: POLYPECTOMY;  Surgeon: Charles Houston, MD;  [facility];  Service: Endoscopy;;  colon   right foot suger      Family History: Family History  Problem Relation Age of Onset   Colon cancer Neg Hx    Liver disease Neg Hx        unknown for sure, mom died at age 7    Social History: Social History    Tobacco Use  Smoking Status Every Day   Packs/day: 0.25   Years: 40.00   Pack years: 10.00   Types: Cigarettes  Smokeless Tobacco Never   Social History   Substance and Sexual Activity  Alcohol Use Yes   Comment: 16-24 oz occasionally   Social History   Substance and Sexual Activity  Drug Use No    Allergies: No Known Allergies  Medications: Current Outpatient Medications  Medication Sig Dispense Refill   acetaminophen (TYLENOL 8 HOUR) 650 MG CR tablet Take 1 tablet (650 mg total) by mouth every 8 (eight) hours as needed for pain or fever. 30 tablet 0   albuterol (VENTOLIN HFA) 108 (90 Base) MCG/ACT inhaler Inhale 2 puffs into the lungs 4 (four) times daily.     amLODipine (NORVASC) 10 MG tablet Take 10 mg by mouth daily.     aspirin EC 81 MG tablet Take 1 tablet (81 mg total) by mouth daily with breakfast.     benzonatate (TESSALON) 100 MG capsule Take 100 mg by mouth 3 (three) times daily.     calcitRIOL (ROCALTROL) 0.25 MCG capsule Take 0.25 mcg by mouth 3 (three) times a week.     cyclobenzaprine (FLEXERIL) 10 MG tablet Take 10 mg by mouth daily.     ferrous sulfate 325 (65 FE) MG tablet Take 325 mg by mouth daily with breakfast.     furosemide (LASIX) 40 MG tablet Take 40 mg by mouth daily with breakfast.     gabapentin (NEURONTIN) 300 MG capsule Take 600 mg by mouth 3 (three) times daily.     gabapentin (NEURONTIN) 600 MG tablet Take 600 mg by mouth 3 (three) times daily.      insulin detemir (LEVEMIR) 100 UNIT/ML injection Inject 50 Units into the skin daily.     insulin lispro (HUMALOG) 100 UNIT/ML injection Inject 5 Units into the skin 3 (three) times daily before meals.     labetalol (NORMODYNE) 200 MG tablet Take 200 mg by mouth 2 (two) times daily.     lisinopril (PRINIVIL,ZESTRIL) 10 MG tablet Take 10 mg by mouth daily.     potassium chloride (K-DUR,KLOR-CON) 10 MEQ tablet Take 20 mEq by mouth daily.     pravastatin (PRAVACHOL) 40 MG tablet Take 40 mg by  mouth daily.      predniSONE (STERAPRED UNI-PAK 21 TAB) 10 MG (21) TBPK tablet Take by mouth daily. Take 6 tabs by mouth daily  for 2 days, then 5 tabs for 2 days, then 4 tabs for 2 days, then 3 tabs for 2 days, 2 tabs for 2 days, then 1 tab by mouth daily for 2 days 42 tablet 0   sodium bicarbonate 650 MG tablet Take 650 mg by mouth 3 (three) times daily.     tamsulosin (FLOMAX) 0.4 MG CAPS capsule Take 0.4 mg by mouth.     No current facility-administered medications for this visit.    Review of Systems:  GENERAL: negative for malaise, night sweats HEENT: No changes in hearing or vision, no nose bleeds or other nasal problems. NECK: Negative for lumps, goiter, pain and significant neck swelling RESPIRATORY: Negative for cough, wheezing CARDIOVASCULAR: Negative for chest pain, leg swelling, palpitations, orthopnea GI: SEE HPI MUSCULOSKELETAL: Negative for joint pain or swelling, back pain, and muscle pain. SKIN: Negative for lesions, rash PSYCH: Negative for sleep disturbance, mood disorder and recent psychosocial stressors. HEMATOLOGY Negative for prolonged bleeding, bruising easily, and swollen nodes. ENDOCRINE: Negative for cold or heat intolerance, polyuria, polydipsia and goiter. NEURO: negative for tremor, gait imbalance, syncope and seizures. The remainder of the review of systems is noncontributory.   Physical Exam: BP (!) 184/97 (BP Location: Left Arm, Patient Position: Sitting, Cuff Size: Large)   Pulse 76   Temp 97.8 F (36.6 C) (Oral)   Ht 6' (1.829 m)   Wt 214 lb 6.4 oz (97.3 kg)   BMI 29.08 kg/m  GENERAL: The patient is AO x3, in no acute distress. HEENT: Head is normocephalic and atraumatic. EOMI are intact. Mouth is well hydrated and without lesions. NECK: Supple. No masses LUNGS: Clear to auscultation. No presence of rhonchi/wheezing/rales. Adequate chest expansion HEART: RRR, normal s1 and s2. ABDOMEN: Soft, nontender, no guarding, no peritoneal signs, and  nondistended. BS +. No masses. EXTREMITIES: Without any cyanosis, clubbing, rash, lesions or edema. NEUROLOGIC: AOx3, no focal motor deficit. SKIN: no jaundice, no rashes   Imaging/Labs: as above  I personally reviewed and interpreted the available labs, imaging and endoscopic files.  Impression and Plan: Charles Charles [name] is a 65 y.o. male with past medical history of dementia, diabetes, hypertension, hepatitis C status post Solvadi and ribavirin,  schizophrenia and CKD who presents for evaluation of anemia.  The patient has been asymptomatic but had presence of mild anemia in previous blood work-up.  He has not been symptomatic and has not presented any overt gastrointestinal bleeding.  Based on his iron indexes, it is possible that his anemia is due to chronic kidney disease which has been worsening recently.  However, it is unclear if his iron stores have been repleted with oral iron intake.  I encouraged him to continue taking his oral iron but we will investigate further his blood work-up alterations with an EGD and a colonoscopy.  In fact he is due for surveillance colonoscopy in October given his history of colonic polyps.  - Schedule EGD and colonoscopy in first week of October - Continue oral iron  All questions were answered.      Charles Peppers, MD Gastroenterology and Hepatology [facility]

## 2021-06-02 NOTE — Telephone Encounter (Signed)
LeighAnn Jaeda Bruso, CMA  

## 2021-06-23 ENCOUNTER — Encounter (HOSPITAL_COMMUNITY): Payer: Self-pay | Admitting: *Deleted

## 2021-06-23 ENCOUNTER — Emergency Department (HOSPITAL_COMMUNITY): Payer: Medicaid Other

## 2021-06-23 ENCOUNTER — Other Ambulatory Visit: Payer: Self-pay

## 2021-06-23 ENCOUNTER — Emergency Department (HOSPITAL_COMMUNITY)
Admission: EM | Admit: 2021-06-23 | Discharge: 2021-06-23 | Disposition: A | Discharge status: Home / Self Care | Payer: Medicaid Other | Attending: Emergency Medicine | Admitting: Emergency Medicine

## 2021-06-23 DIAGNOSIS — M25532 Pain in left wrist: Secondary | ICD-10-CM | POA: Insufficient documentation

## 2021-06-23 DIAGNOSIS — M7918 Myalgia, other site: Secondary | ICD-10-CM

## 2021-06-23 DIAGNOSIS — M542 Cervicalgia: Secondary | ICD-10-CM | POA: Diagnosis not present

## 2021-06-23 DIAGNOSIS — E119 Type 2 diabetes mellitus without complications: Secondary | ICD-10-CM | POA: Diagnosis not present

## 2021-06-23 DIAGNOSIS — F0390 Unspecified dementia without behavioral disturbance: Secondary | ICD-10-CM | POA: Diagnosis not present

## 2021-06-23 DIAGNOSIS — I10 Essential (primary) hypertension: Secondary | ICD-10-CM | POA: Diagnosis not present

## 2021-06-23 DIAGNOSIS — Z7982 Long term (current) use of aspirin: Secondary | ICD-10-CM | POA: Diagnosis not present

## 2021-06-23 DIAGNOSIS — Y92410 Unspecified street and highway as the place of occurrence of the external cause: Secondary | ICD-10-CM | POA: Insufficient documentation

## 2021-06-23 DIAGNOSIS — F17210 Nicotine dependence, cigarettes, uncomplicated: Secondary | ICD-10-CM | POA: Diagnosis not present

## 2021-06-23 DIAGNOSIS — Z79899 Other long term (current) drug therapy: Secondary | ICD-10-CM | POA: Insufficient documentation
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
## 2021-06-23 NOTE — Discharge Instructions (Addendum)
You may alternate taking Tylenol as needed for pain control. You may take 500-1000 mg of Tylenol every 6 hours. Do not exceed 4000 mg of Tylenol daily as this can lead to liver damage. You may use warm and cold compresses to help with your symptoms.   Please follow up with your primary care provider within 5-7 days for re-evaluation of your symptoms. If you do not have a primary care provider, information for a healthcare clinic has been provided for you to make arrangements for follow up care. Please return to the emergency department for any new or worsening symptoms.  

## 2021-07-11 ENCOUNTER — Encounter: Payer: Self-pay | Admitting: Podiatry

## 2021-07-11 ENCOUNTER — Ambulatory Visit: Payer: Medicaid Other | Admitting: Podiatry

## 2021-07-11 ENCOUNTER — Other Ambulatory Visit: Payer: Self-pay

## 2021-07-11 DIAGNOSIS — M79675 Pain in left toe(s): Secondary | ICD-10-CM

## 2021-07-11 DIAGNOSIS — L84 Corns and callosities: Secondary | ICD-10-CM | POA: Diagnosis not present

## 2021-07-11 DIAGNOSIS — M79674 Pain in right toe(s): Secondary | ICD-10-CM | POA: Diagnosis not present

## 2021-07-11 DIAGNOSIS — B351 Tinea unguium: Secondary | ICD-10-CM | POA: Diagnosis not present

## 2021-07-11 DIAGNOSIS — E1142 Type 2 diabetes mellitus with diabetic polyneuropathy: Secondary | ICD-10-CM | POA: Diagnosis not present

## 2021-07-11 NOTE — Patient Instructions (Signed)
Recommend Skechers Loafers with stretchable uppers and memory foam insoles. They can be purchased at [employer]. 

## 2021-07-14 NOTE — Progress Notes (Signed)
  Subjective:  Patient ID: Charles [name], male    DOB: April 22, 1956,  MRN: [id number]  65 y.o. male presents with at risk foot care with history of diabetic neuropathy and corn(s) right 2nd toe, left 5th toe , callus(es) b/l feet and painful mycotic nails.  Pain interferes with ambulation. Aggravating factors include wearing enclosed shoe gear. Painful toenails interfere with ambulation. Aggravating factors include wearing enclosed shoe gear. Pain is relieved with periodic professional debridement. Painful corns and calluses are aggravated when weightbearing with and without shoegear. Pain is relieved with periodic professional debridement.  Patient did not check blood glucose this morning.  PCP: Lucia Gaskins, MD.  Patient states he has not seen his PCP.  Review of Systems: Negative except as noted in the HPI.   No Known Allergies  Objective:  There were no vitals filed for this visit. Constitutional Patient is a pleasant 65 y.o. African American male in NAD. AAO x 3.  Vascular Capillary fill time to digits <3 seconds b/l.  DP/PT pulse(s) are faintly palpable b/l lower extremities. Pedal hair absent b/l. Lower extremity skin temperature gradient within normal limits. No pain with calf compression b/l. No edema noted b/l lower extremities. No cyanosis or clubbing noted.  Neurologic Protective sensation intact 5/5 decreased bilaterally with 10g monofilament b/l. Vibratory sensation intact b/l. No clonus b/l.  Dermatologic Pedal skin is warm and supple b/l.  No open wounds b/l lower extremities. No interdigital macerations b/l lower extremities. Toenails 1-5 b/l elongated, discolored, dystrophic, thickened, crumbly with subungual debris and tenderness to dorsal palpation. Hyperkeratotic lesion(s) L 5th toe, R 2nd toe, and submet head 1 left foot.  No erythema, no edema, no drainage, no fluctuance.  Orthopedic: Normal muscle strength 5/5 to all lower extremity muscle groups bilaterally. Patient  ambulates independent of any assistive aids. Hallux valgus with bunion deformity noted b/l lower extremities. Hammertoe(s) noted to the 2-5 bilaterally.    Assessment:   1. Pain due to onychomycosis of toenails of both feet   2. Corns and callosities   3. Diabetic peripheral neuropathy associated with type 2 diabetes mellitus ([place])    Plan:  Patient was evaluated and treated and all questions answered. Consent given for treatment as described below: -Examined patient. -Medicaid ABN signed for this year. Patient consents for services of paring of corns/calluses  today. Copy has been placed in patient chart. -Continue diabetic foot care principles: inspect feet daily, monitor glucose as recommended by PCP and/or Endocrinologist, and follow prescribed diet per PCP, Endocrinologist and/or dietician. -Patient to continue soft, supportive shoe gear daily. -Toenails 1-5 b/l were debrided in length and girth with sterile nail nippers and dremel without iatrogenic bleeding.  -Patient to report any pedal injuries to medical professional immediately. -Recommended Skechers shoes with stretchable uppers and memory foam insoles. -Dispensed Silipos digital toe caps for left 5th digit and right 2nd digit. Apply every morning. Remove every evening. -Patient/POA to call should there be question/concern in the interim.  Return in about 3 months (around 10/10/2021).  Marzetta Board, DPM

## 2021-07-27 NOTE — Patient Instructions (Signed)
Charles [name]  07/27/2021     @PREFPERIOPPHARMACY @   Your procedure is scheduled on  08/02/2021.   Report to [facility] at  0730  A.M.   Call this number if you have problems the morning of surgery:  [phone]   Remember:  Follow the diet and prep instructions given to you by the office.    Take these medicines the morning of surgery with A SIP OF WATER       amlodipine, gabapentin, labetolol, mobic, procardia, flomax.     Do not wear jewelry, make-up or nail polish.  Do not wear lotions, powders, or perfumes, or deodorant.  Do not shave 48 hours prior to surgery.  Men may shave face and neck.  Do not bring valuables to the hospital.  [facility] is not responsible for any belongings or valuables.  Contacts, dentures or bridgework may not be worn into surgery.  Leave your suitcase in the car.  After surgery it may be brought to your room.  For patients admitted to the hospital, discharge time will be determined by your treatment team.  Patients discharged the day of surgery will not be allowed to drive home and must have someone with them for 24 hours.    Special instructions:   DO NOT smoke tobacco or vape for 24 hours before your procedure.  Please read over the following fact sheets that you were given. Anesthesia Post-op Instructions and Care and Recovery After Surgery      Upper Endoscopy, Adult, Care After This sheet gives you information about how to care for yourself after your procedure. Your health care provider may also give you more specific instructions. If you have problems or questions, contact your health care provider. What can I expect after the procedure? After the procedure, it is common to have: A sore throat. Mild stomach pain or discomfort. Bloating. Nausea. Follow these instructions at home:  Follow instructions from your health care provider about what to eat or drink after your procedure. Return to your normal activities  as told by your health care provider. Ask your health care provider what activities are safe for you. Take over-the-counter and prescription medicines only as told by your health care provider. If you were given a sedative during the procedure, it can affect you for several hours. Do not drive or operate machinery until your health care provider says that it is safe. Keep all follow-up visits as told by your health care provider. This is important. Contact a health care provider if you have: A sore throat that lasts longer than one day. Trouble swallowing. Get help right away if: You vomit blood or your vomit looks like coffee grounds. You have: A fever. Bloody, black, or tarry stools. A severe sore throat or you cannot swallow. Difficulty breathing. Severe pain in your chest or abdomen. Summary After the procedure, it is common to have a sore throat, mild stomach discomfort, bloating, and nausea. If you were given a sedative during the procedure, it can affect you for several hours. Do not drive or operate machinery until your health care provider says that it is safe. Follow instructions from your health care provider about what to eat or drink after your procedure. Return to your normal activities as told by your health care provider. This information is not intended to replace advice given to you by your health care provider. Make sure you discuss any questions you have with your  health care provider. Document Revised: 10/14/2019 Document Reviewed: 03/18/2018 Elsevier Patient Education  2022 [place]. Colonoscopy, Adult, Care After This sheet gives you information about how to care for yourself after your procedure. Your health care provider may also give you more specific instructions. If you have problems or questions, contact your health care provider. What can I expect after the procedure? After the procedure, it is common to have: A small amount of blood in your stool for 24  hours after the procedure. Some gas. Mild cramping or bloating of your abdomen. Follow these instructions at home: Eating and drinking  Drink enough fluid to keep your urine pale yellow. Follow instructions from your health care provider about eating or drinking restrictions. Resume your normal diet as instructed by your health care provider. Avoid heavy or fried foods that are hard to digest. Activity Rest as told by your health care provider. Avoid sitting for a long time without moving. Get up to take short walks every 1-2 hours. This is important to improve blood flow and breathing. Ask for help if you feel weak or unsteady. Return to your normal activities as told by your health care provider. Ask your health care provider what activities are safe for you. Managing cramping and bloating  Try walking around when you have cramps or feel bloated. Apply heat to your abdomen as told by your health care provider. Use the heat source that your health care provider recommends, such as a moist heat pack or a heating pad. Place a towel between your skin and the heat source. Leave the heat on for 20-30 minutes. Remove the heat if your skin turns bright red. This is especially important if you are unable to feel pain, heat, or cold. You may have a greater risk of getting burned. General instructions If you were given a sedative during the procedure, it can affect you for several hours. Do not drive or operate machinery until your health care provider says that it is safe. For the first 24 hours after the procedure: Do not sign important documents. Do not drink alcohol. Do your regular daily activities at a slower pace than normal. Eat soft foods that are easy to digest. Take over-the-counter and prescription medicines only as told by your health care provider. Keep all follow-up visits as told by your health care provider. This is important. Contact a health care provider if: You have blood in  your stool 2-3 days after the procedure. Get help right away if you have: More than a small spotting of blood in your stool. Large blood clots in your stool. Swelling of your abdomen. Nausea or vomiting. A fever. Increasing pain in your abdomen that is not relieved with medicine. Summary After the procedure, it is common to have a small amount of blood in your stool. You may also have mild cramping and bloating of your abdomen. If you were given a sedative during the procedure, it can affect you for several hours. Do not drive or operate machinery until your health care provider says that it is safe. Get help right away if you have a lot of blood in your stool, nausea or vomiting, a fever, or increased pain in your abdomen. This information is not intended to replace advice given to you by your health care provider. Make sure you discuss any questions you have with your health care provider. Document Revised: 10/10/2019 Document Reviewed: 05/12/2019 Elsevier Patient Education  [place] After This  sheet gives you information about how to care for yourself after your procedure. Your health care provider may also give you more specific instructions. If you have problems or questions, contact your health care provider. What can I expect after the procedure? After the procedure, it is common to have: Tiredness. Forgetfulness about what happened after the procedure. Impaired judgment for important decisions. Nausea or vomiting. Some difficulty with balance. Follow these instructions at home: For the time period you were told by your health care provider:   Rest as needed. Do not participate in activities where you could fall or become injured. Do not drive or use machinery. Do not drink alcohol. Do not take sleeping pills or medicines that cause drowsiness. Do not make important decisions or sign legal documents. Do not take care of children on  your own. Eating and drinking Follow the diet that is recommended by your health care provider. Drink enough fluid to keep your urine pale yellow. If you vomit: Drink water, juice, or soup when you can drink without vomiting. Make sure you have little or no nausea before eating solid foods. General instructions Have a responsible adult stay with you for the time you are told. It is important to have someone help care for you until you are awake and alert. Take over-the-counter and prescription medicines only as told by your health care provider. If you have sleep apnea, surgery and certain medicines can increase your risk for breathing problems. Follow instructions from your health care provider about wearing your sleep device: Anytime you are sleeping, including during daytime naps. While taking prescription pain medicines, sleeping medicines, or medicines that make you drowsy. Avoid smoking. Keep all follow-up visits as told by your health care provider. This is important. Contact a health care provider if: You keep feeling nauseous or you keep vomiting. You feel light-headed. You are still sleepy or having trouble with balance after 24 hours. You develop a rash. You have a fever. You have redness or swelling around the IV site. Get help right away if: You have trouble breathing. You have new-onset confusion at home. Summary For several hours after your procedure, you may feel tired. You may also be forgetful and have poor judgment. Have a responsible adult stay with you for the time you are told. It is important to have someone help care for you until you are awake and alert. Rest as told. Do not drive or operate machinery. Do not drink alcohol or take sleeping pills. Get help right away if you have trouble breathing, or if you suddenly become confused. This information is not intended to replace advice given to you by your health care provider. Make sure you discuss any questions  you have with your health care provider. Document Revised: 07/01/2020 Document Reviewed: 09/18/2019 Elsevier Patient Education  2022 [employer].

## 2021-07-28 ENCOUNTER — Encounter (HOSPITAL_COMMUNITY)
Admission: RE | Admit: 2021-07-28 | Discharge: 2021-07-28 | Disposition: A | Discharge status: Home / Self Care | Payer: Medicaid Other | Source: Ambulatory Visit | Attending: Gastroenterology | Admitting: Gastroenterology

## 2021-07-28 ENCOUNTER — Encounter: Payer: Self-pay | Admitting: *Deleted

## 2021-07-28 ENCOUNTER — Other Ambulatory Visit: Payer: Self-pay

## 2021-07-28 DIAGNOSIS — Z01818 Encounter for other preprocedural examination: Secondary | ICD-10-CM | POA: Diagnosis present

## 2021-07-28 LAB — CBC WITH DIFFERENTIAL/PLATELET
Abs Immature Granulocytes: 0.03 10*3/uL (ref 0.00–0.07)
Basophils Absolute: 0 10*3/uL (ref 0.0–0.1)
Basophils Relative: 0 %
Eosinophils Absolute: 0.1 10*3/uL (ref 0.0–0.5)
Eosinophils Relative: 2 %
HCT: 39.6 % (ref 39.0–52.0)
Hemoglobin: 12.5 g/dL — ABNORMAL LOW (ref 13.0–17.0)
Immature Granulocytes: 1 %
Lymphocytes Relative: 23 %
Lymphs Abs: 1.2 10*3/uL (ref 0.7–4.0)
MCH: 29.6 pg (ref 26.0–34.0)
MCHC: 31.6 g/dL (ref 30.0–36.0)
MCV: 93.8 fL (ref 80.0–100.0)
Monocytes Absolute: 0.3 10*3/uL (ref 0.1–1.0)
Monocytes Relative: 6 %
Neutro Abs: 3.7 10*3/uL (ref 1.7–7.7)
Neutrophils Relative %: 68 %
Platelets: 216 10*3/uL (ref 150–400)
RBC: 4.22 MIL/uL (ref 4.22–5.81)
RDW: 14.1 % (ref 11.5–15.5)
WBC: 5.4 10*3/uL (ref 4.0–10.5)
nRBC: 0 % (ref 0.0–0.2)

## 2021-07-28 LAB — BASIC METABOLIC PANEL
Anion gap: 8 (ref 5–15)
BUN: 40 mg/dL — ABNORMAL HIGH (ref 8–23)
CO2: 22 mmol/L (ref 22–32)
Calcium: 9.3 mg/dL (ref 8.9–10.3)
Chloride: 110 mmol/L (ref 98–111)
Creatinine, Ser: 2.94 mg/dL — ABNORMAL HIGH (ref 0.61–1.24)
GFR, Estimated: 23 mL/min — ABNORMAL LOW (ref >60)
Glucose, Bld: 91 mg/dL (ref 70–99)
Potassium: 4.1 mmol/L (ref 3.5–5.1)
Sodium: 140 mmol/L (ref 135–145)
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
## 2021-08-02 ENCOUNTER — Encounter: Payer: Self-pay | Admitting: Psychiatry

## 2021-08-02 ENCOUNTER — Ambulatory Visit (HOSPITAL_COMMUNITY)
Admission: RE | Admit: 2021-08-02 | Discharge: 2021-08-02 | Disposition: A | Discharge status: Home / Self Care | Payer: Medicaid Other | Attending: Gastroenterology | Admitting: Gastroenterology

## 2021-08-02 ENCOUNTER — Ambulatory Visit: Payer: Medicaid Other | Admitting: Psychiatry

## 2021-08-02 ENCOUNTER — Ambulatory Visit (HOSPITAL_COMMUNITY): Payer: Medicaid Other | Admitting: Anesthesiology

## 2021-08-02 ENCOUNTER — Encounter (HOSPITAL_COMMUNITY): Payer: Self-pay | Admitting: Gastroenterology

## 2021-08-02 ENCOUNTER — Encounter (HOSPITAL_COMMUNITY)
Admission: RE | Disposition: A | Discharge status: Home / Self Care | Payer: Self-pay | Source: Home / Self Care | Attending: Gastroenterology

## 2021-08-02 DIAGNOSIS — Z86010 Personal history of colonic polyps: Secondary | ICD-10-CM | POA: Insufficient documentation

## 2021-08-02 DIAGNOSIS — D649 Anemia, unspecified: Secondary | ICD-10-CM | POA: Insufficient documentation

## 2021-08-02 DIAGNOSIS — Z791 Long term (current) use of non-steroidal anti-inflammatories (NSAID): Secondary | ICD-10-CM | POA: Insufficient documentation

## 2021-08-02 DIAGNOSIS — D122 Benign neoplasm of ascending colon: Secondary | ICD-10-CM | POA: Insufficient documentation

## 2021-08-02 DIAGNOSIS — E1122 Type 2 diabetes mellitus with diabetic chronic kidney disease: Secondary | ICD-10-CM | POA: Diagnosis not present

## 2021-08-02 DIAGNOSIS — N189 Chronic kidney disease, unspecified: Secondary | ICD-10-CM | POA: Insufficient documentation

## 2021-08-02 DIAGNOSIS — I129 Hypertensive chronic kidney disease with stage 1 through stage 4 chronic kidney disease, or unspecified chronic kidney disease: Secondary | ICD-10-CM | POA: Diagnosis not present

## 2021-08-02 DIAGNOSIS — Z7982 Long term (current) use of aspirin: Secondary | ICD-10-CM | POA: Diagnosis not present

## 2021-08-02 DIAGNOSIS — K3189 Other diseases of stomach and duodenum: Secondary | ICD-10-CM | POA: Insufficient documentation

## 2021-08-02 DIAGNOSIS — Z09 Encounter for follow-up examination after completed treatment for conditions other than malignant neoplasm: Secondary | ICD-10-CM | POA: Diagnosis not present

## 2021-08-02 DIAGNOSIS — K648 Other hemorrhoids: Secondary | ICD-10-CM | POA: Insufficient documentation

## 2021-08-02 DIAGNOSIS — F0390 Unspecified dementia without behavioral disturbance: Secondary | ICD-10-CM | POA: Diagnosis not present

## 2021-08-02 DIAGNOSIS — D123 Benign neoplasm of transverse colon: Secondary | ICD-10-CM | POA: Insufficient documentation

## 2021-08-02 DIAGNOSIS — Z79899 Other long term (current) drug therapy: Secondary | ICD-10-CM | POA: Diagnosis not present

## 2021-08-02 DIAGNOSIS — F17210 Nicotine dependence, cigarettes, uncomplicated: Secondary | ICD-10-CM | POA: Insufficient documentation

## 2021-08-02 DIAGNOSIS — Z1211 Encounter for screening for malignant neoplasm of colon: Secondary | ICD-10-CM

## 2021-08-02 HISTORY — PX: COLONOSCOPY WITH PROPOFOL: SHX5780

## 2021-08-02 HISTORY — PX: ESOPHAGOGASTRODUODENOSCOPY (EGD) WITH PROPOFOL: SHX5813

## 2021-08-02 HISTORY — PX: POLYPECTOMY: SHX5525

## 2021-08-02 HISTORY — PX: BIOPSY: SHX5522

## 2021-08-02 LAB — GLUCOSE, CAPILLARY
Glucose-Capillary: 67 mg/dL — ABNORMAL LOW (ref 70–99)
Glucose-Capillary: 104 mg/dL — ABNORMAL HIGH (ref 70–99)

## 2021-08-02 LAB — HM COLONOSCOPY

## 2021-08-02 SURGERY — COLONOSCOPY WITH PROPOFOL
Anesthesia: General

## 2021-08-02 MED ORDER — GABAPENTIN 300 MG PO CAPS
300.0000 mg | ORAL_CAPSULE | ORAL | Status: DC
  Filled 2021-08-02: qty 1

## 2021-08-02 MED ORDER — DEXTROSE 50 % IV SOLN
25.0000 mL | Freq: Once | INTRAVENOUS | Status: AC
  Administered 2021-08-02: 25 mL via INTRAVENOUS

## 2021-08-02 MED ORDER — DEXTROSE 50 % IV SOLN
INTRAVENOUS | Status: AC
  Filled 2021-08-02: qty 50

## 2021-08-02 MED ORDER — LIDOCAINE HCL (CARDIAC) PF 50 MG/5ML IV SOSY
PREFILLED_SYRINGE | INTRAVENOUS | Status: DC | PRN
  Administered 2021-08-02: 50 mg via INTRAVENOUS

## 2021-08-02 MED ORDER — PROPOFOL 10 MG/ML IV BOLUS
INTRAVENOUS | Status: DC | PRN
  Administered 2021-08-02: 100 mg via INTRAVENOUS
  Administered 2021-08-02: 50 mg via INTRAVENOUS
  Administered 2021-08-02: 100 mg via INTRAVENOUS
  Administered 2021-08-02 (×4): 50 mg via INTRAVENOUS

## 2021-08-02 MED ORDER — LACTATED RINGERS IV SOLN
INTRAVENOUS | Status: DC
  Administered 2021-08-02: 1000 mL via INTRAVENOUS

## 2021-08-02 NOTE — Anesthesia Preprocedure Evaluation (Signed)
Anesthesia Evaluation  Patient identified by MRN, date of birth, ID band Patient awake    Reviewed: Allergy & Precautions, H&P , NPO status , Patient's Chart, lab work & pertinent test results, reviewed documented beta blocker date and time   Airway Mallampati: II  TM Distance: >3 FB Neck ROM: full    Dental no notable dental hx.    Pulmonary neg pulmonary ROS, Current Smoker and Patient abstained from smoking.,    Pulmonary exam normal breath sounds clear to auscultation       Cardiovascular Exercise Tolerance: Good hypertension, negative cardio ROS   Rhythm:regular Rate:Normal     Neuro/Psych PSYCHIATRIC DISORDERS Anxiety Dementia negative neurological ROS     GI/Hepatic negative GI ROS, Neg liver ROS,   Endo/Other  negative endocrine ROSdiabetes, Type 2  Renal/GU negative Renal ROS  negative genitourinary   Musculoskeletal   Abdominal   Peds  Hematology  (+) Blood dyscrasia, anemia ,   Anesthesia Other Findings   Reproductive/Obstetrics negative OB ROS                             Anesthesia Physical Anesthesia Plan  ASA: 3  Anesthesia Plan: General   Post-op Pain Management:    Induction:   PONV Risk Score and Plan: Propofol infusion  Airway Management Planned:   Additional Equipment:   Intra-op Plan:   Post-operative Plan:   Informed Consent: I have reviewed the patients History and Physical, chart, labs and discussed the procedure including the risks, benefits and alternatives for the proposed anesthesia with the patient or authorized representative who has indicated his/her understanding and acceptance.     Dental Advisory Given  Plan Discussed with: CRNA  Anesthesia Plan Comments:         Anesthesia Quick Evaluation

## 2021-08-02 NOTE — Transfer of Care (Signed)
Immediate Anesthesia Transfer of Care Note  Patient: Charles [name]  Procedure(s) Performed: COLONOSCOPY WITH PROPOFOL ESOPHAGOGASTRODUODENOSCOPY (EGD) WITH PROPOFOL POLYPECTOMY BIOPSY  Patient Location: Short Stay  Anesthesia Type:General  Level of Consciousness: awake and patient cooperative  Airway & Oxygen Therapy: Patient Spontanous Breathing  Post-op Assessment: Report given to RN and Post -op Vital signs reviewed and stable  Post vital signs: Reviewed and stable  Last Vitals:  Vitals Value Taken Time  BP    Temp    Pulse    Resp    SpO2    SEE VITAL SIGN FLOW SHEET  Last Pain:  Vitals:   08/02/21 0757  TempSrc: Oral  PainSc: 0-No pain      Patients Stated Pain Goal: 5 (80/88/11 0315)  Complications: No notable events documented.

## 2021-08-02 NOTE — Discharge Instructions (Addendum)
You are being discharged to home.  Resume your previous diet.  We are waiting for your pathology results.  Your physician has recommended a repeat colonoscopy for surveillance based on pathology results.  

## 2021-08-02 NOTE — Op Note (Signed)
[facility] Patient Name: Charles [name] Procedure Date: 08/02/2021 9:13 AM MRN: [id number] Date of Birth: 03/24/1956 Attending MD: Maylon Peppers ,  CSN: [id number] Age: 65 Admit Type: Outpatient Procedure:                Colonoscopy Indications:              High risk colon cancer surveillance: Personal                            history of colonic polyps Providers:                Maylon Peppers, Hinton Rao, RN, Aram Candela Referring MD:              Medicines:                Monitored Anesthesia Care Complications:            No immediate complications. Estimated Blood Loss:     Estimated blood loss: none. Procedure:                Pre-Anesthesia Assessment:                           - Prior to the procedure, a History and Physical                            was performed, and patient medications, allergies                            and sensitivities were reviewed. The patient's                            tolerance of previous anesthesia was reviewed.                           - The risks and benefits of the procedure and the                            sedation options and risks were discussed with the                            patient. All questions were answered and informed                            consent was obtained.                           After obtaining informed consent, the colonoscope                            was passed under direct vision. Throughout the                            procedure, the patient's blood pressure, pulse, and                            oxygen saturations were monitored  continuously. The                            [id number]-HQ190L ([id number]) scope was introduced through                            the anus and advanced to the the cecum, identified                            by the appendiceal orifice, ileocecal valve and                            palpation. The colonoscopy was performed without                            difficulty.  The patient tolerated the procedure                            well. The quality of the bowel preparation was                            adequate. Scope In: 9:16:13 AM Scope Out: 9:40:29 AM Scope Withdrawal Time: 0 hours 13 minutes 18 seconds  Total Procedure Duration: 0 hours 24 minutes 16 seconds  Findings:      The perianal and digital rectal examinations were normal.      Three sessile polyps were found in the transverse colon and ascending       colon. The polyps were 2 to 5 mm in size. These polyps were removed with       a cold snare. Resection and retrieval were complete.      The ascending colon revealed moderately excessive looping. Advancing the       scope required applying abdominal pressure.      Non-bleeding internal hemorrhoids were found during retroflexion. The       hemorrhoids were moderate. Impression:               - Three 2 to 5 mm polyps in the transverse colon                            and in the ascending colon, removed with a cold                            snare. Resected and retrieved.                           - There was significant looping of the colon.                           - Non-bleeding internal hemorrhoids. Moderate Sedation:      Per Anesthesia Care Recommendation:           - Discharge patient to home (ambulatory).                           - Resume previous diet.                           -  Await pathology results.                           - Repeat colonoscopy for surveillance based on                            pathology results. Procedure Code(s):        --- Professional ---                           [phone], Colonoscopy, flexible; with removal of                            tumor(s), polyp(s), or other lesion(s) by snare                            technique Diagnosis Code(s):        --- Professional ---                           Z86.010, Personal history of colonic polyps                           K63.5, Polyp of colon                            K64.8, Other hemorrhoids CPT copyright 2019 American Medical Association. All rights reserved. The codes documented in this report are preliminary and upon coder review may  be revised to meet current compliance requirements. Maylon Peppers, MD Maylon Peppers,  08/02/2021 9:48:28 AM This report has been signed electronically. Number of Addenda: 0

## 2021-08-02 NOTE — H&P (Signed)
Charles [name] is an 65 y.o. male.   Chief Complaint: Anemia HPI: Charles [name] is a 65 y.o. male with past medical history of dementia, diabetes, hypertension, hepatitis C status post Solvadi and ribavirin,  schizophrenia and CKD who presents for evaluation of anemia and follow-up of adenomatous colon polyps.  Patient was found to have mild anemia without presence of iron deficiency as outpatient and was referred to our clinic.  Denies having any melena, hematochezia, abdominal pain, nausea, vomiting, fever, chills.  He is not taking any anticoagulation but used to take meloxicam twice a day.  Never had an EGD in the past, last colonoscopy was in 2019 and he is due for repeat colonoscopy for surveillance of adenomatous polyps.  Past Medical History:  Diagnosis Date   Anemia    Anxiety    Chronic back pain    Dementia    Nursing facility feels he has Dementia   Diabetes mellitus    Facial numbness    Fracture of right foot    Hypertension    Mental disorder    schizophrenia    Past Surgical History:  Procedure Laterality Date   COLONOSCOPY N/A 04/11/2018   Procedure: COLONOSCOPY;  Surgeon: Rogene Houston, MD;  [facility];  Service: Endoscopy;  Laterality: N/A;  10:30   COLONOSCOPY WITH PROPOFOL N/A 07/05/2018   Procedure: COLONOSCOPY WITH PROPOFOL;  Surgeon: Rogene Houston, MD;  [facility];  Service: Endoscopy;  Laterality: N/A;  10:35   left 1st toe     ORIF TOE FRACTURE  09/05/2011   Procedure: OPEN REDUCTION INTERNAL FIXATION (ORIF) METATARSAL (TOE) FRACTURE;  Surgeon: Colin Rhein;  Location: [facility];  Service: Orthopedics;  Laterality: Left;  reconstruction left great toe FHB plantar plate   POLYPECTOMY  04/11/2018   Procedure: POLYPECTOMY;  Surgeon: Rogene Houston, MD;  [facility];  Service: Endoscopy;;  colon   POLYPECTOMY  07/05/2018   Procedure: POLYPECTOMY;  Surgeon: Rogene Houston, MD;  [facility];   Service: Endoscopy;;  colon   right foot suger      Family History  Problem Relation Age of Onset   Diabetes Brother    Colon cancer Neg Hx    Liver disease Neg Hx        unknown for sure, mom died at age 69   Social History:  reports that he has been smoking cigarettes. He has a 10.00 pack-year smoking history. He has never used smokeless tobacco. He reports current alcohol use. He reports that he does not use drugs.  Allergies: No Known Allergies  Medications Prior to Admission  Medication Sig Dispense Refill   acetaminophen (TYLENOL 8 HOUR) 650 MG CR tablet Take 1 tablet (650 mg total) by mouth every 8 (eight) hours as needed for pain or fever. 30 tablet 0   amLODipine (NORVASC) 10 MG tablet Take 10 mg by mouth daily.     aspirin EC 81 MG tablet Take 1 tablet (81 mg total) by mouth daily with breakfast.     calcitRIOL (ROCALTROL) 0.25 MCG capsule Take 0.25 mcg by mouth 3 (three) times a week.     Cholecalciferol 25 MCG (1000 UT) capsule Take 1,000 Units by mouth daily.     diclofenac Sodium (VOLTAREN) 1 % GEL Apply 2 g topically 4 (four) times daily.     ferrous sulfate 325 (65 FE) MG tablet Take 325 mg by mouth daily with breakfast.  furosemide (LASIX) 40 MG tablet Take 40 mg by mouth 2 (two) times daily.     gabapentin (NEURONTIN) 300 MG capsule Take 300 mg by mouth 3 (three) times daily.     labetalol (NORMODYNE) 300 MG tablet Take 300 mg by mouth 2 (two) times daily.     meloxicam (MOBIC) 7.5 MG tablet Take 7.5 mg by mouth 2 (two) times daily. prn     NIFEdipine (ADALAT CC) 60 MG 24 hr tablet Take 60 mg by mouth daily.     OVER THE COUNTER MEDICATION D 3 5,000 IU once per day. In addition to five 1,000 IU daily.     OVER THE COUNTER MEDICATION Vit D 3 1,000 IU five capsule per day in addition to a 5,000 IU capsule.     polyethylene glycol-electrolytes (TRILYTE) 420 g solution Take 4,000 mLs by mouth as directed. 4000 mL 0   pravastatin (PRAVACHOL) 40 MG tablet Take 40 mg by  mouth daily.      sodium bicarbonate 650 MG tablet Take 650 mg by mouth 3 (three) times daily.     tamsulosin (FLOMAX) 0.4 MG CAPS capsule Take 0.4 mg by mouth.      Results for orders placed or performed during the hospital encounter of 08/02/21 (from the past 48 hour(s))  Glucose, capillary     Status: Abnormal   Collection Time: 08/02/21  8:15 AM  Result Value Ref Range   Glucose-Capillary 67 (L) 70 - 99 mg/dL    Comment: Glucose reference range applies only to samples taken after fasting for at least 8 hours.   No results found.  Review of Systems  Constitutional: Negative.   HENT: Negative.    Eyes: Negative.   Respiratory: Negative.    Cardiovascular: Negative.   Gastrointestinal: Negative.   Endocrine: Negative.   Genitourinary: Negative.   Musculoskeletal: Negative.   Skin: Negative.   Allergic/Immunologic: Negative.   Neurological: Negative.   Hematological: Negative.   Psychiatric/Behavioral: Negative.     Blood pressure (!) 190/91, temperature 98 F (36.7 C), temperature source Oral, resp. rate 18, SpO2 98 %. Physical Exam  GENERAL: The patient is AO x3, in no acute distress. HEENT: Head is normocephalic and atraumatic. EOMI are intact. Mouth is well hydrated and without lesions. NECK: Supple. No masses LUNGS: Clear to auscultation. No presence of rhonchi/wheezing/rales. Adequate chest expansion HEART: RRR, normal s1 and s2. ABDOMEN: Soft, nontender, no guarding, no peritoneal signs, and nondistended. BS +. No masses. EXTREMITIES: Without any cyanosis, clubbing, rash, lesions or edema. NEUROLOGIC: AOx3, no focal motor deficit. SKIN: no jaundice, no rashes  Assessment/Plan NYHEEM BINETTE is a 65 y.o. male with past medical history of dementia, diabetes, hypertension, hepatitis C status post Solvadi and ribavirin,  schizophrenia and CKD who presents for evaluation of anemia and follow-up of adenomatous colon polyps.  We will proceed with EGD and  colonoscopy.  Harvel Quale, MD 08/02/2021, 8:33 AM

## 2021-08-02 NOTE — Anesthesia Postprocedure Evaluation (Signed)
Anesthesia Post Note  Patient: Charles [name]  Procedure(s) Performed: COLONOSCOPY WITH PROPOFOL ESOPHAGOGASTRODUODENOSCOPY (EGD) WITH PROPOFOL POLYPECTOMY BIOPSY  Patient location during evaluation: Phase II Anesthesia Type: General Level of consciousness: awake Pain management: pain level controlled Vital Signs Assessment: post-procedure vital signs reviewed and stable Respiratory status: spontaneous breathing and respiratory function stable Cardiovascular status: blood pressure returned to baseline and stable Postop Assessment: no headache and no apparent nausea or vomiting Anesthetic complications: no Comments: Late entry   No notable events documented.   Last Vitals:  Vitals:   08/02/21 0947 08/02/21 0951  BP:  (!) 167/99  Pulse: 86   Resp: 20   Temp: (!) 36.1 C   SpO2: 99%     Last Pain:  Vitals:   08/02/21 0947  TempSrc: Oral  PainSc: 0-No pain                 Louann Sjogren

## 2021-08-02 NOTE — Anesthesia Procedure Notes (Signed)
Date/Time: 08/02/2021 8:55 AM Performed by: Vista Deck, CRNA Pre-anesthesia Checklist: Patient identified, Emergency Drugs available, Suction available, Timeout performed and Patient being monitored Patient Re-evaluated:Patient Re-evaluated prior to induction Oxygen Delivery Method: Nasal Cannula

## 2021-08-02 NOTE — Op Note (Signed)
[facility] Patient Name: Charles [name] Procedure Date: 08/02/2021 8:43 AM MRN: [id number] Date of Birth: Aug 17, 1956 Attending MD: Maylon Peppers ,  CSN: [id number] Age: 65 Admit Type: Outpatient Procedure:                Upper GI endoscopy Indications:              Anemia Providers:                Maylon Peppers, Hinton Rao, RN, Aram Candela Referring MD:              Medicines:                Monitored Anesthesia Care Complications:            No immediate complications. Estimated Blood Loss:     Estimated blood loss: none. Procedure:                Pre-Anesthesia Assessment:                           - Prior to the procedure, a History and Physical                            was performed, and patient medications, allergies                            and sensitivities were reviewed. The patient's                            tolerance of previous anesthesia was reviewed.                           - The risks and benefits of the procedure and the                            sedation options and risks were discussed with the                            patient. All questions were answered and informed                            consent was obtained.                           After obtaining informed consent, the endoscope was                            passed under direct vision. Throughout the                            procedure, the patient's blood pressure, pulse, and                            oxygen saturations were monitored continuously. The                            [id number]-H190 ([id number]) scope was introduced  through the                            mouth, and advanced to the second part of duodenum.                            The upper GI endoscopy was accomplished without                            difficulty. The patient tolerated the procedure                            well. Scope In: 9:03:16 AM Scope Out: 9:11:01 AM Total Procedure Duration: 0 hours 7 minutes 45  seconds  Findings:      The examined esophagus was normal.      Diffuse mildly erythematous mucosa without bleeding was found in the       gastric antrum. Biopsies were taken with a cold forceps for Helicobacter       pylori testing.      The examined duodenum was normal. Biopsies were taken with a cold       forceps for histology. Impression:               - Normal esophagus.                           - Erythematous mucosa in the antrum. Biopsied.                           - Normal examined duodenum. Biopsied. Moderate Sedation:      Per Anesthesia Care Recommendation:           - Discharge patient to home (ambulatory).                           - Resume previous diet.                           - Await pathology results. Procedure Code(s):        --- Professional ---                           [phone], Esophagogastroduodenoscopy, flexible,                            transoral; with biopsy, single or multiple Diagnosis Code(s):        --- Professional ---                           K31.89, Other diseases of stomach and duodenum                           D64.9, Anemia, unspecified CPT copyright 2019 American Medical Association. All rights reserved. The codes documented in this report are preliminary and upon coder review may  be revised to meet current compliance requirements. Maylon Peppers, MD Maylon Peppers,  08/02/2021 9:43:38 AM This report has been signed electronically. Number of Addenda: 0

## 2021-08-04 ENCOUNTER — Encounter (INDEPENDENT_AMBULATORY_CARE_PROVIDER_SITE_OTHER): Payer: Self-pay | Admitting: *Deleted

## 2021-08-04 ENCOUNTER — Other Ambulatory Visit (INDEPENDENT_AMBULATORY_CARE_PROVIDER_SITE_OTHER): Payer: Self-pay | Admitting: Gastroenterology

## 2021-08-04 DIAGNOSIS — B9681 Helicobacter pylori [H. pylori] as the cause of diseases classified elsewhere: Secondary | ICD-10-CM

## 2021-08-04 LAB — SURGICAL PATHOLOGY

## 2021-08-04 MED ORDER — BISMUTH 262 MG PO CHEW: 2.0000 | CHEWABLE_TABLET | Freq: Four times a day (QID) | ORAL | 0 refills | Status: DC

## 2021-08-04 MED ORDER — METRONIDAZOLE 500 MG PO TABS: 500.0000 mg | ORAL_TABLET | Freq: Three times a day (TID) | ORAL | 0 refills | Status: AC

## 2021-08-04 MED ORDER — TETRACYCLINE HCL 500 MG PO CAPS: 500.0000 mg | ORAL_CAPSULE | Freq: Four times a day (QID) | ORAL | 0 refills | Status: AC

## 2021-08-04 MED ORDER — OMEPRAZOLE 40 MG PO CPDR: 40.0000 mg | DELAYED_RELEASE_CAPSULE | Freq: Two times a day (BID) | ORAL | 0 refills | Status: DC

## 2021-08-09 ENCOUNTER — Encounter (HOSPITAL_COMMUNITY): Payer: Self-pay | Admitting: Gastroenterology

## 2021-10-08 ENCOUNTER — Other Ambulatory Visit: Payer: Self-pay

## 2021-10-08 ENCOUNTER — Encounter (HOSPITAL_COMMUNITY): Payer: Self-pay | Admitting: *Deleted

## 2021-10-08 ENCOUNTER — Emergency Department (HOSPITAL_COMMUNITY)
Admission: EM | Admit: 2021-10-08 | Discharge: 2021-10-08 | Disposition: A | Discharge status: Home / Self Care | Payer: Medicaid Other | Attending: Emergency Medicine | Admitting: Emergency Medicine

## 2021-10-08 DIAGNOSIS — F0390 Unspecified dementia without behavioral disturbance: Secondary | ICD-10-CM | POA: Diagnosis not present

## 2021-10-08 DIAGNOSIS — E119 Type 2 diabetes mellitus without complications: Secondary | ICD-10-CM | POA: Insufficient documentation

## 2021-10-08 DIAGNOSIS — I10 Essential (primary) hypertension: Secondary | ICD-10-CM | POA: Diagnosis not present

## 2021-10-08 DIAGNOSIS — R059 Cough, unspecified: Secondary | ICD-10-CM | POA: Diagnosis present

## 2021-10-08 DIAGNOSIS — Z7982 Long term (current) use of aspirin: Secondary | ICD-10-CM | POA: Diagnosis not present

## 2021-10-08 DIAGNOSIS — U071 COVID-19: Secondary | ICD-10-CM | POA: Insufficient documentation

## 2021-10-08 DIAGNOSIS — Z79899 Other long term (current) drug therapy: Secondary | ICD-10-CM | POA: Diagnosis not present

## 2021-10-08 DIAGNOSIS — F17210 Nicotine dependence, cigarettes, uncomplicated: Secondary | ICD-10-CM | POA: Diagnosis not present

## 2021-10-08 LAB — RESP PANEL BY RT-PCR (FLU A&B, COVID) ARPGX2
Influenza A by PCR: NEGATIVE
Influenza B by PCR: NEGATIVE
SARS Coronavirus 2 by RT PCR: POSITIVE — AB

## 2021-10-08 NOTE — ED Triage Notes (Signed)
Pt with covid positive test before the first of this month.  Pt denies any symptoms.

## 2021-10-08 NOTE — ED Provider Notes (Signed)
[facility] EMERGENCY DEPARTMENT Provider Note  CSN: [id number] Arrival date & time: 10/08/21 1200    History Chief Complaint  Patient presents with   covid +    Charles [name] is a 65 y.o. male presents to the ED for evaluation. He states 'they' came around and tested a bunch of people for covid recently and he was told his was positive although he cannot give any more details about who was testing him or when the test was done. He denies any Covid symptoms except an occasional cough. He is here today to 'find out if he really has it'.    Past Medical History:  Diagnosis Date   Anemia    Anxiety    Chronic back pain    Dementia    Nursing facility feels he has Dementia   Diabetes mellitus    Facial numbness    Fracture of right foot    Hypertension    Mental disorder    schizophrenia    Past Surgical History:  Procedure Laterality Date   BIOPSY  08/02/2021   Procedure: BIOPSY;  Surgeon: Harvel Quale, MD;  [facility];  Service: Gastroenterology;;  duodenal and gastric biopsies   COLONOSCOPY N/A 04/11/2018   Procedure: COLONOSCOPY;  Surgeon: Rogene Houston, MD;  [facility];  Service: Endoscopy;  Laterality: N/A;  10:30   COLONOSCOPY WITH PROPOFOL N/A 07/05/2018   Procedure: COLONOSCOPY WITH PROPOFOL;  Surgeon: Rogene Houston, MD;  [facility];  Service: Endoscopy;  Laterality: N/A;  10:35   COLONOSCOPY WITH PROPOFOL N/A 08/02/2021   Procedure: COLONOSCOPY WITH PROPOFOL;  Surgeon: Harvel Quale, MD;  [facility];  Service: Gastroenterology;  Laterality: N/A;  9:05   ESOPHAGOGASTRODUODENOSCOPY (EGD) WITH PROPOFOL N/A 08/02/2021   Procedure: ESOPHAGOGASTRODUODENOSCOPY (EGD) WITH PROPOFOL;  Surgeon: Harvel Quale, MD;  [facility];  Service: Gastroenterology;  Laterality: N/A;   left 1st toe     ORIF TOE FRACTURE  09/05/2011   Procedure: OPEN REDUCTION INTERNAL FIXATION (ORIF)  METATARSAL (TOE) FRACTURE;  Surgeon: Colin Rhein;  Location: [place];  Service: Orthopedics;  Laterality: Left;  reconstruction left great toe FHB plantar plate   POLYPECTOMY  04/11/2018   Procedure: POLYPECTOMY;  Surgeon: Rogene Houston, MD;  [facility];  Service: Endoscopy;;  colon   POLYPECTOMY  07/05/2018   Procedure: POLYPECTOMY;  Surgeon: Rogene Houston, MD;  [facility];  Service: Endoscopy;;  colon   POLYPECTOMY  08/02/2021   Procedure: POLYPECTOMY;  Surgeon: Harvel Quale, MD;  [facility];  Service: Gastroenterology;;  transverse colon polyp x 1 ascending colon polyp   right foot suger      Family History  Problem Relation Age of Onset   Diabetes Brother    Colon cancer Neg Hx    Liver disease Neg Hx        unknown for sure, mom died at age 60    Social History   Tobacco Use   Smoking status: Every Day    Packs/day: 0.25    Years: 40.00    Pack years: 10.00    Types: Cigarettes   Smokeless tobacco: Never  Vaping Use   Vaping Use: Never used  Substance Use Topics   Alcohol use: Yes    Comment: 16-24 oz occasionally   Drug use: No     Home Medications Prior to Admission medications  Medication Sig Start Date End Date Taking? Authorizing Provider  acetaminophen (TYLENOL 8 HOUR) 650 MG CR tablet Take 1 tablet (650 mg total) by mouth every 8 (eight) hours as needed for pain or fever. 07/28/20   Varney Biles, MD  amLODipine (NORVASC) 10 MG tablet Take 10 mg by mouth daily.    [provider]  aspirin EC 81 MG tablet Take 1 tablet (81 mg total) by mouth daily with breakfast. 07/15/18   Rehman, Mechele Dawley, MD  Bismuth 262 MG CHEW Chew 2 tablets by mouth every 6 (six) hours. 08/04/21   Harvel Quale, MD  calcitRIOL (ROCALTROL) 0.25 MCG capsule Take 0.25 mcg by mouth 3 (three) times a week.    [provider]  Cholecalciferol 25 MCG (1000 UT) capsule Take 1,000 Units by mouth  daily.    [provider]  diclofenac Sodium (VOLTAREN) 1 % GEL Apply 2 g topically 4 (four) times daily.    [provider]  ferrous sulfate 325 (65 FE) MG tablet Take 325 mg by mouth daily with breakfast.    [provider]  furosemide (LASIX) 40 MG tablet Take 40 mg by mouth 2 (two) times daily.    [provider]  gabapentin (NEURONTIN) 300 MG capsule Take 300 mg by mouth 3 (three) times daily. 04/25/19   [provider]  labetalol (NORMODYNE) 300 MG tablet Take 300 mg by mouth 2 (two) times daily.    [provider]  meloxicam (MOBIC) 7.5 MG tablet Take 7.5 mg by mouth 2 (two) times daily. prn    [provider]  NIFEdipine (ADALAT CC) 60 MG 24 hr tablet Take 60 mg by mouth daily. 05/19/21   [provider]  omeprazole (PRILOSEC) 40 MG capsule Take 1 capsule (40 mg total) by mouth 2 (two) times daily for 14 days. 08/04/21 08/18/21  Harvel Quale, MD  OVER THE COUNTER MEDICATION D 3 5,000 IU once per day. In addition to five 1,000 IU daily.    [provider]  OVER THE COUNTER MEDICATION Vit D 3 1,000 IU five capsule per day in addition to a 5,000 IU capsule.    [provider]  pravastatin (PRAVACHOL) 40 MG tablet Take 40 mg by mouth daily.     [provider]  sodium bicarbonate 650 MG tablet Take 650 mg by mouth 3 (three) times daily.    [provider]  tamsulosin (FLOMAX) 0.4 MG CAPS capsule Take 0.4 mg by mouth.    [provider]     Allergies    Patient has no known allergies.   Review of Systems   Review of Systems A comprehensive review of systems was completed and negative except as noted in HPI.    Physical Exam BP (!) 177/91   Pulse 86   Temp 97.8 F (36.6 C) (Oral)   Resp 14   Ht 6' (1.829 m)   Wt 94.3 kg   SpO2 100%   BMI 28.20 kg/m   Physical Exam Vitals and nursing note reviewed.  Constitutional:      Appearance: Normal appearance.   HENT:     Head: Normocephalic and atraumatic.     Nose: Nose normal.     Mouth/Throat:     Mouth: Mucous membranes are moist.  Eyes:     Extraocular Movements: Extraocular movements intact.     Conjunctiva/sclera: Conjunctivae normal.  Cardiovascular:     Rate and Rhythm: Normal rate.  Pulmonary:  Effort: Pulmonary effort is normal.     Breath sounds: Normal breath sounds.  Abdominal:     General: Abdomen is flat.     Palpations: Abdomen is soft.     Tenderness: There is no abdominal tenderness.  Musculoskeletal:        General: No swelling. Normal range of motion.     Cervical back: Neck supple.  Skin:    General: Skin is warm and dry.  Neurological:     General: No focal deficit present.     Mental Status: He is alert.  Psychiatric:        Mood and Affect: Mood normal.     ED Results / Procedures / Treatments   Labs (all labs ordered are listed, but only abnormal results are displayed) Labs Reviewed  RESP PANEL BY RT-PCR (FLU A&B, COVID) ARPGX2 - Abnormal; Notable for the following components:      Result Value   SARS Coronavirus 2 by RT PCR POSITIVE (*)    All other components within normal limits    EKG None  Radiology No results found.  Procedures Procedures  Medications Ordered in the ED Medications - No data to display   MDM Rules/Calculators/A&P MDM Patient well appearing, no concerning symptoms. He wants to know if he really has Covid or not, I do not have access to whatever prior testing he's had recently to confirm. If he was actually positive in the last few weeks he may continue to be positive now despite not being contagious.   ED Course  I have reviewed the triage vital signs and the nursing notes.  Pertinent labs & imaging results that were available during my care of the patient were reviewed by me and considered in my medical decision making (see chart for details).  Clinical Course as of 10/08/21 1433  Sat Oct 08, 2021  1430  Covid is confirmed positive. It is unclear when he first had a positive test, all he can tell me is it was 'the other day'. Given he has not current symptoms not further management is necessary.  [CS]    Clinical Course User Index [CS] Truddie Hidden, MD    Final Clinical Impression(s) / ED Diagnoses Final diagnoses:  Lab test positive for detection of COVID-19 virus    Rx / DC Orders ED Discharge Orders     None        Truddie Hidden, MD 10/08/21 1433

## 2021-10-11 ENCOUNTER — Ambulatory Visit (INDEPENDENT_AMBULATORY_CARE_PROVIDER_SITE_OTHER): Payer: Medicaid Other | Admitting: Podiatry

## 2021-10-11 DIAGNOSIS — Z91199 Patient's noncompliance with other medical treatment and regimen due to unspecified reason: Secondary | ICD-10-CM

## 2021-10-11 DIAGNOSIS — U071 COVID-19: Secondary | ICD-10-CM

## 2021-10-11 NOTE — Progress Notes (Signed)
Patient tested positive for Covid on 10/08/2021 in [facility] ED. No charge.

## 2021-10-18 ENCOUNTER — Ambulatory Visit (INDEPENDENT_AMBULATORY_CARE_PROVIDER_SITE_OTHER): Payer: Medicaid Other | Admitting: Internal Medicine

## 2021-10-18 ENCOUNTER — Encounter: Payer: Self-pay | Admitting: Internal Medicine

## 2021-10-18 ENCOUNTER — Other Ambulatory Visit: Payer: Self-pay

## 2021-10-18 VITALS — BP 142/92 | HR 87 | Resp 18 | Ht 72.0 in | Wt 202.1 lb

## 2021-10-18 DIAGNOSIS — I120 Hypertensive chronic kidney disease with stage 5 chronic kidney disease or end stage renal disease: Secondary | ICD-10-CM | POA: Diagnosis not present

## 2021-10-18 DIAGNOSIS — N400 Enlarged prostate without lower urinary tract symptoms: Secondary | ICD-10-CM | POA: Insufficient documentation

## 2021-10-18 DIAGNOSIS — E785 Hyperlipidemia, unspecified: Secondary | ICD-10-CM | POA: Insufficient documentation

## 2021-10-18 DIAGNOSIS — E782 Mixed hyperlipidemia: Secondary | ICD-10-CM

## 2021-10-18 DIAGNOSIS — G609 Hereditary and idiopathic neuropathy, unspecified: Secondary | ICD-10-CM | POA: Diagnosis not present

## 2021-10-18 DIAGNOSIS — I10 Essential (primary) hypertension: Secondary | ICD-10-CM | POA: Insufficient documentation

## 2021-10-18 DIAGNOSIS — R7303 Prediabetes: Secondary | ICD-10-CM | POA: Insufficient documentation

## 2021-10-18 DIAGNOSIS — N401 Enlarged prostate with lower urinary tract symptoms: Secondary | ICD-10-CM

## 2021-10-18 DIAGNOSIS — N185 Chronic kidney disease, stage 5: Secondary | ICD-10-CM

## 2021-10-18 DIAGNOSIS — R351 Nocturia: Secondary | ICD-10-CM

## 2021-10-18 DIAGNOSIS — N184 Chronic kidney disease, stage 4 (severe): Secondary | ICD-10-CM

## 2021-10-18 DIAGNOSIS — Z2821 Immunization not carried out because of patient refusal: Secondary | ICD-10-CM

## 2021-10-18 DIAGNOSIS — D631 Anemia in chronic kidney disease: Secondary | ICD-10-CM

## 2021-10-18 DIAGNOSIS — K5900 Constipation, unspecified: Secondary | ICD-10-CM | POA: Insufficient documentation

## 2021-10-18 DIAGNOSIS — Z720 Tobacco use: Secondary | ICD-10-CM | POA: Insufficient documentation

## 2021-10-18 DIAGNOSIS — K5904 Chronic idiopathic constipation: Secondary | ICD-10-CM

## 2021-10-18 MED ORDER — GABAPENTIN 300 MG PO CAPS: 300.0000 mg | ORAL_CAPSULE | Freq: Three times a day (TID) | ORAL | 2 refills | Status: DC

## 2021-10-18 NOTE — Assessment & Plan Note (Signed)
Takes gabapentin 300 mg TID, refilled 

## 2021-10-18 NOTE — Patient Instructions (Signed)
Please continue taking medications as prescribed.  Please continue to take low salt diet and ambulate as tolerated.

## 2021-10-18 NOTE — Assessment & Plan Note (Signed)
On statin.

## 2021-10-18 NOTE — Assessment & Plan Note (Signed)
Smokes about 0.5 pack/day  Asked about quitting: confirms that he/she currently smokes cigarettes Advise to quit smoking: Educated about QUITTING to reduce the risk of cancer, cardio and cerebrovascular disease. Assess willingness: Unwilling to quit at this time, but is working on cutting back. Assist with counseling and pharmacotherapy: Counseled for 5 minutes and literature provided. Arrange for follow up: follow up in 3 months and continue to offer help. 

## 2021-10-18 NOTE — Assessment & Plan Note (Signed)
BP Readings from Last 1 Encounters:  10/18/21 (!) 142/92   uncontrolled due to noncompliance Followed by nephrology Advised to take amlodipine and losartan regularly Counseled for compliance with the medications Advised DASH diet and moderate exercise/walking, at least 150 mins/week

## 2021-10-18 NOTE — Assessment & Plan Note (Signed)
On tamsulosin.

## 2021-10-18 NOTE — Assessment & Plan Note (Addendum)
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 Theador Hawthorne On calcitriol and sodium bicarb, has not been taking meds due to constipation, advised to stay compliant Has been taking Mobic for arthritis, advised to avoid NSAIDs

## 2021-10-18 NOTE — Progress Notes (Signed)
New Patient Office Visit  Subjective:  Patient ID: Charles [name], male    DOB: [birth year]  Age: 65 y.o. MRN: [id number]  CC:  Chief Complaint  Patient presents with   New Patient (Initial Visit)    New patient was seeing dr Glee Arvin pt was covid pos 10-07-21 no symptoms also needs refill on medication and needs FL2 filled out doesn't have to be today    HPI Charles [name] is a 65 y.o. male with past medical history of HTN, CKD, BPH, anemia of chronic disease, prediabetes, chronic constipation and tobacco abuse who presents for establishing care.  HTN: His BP was elevated in the office today.  Has not been taking his amlodipine and losartan.  He denies any headache, dizziness, chest pain, dyspnea or palpitations.  He has a history of CKD stage IV according to chart review.  He follows up with DR. Bhutani.  He has stopped taking all of his meds as he was having constipation, which has improved now.  He denies any dysuria, hematuria or urinary hesitance or resistance currently.  He also used to take tamsulosin for BPH.  He used to take iron supplement for anemia of chronic disease.  Denies any active signs of bleeding including melena, hematochezia or hematuria.  He has history of peripheral neuropathy, for which he used to take gabapentin and requests a refill of it.  Of note, he continues to take Mobic for arthritis, but agrees not to take it considering his CKD.  He has had 3 doses of COVID-vaccine.  He also has had 1 dose of pneumococcal vaccine through health department.  Past Medical History:  Diagnosis Date   Anemia    Anxiety    Chronic back pain    Dementia    Nursing facility feels he has Dementia   Diabetes mellitus    Facial numbness    Fracture of right foot    Hypertension    Mental disorder    schizophrenia    Past Surgical History:  Procedure Laterality Date   BIOPSY  08/02/2021   Procedure: BIOPSY;  Surgeon: Harvel Quale, MD;  [facility];  Service: Gastroenterology;;  duodenal and gastric biopsies   COLONOSCOPY N/A 04/11/2018   Procedure: COLONOSCOPY;  Surgeon: Rogene Houston, MD;  [facility];  Service: Endoscopy;  Laterality: N/A;  10:30   COLONOSCOPY WITH PROPOFOL N/A 07/05/2018   Procedure: COLONOSCOPY WITH PROPOFOL;  Surgeon: Rogene Houston, MD;  [facility];  Service: Endoscopy;  Laterality: N/A;  10:35   COLONOSCOPY WITH PROPOFOL N/A 08/02/2021   Procedure: COLONOSCOPY WITH PROPOFOL;  Surgeon: Harvel Quale, MD;  [facility];  Service: Gastroenterology;  Laterality: N/A;  9:05   ESOPHAGOGASTRODUODENOSCOPY (EGD) WITH PROPOFOL N/A 08/02/2021   Procedure: ESOPHAGOGASTRODUODENOSCOPY (EGD) WITH PROPOFOL;  Surgeon: Harvel Quale, MD;  [facility];  Service: Gastroenterology;  Laterality: N/A;   left 1st toe     ORIF TOE FRACTURE  09/05/2011   Procedure: OPEN REDUCTION INTERNAL FIXATION (ORIF) METATARSAL (TOE) FRACTURE;  Surgeon: Colin Rhein;  Location: [place];  Service: Orthopedics;  Laterality: Left;  reconstruction left great toe FHB plantar plate   POLYPECTOMY  04/11/2018   Procedure: POLYPECTOMY;  Surgeon: Rogene Houston, MD;  [facility];  Service: Endoscopy;;  colon   POLYPECTOMY  07/05/2018   Procedure: POLYPECTOMY;  Surgeon: Rogene Houston, MD;  [facility];  Service: Endoscopy;;  colon   POLYPECTOMY  08/02/2021   Procedure: POLYPECTOMY;  Surgeon: Montez Morita, Quillian Quince, MD;  [facility];  Service: Gastroenterology;;  transverse colon polyp x 1 ascending colon polyp   right foot suger      Family History  Problem Relation Age of Onset   Diabetes Brother    Colon cancer Neg Hx    Liver disease Neg Hx        unknown for sure, mom died at age 65    Social History   Socioeconomic History   Marital status: Single    Spouse name: Not on file   Number of children: Not on file    Years of education: Not on file   Highest education level: Not on file  Occupational History   Not on file  Tobacco Use   Smoking status: Every Day    Packs/day: 0.25    Years: 40.00    Pack years: 10.00    Types: Cigarettes   Smokeless tobacco: Never  Vaping Use   Vaping Use: Never used  Substance and Sexual Activity   Alcohol use: Yes    Comment: 16-24 oz occasionally   Drug use: No   Sexual activity: Not on file  Other Topics Concern   Not on file  Social History Narrative   Not on file   Social Determinants of Health   Financial Resource Strain: Not on file  Food Insecurity: Not on file  Transportation Needs: Not on file  Physical Activity: Not on file  Stress: Not on file  Social Connections: Not on file  Intimate Partner Violence: Not on file    ROS Review of Systems  Constitutional:  Negative for chills and fever.  HENT:  Negative for congestion and sore throat.   Eyes:  Negative for pain and discharge.  Respiratory:  Negative for cough and shortness of breath.   Cardiovascular:  Negative for chest pain and palpitations.  Gastrointestinal:  Positive for constipation. Negative for diarrhea, nausea and vomiting.  Endocrine: Negative for polydipsia and polyuria.  Genitourinary:  Negative for dysuria and hematuria.  Musculoskeletal:  Positive for arthralgias. Negative for neck pain and neck stiffness.  Skin:  Negative for rash.  Neurological:  Negative for dizziness, weakness, numbness and headaches.  Psychiatric/Behavioral:  Negative for agitation and behavioral problems.    Objective:   Today's Vitals: BP (!) 142/92 (BP Location: Left Arm, Patient Position: Sitting, Cuff Size: Normal)    Pulse 87    Resp 18    Ht 6' (1.829 m)    Wt 202 lb 1.9 oz (91.7 kg)    SpO2 97%    BMI 27.41 kg/m   Physical Exam Vitals reviewed.  Constitutional:      General: He is not in acute distress.    Appearance: He is not diaphoretic.  HENT:     Head: Normocephalic and  atraumatic.     Nose: Nose normal.     Mouth/Throat:     Mouth: Mucous membranes are moist.  Eyes:     General: No scleral icterus.    Extraocular Movements: Extraocular movements intact.  Cardiovascular:     Rate and Rhythm: Normal rate and regular rhythm.     Pulses: Normal pulses.     Heart sounds: Normal heart sounds. No murmur heard. Pulmonary:     Breath sounds: Normal breath sounds. No wheezing or rales.  Musculoskeletal:     Cervical back: Neck supple. No tenderness.  Right lower leg: No edema.     Left lower leg: No edema.  Skin:    General: Skin is warm.     Findings: No rash.  Neurological:     General: No focal deficit present.     Mental Status: He is alert and oriented to person, place, and time.  Psychiatric:        Mood and Affect: Mood normal.        Behavior: Behavior normal.    Assessment & Plan:   Problem List Items Addressed This Visit       Cardiovascular and Mediastinum   Essential hypertension - Primary    BP Readings from Last 1 Encounters:  10/18/21 (!) 142/92  uncontrolled due to noncompliance Followed by nephrology Advised to take amlodipine and losartan regularly Counseled for compliance with the medications Advised DASH diet and moderate exercise/walking, at least 150 mins/week       Relevant Medications   losartan (COZAAR) 25 MG tablet     Nervous and Auditory   Idiopathic peripheral neuropathy    Takes gabapentin 300 mg TID, refilled      Relevant Medications   gabapentin (NEURONTIN) 300 MG capsule     Genitourinary   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 Theador Hawthorne On calcitriol and sodium bicarb, has not been taking meds due to constipation, advised to stay compliant Has been taking Mobic for arthritis, advised to avoid NSAIDs       BPH (benign prostatic hyperplasia)    On tamsulosin        Other   Anemia    Chronic normocytic anemia, likely due to CKD On iron supplements Followed by nephrology      HLD (hyperlipidemia)    On statin      Relevant Medications   losartan (COZAAR) 25 MG tablet   Prediabetes    HbA1C: 6.0 Continue to follow low carb diet      Constipation    Uses OTC stool softeners      Tobacco abuse    Smokes about 0.5 pack/day  Asked about quitting: confirms that he/she currently smokes cigarettes Advise to quit smoking: Educated about QUITTING to reduce the risk of cancer, cardio and cerebrovascular disease. Assess willingness: Unwilling to quit at this time, but is working on cutting back. Assist with counseling and pharmacotherapy: Counseled for 5 minutes and literature provided. Arrange for follow up: follow up in 3 months and continue to offer help.      Other Visit Diagnoses     Refused influenza vaccine           Outpatient Encounter Medications as of 10/18/2021  Medication Sig   acetaminophen (TYLENOL 8 HOUR) 650 MG CR tablet Take 1 tablet (650 mg total) by mouth every 8 (eight) hours as needed for pain or fever.   amLODipine (NORVASC) 10 MG tablet Take 10 mg by mouth daily.   aspirin EC 81 MG tablet Take 1 tablet (81 mg total) by mouth daily with breakfast.   Bismuth 262 MG CHEW Chew 2 tablets by mouth every 6 (six) hours.   calcitRIOL (ROCALTROL) 0.25 MCG capsule Take 0.25 mcg by mouth 3 (three) times a week.   Cholecalciferol 25 MCG (1000 UT) capsule Take 1,000 Units by mouth daily.   diclofenac Sodium (VOLTAREN) 1 % GEL Apply 2  g topically 4 (four) times daily.   ferrous sulfate 325 (65 FE) MG tablet Take 325 mg by mouth daily with breakfast.   furosemide (LASIX) 40 MG tablet Take 40 mg by mouth 2 (two) times daily.   losartan (COZAAR) 25 MG tablet Take 25 mg by mouth daily.   OVER THE COUNTER MEDICATION D 3 5,000 IU once per day. In addition to five 1,000 IU  daily.   OVER THE COUNTER MEDICATION Vit D 3 1,000 IU five capsule per day in addition to a 5,000 IU capsule.   pravastatin (PRAVACHOL) 40 MG tablet Take 40 mg by mouth daily.    sodium bicarbonate 650 MG tablet Take 650 mg by mouth 3 (three) times daily.   tamsulosin (FLOMAX) 0.4 MG CAPS capsule Take 0.4 mg by mouth.   [DISCONTINUED] gabapentin (NEURONTIN) 300 MG capsule Take 300 mg by mouth 3 (three) times daily.   [DISCONTINUED] labetalol (NORMODYNE) 300 MG tablet Take 300 mg by mouth 2 (two) times daily.   [DISCONTINUED] meloxicam (MOBIC) 7.5 MG tablet Take 7.5 mg by mouth 2 (two) times daily. prn   [DISCONTINUED] NIFEdipine (ADALAT CC) 60 MG 24 hr tablet Take 60 mg by mouth daily.   gabapentin (NEURONTIN) 300 MG capsule Take 1 capsule (300 mg total) by mouth 3 (three) times daily.   omeprazole (PRILOSEC) 40 MG capsule Take 1 capsule (40 mg total) by mouth 2 (two) times daily for 14 days.   No facility-administered encounter medications on file as of 10/18/2021.    Follow-up: Return in about 4 months (around 02/16/2022) for HTN and CKD.   Lindell Spar, MD

## 2021-10-18 NOTE — Assessment & Plan Note (Signed)
HbA1C: 6.0 Continue to follow low carb diet

## 2021-10-18 NOTE — Assessment & Plan Note (Addendum)
Uses OTC stool softeners

## 2021-10-18 NOTE — Assessment & Plan Note (Signed)
Chronic normocytic anemia, likely due to CKD On iron supplements Followed by nephrology

## 2021-10-27 ENCOUNTER — Other Ambulatory Visit: Payer: Self-pay

## 2021-10-27 ENCOUNTER — Ambulatory Visit: Payer: Medicaid Other

## 2021-10-27 LAB — HM DIABETES EYE EXAM

## 2021-11-02 ENCOUNTER — Telehealth: Payer: Self-pay | Admitting: Internal Medicine

## 2021-11-02 NOTE — Telephone Encounter (Signed)
Called pt and LVM to let know a copy of the [facility] paperwork is at front desk for pick up

## 2021-11-09 NOTE — Telephone Encounter (Signed)
Patient picked up forms.

## 2021-11-16 ENCOUNTER — Telehealth: Payer: Self-pay

## 2021-11-16 NOTE — Telephone Encounter (Signed)
PCS new forms revised with newer version  Copied Sleeved Noted

## 2021-11-18 NOTE — Telephone Encounter (Signed)
Called patient to let him know forms ready to be picked up, asked to fax and hold a copy for patient to pick up.

## 2021-11-27 IMAGING — DX DG CHEST 2V
2 series · 2 of 2 positions shown · non-contrast
Comparison: May 24, 2014

CLINICAL DATA: Shortness of breath

EXAM:
CHEST - 2 VIEW

[chest pa]
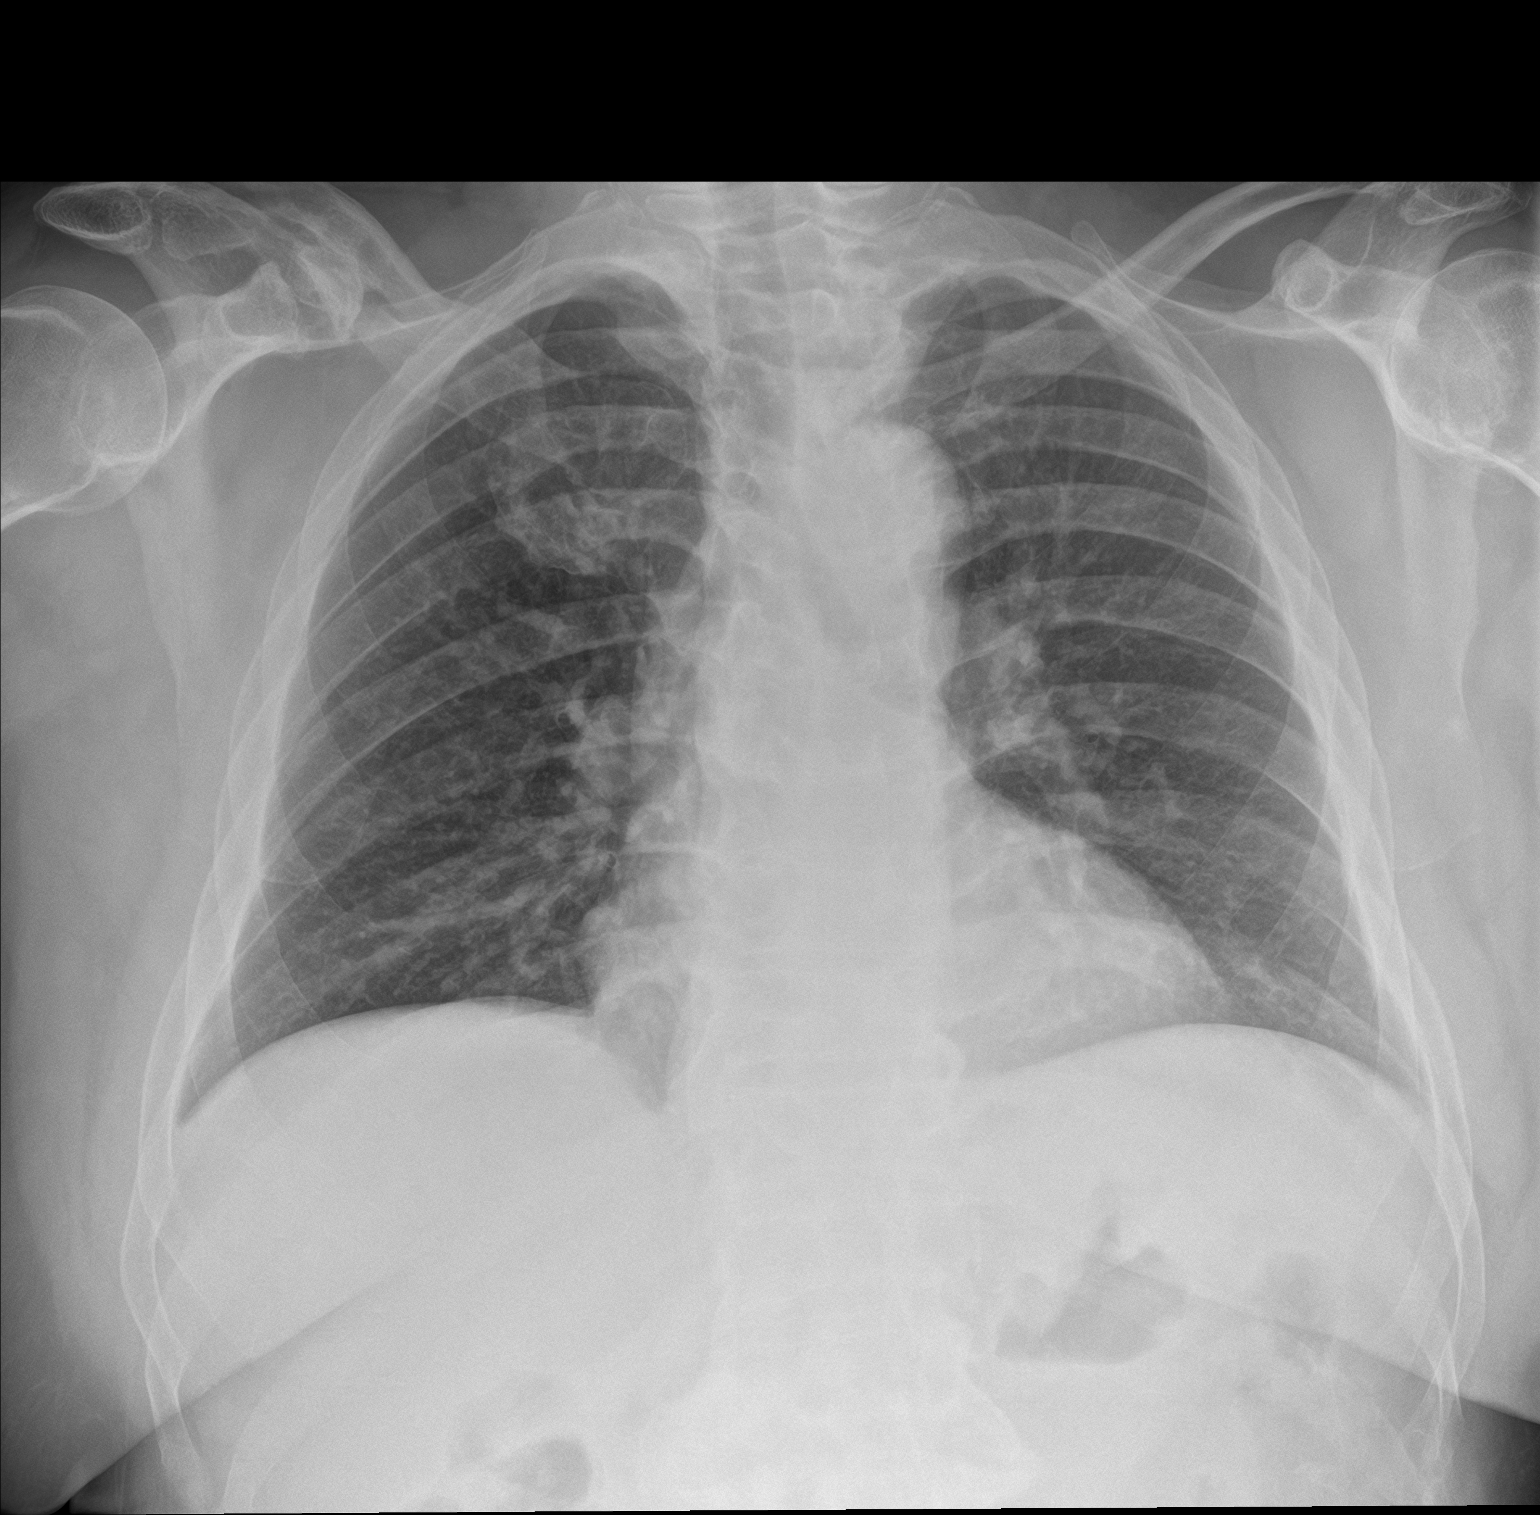

[chest lat]
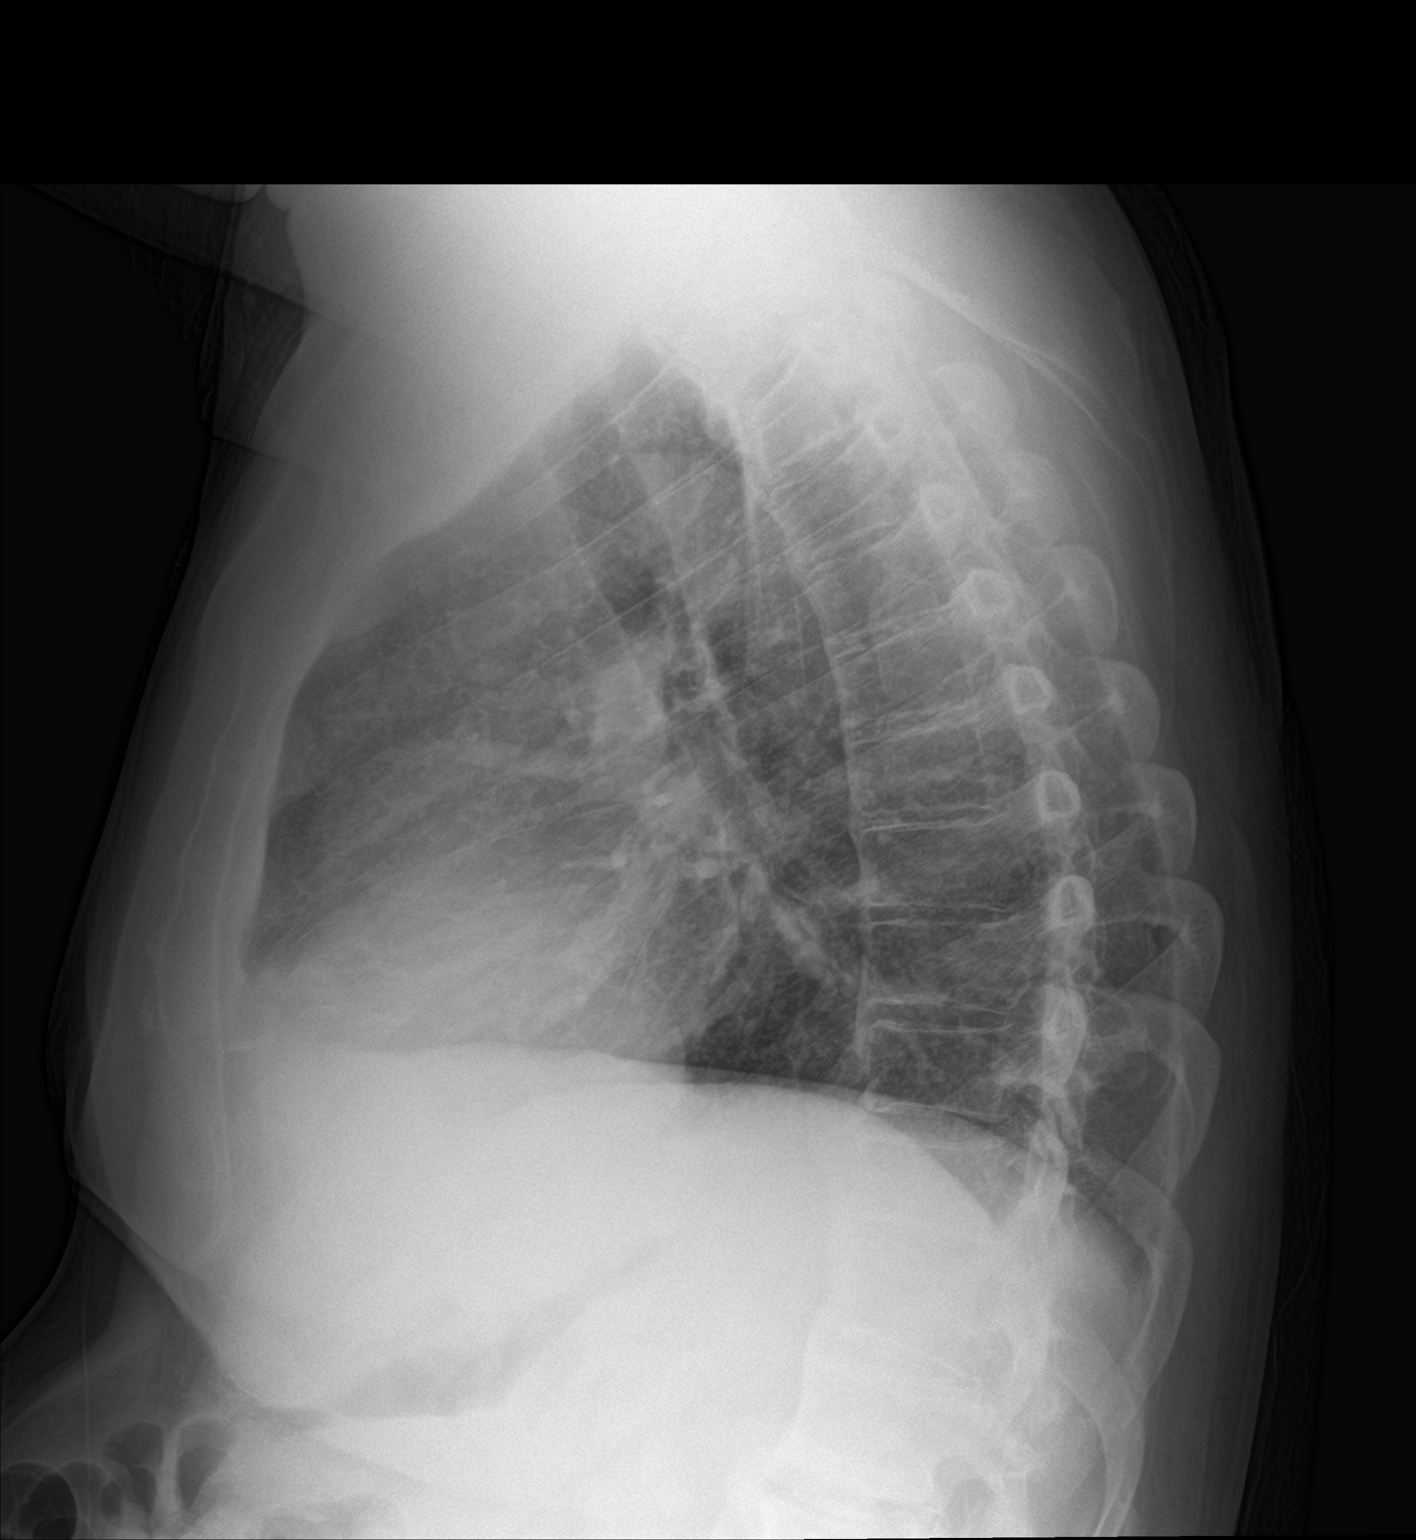

[2 of 2 positions shown; findings below may reference images not displayed]

FINDINGS: The heart size is borderline. The hila and mediastinum are normal.
No pneumothorax. No nodules or masses. No focal infiltrates.
IMPRESSION: No active cardiopulmonary disease.

## 2021-12-07 ENCOUNTER — Other Ambulatory Visit: Payer: Self-pay

## 2021-12-07 ENCOUNTER — Ambulatory Visit (INDEPENDENT_AMBULATORY_CARE_PROVIDER_SITE_OTHER): Payer: Medicaid Other | Admitting: Vascular Surgery

## 2021-12-07 ENCOUNTER — Encounter: Payer: Self-pay | Admitting: Vascular Surgery

## 2021-12-07 VITALS — BP 170/90 | HR 83 | Temp 98.1°F | Resp 18 | Ht 72.0 in | Wt 216.4 lb

## 2021-12-07 DIAGNOSIS — N184 Chronic kidney disease, stage 4 (severe): Secondary | ICD-10-CM

## 2021-12-07 NOTE — H&P (View-Only) (Signed)
[facility] [place]  Patient name: Charles [name] MRN: [id number] DOB: January 27, 1956 Sex: male  REASON FOR CONSULT: Discuss access for hemodialysis  HPI: Charles [name] is a 66 y.o. male, who is here today for discussion of access for hemodialysis.  He has had progressive renal insufficiency most likely related to hypertension.  He is not currently on hemodialysis.  He is here for discussion of access.  He is right-handed.  He does not have a pacemaker.  He has a very poor understanding of his health issues.  I did discuss the significance of his renal failure and expected need for hemodialysis in the intermediate future.  Past Medical History:  Diagnosis Date   Anemia    Anxiety    Chronic back pain    Dementia    Nursing facility feels he has Dementia   Diabetes mellitus    Facial numbness    Fracture of right foot    Hypertension    Mental disorder    schizophrenia    Family History  Problem Relation Age of Onset   Diabetes Brother    Colon cancer Neg Hx    Liver disease Neg Hx        unknown for sure, mom died at age 35    SOCIAL HISTORY: Social History   Socioeconomic History   Marital status: Single    Spouse name: Not on file   Number of children: Not on file   Years of education: Not on file   Highest education level: Not on file  Occupational History   Not on file  Tobacco Use   Smoking status: Every Day    Packs/day: 0.25    Years: 40.00    Pack years: 10.00    Types: Cigarettes   Smokeless tobacco: Never  Vaping Use   Vaping Use: Never used  Substance and Sexual Activity   Alcohol use: Yes    Comment: 16-24 oz occasionally   Drug use: No   Sexual activity: Not on file  Other Topics Concern   Not on file  Social History Narrative   Not on file   Social Determinants of Health   Financial Resource Strain: Not on file  Food Insecurity: Not on file  Transportation Needs: Not on file   Physical Activity: Not on file  Stress: Not on file  Social Connections: Not on file  Intimate Partner Violence: Not on file    No Known Allergies  Current Outpatient Medications  Medication Sig Dispense Refill   acetaminophen (TYLENOL 8 HOUR) 650 MG CR tablet Take 1 tablet (650 mg total) by mouth every 8 (eight) hours as needed for pain or fever. 30 tablet 0   amLODipine (NORVASC) 10 MG tablet Take 10 mg by mouth daily.     aspirin EC 81 MG tablet Take 1 tablet (81 mg total) by mouth daily with breakfast.     Bismuth 262 MG CHEW Chew 2 tablets by mouth every 6 (six) hours. 112 tablet 0   calcitRIOL (ROCALTROL) 0.25 MCG capsule Take 0.25 mcg by mouth 3 (three) times a week.     Cholecalciferol 25 MCG (1000 UT) capsule Take 1,000 Units by mouth daily.     diclofenac Sodium (VOLTAREN) 1 % GEL Apply 2 g topically 4 (four) times daily.     ferrous sulfate 325 (65 FE) MG tablet Take 325 mg by mouth daily with breakfast.     furosemide (LASIX) 40 MG tablet Take 40  mg by mouth 2 (two) times daily.     gabapentin (NEURONTIN) 300 MG capsule Take 1 capsule (300 mg total) by mouth 3 (three) times daily. 90 capsule 2   losartan (COZAAR) 25 MG tablet Take 25 mg by mouth daily.     omeprazole (PRILOSEC) 40 MG capsule Take 1 capsule (40 mg total) by mouth 2 (two) times daily for 14 days. 28 capsule 0   OVER THE COUNTER MEDICATION D 3 5,000 IU once per day. In addition to five 1,000 IU daily.     OVER THE COUNTER MEDICATION Vit D 3 1,000 IU five capsule per day in addition to a 5,000 IU capsule.     pravastatin (PRAVACHOL) 40 MG tablet Take 40 mg by mouth daily.      sodium bicarbonate 650 MG tablet Take 650 mg by mouth 3 (three) times daily.     tamsulosin (FLOMAX) 0.4 MG CAPS capsule Take 0.4 mg by mouth.     No current facility-administered medications for this visit.    REVIEW OF SYSTEMS:  [X]  denotes positive finding, [ ]  denotes negative finding Cardiac  Comments:  Chest pain or chest  pressure:    Shortness of breath upon exertion:    Short of breath when lying flat:    Irregular heart rhythm:        Vascular    Pain in calf, thigh, or hip brought on by ambulation:    Pain in feet at night that wakes you up from your sleep:     Blood clot in your veins:    Leg swelling:         Pulmonary    Oxygen at home:    Productive cough:     Wheezing:         Neurologic    Sudden weakness in arms or legs:     Sudden numbness in arms or legs:     Sudden onset of difficulty speaking or slurred speech:    Temporary loss of vision in one eye:     Problems with dizziness:         Gastrointestinal    Blood in stool:     Vomited blood:         Genitourinary    Burning when urinating:     Blood in urine:        Psychiatric    Major depression:         Hematologic    Bleeding problems:    Problems with blood clotting too easily:        Skin    Rashes or ulcers:        Constitutional    Fever or chills:      PHYSICAL EXAM: Vitals:   12/07/21 0912 12/07/21 0914  BP: (!) 178/87 (!) 170/90  Pulse: 83   Resp: 18   Temp: 98.1 F (36.7 C)   TempSrc: Temporal   SpO2: 95%   Weight: 216 lb 6.4 oz (98.2 kg)   Height: 6' (1.829 m)     GENERAL: The patient is a well-nourished male, in no acute distress. The vital signs are documented above. CARDIOVASCULAR: He has a 2+ radial and 1-2+ ulnar pulses bilaterally.  His radial arteries feel calcified.  He does have prominent cephalic vein throughout his forearm bilaterally.  This takes a course onto the dorsum of his hand on the left. PULMONARY: There is good air exchange  MUSCULOSKELETAL: There are no major deformities or cyanosis. NEUROLOGIC: No  focal weakness or paresthesias are detected. SKIN: There are no ulcers or rashes noted. PSYCHIATRIC: The patient has a normal affect.  DATA:  None  MEDICAL ISSUES: Had long discussion with the patient regarding options for hemodialysis.  Discussed the use of temporary  catheter for immediate hemodialysis which fortunately he does not need.  He does appear to be a candidate for AV fistula creation with very superficial and moderate size cephalic veins bilaterally.  Cephalic vein does run in a very straight course throughout his forearm.  I have recommended left AV fistula as his first dialysis access.  Did explain the potential for failure of this and nonmaturation.  We will plan for outpatient surgery on 12/20/2021 at [place], MD [facility] of [facility] Tel [phone] Pager [phone]  Note: Portions of this report may have been transcribed using voice recognition software.  Every effort has been made to ensure accuracy; however, inadvertent computerized transcription errors may still be present.

## 2021-12-07 NOTE — Progress Notes (Signed)
[facility] and [facility] Specialist of [place]  Patient name: Charles [name] MRN: [id number] DOB: [birth year] Sex: male  REASON FOR CONSULT: Discuss access for hemodialysis  HPI: Charles [name] is a 66 y.o. male, who is here today for discussion of access for hemodialysis.  He has had progressive renal insufficiency most likely related to hypertension.  He is not currently on hemodialysis.  He is here for discussion of access.  He is right-handed.  He does not have a pacemaker.  He has a very poor understanding of his health issues.  I did discuss the significance of his renal failure and expected need for hemodialysis in the intermediate future.  Past Medical History:  Diagnosis Date   Anemia    Anxiety    Chronic back pain    Dementia    Nursing facility feels he has Dementia   Diabetes mellitus    Facial numbness    Fracture of right foot    Hypertension    Mental disorder    schizophrenia    Family History  Problem Relation Age of Onset   Diabetes Brother    Colon cancer Neg Hx    Liver disease Neg Hx        unknown for sure, mom died at age 79    SOCIAL HISTORY: Social History   Socioeconomic History   Marital status: Single    Spouse name: Not on file   Number of children: Not on file   Years of education: Not on file   Highest education level: Not on file  Occupational History   Not on file  Tobacco Use   Smoking status: Every Day    Packs/day: 0.25    Years: 40.00    Pack years: 10.00    Types: Cigarettes   Smokeless tobacco: Never  Vaping Use   Vaping Use: Never used  Substance and Sexual Activity   Alcohol use: Yes    Comment: 16-24 oz occasionally   Drug use: No   Sexual activity: Not on file  Other Topics Concern   Not on file  Social History Narrative   Not on file   Social Determinants of Health   Financial Resource Strain: Not on file  Food Insecurity: Not on file  Transportation Needs: Not on file   Physical Activity: Not on file  Stress: Not on file  Social Connections: Not on file  Intimate Partner Violence: Not on file    No Known Allergies  Current Outpatient Medications  Medication Sig Dispense Refill   acetaminophen (TYLENOL 8 HOUR) 650 MG CR tablet Take 1 tablet (650 mg total) by mouth every 8 (eight) hours as needed for pain or fever. 30 tablet 0   amLODipine (NORVASC) 10 MG tablet Take 10 mg by mouth daily.     aspirin EC 81 MG tablet Take 1 tablet (81 mg total) by mouth daily with breakfast.     Bismuth 262 MG CHEW Chew 2 tablets by mouth every 6 (six) hours. 112 tablet 0   calcitRIOL (ROCALTROL) 0.25 MCG capsule Take 0.25 mcg by mouth 3 (three) times a week.     Cholecalciferol 25 MCG (1000 UT) capsule Take 1,000 Units by mouth daily.     diclofenac Sodium (VOLTAREN) 1 % GEL Apply 2 g topically 4 (four) times daily.     ferrous sulfate 325 (65 FE) MG tablet Take 325 mg by mouth daily with breakfast.     furosemide (LASIX) 40 MG tablet Take 40  mg by mouth 2 (two) times daily.     gabapentin (NEURONTIN) 300 MG capsule Take 1 capsule (300 mg total) by mouth 3 (three) times daily. 90 capsule 2   losartan (COZAAR) 25 MG tablet Take 25 mg by mouth daily.     omeprazole (PRILOSEC) 40 MG capsule Take 1 capsule (40 mg total) by mouth 2 (two) times daily for 14 days. 28 capsule 0   OVER THE COUNTER MEDICATION D 3 5,000 IU once per day. In addition to five 1,000 IU daily.     OVER THE COUNTER MEDICATION Vit D 3 1,000 IU five capsule per day in addition to a 5,000 IU capsule.     pravastatin (PRAVACHOL) 40 MG tablet Take 40 mg by mouth daily.      sodium bicarbonate 650 MG tablet Take 650 mg by mouth 3 (three) times daily.     tamsulosin (FLOMAX) 0.4 MG CAPS capsule Take 0.4 mg by mouth.     No current facility-administered medications for this visit.    REVIEW OF SYSTEMS:  [X]  denotes positive finding, [ ]  denotes negative finding Cardiac  Comments:  Chest pain or chest  pressure:    Shortness of breath upon exertion:    Short of breath when lying flat:    Irregular heart rhythm:        Vascular    Pain in calf, thigh, or hip brought on by ambulation:    Pain in feet at night that wakes you up from your sleep:     Blood clot in your veins:    Leg swelling:         Pulmonary    Oxygen at home:    Productive cough:     Wheezing:         Neurologic    Sudden weakness in arms or legs:     Sudden numbness in arms or legs:     Sudden onset of difficulty speaking or slurred speech:    Temporary loss of vision in one eye:     Problems with dizziness:         Gastrointestinal    Blood in stool:     Vomited blood:         Genitourinary    Burning when urinating:     Blood in urine:        Psychiatric    Major depression:         Hematologic    Bleeding problems:    Problems with blood clotting too easily:        Skin    Rashes or ulcers:        Constitutional    Fever or chills:      PHYSICAL EXAM: Vitals:   12/07/21 0912 12/07/21 0914  BP: (!) 178/87 (!) 170/90  Pulse: 83   Resp: 18   Temp: 98.1 F (36.7 C)   TempSrc: Temporal   SpO2: 95%   Weight: 216 lb 6.4 oz (98.2 kg)   Height: 6' (1.829 m)     GENERAL: The patient is a well-nourished male, in no acute distress. The vital signs are documented above. CARDIOVASCULAR: He has a 2+ radial and 1-2+ ulnar pulses bilaterally.  His radial arteries feel calcified.  He does have prominent cephalic vein throughout his forearm bilaterally.  This takes a course onto the dorsum of his hand on the left. PULMONARY: There is good air exchange  MUSCULOSKELETAL: There are no major deformities or cyanosis. NEUROLOGIC: No  focal weakness or paresthesias are detected. SKIN: There are no ulcers or rashes noted. PSYCHIATRIC: The patient has a normal affect.  DATA:  None  MEDICAL ISSUES: Had long discussion with the patient regarding options for hemodialysis.  Discussed the use of temporary  catheter for immediate hemodialysis which fortunately he does not need.  He does appear to be a candidate for AV fistula creation with very superficial and moderate size cephalic veins bilaterally.  Cephalic vein does run in a very straight course throughout his forearm.  I have recommended left AV fistula as his first dialysis access.  Did explain the potential for failure of this and nonmaturation.  We will plan for outpatient surgery on 12/20/2021 at [place], MD [facility] V[facility] and Vein Specialists of [facility] Tel [phone] Pager [phone]  Note: Portions of this report may have been transcribed using voice recognition software.  Every effort has been made to ensure accuracy; however, inadvertent computerized transcription errors may still be present.

## 2021-12-08 ENCOUNTER — Other Ambulatory Visit: Payer: Self-pay

## 2021-12-16 ENCOUNTER — Encounter (HOSPITAL_COMMUNITY)
Admission: RE | Admit: 2021-12-16 | Discharge: 2021-12-16 | Disposition: A | Discharge status: Home / Self Care | Payer: Medicaid Other | Source: Ambulatory Visit | Attending: Vascular Surgery | Admitting: Vascular Surgery

## 2021-12-20 ENCOUNTER — Ambulatory Visit (HOSPITAL_COMMUNITY)
Admission: RE | Admit: 2021-12-20 | Discharge: 2021-12-20 | Disposition: A | Discharge status: Home / Self Care | Payer: Medicaid Other | Attending: Vascular Surgery | Admitting: Vascular Surgery

## 2021-12-20 ENCOUNTER — Ambulatory Visit (HOSPITAL_BASED_OUTPATIENT_CLINIC_OR_DEPARTMENT_OTHER): Payer: Medicaid Other | Admitting: Anesthesiology

## 2021-12-20 ENCOUNTER — Ambulatory Visit (HOSPITAL_COMMUNITY): Payer: Medicaid Other | Admitting: Anesthesiology

## 2021-12-20 ENCOUNTER — Other Ambulatory Visit: Payer: Self-pay

## 2021-12-20 ENCOUNTER — Encounter (HOSPITAL_COMMUNITY)
Admission: RE | Disposition: A | Discharge status: Home / Self Care | Payer: Self-pay | Source: Home / Self Care | Attending: Vascular Surgery

## 2021-12-20 DIAGNOSIS — D631 Anemia in chronic kidney disease: Secondary | ICD-10-CM | POA: Diagnosis not present

## 2021-12-20 DIAGNOSIS — N185 Chronic kidney disease, stage 5: Secondary | ICD-10-CM

## 2021-12-20 DIAGNOSIS — E1122 Type 2 diabetes mellitus with diabetic chronic kidney disease: Secondary | ICD-10-CM | POA: Diagnosis not present

## 2021-12-20 DIAGNOSIS — F17210 Nicotine dependence, cigarettes, uncomplicated: Secondary | ICD-10-CM

## 2021-12-20 DIAGNOSIS — N184 Chronic kidney disease, stage 4 (severe): Secondary | ICD-10-CM

## 2021-12-20 DIAGNOSIS — N189 Chronic kidney disease, unspecified: Secondary | ICD-10-CM | POA: Diagnosis not present

## 2021-12-20 DIAGNOSIS — I129 Hypertensive chronic kidney disease with stage 1 through stage 4 chronic kidney disease, or unspecified chronic kidney disease: Secondary | ICD-10-CM | POA: Diagnosis not present

## 2021-12-20 DIAGNOSIS — I120 Hypertensive chronic kidney disease with stage 5 chronic kidney disease or end stage renal disease: Secondary | ICD-10-CM

## 2021-12-20 HISTORY — PX: AV FISTULA PLACEMENT: SHX1204

## 2021-12-20 LAB — POCT I-STAT, CHEM 8
BUN: 43 mg/dL — ABNORMAL HIGH (ref 8–23)
Calcium, Ion: 1.18 mmol/L (ref 1.15–1.40)
Chloride: 114 mmol/L — ABNORMAL HIGH (ref 98–111)
Creatinine, Ser: 4.1 mg/dL — ABNORMAL HIGH (ref 0.61–1.24)
Glucose, Bld: 74 mg/dL (ref 70–99)
HCT: 37 % — ABNORMAL LOW (ref 39.0–52.0)
Hemoglobin: 12.6 g/dL — ABNORMAL LOW (ref 13.0–17.0)
Potassium: 4.6 mmol/L (ref 3.5–5.1)
Sodium: 141 mmol/L (ref 135–145)
TCO2: 19 mmol/L — ABNORMAL LOW (ref 22–32)

## 2021-12-20 SURGERY — ARTERIOVENOUS (AV) FISTULA CREATION
Anesthesia: General | Site: Arm Lower | Laterality: Left

## 2021-12-20 MED ORDER — FENTANYL CITRATE (PF) 100 MCG/2ML IJ SOLN
INTRAMUSCULAR | Status: DC | PRN
  Administered 2021-12-20 (×2): 50 ug via INTRAVENOUS

## 2021-12-20 MED ORDER — LIDOCAINE 2% (20 MG/ML) 5 ML SYRINGE
INTRAMUSCULAR | Status: DC | PRN
Start: 2021-12-20 — End: 2021-12-20
  Administered 2021-12-20: 50 mg via INTRAVENOUS

## 2021-12-20 MED ORDER — DEXAMETHASONE SODIUM PHOSPHATE 4 MG/ML IJ SOLN
INTRAMUSCULAR | Status: DC | PRN
  Administered 2021-12-20: 4 mg via INTRAVENOUS

## 2021-12-20 MED ORDER — PROPOFOL 10 MG/ML IV BOLUS
INTRAVENOUS | Status: AC
  Filled 2021-12-20: qty 20

## 2021-12-20 MED ORDER — CHLORHEXIDINE GLUCONATE 0.12 % MT SOLN
15.0000 mL | Freq: Once | OROMUCOSAL | Status: AC
  Administered 2021-12-20: 15 mL via OROMUCOSAL
  Filled 2021-12-20: qty 15

## 2021-12-20 MED ORDER — FENTANYL CITRATE (PF) 100 MCG/2ML IJ SOLN
INTRAMUSCULAR | Status: AC
  Filled 2021-12-20: qty 2

## 2021-12-20 MED ORDER — PROPOFOL 500 MG/50ML IV EMUL
INTRAVENOUS | Status: DC | PRN
Start: 2021-12-20 — End: 2021-12-20
  Administered 2021-12-20: 50 ug/kg/min via INTRAVENOUS

## 2021-12-20 MED ORDER — SODIUM CHLORIDE 0.9 % IV SOLN: INTRAVENOUS | Status: DC

## 2021-12-20 MED ORDER — HEPARIN 6000 UNIT IRRIGATION SOLUTION
Status: DC | PRN
  Administered 2021-12-20: 1

## 2021-12-20 MED ORDER — CEFAZOLIN SODIUM-DEXTROSE 2-4 GM/100ML-% IV SOLN
2.0000 g | INTRAVENOUS | Status: AC
  Administered 2021-12-20: 2 g via INTRAVENOUS
  Filled 2021-12-20: qty 100

## 2021-12-20 MED ORDER — ONDANSETRON HCL 4 MG/2ML IJ SOLN
INTRAMUSCULAR | Status: DC | PRN
Start: 2021-12-20 — End: 2021-12-20
  Administered 2021-12-20: 4 mg via INTRAVENOUS

## 2021-12-20 MED ORDER — CHLORHEXIDINE GLUCONATE 4 % EX LIQD: 60.0000 mL | Freq: Once | CUTANEOUS | Status: DC

## 2021-12-20 MED ORDER — KETAMINE HCL 50 MG/5ML IJ SOSY
PREFILLED_SYRINGE | INTRAMUSCULAR | Status: AC
  Filled 2021-12-20: qty 5

## 2021-12-20 MED ORDER — 0.9 % SODIUM CHLORIDE (POUR BTL) OPTIME
TOPICAL | Status: DC | PRN
  Administered 2021-12-20: 1000 mL

## 2021-12-20 MED ORDER — ORAL CARE MOUTH RINSE: 15.0000 mL | Freq: Once | OROMUCOSAL | Status: AC

## 2021-12-20 MED ORDER — GLYCOPYRROLATE PF 0.2 MG/ML IJ SOSY
PREFILLED_SYRINGE | INTRAMUSCULAR | Status: AC
  Filled 2021-12-20: qty 1

## 2021-12-20 MED ORDER — LIDOCAINE-EPINEPHRINE 0.5 %-1:200000 IJ SOLN
INTRAMUSCULAR | Status: AC
  Filled 2021-12-20: qty 1

## 2021-12-20 MED ORDER — HEPARIN SODIUM (PORCINE) 1000 UNIT/ML IJ SOLN
INTRAMUSCULAR | Status: AC
  Filled 2021-12-20: qty 6

## 2021-12-20 MED ORDER — DEXAMETHASONE SODIUM PHOSPHATE 10 MG/ML IJ SOLN
INTRAMUSCULAR | Status: AC
  Filled 2021-12-20: qty 1

## 2021-12-20 MED ORDER — KETAMINE HCL 10 MG/ML IJ SOLN
INTRAMUSCULAR | Status: DC | PRN
  Administered 2021-12-20: 20 mg via INTRAVENOUS

## 2021-12-20 MED ORDER — LIDOCAINE HCL (PF) 2 % IJ SOLN
INTRAMUSCULAR | Status: AC
  Filled 2021-12-20: qty 5

## 2021-12-20 MED ORDER — PHENYLEPHRINE 40 MCG/ML (10ML) SYRINGE FOR IV PUSH (FOR BLOOD PRESSURE SUPPORT)
PREFILLED_SYRINGE | INTRAVENOUS | Status: AC
  Filled 2021-12-20: qty 10

## 2021-12-20 MED ORDER — LIDOCAINE-EPINEPHRINE 0.5 %-1:200000 IJ SOLN
INTRAMUSCULAR | Status: DC | PRN
  Administered 2021-12-20: 11 mL

## 2021-12-20 MED ORDER — OXYCODONE-ACETAMINOPHEN 5-325 MG PO TABS: 1.0000 | ORAL_TABLET | Freq: Four times a day (QID) | ORAL | 0 refills | Status: DC | PRN

## 2021-12-20 MED ORDER — ONDANSETRON HCL 4 MG/2ML IJ SOLN
INTRAMUSCULAR | Status: AC
  Filled 2021-12-20: qty 2

## 2021-12-20 MED ORDER — PROPOFOL 10 MG/ML IV BOLUS
INTRAVENOUS | Status: DC | PRN
  Administered 2021-12-20: 20 mg via INTRAVENOUS
  Administered 2021-12-20: 30 mg via INTRAVENOUS

## 2021-12-20 SURGICAL SUPPLY — 37 items
ADH SKN CLS APL DERMABOND .7 (GAUZE/BANDAGES/DRESSINGS) ×1
ARMBAND PINK RESTRICT EXTREMIT (MISCELLANEOUS) ×2 IMPLANT
BAG HAMPER (MISCELLANEOUS) ×2 IMPLANT
CANNULA VESSEL 3MM 2 BLNT TIP (CANNULA) ×3 IMPLANT
CLIP LIGATING EXTRA MED SLVR (CLIP) ×2 IMPLANT
CLIP LIGATING EXTRA SM BLUE (MISCELLANEOUS) ×2 IMPLANT
COVER LIGHT HANDLE STERIS (MISCELLANEOUS) ×4 IMPLANT
COVER MAYO STAND XLG (MISCELLANEOUS) ×2 IMPLANT
DERMABOND ADVANCED (GAUZE/BANDAGES/DRESSINGS) ×1
DERMABOND ADVANCED .7 DNX12 (GAUZE/BANDAGES/DRESSINGS) ×1 IMPLANT
ELECT REM PT RETURN 9FT ADLT (ELECTROSURGICAL) ×2
ELECTRODE REM PT RTRN 9FT ADLT (ELECTROSURGICAL) ×1 IMPLANT
GAUZE SPONGE 4X4 12PLY STRL (GAUZE/BANDAGES/DRESSINGS) ×2 IMPLANT
GLOVE SURG MICRO LTX SZ7.5 (GLOVE) ×2 IMPLANT
GLOVE SURG POLYISO LF SZ7 (GLOVE) ×1 IMPLANT
GLOVE SURG UNDER POLY LF SZ7 (GLOVE) ×7 IMPLANT
GOWN STRL REUS W/TWL LRG LVL3 (GOWN DISPOSABLE) ×6 IMPLANT
IV NS 500ML (IV SOLUTION) ×2
IV NS 500ML BAXH (IV SOLUTION) ×1 IMPLANT
KIT BLADEGUARD II DBL (SET/KITS/TRAYS/PACK) ×2 IMPLANT
KIT TURNOVER KIT A (KITS) ×2 IMPLANT
MANIFOLD NEPTUNE II (INSTRUMENTS) ×2 IMPLANT
MARKER SKIN DUAL TIP RULER LAB (MISCELLANEOUS) ×4 IMPLANT
NDL HYPO 25X1 1.5 SAFETY (NEEDLE) IMPLANT
NEEDLE HYPO 25X1 1.5 SAFETY (NEEDLE) ×2 IMPLANT
NS IRRIG 1000ML POUR BTL (IV SOLUTION) ×2 IMPLANT
PACK CV ACCESS (CUSTOM PROCEDURE TRAY) ×2 IMPLANT
PAD ARMBOARD 7.5X6 YLW CONV (MISCELLANEOUS) ×2 IMPLANT
SET BASIN LINEN APH (SET/KITS/TRAYS/PACK) ×2 IMPLANT
SOL PREP POV-IOD 4OZ 10% (MISCELLANEOUS) ×2 IMPLANT
SOL PREP PROV IODINE SCRUB 4OZ (MISCELLANEOUS) ×2 IMPLANT
SPONGE T-LAP 18X18 ~~LOC~~+RFID (SPONGE) ×2 IMPLANT
SUT PROLENE 6 0 CC (SUTURE) ×2 IMPLANT
SUT VIC AB 3-0 SH 27 (SUTURE) ×4
SUT VIC AB 3-0 SH 27X BRD (SUTURE) ×1 IMPLANT
SYR 10ML LL (SYRINGE) ×2 IMPLANT
UNDERPAD 30X36 HEAVY ABSORB (UNDERPADS AND DIAPERS) ×2 IMPLANT

## 2021-12-20 NOTE — Transfer of Care (Signed)
Immediate Anesthesia Transfer of Care Note  Patient: Charles [name]  Procedure(s) Performed: LEFT ARM ARTERIOVENOUS (AV) FISTULA VERSUS ARTERIOVENOUS GRAFT CREATION (Left: Arm Lower)  Patient Location: PACU  Anesthesia Type:General  Level of Consciousness: awake, alert  and oriented  Airway & Oxygen Therapy: Patient Spontanous Breathing  Post-op Assessment: Report given to RN and Post -op Vital signs reviewed and stable  Post vital signs: Reviewed and stable  Last Vitals:  Vitals Value Taken Time  BP    Temp    Pulse    Resp    SpO2      Last Pain:  Vitals:   12/20/21 0829  TempSrc: Oral  PainSc: 0-No pain      Patients Stated Pain Goal: 6 (68/16/61 9694)  Complications: No notable events documented.

## 2021-12-20 NOTE — Interval H&P Note (Signed)
History and Physical Interval Note:  12/20/2021 8:45 AM  Charles [name]  has presented today for surgery, with the diagnosis of CKD IV.  The various methods of treatment have been discussed with the patient and family. After consideration of risks, benefits and other options for treatment, the patient has consented to  Procedure(s): LEFT ARM ARTERIOVENOUS (AV) FISTULA VERSUS ARTERIOVENOUS GRAFT CREATION (Left) as a surgical intervention.  The patient's history has been reviewed, patient examined, no change in status, stable for surgery.  I have reviewed the patient's chart and labs.  Questions were answered to the patient's satisfaction.     Curt Jews

## 2021-12-20 NOTE — Anesthesia Postprocedure Evaluation (Signed)
Anesthesia Post Note  Patient: Charles [name]  Procedure(s) Performed: LEFT ARM ARTERIOVENOUS (AV) FISTULA VERSUS ARTERIOVENOUS GRAFT CREATION (Left: Arm Lower)  Patient location during evaluation: Phase II Anesthesia Type: General Level of consciousness: awake and alert and oriented Pain management: pain level controlled Vital Signs Assessment: post-procedure vital signs reviewed and stable Respiratory status: spontaneous breathing, nonlabored ventilation and respiratory function stable Cardiovascular status: blood pressure returned to baseline and stable Postop Assessment: no apparent nausea or vomiting Anesthetic complications: no   No notable events documented.   Last Vitals:  Vitals:   12/20/21 1115 12/20/21 1124  BP: (!) 153/94 (!) 175/94  Pulse: 65 69  Resp: 14   Temp:  36.5 C  SpO2: 100% 98%    Last Pain:  Vitals:   12/20/21 1124  TempSrc: Oral  PainSc: 3                  Gordana Kewley C Elliyah Liszewski

## 2021-12-20 NOTE — Discharge Instructions (Addendum)
Vascular and Vein Specialists of [facility]  Discharge Instructions  AV Fistula or Graft Surgery for Dialysis Access  Please refer to the following instructions for your post-procedure care. Your surgeon or physician assistant will discuss any changes with you.  Activity  You may drive the day following your surgery, if you are comfortable and no longer taking prescription pain medication. Resume full activity as the soreness in your incision resolves.  Bathing/Showering  You may shower after you go home. Keep your incision dry for 48 hours. Do not soak in a bathtub, hot tub, or swim until the incision heals completely. You may not shower if you have a hemodialysis catheter.  Incision Care  Clean your incision with mild soap and water after 48 hours. Pat the area dry with a clean towel. You do not need a bandage unless otherwise instructed. Do not apply any ointments or creams to your incision. You may have skin glue on your incision. Do not peel it off. It will come off on its own in about one week. Your arm may swell a bit after surgery. To reduce swelling use pillows to elevate your arm so it is above your heart. Your doctor will tell you if you need to lightly wrap your arm with an ACE bandage.  Diet  Resume your normal diet. There are not special food restrictions following this procedure. In order to heal from your surgery, it is CRITICAL to get adequate nutrition. Your body requires vitamins, minerals, and protein. Vegetables are the best source of vitamins and minerals. Vegetables also provide the perfect balance of protein. Processed food has little nutritional value, so try to avoid this.  Medications  Resume taking all of your medications. If your incision is causing pain, you may take over-the counter pain relievers such as acetaminophen (Tylenol). If you were prescribed a stronger pain medication, please be aware these medications can cause nausea and constipation. Prevent  nausea by taking the medication with a snack or meal. Avoid constipation by drinking plenty of fluids and eating foods with high amount of fiber, such as fruits, vegetables, and grains.  Do not take Tylenol if you are taking prescription pain medications.  Follow up Your surgeon may want to see you in the office following your access surgery. If so, this will be arranged at the time of your surgery.  Please call us immediately for any of the following conditions:  Increased pain, redness, drainage (pus) from your incision site Fever of 101 degrees or higher Severe or worsening pain at your incision site Hand pain or numbness.  Reduce your risk of vascular disease:  Stop smoking. If you would like help, call [facility] at 1-800-QUIT-NOW 470-874-4947) or [place] at [place] your cholesterol Maintain a desired weight Control your diabetes Keep your blood pressure down  Dialysis  It will take several weeks to several months for your new dialysis access to be ready for use. Your surgeon will determine when it is okay to use it. Your nephrologist will continue to direct your dialysis. You can continue to use your Permcath until your new access is ready for use.   12/20/2021 Charles [name] [id number] 1956-08-29  Surgeon(s): Early, Arvilla Meres, MD  Procedure(s): LEFT ARM ARTERIOVENOUS (AV) FISTULA VERSUS ARTERIOVENOUS GRAFT CREATION   May stick graft immediately   May stick graft on designated area only:    Do not stick fistula for 12 weeks    If you have any questions, please call the  office at [phone].

## 2021-12-20 NOTE — Op Note (Signed)
° ° °  OPERATIVE REPORT  DATE OF SURGERY: 12/20/2021  PATIENT: Charles [name], 66 y.o. male MRN: [id number]  DOB: 06/01/1956  PRE-OPERATIVE DIAGNOSIS: Chronic renal insufficiency  POST-OPERATIVE DIAGNOSIS:  Same  PROCEDURE: Left radiocephalic AV fistula creation  SURGEON:  Curt Jews, M.D.  PHYSICIAN ASSISTANT: Fulton Mole, RNFA  The assistant was needed for exposure and to expedite the case  ANESTHESIA: Local with sedation  EBL: per anesthesia record  Total I/O In: 500 [I.V.:400; IV Piggyback:100] Out: -   BLOOD ADMINISTERED: none  DRAINS: none  SPECIMEN: none  COUNTS CORRECT:  YES  PATIENT DISPOSITION:  PACU - hemodynamically stable  PROCEDURE DETAILS: The patient was taken to operating placed supine position where the area of the left arm prepped draped in sterile fashion.  Patient had a moderate size cephalic vein throughout the forearm.  The vein did branch onto the dorsum of his wrist.  Using local anesthesia incision was made over the cephalic vein on the wrist and proximal hand.  There were several tributary branches and these were ligated and divided.  The vein was ligated distally and was gently dilated and was of excellent caliber.  Separate incision was made using local anesthesia over the radial artery.  The radial artery is of normal size but had extreme calcification.  The artery was encircled with a blue vessel loop proximally distally for control.  A tunnel was created between the 2 incisions and the vein was brought to the level of the radial artery.  The radial artery was occluded proximally distally by applying tension to the Vesseloops.  The artery was opened with an 11 blade and sent longitudinally with Potts scissors.  There was a normal size lumen but the artery was extremely circumferentially calcified.  The cephalic vein was cut to the appropriate length and was spatulated and sewn end-to-side to the artery with a running 6-0 Prolene suture.  Prior to  completion of the closure the usual flushing maneuvers were undertaken.  Anastomosis was then completed and good flow was noted through the cephalic vein fistula.  The wounds were irrigated with saline.  Hemostasis was obtained electrocautery.  The wounds were closed with 3-0 Vicryl in the subcutaneous and subcuticular tissue.  Dermabond was applied and the patient was transferred to the recovery room in stable condition   Rosetta Posner, M.D., [facility] 12/20/2021 10:52 AM  Note: Portions of this report may have been transcribed using voice recognition software.  Every effort has been made to ensure accuracy; however, inadvertent computerized transcription errors may still be present.

## 2021-12-20 NOTE — Anesthesia Preprocedure Evaluation (Addendum)
Anesthesia Evaluation  Patient identified by MRN, date of birth, ID band Patient awake    Reviewed: Allergy & Precautions, NPO status , Patient's Chart, lab work & pertinent test results  Airway Mallampati: I  TM Distance: >3 FB Neck ROM: Full    Dental  (+) Dental Advisory Given, Missing, Loose, Chipped,    Pulmonary Current Smoker and Patient abstained from smoking.,    Pulmonary exam normal breath sounds clear to auscultation       Cardiovascular Exercise Tolerance: Good hypertension, Pt. on medications Normal cardiovascular exam Rhythm:Regular Rate:Normal     Neuro/Psych PSYCHIATRIC DISORDERS Anxiety Dementia  Neuromuscular disease    GI/Hepatic negative GI ROS, Neg liver ROS,   Endo/Other  diabetes (not on meds)  Renal/GU Renal Insufficiency and CRFRenal disease  negative genitourinary   Musculoskeletal negative musculoskeletal ROS (+)   Abdominal   Peds negative pediatric ROS (+)  Hematology  (+) Blood dyscrasia, anemia ,   Anesthesia Other Findings   Reproductive/Obstetrics negative OB ROS                            Anesthesia Physical Anesthesia Plan  ASA: 3  Anesthesia Plan: General   Post-op Pain Management: Dilaudid IV   Induction: Intravenous  PONV Risk Score and Plan: 2 and Ondansetron  Airway Management Planned: Nasal Cannula, Natural Airway and Simple Face Mask  Additional Equipment:   Intra-op Plan:   Post-operative Plan:   Informed Consent: I have reviewed the patients History and Physical, chart, labs and discussed the procedure including the risks, benefits and alternatives for the proposed anesthesia with the patient or authorized representative who has indicated his/her understanding and acceptance.     Dental advisory given  Plan Discussed with: CRNA and Surgeon  Anesthesia Plan Comments:         Anesthesia Quick Evaluation

## 2021-12-20 NOTE — Anesthesia Procedure Notes (Signed)
Date/Time: 12/20/2021 9:41 AM Performed by: Orlie Dakin, CRNA Pre-anesthesia Checklist: Patient identified, Emergency Drugs available, Suction available and Patient being monitored Patient Re-evaluated:Patient Re-evaluated prior to induction Oxygen Delivery Method: Non-rebreather mask Induction Type: IV induction Placement Confirmation: positive ETCO2

## 2021-12-22 ENCOUNTER — Encounter (HOSPITAL_COMMUNITY): Payer: Self-pay | Admitting: Vascular Surgery

## 2022-01-18 ENCOUNTER — Encounter: Payer: Self-pay | Admitting: Vascular Surgery

## 2022-01-18 ENCOUNTER — Ambulatory Visit (INDEPENDENT_AMBULATORY_CARE_PROVIDER_SITE_OTHER): Payer: Medicaid Other | Admitting: Vascular Surgery

## 2022-01-18 ENCOUNTER — Other Ambulatory Visit: Payer: Self-pay

## 2022-01-18 VITALS — BP 213/93 | HR 73 | Temp 97.7°F | Resp 18 | Ht 72.0 in | Wt 218.8 lb

## 2022-01-18 DIAGNOSIS — N184 Chronic kidney disease, stage 4 (severe): Secondary | ICD-10-CM

## 2022-01-18 NOTE — Progress Notes (Signed)
? ?[facility] [place] ? ?Patient name: Charles [name] MRN: [id number] DOB: [birth year] Sex: male ? ?REASON FOR VISIT: Follow-up left radiocephalic AV fistula creation ? ?HPI: ?Charles [name] is a 66 y.o. male here today for follow-up of anemia cephalic fistula creation on 12/20/2021.  Has had no difficulty with healing and has no steal symptoms ? ?Current Outpatient Medications  ?Medication Sig Dispense Refill  ? acetaminophen (TYLENOL 8 HOUR) 650 MG CR tablet Take 1 tablet (650 mg total) by mouth every 8 (eight) hours as needed for pain or fever. 30 tablet 0  ? amLODipine (NORVASC) 10 MG tablet Take 10 mg by mouth daily.    ? aspirin EC 81 MG tablet Take 1 tablet (81 mg total) by mouth daily with breakfast.    ? Bismuth 262 MG CHEW Chew 2 tablets by mouth every 6 (six) hours. 112 tablet 0  ? calcitRIOL (ROCALTROL) 0.25 MCG capsule Take 0.25 mcg by mouth 3 (three) times a week.    ? Cholecalciferol 25 MCG (1000 UT) capsule Take 1,000 Units by mouth daily.    ? diclofenac Sodium (VOLTAREN) 1 % GEL Apply 2 g topically 4 (four) times daily.    ? ferrous sulfate 325 (65 FE) MG tablet Take 325 mg by mouth daily with breakfast.    ? furosemide (LASIX) 40 MG tablet Take 40 mg by mouth 2 (two) times daily.    ? gabapentin (NEURONTIN) 300 MG capsule Take 1 capsule (300 mg total) by mouth 3 (three) times daily. 90 capsule 2  ? losartan (COZAAR) 25 MG tablet Take 25 mg by mouth daily.    ? omeprazole (PRILOSEC) 40 MG capsule Take 1 capsule (40 mg total) by mouth 2 (two) times daily for 14 days. 28 capsule 0  ? OVER THE COUNTER MEDICATION D 3 5,000 IU once per day. In addition to five 1,000 IU daily.    ? OVER THE COUNTER MEDICATION Vit D 3 1,000 IU five capsule per day in addition to a 5,000 IU capsule.    ? oxyCODONE-acetaminophen (PERCOCET) 5-325 MG tablet Take 1 tablet by mouth every 6 (six) hours as needed for severe pain. 8 tablet 0  ? pravastatin (PRAVACHOL) 40 MG  tablet Take 40 mg by mouth daily.     ? sodium bicarbonate 650 MG tablet Take 650 mg by mouth 3 (three) times daily.    ? tamsulosin (FLOMAX) 0.4 MG CAPS capsule Take 0.4 mg by mouth.    ? ?No current facility-administered medications for this visit.  ? ? ? ?PHYSICAL EXAM: ?Vitals:  ? 01/18/22 1313  ?BP: (!) 213/93  ?Pulse: 73  ?Resp: 18  ?Temp: 97.7 ?F (36.5 ?C)  ?TempSrc: Temporal  ?SpO2: 98%  ?Weight: 218 lb 12.8 oz (99.2 kg)  ?Height: 6' (1.829 m)  ? ? ?GENERAL: The patient is a well-nourished male, in no acute distress. The vital signs are documented above. ?Good healing of his incision of the radiocephalic level.  Excellent Deryn Massengale maturation of his fistula.  He has an excellent thrill.  The vein is a very good size and is very superficial and runs in a very straight course. ? ?MEDICAL ISSUES: ?Good Mariabelen Pressly result from AV fistula creation on 12/20/2021.  He still has not progressed to end-stage renal disease.  I feel that he has a very high likelihood that this will be successful for dialysis and would wait until a total of 3 months until 03-19-2022 prior to using the fistula.  He  will see [place] again on an as-needed basis ? ? ?Rosetta Posner, MD FACS ?[facility] [place] ?Office Tel [phone] ? ?Note: Portions of this report may have been transcribed using voice recognition software.  Every effort has been made to ensure accuracy; however, inadvertent computerized transcription errors may still be present. ?

## 2022-01-23 ENCOUNTER — Ambulatory Visit (INDEPENDENT_AMBULATORY_CARE_PROVIDER_SITE_OTHER): Payer: Medicaid Other | Admitting: Podiatry

## 2022-01-23 DIAGNOSIS — Z91199 Patient's noncompliance with other medical treatment and regimen due to unspecified reason: Secondary | ICD-10-CM

## 2022-01-27 NOTE — Progress Notes (Signed)
   Complete physical exam  Patient: Charles [name]   DOB: 08/19/1997   66 y.o. Male  MRN: [id number]  Subjective:    No chief complaint on file.   Charles [name] is a 66 y.o. male who presents today for a complete physical exam. She reports consuming a {diet types:17450} diet. {types:19826} She generally feels {DESC; WELL/FAIRLY WELL/POORLY:18703}. She reports sleeping {DESC; WELL/FAIRLY WELL/POORLY:18703}. She {does/does not:200015} have additional problems to discuss today.    Most recent fall risk assessment:    04/26/2022   10:42 AM  Fall Risk   Falls in the past year? 0  Number falls in past yr: 0  Injury with Fall? 0  Risk for fall due to : No Fall Risks  Follow up Falls evaluation completed     Most recent depression screenings:    04/26/2022   10:42 AM 03/17/2021   10:46 AM  PHQ 2/9 Scores  PHQ - 2 Score 0 0  PHQ- 9 Score 5     {VISON DENTAL STD PSA (Optional):27386}  {History (Optional):23778}  Patient Care Team: Jessup, Joy, NP as PCP - General (Nurse Practitioner)   Outpatient Medications Prior to Visit  Medication Sig   fluticasone (FLONASE) 50 MCG/ACT nasal spray Place 2 sprays into both nostrils in the morning and at bedtime. After 7 days, reduce to once daily.   norgestimate-ethinyl estradiol (SPRINTEC 28) 0.25-35 MG-MCG tablet Take 1 tablet by mouth daily.   Nystatin POWD Apply liberally to affected area 2 times per day   spironolactone (ALDACTONE) 100 MG tablet Take 1 tablet (100 mg total) by mouth daily.   No facility-administered medications prior to visit.    ROS        Objective:     There were no vitals taken for this visit. {Vitals History (Optional):23777}  Physical Exam   No results found for any visits on 06/01/22. {Show previous labs (optional):23779}    Assessment & Plan:    Routine Health Maintenance and Physical Exam  Immunization History  Administered Date(s) Administered   DTaP 11/02/1999, 12/29/1999,  03/08/2000, 11/22/2000, 06/07/2004   Hepatitis A 04/03/2008, 04/09/2009   Hepatitis B 08/20/1999, 09/27/1999, 03/08/2000   HiB (PRP-OMP) 11/02/1999, 12/29/1999, 03/08/2000, 11/22/2000   IPV 11/02/1999, 12/29/1999, 08/27/2000, 06/07/2004   Influenza,inj,Quad PF,6+ Mos 07/10/2014   Influenza-Unspecified 10/09/2012   MMR 08/27/2001, 06/07/2004   Meningococcal Polysaccharide 04/08/2012   Pneumococcal Conjugate-13 11/22/2000   Pneumococcal-Unspecified 03/08/2000, 05/22/2000   Tdap 04/08/2012   Varicella 08/27/2000, 04/03/2008    Health Maintenance  Topic Date Due   HIV Screening  Never done   Hepatitis C Screening  Never done   INFLUENZA VACCINE  05/30/2022   PAP-Cervical Cytology Screening  06/01/2022 (Originally 08/18/2020)   PAP SMEAR-Modifier  06/01/2022 (Originally 08/18/2020)   TETANUS/TDAP  06/01/2022 (Originally 04/08/2022)   HPV VACCINES  Discontinued   COVID-19 Vaccine  Discontinued    Discussed health benefits of physical activity, and encouraged her to engage in regular exercise appropriate for her age and condition.  Problem List Items Addressed This Visit   None Visit Diagnoses     Annual physical exam    -  Primary   Cervical cancer screening       Need for Tdap vaccination          No follow-ups on file.     Joy Jessup, NP   

## 2022-02-16 ENCOUNTER — Ambulatory Visit: Payer: Medicaid Other | Admitting: Internal Medicine

## 2022-04-12 ENCOUNTER — Ambulatory Visit: Payer: Medicaid Other | Admitting: Family Medicine

## 2022-04-13 ENCOUNTER — Encounter: Payer: Self-pay | Admitting: Family Medicine

## 2022-04-13 ENCOUNTER — Ambulatory Visit (INDEPENDENT_AMBULATORY_CARE_PROVIDER_SITE_OTHER): Payer: Medicaid Other | Admitting: Family Medicine

## 2022-04-13 ENCOUNTER — Ambulatory Visit: Payer: Medicaid Other | Admitting: Family Medicine

## 2022-04-13 DIAGNOSIS — R7303 Prediabetes: Secondary | ICD-10-CM | POA: Diagnosis not present

## 2022-04-13 DIAGNOSIS — L98491 Non-pressure chronic ulcer of skin of other sites limited to breakdown of skin: Secondary | ICD-10-CM

## 2022-04-13 DIAGNOSIS — I10 Essential (primary) hypertension: Secondary | ICD-10-CM | POA: Diagnosis not present

## 2022-04-13 DIAGNOSIS — M2011 Hallux valgus (acquired), right foot: Secondary | ICD-10-CM | POA: Insufficient documentation

## 2022-04-13 LAB — POCT GLYCOSYLATED HEMOGLOBIN (HGB A1C): Hemoglobin A1C: 5.3 % (ref 4.0–5.6)

## 2022-04-13 NOTE — Progress Notes (Signed)
Established Patient Office Visit  Subjective:  Patient ID: Charles [name], male    DOB: 07-Sep-1956  Age: 66 y.o. MRN: [id number]  CC:  Chief Complaint  Patient presents with   Hypertension    Pt states his blood pressure has been running high. Would like to have his a1c levels checked also, since his Kidney doctor told him he needs to be put on dialysis.    HPI Charles [name] is a 66 y.o. male with past medical history of essential hypertension, and idiopathic peripheral neuropathy presents with c/o of elevated BP and pain in his left foot.  HTN: BP is elevated today. Pt reports not taking his BP medications yesterday and today. Reports eating one meal daily and not taking his BP medications in the morning. He mentions taking his meds sometimes once daily rather than twice daily. He reports that sometimes he remembers to take his BP medicine and sometimes he forgets. He voiced his frustration that his BP remains elevated, irrespective of him taking his meds. He reports high BP when he goes to see a nephrologist. The meds currently taken are carvedilol, nifedipine, and furosemide.   Right foot pain and sore: hx of idiopathic peripheral neuropathy with bunion pain and callus formation at the metatarsophalangeal joints of the big toes. He notes pain with walking and in the back of his heel at the Achilles tendon. He reports taking his neurontin with some relief of symptoms.  Past Medical History:  Diagnosis Date   Anemia    Anxiety    Chronic back pain    Dementia    Nursing facility feels he has Dementia   Diabetes mellitus    Facial numbness    Fracture of right foot    Hypertension    Mental disorder    schizophrenia    Past Surgical History:  Procedure Laterality Date   AV FISTULA PLACEMENT Left 12/20/2021   Procedure: LEFT ARM ARTERIOVENOUS (AV) FISTULA CREATION;  Surgeon: Rosetta Posner, MD;  Location: [facility];  Service: Vascular;  Laterality: Left;   BIOPSY  08/02/2021    Procedure: BIOPSY;  Surgeon: Harvel Quale, MD;  Location: [facility];  Service: Gastroenterology;;  duodenal and gastric biopsies   COLONOSCOPY N/A 04/11/2018   Procedure: COLONOSCOPY;  Surgeon: Rogene Houston, MD;  [facility]: [facility];  Service: Endoscopy;  Laterality: N/A;  10:30   COLONOSCOPY WITH PROPOFOL N/A 07/05/2018   Procedure: COLONOSCOPY WITH PROPOFOL;  Surgeon: Rogene Houston, MD;  [facility]: [facility];  Service: Endoscopy;  Laterality: N/A;  10:35   COLONOSCOPY WITH PROPOFOL N/A 08/02/2021   Procedure: COLONOSCOPY WITH PROPOFOL;  Surgeon: Harvel Quale, MD;  [facility];  Service: Gastroenterology;  Laterality: N/A;  9:05   ESOPHAGOGASTRODUODENOSCOPY (EGD) WITH PROPOFOL N/A 08/02/2021   Procedure: ESOPHAGOGASTRODUODENOSCOPY (EGD) WITH PROPOFOL;  Surgeon: Harvel Quale, MD;  [facility];  Service: Gastroenterology;  Laterality: N/A;   left 1st toe     ORIF TOE FRACTURE  09/05/2011   Procedure: OPEN REDUCTION INTERNAL FIXATION (ORIF) METATARSAL (TOE) FRACTURE;  Surgeon: Colin Rhein;  Location: [facility];  Service: Orthopedics;  Laterality: Left;  reconstruction left great toe FHB plantar plate   POLYPECTOMY  04/11/2018   Procedure: POLYPECTOMY;  Surgeon: Rogene Houston, MD;  [facility];  Service: Endoscopy;;  colon   POLYPECTOMY  07/05/2018   Procedure: POLYPECTOMY;  Surgeon: Rogene Houston, MD;  [facility];  Service: Endoscopy;;  colon   POLYPECTOMY  08/02/2021   Procedure: POLYPECTOMY;  Surgeon: Montez Morita, Quillian Quince, MD;  Location[facility];  Service: Gastroenterology;;  transverse colon polyp x 1 ascending colon polyp   right foot suger      Family History  Problem Relation Age of Onset   Diabetes Brother    Colon cancer Neg Hx    Liver disease Neg Hx        unknown for sure, mom died at age 62    Social History   Socioeconomic History   Marital  status: Single    Spouse name: Not on file   Number of children: Not on file   Years of education: Not on file   Highest education level: Not on file  Occupational History   Not on file  Tobacco Use   Smoking status: Every Day    Packs/day: 0.25    Years: 40.00    Total pack years: 10.00    Types: Cigarettes   Smokeless tobacco: Never  Vaping Use   Vaping Use: Never used  Substance and Sexual Activity   Alcohol use: Yes    Comment: 16-24 oz occasionally   Drug use: No   Sexual activity: Not on file  Other Topics Concern   Not on file  Social History Narrative   Not on file   Social Determinants of Health   Financial Resource Strain: Not on file  Food Insecurity: Not on file  Transportation Needs: Not on file  Physical Activity: Not on file  Stress: Not on file  Social Connections: Not on file  Intimate Partner Violence: Not on file    Outpatient Medications Prior to Visit  Medication Sig Dispense Refill   acetaminophen (TYLENOL 8 HOUR) 650 MG CR tablet Take 1 tablet (650 mg total) by mouth every 8 (eight) hours as needed for pain or fever. 30 tablet 0   aspirin EC 81 MG tablet Take 1 tablet (81 mg total) by mouth daily with breakfast.     calcitRIOL (ROCALTROL) 0.25 MCG capsule Take 0.25 mcg by mouth 3 (three) times a week.     carvedilol (COREG) 12.5 MG tablet Take 12.5 mg by mouth 2 (two) times daily.     carvedilol (COREG) 25 MG tablet Take 25 mg by mouth 2 (two) times daily.     Cholecalciferol 25 MCG (1000 UT) capsule Take 5,000 Units by mouth daily.     diclofenac Sodium (VOLTAREN) 1 % GEL Apply 2 g topically 4 (four) times daily.     ferrous sulfate 325 (65 FE) MG tablet Take 325 mg by mouth daily with breakfast.     gabapentin (NEURONTIN) 300 MG capsule Take 1 capsule (300 mg total) by mouth 3 (three) times daily. 90 capsule 2   NIFEdipine (ADALAT CC) 60 MG 24 hr tablet Take 60 mg by mouth daily.     sodium bicarbonate 650 MG tablet Take 650 mg by mouth 3  (three) times daily.     tamsulosin (FLOMAX) 0.4 MG CAPS capsule Take 0.4 mg by mouth.     amLODipine (NORVASC) 10 MG tablet Take 10 mg by mouth daily. (Patient not taking: Reported on 04/13/2022)     Bismuth 262 MG CHEW Chew 2 tablets by mouth every 6 (six) hours. 112 tablet 0   furosemide (LASIX) 40 MG tablet Take 20 mg by mouth 2 (two) times daily. (Patient not taking: Reported on 04/13/2022)     losartan (COZAAR)  25 MG tablet Take 25 mg by mouth daily. (Patient not taking: Reported on 04/13/2022)     omeprazole (PRILOSEC) 40 MG capsule Take 1 capsule (40 mg total) by mouth 2 (two) times daily for 14 days. 28 capsule 0   OVER THE COUNTER MEDICATION D 3 5,000 IU once per day. In addition to five 1,000 IU daily.     OVER THE COUNTER MEDICATION Vit D 3 1,000 IU five capsule per day in addition to a 5,000 IU capsule.     oxyCODONE-acetaminophen (PERCOCET) 5-325 MG tablet Take 1 tablet by mouth every 6 (six) hours as needed for severe pain. 8 tablet 0   pravastatin (PRAVACHOL) 40 MG tablet Take 40 mg by mouth daily.      No facility-administered medications prior to visit.    No Known Allergies  ROS Review of Systems  Eyes:  Negative for visual disturbance.  Musculoskeletal:  Positive for arthralgias.       Right foot pain  Neurological:  Negative for tremors, weakness, light-headedness, numbness and headaches.      Objective:    Physical Exam HENT:     Head: Normocephalic.  Cardiovascular:     Rate and Rhythm: Normal rate and regular rhythm.     Pulses: Normal pulses.     Heart sounds: Normal heart sounds.  Musculoskeletal:     Comments: bunion pain and callus formation at the metatarsophalangeal joints of the big toes  Neurological:     Mental Status: He is alert.     BP (!) 148/98   Pulse 75   Ht 5' 10.5" (1.791 m)   Wt 211 lb 9.6 oz (96 kg)   SpO2 98%   BMI 29.93 kg/m  Wt Readings from Last 3 Encounters:  04/13/22 211 lb 9.6 oz (96 kg)  01/18/22 218 lb 12.8 oz (99.2  kg)  12/20/21 216 lb 6.4 oz (98.2 kg)     Lab Results  Component Value Date   WBC 5.4 07/28/2021   HGB 12.6 (L) 12/20/2021   HCT 37.0 (L) 12/20/2021   MCV 93.8 07/28/2021   PLT 216 07/28/2021   Lab Results  Component Value Date   NA 141 12/20/2021   K 4.6 12/20/2021   CO2 22 07/28/2021   GLUCOSE 74 12/20/2021   BUN 43 (H) 12/20/2021   CREATININE 4.10 (H) 12/20/2021   BILITOT 0.3 07/19/2016   ALKPHOS 80 07/19/2016   AST 21 07/19/2016   ALT 21 07/19/2016   PROT 7.6 07/19/2016   ALBUMIN 4.0 04/06/2021   CALCIUM 9.3 07/28/2021   ANIONGAP 8 07/28/2021   Lab Results  Component Value Date   CHOL  06/27/2007    188        ATP III CLASSIFICATION:  <200     mg/dL   Desirable  200-239  mg/dL   Borderline High  >=240    mg/dL   High   Lab Results  Component Value Date   HDL 29 (L) 06/27/2007   Lab Results  Component Value Date   LDLCALC  06/27/2007    96        Total Cholesterol/HDL:CHD Risk Coronary Heart Disease Risk Table                     Men   Women  1/2 Average Risk   3.4   3.3   Lab Results  Component Value Date   TRIG 317 (H) 06/27/2007   Lab Results  Component Value Date  CHOLHDL 6.5 06/27/2007   Lab Results  Component Value Date   HGBA1C 5.3 04/13/2022      Assessment & Plan:   Problem List Items Addressed This Visit       Cardiovascular and Mediastinum   Essential hypertension - Primary    -Elevated BP due to noncompliance with medication regimen -No changes to the medication regimen -Encouraged pt to take medication as prescribed -Will follow up in 1 month concerning medication compliance      Relevant Medications   carvedilol (COREG) 12.5 MG tablet   NIFEdipine (ADALAT CC) 60 MG 24 hr tablet   carvedilol (COREG) 25 MG tablet     Musculoskeletal and Integument   Hallux valgus, right    -C/o of bunion pain and callus formation at the metatarsophalangeal joints of the big toes.  -He notes pain with walking and in the back of his  heel at the Achilles tendon -Complaint with Neurontin -Will refer pt to a podiatrist for further evaluation      Relevant Orders   Ambulatory referral to Podiatry     Other   Prediabetes   Relevant Orders   POCT glycosylated hemoglobin (Hb A1C) (Completed)   Other Visit Diagnoses     Neuropathic ulcer, limited to breakdown of skin ([place])       Relevant Orders   Ambulatory referral to Podiatry       No orders of the defined types were placed in this encounter.   Follow-up: Return in about 1 month (around 05/13/2022) for BP.    Alvira Monday, FNP

## 2022-04-13 NOTE — Assessment & Plan Note (Signed)
-  Elevated BP due to noncompliance with medication regimen -No changes to the medication regimen -Encouraged pt to take medication as prescribed -Will follow up in 1 month concerning medication compliance

## 2022-04-13 NOTE — Patient Instructions (Signed)
I appreciate the opportunity to provide care to you today!   Referral: podiatry     Please continue to a heart-healthy diet and increase your physical activities. Try to exercise for 34mns at least three times a week.      It was a pleasure to see you and I look forward to continuing to work together on your health and well-being. Please do not hesitate to call the office if you need care or have questions about your care.   Have a wonderful day and week. With Gratitude, GAlvira MondayMSN, FNP-BC

## 2022-04-13 NOTE — Assessment & Plan Note (Signed)
-  C/o of bunion pain and callus formation at the metatarsophalangeal joints of the big toes.  -He notes pain with walking and in the back of his heel at the Achilles tendon -Complaint with Neurontin -Will refer pt to a podiatrist for further evaluation

## 2022-04-15 ENCOUNTER — Encounter (HOSPITAL_COMMUNITY): Payer: Self-pay | Admitting: Emergency Medicine

## 2022-04-15 ENCOUNTER — Emergency Department (HOSPITAL_COMMUNITY): Payer: Medicaid Other

## 2022-04-15 ENCOUNTER — Other Ambulatory Visit: Payer: Self-pay

## 2022-04-15 ENCOUNTER — Emergency Department (HOSPITAL_COMMUNITY)
Admission: EM | Admit: 2022-04-15 | Discharge: 2022-04-15 | Disposition: A | Discharge status: Home / Self Care | Payer: Medicaid Other | Attending: Emergency Medicine | Admitting: Emergency Medicine

## 2022-04-15 DIAGNOSIS — Z79899 Other long term (current) drug therapy: Secondary | ICD-10-CM | POA: Diagnosis not present

## 2022-04-15 DIAGNOSIS — M79642 Pain in left hand: Secondary | ICD-10-CM | POA: Diagnosis not present

## 2022-04-15 DIAGNOSIS — Z7982 Long term (current) use of aspirin: Secondary | ICD-10-CM | POA: Insufficient documentation

## 2022-04-15 DIAGNOSIS — T829XXA Unspecified complication of cardiac and vascular prosthetic device, implant and graft, initial encounter: Secondary | ICD-10-CM | POA: Insufficient documentation

## 2022-04-15 DIAGNOSIS — M25532 Pain in left wrist: Secondary | ICD-10-CM | POA: Diagnosis present

## 2022-04-15 DIAGNOSIS — N185 Chronic kidney disease, stage 5: Secondary | ICD-10-CM | POA: Diagnosis not present

## 2022-04-15 DIAGNOSIS — I120 Hypertensive chronic kidney disease with stage 5 chronic kidney disease or end stage renal disease: Secondary | ICD-10-CM | POA: Insufficient documentation

## 2022-04-15 NOTE — ED Provider Notes (Signed)
[facility] Provider Note   CSN: [id number] Arrival date & time: 04/15/22  3491     History  Chief Complaint  Patient presents with   Hand Pain    Charles [name] is a 66 y.o. male with a history significant for hypertension, hypertensive kidney disease and possibly anticipating dialysis in the near future, stating he had a fistula placed by Dr. Donnetta Hutching several months ago in the left wrist, presenting with pain at that site after being placed in handcuffs 4 days ago during a traffic stop.  He states the cuffs were placed directly over his fistula site very tightly and since this event he has had persistent pain at the site and has noted the vessel proximal to the fistula is more distended than normal.  He denies numbness, tingling or pain radiating into the hand or fingers.  He has had no treatment prior to arrival for symptoms.  The history is provided by the patient.       Home Medications Prior to Admission medications   Medication Sig Start Date End Date Taking? Authorizing Provider  acetaminophen (TYLENOL 8 HOUR) 650 MG CR tablet Take 1 tablet (650 mg total) by mouth every 8 (eight) hours as needed for pain or fever. 07/28/20   Varney Biles, MD  amLODipine (NORVASC) 10 MG tablet Take 10 mg by mouth daily. Patient not taking: Reported on 04/13/2022    [provider]  aspirin EC 81 MG tablet Take 1 tablet (81 mg total) by mouth daily with breakfast. 07/15/18   Rehman, Mechele Dawley, MD  Bismuth 262 MG CHEW Chew 2 tablets by mouth every 6 (six) hours. 08/04/21   Harvel Quale, MD  calcitRIOL (ROCALTROL) 0.25 MCG capsule Take 0.25 mcg by mouth 3 (three) times a week.    [provider]  carvedilol (COREG) 12.5 MG tablet Take 12.5 mg by mouth 2 (two) times daily. 11/15/21   [provider]  carvedilol (COREG) 25 MG tablet Take 25 mg by mouth 2 (two) times daily. 11/29/21   [provider]  Cholecalciferol 25 MCG (1000 UT)  capsule Take 5,000 Units by mouth daily.    [provider]  diclofenac Sodium (VOLTAREN) 1 % GEL Apply 2 g topically 4 (four) times daily.    [provider]  ferrous sulfate 325 (65 FE) MG tablet Take 325 mg by mouth daily with breakfast.    [provider]  furosemide (LASIX) 40 MG tablet Take 20 mg by mouth 2 (two) times daily. Patient not taking: Reported on 04/13/2022    [provider]  gabapentin (NEURONTIN) 300 MG capsule Take 1 capsule (300 mg total) by mouth 3 (three) times daily. 10/18/21   Lindell Spar, MD  losartan (COZAAR) 25 MG tablet Take 25 mg by mouth daily. Patient not taking: Reported on 04/13/2022    [provider]  NIFEdipine (ADALAT CC) 60 MG 24 hr tablet Take 60 mg by mouth daily. 03/16/22   [provider]  omeprazole (PRILOSEC) 40 MG capsule Take 1 capsule (40 mg total) by mouth 2 (two) times daily for 14 days. 08/04/21 08/18/21  Harvel Quale, MD  OVER THE COUNTER MEDICATION D 3 5,000 IU once per day. In addition to five 1,000 IU daily.    [provider]  OVER THE COUNTER MEDICATION Vit D 3 1,000 IU five capsule per day in addition to a 5,000 IU capsule.    [provider]  oxyCODONE-acetaminophen (PERCOCET) [phone]  MG tablet Take 1 tablet by mouth every 6 (six) hours as needed for severe pain. 12/20/21   Rosetta Posner, MD  pravastatin (PRAVACHOL) 40 MG tablet Take 40 mg by mouth daily.     [provider]  sodium bicarbonate 650 MG tablet Take 650 mg by mouth 3 (three) times daily.    [provider]  tamsulosin (FLOMAX) 0.4 MG CAPS capsule Take 0.4 mg by mouth.    [provider]      Allergies    Patient has no known allergies.    Review of Systems   Review of Systems  Constitutional:  Negative for fever.  Respiratory: Negative.    Cardiovascular: Negative.   Musculoskeletal:  Positive for arthralgias. Negative for joint swelling and myalgias.  Skin:   Negative for color change.  Neurological:  Negative for weakness and numbness.  All other systems reviewed and are negative.   Physical Exam Updated Vital Signs BP (!) 148/86 (BP Location: Left Arm)   Pulse 81   Temp 97.7 F (36.5 C) (Oral)   Resp 18   Ht 5' 10.5" (1.791 m)   Wt 96 kg   SpO2 99%   BMI 29.93 kg/m  Physical Exam Vitals and nursing note reviewed.  Constitutional:      Appearance: He is well-developed.  HENT:     Head: Normocephalic and atraumatic.  Eyes:     Conjunctiva/sclera: Conjunctivae normal.  Cardiovascular:     Rate and Rhythm: Normal rate and regular rhythm.     Heart sounds: Normal heart sounds.     Comments: Fistula left distal forearm with an adequate thrill.  It is tender, there is mild distention of the vessel more proximal to the site.  No obvious bruising or other trauma to the site. Pulmonary:     Effort: Pulmonary effort is normal.     Breath sounds: Normal breath sounds.  Musculoskeletal:        General: Tenderness present. Normal range of motion.     Cervical back: Normal range of motion.     Comments: He has general pain in the left hand, worse with attempts at making a fist but there is no focal area of pain, swelling, no deformity.  Skin:    General: Skin is warm and dry.  Neurological:     Mental Status: He is alert.     ED Results / Procedures / Treatments   Labs (all labs ordered are listed, but only abnormal results are displayed) Labs Reviewed - No data to display  EKG None  Radiology [place] Upper Ext Art Left Ltd  Result Date: 04/15/2022 CLINICAL DATA:  66 year old male with compression injury of left radiocephalic fistula EXAM: LEFT UPPER EXTREMITY ARTERIAL DUPLEX SCAN TECHNIQUE: Gray-scale sonography as well as color Doppler and duplex ultrasound was performed to evaluate the arteries of the upper extremity. COMPARISON:  None Available. FINDINGS: Directed duplex of the left arm radiocephalic fistula. Cephalic venous  outflow patent. Patent radial artery proximal to the anastomosis with low resistance diastolic component. Measured velocity 140 centimeter/second within the radial artery. Velocity at the anastomosis measured 478 centimeter/second. Velocity in the proximal outflow measures 47 centimeter/second. Venous outflow is monophasic with low diastolic component. IMPRESSION: Patent left-sided radiocephalic fistula. The measured velocities suggest hemodynamic significant stenosis at the AV anastomosis. Signed, Dulcy Fanny. Nadene Rubins, RPVI [facility] Radiology Electronically Signed   By: Corrie Mckusick D.O.   On: 04/15/2022 11:40   DG  Hand Complete Left  Result Date: 04/15/2022 CLINICAL DATA:  Acute LEFT hand pain.  Initial encounter. EXAM: LEFT HAND - COMPLETE 3+ VIEW COMPARISON:  06/23/2021 wrist radiographs FINDINGS: No acute fracture or dislocation identified. No focal bony lesions are noted. Degenerative changes in the wrist and vascular calcifications are again noted. IMPRESSION: No evidence of acute abnormality. Electronically Signed   By: Margarette Canada M.D.   On: 04/15/2022 10:02    Procedures Procedures    Medications Ordered in ED Medications - No data to display  ED Course/ Medical Decision Making/ A&P                           Medical Decision Making Imaging reviewed and discussed with patient.  The fistula appears patent without evidence of trauma from Tuesday's event.  Because he is having discomfort and perceived distention of the proximal vessel, he would benefit from outpatient follow-up this week with vascular surgery.  I talked with Dr. Stanford Breed regarding this patient who will plan for patient to be seen in their office early this week.  This information was given to the patient, he will call late Monday afternoon if he has not heard from their office by then.  Amount and/or Complexity of Data Reviewed Radiology: ordered.    Details:  Reviewed.  No acute trauma, aneurysm.  Fistula is patent. Discussion of management or test interpretation with external provider(s): Discussed with Dr. Stanford Breed per above           Final Clinical Impression(s) / ED Diagnoses Final diagnoses:  Complication of arteriovenous dialysis fistula, initial encounter    Rx / DC Orders ED Discharge Orders     None         Landis Martins 04/15/22 1253    Davonna Belling, MD 04/15/22 1558

## 2022-04-15 NOTE — ED Triage Notes (Addendum)
Pt to the ED with complaints of Left hand pain after being handcuffed on Tuesday. Pt states his hand is cramping and it is constant.

## 2022-04-15 NOTE — Discharge Instructions (Signed)
Dr. Luther Parody office should call you on Monday for an appointment time for recheck of your fistula this week.  If you have not heard from them by late Monday afternoon you can give them a call as well.  Your ultrasound is reassuring but given your pain and distention of that vessel I would like your vascular specialist to look at your arm this week.

## 2022-04-18 ENCOUNTER — Other Ambulatory Visit: Payer: Self-pay

## 2022-04-18 ENCOUNTER — Encounter (HOSPITAL_COMMUNITY): Payer: Self-pay | Admitting: *Deleted

## 2022-04-18 ENCOUNTER — Emergency Department (HOSPITAL_COMMUNITY)
Admission: EM | Admit: 2022-04-18 | Discharge: 2022-04-18 | Disposition: A | Discharge status: Home / Self Care | Payer: Medicaid Other | Attending: Emergency Medicine | Admitting: Emergency Medicine

## 2022-04-18 DIAGNOSIS — Z79899 Other long term (current) drug therapy: Secondary | ICD-10-CM | POA: Diagnosis not present

## 2022-04-18 DIAGNOSIS — R519 Headache, unspecified: Secondary | ICD-10-CM | POA: Insufficient documentation

## 2022-04-18 DIAGNOSIS — R112 Nausea with vomiting, unspecified: Secondary | ICD-10-CM | POA: Diagnosis not present

## 2022-04-18 DIAGNOSIS — N186 End stage renal disease: Secondary | ICD-10-CM | POA: Insufficient documentation

## 2022-04-18 DIAGNOSIS — R55 Syncope and collapse: Secondary | ICD-10-CM | POA: Diagnosis present

## 2022-04-18 DIAGNOSIS — Z7982 Long term (current) use of aspirin: Secondary | ICD-10-CM | POA: Insufficient documentation

## 2022-04-18 LAB — COMPREHENSIVE METABOLIC PANEL
ALT: 11 U/L (ref 0–44)
AST: 16 U/L (ref 15–41)
Albumin: 3.9 g/dL (ref 3.5–5.0)
Alkaline Phosphatase: 77 U/L (ref 38–126)
Anion gap: 6 (ref 5–15)
BUN: 33 mg/dL — ABNORMAL HIGH (ref 8–23)
CO2: 21 mmol/L — ABNORMAL LOW (ref 22–32)
Calcium: 8.9 mg/dL (ref 8.9–10.3)
Chloride: 112 mmol/L — ABNORMAL HIGH (ref 98–111)
Creatinine, Ser: 5.14 mg/dL — ABNORMAL HIGH (ref 0.61–1.24)
GFR, Estimated: 12 mL/min — ABNORMAL LOW (ref >60)
Glucose, Bld: 179 mg/dL — ABNORMAL HIGH (ref 70–99)
Potassium: 4 mmol/L (ref 3.5–5.1)
Sodium: 139 mmol/L (ref 135–145)
Total Bilirubin: 0.4 mg/dL (ref 0.3–1.2)
Total Protein: 7.2 g/dL (ref 6.5–8.1)

## 2022-04-18 LAB — CBC WITH DIFFERENTIAL/PLATELET
Abs Immature Granulocytes: 0.02 10*3/uL (ref 0.00–0.07)
Basophils Absolute: 0 10*3/uL (ref 0.0–0.1)
Basophils Relative: 1 %
Eosinophils Absolute: 0.1 10*3/uL (ref 0.0–0.5)
Eosinophils Relative: 1 %
HCT: 33.2 % — ABNORMAL LOW (ref 39.0–52.0)
Hemoglobin: 10.6 g/dL — ABNORMAL LOW (ref 13.0–17.0)
Immature Granulocytes: 0 %
Lymphocytes Relative: 27 %
Lymphs Abs: 1.4 10*3/uL (ref 0.7–4.0)
MCH: 30.1 pg (ref 26.0–34.0)
MCHC: 31.9 g/dL (ref 30.0–36.0)
MCV: 94.3 fL (ref 80.0–100.0)
Monocytes Absolute: 0.3 10*3/uL (ref 0.1–1.0)
Monocytes Relative: 5 %
Neutro Abs: 3.3 10*3/uL (ref 1.7–7.7)
Neutrophils Relative %: 66 %
Platelets: 224 10*3/uL (ref 150–400)
RBC: 3.52 MIL/uL — ABNORMAL LOW (ref 4.22–5.81)
RDW: 14.3 % (ref 11.5–15.5)
WBC: 5.1 10*3/uL (ref 4.0–10.5)
nRBC: 0 % (ref 0.0–0.2)

## 2022-04-18 NOTE — ED Triage Notes (Signed)
Pt had loc of 3-4 minutes by witnesses; pt vomited after passing out;   Pt c/o back and feet pain 115/68 P-77 O2 100%  Cbg 192

## 2022-04-18 NOTE — Discharge Instructions (Addendum)
Keep your appointment with your nephrologist in 2 days for starting dialysis.  Be reassured that your work-up today was overall negative for anything concerning syncopal or passing out symptoms.  Please be careful about standing up quickly from a seated position as this could make your symptoms worse.  If you continue to have episodes of passing out, please do not hesitate to return to the emergency department.  Otherwise, do not hesitate to return to emergency department if the worrisome signs and symptoms we discussed become apparent.

## 2022-04-18 NOTE — ED Provider Notes (Cosign Needed Addendum)
Headache [facility] EMERGENCY DEPARTMENT Provider Note   CSN: [id number] Arrival date & time: 04/18/22  1333     History  Chief Complaint  Patient presents with   Loss of Consciousness    Charles [name] is a 66 y.o. male.   Loss of Consciousness Associated symptoms: nausea and vomiting   Associated symptoms: no chest pain, no fever, no palpitations, no seizures and no shortness of breath    66 year old male presents emergency department with complaints of syncopal episode.  Patient states that he was sitting desk during a sobriety class when he felt ill and laid his head down on the desk and proceeded to pass out.  It was reported that he had lost consciousness for 3 to 4 minutes and had 1 episode of vomiting before regaining consciousness.  Currently he is only endorsing a mild headache and "feeling tired."  He is an ESRD patient with fistula placement but has not started dialysis.  Recent CMP showed creatinine of 4.1, BUN of 43 performed on 12/20/2021.  Secondarily endorses stools being dark green/black recently.  Denies fever, chills, night sweats, cough, congestion, chest pain, shortness of breath, abdominal pain, nausea/vomiting/diarrhea, urinary symptoms.  Denies history of seizure or seizure activity earlier.  Home Medications Prior to Admission medications   Medication Sig Start Date End Date Taking? Authorizing Provider  acetaminophen (TYLENOL 8 HOUR) 650 MG CR tablet Take 1 tablet (650 mg total) by mouth every 8 (eight) hours as needed for pain or fever. 07/28/20   Varney Biles, MD  amLODipine (NORVASC) 10 MG tablet Take 10 mg by mouth daily. Patient not taking: Reported on 04/13/2022    [provider]  aspirin EC 81 MG tablet Take 1 tablet (81 mg total) by mouth daily with breakfast. 07/15/18   Rehman, Mechele Dawley, MD  Bismuth 262 MG CHEW Chew 2 tablets by mouth every 6 (six) hours. 08/04/21   Harvel Quale, MD  calcitRIOL (ROCALTROL) 0.25 MCG capsule Take  0.25 mcg by mouth 3 (three) times a week.    [provider]  carvedilol (COREG) 12.5 MG tablet Take 12.5 mg by mouth 2 (two) times daily. 11/15/21   [provider]  carvedilol (COREG) 25 MG tablet Take 25 mg by mouth 2 (two) times daily. 11/29/21   [provider]  Cholecalciferol 25 MCG (1000 UT) capsule Take 5,000 Units by mouth daily.    [provider]  diclofenac Sodium (VOLTAREN) 1 % GEL Apply 2 g topically 4 (four) times daily.    [provider]  ferrous sulfate 325 (65 FE) MG tablet Take 325 mg by mouth daily with breakfast.    [provider]  furosemide (LASIX) 40 MG tablet Take 20 mg by mouth 2 (two) times daily. Patient not taking: Reported on 04/13/2022    [provider]  gabapentin (NEURONTIN) 300 MG capsule Take 1 capsule (300 mg total) by mouth 3 (three) times daily. 10/18/21   Lindell Spar, MD  losartan (COZAAR) 25 MG tablet Take 25 mg by mouth daily. Patient not taking: Reported on 04/13/2022    [provider]  NIFEdipine (ADALAT CC) 60 MG 24 hr tablet Take 60 mg by mouth daily. 03/16/22   [provider]  omeprazole (PRILOSEC) 40 MG capsule Take 1 capsule (40 mg total) by mouth 2 (two) times daily for 14 days. 08/04/21 08/18/21  Harvel Quale, MD  OVER THE COUNTER MEDICATION D 3 5,000 IU once per day. In addition to  five 1,000 IU daily.    [provider]  OVER THE COUNTER MEDICATION Vit D 3 1,000 IU five capsule per day in addition to a 5,000 IU capsule.    [provider]  oxyCODONE-acetaminophen (PERCOCET) 5-325 MG tablet Take 1 tablet by mouth every 6 (six) hours as needed for severe pain. 12/20/21   Rosetta Posner, MD  pravastatin (PRAVACHOL) 40 MG tablet Take 40 mg by mouth daily.     [provider]  sodium bicarbonate 650 MG tablet Take 650 mg by mouth 3 (three) times daily.    [provider]  tamsulosin (FLOMAX) 0.4 MG CAPS capsule Take 0.4 mg  by mouth.    [provider]      Allergies    Patient has no known allergies.    Review of Systems   Review of Systems  Constitutional:  Negative for chills and fever.  HENT:  Negative for ear pain and sore throat.   Eyes:  Negative for pain and visual disturbance.  Respiratory:  Negative for cough and shortness of breath.   Cardiovascular:  Positive for syncope. Negative for chest pain and palpitations.  Gastrointestinal:  Positive for nausea and vomiting. Negative for abdominal pain.  Genitourinary:  Negative for dysuria and hematuria.       Dark-colored stool  Musculoskeletal:  Negative for arthralgias and back pain.  Skin:  Negative for color change and rash.  Neurological:  Positive for syncope. Negative for seizures.  All other systems reviewed and are negative.   Physical Exam Updated Vital Signs BP 128/74   Pulse 72   Temp 97.6 F (36.4 C) (Oral)   Resp 19   Ht 5' 10.5" (1.791 m)   Wt 96 kg   SpO2 96%   BMI 29.93 kg/m  Physical Exam Vitals and nursing note reviewed.  Constitutional:      General: He is not in acute distress.    Appearance: He is well-developed.  HENT:     Head: Normocephalic and atraumatic.  Eyes:     General: No visual field deficit.       Right eye: No discharge.        Left eye: No discharge.     Extraocular Movements: Extraocular movements intact.     Conjunctiva/sclera: Conjunctivae normal.     Pupils: Pupils are equal, round, and reactive to light.  Cardiovascular:     Rate and Rhythm: Normal rate and regular rhythm.     Heart sounds: No murmur heard. Pulmonary:     Effort: Pulmonary effort is normal. No respiratory distress.     Breath sounds: Normal breath sounds.  Abdominal:     Palpations: Abdomen is soft.     Tenderness: There is no abdominal tenderness.  Musculoskeletal:        General: No swelling.     Cervical back: Neck supple.  Skin:    General: Skin is warm and dry.     Capillary Refill: Capillary refill  takes less than 2 seconds.  Neurological:     General: No focal deficit present.     Mental Status: He is alert and oriented to person, place, and time.     Cranial Nerves: No dysarthria or facial asymmetry.     Motor: No weakness, seizure activity or pronator drift.     Coordination: Romberg sign negative. Coordination normal. Finger-Nose-Finger Test and Heel to [facility] Test normal.     Comments: Cranial 3 through 12 grossly intact.  No dysarthria  or facial asymmetry noted.  No sensory deprivation along major nerve distributions of upper or lower extremities.  Muscle strength 5 out of 5 upper and lower extremities.  Radial and posterior tibial pulses full and intact bilaterally.  No lower extremity edema noticed.  Psychiatric:        Mood and Affect: Mood normal.        Behavior: Behavior normal.     ED Results / Procedures / Treatments   Labs (all labs ordered are listed, but only abnormal results are displayed) Labs Reviewed  COMPREHENSIVE METABOLIC PANEL - Abnormal; Notable for the following components:      Result Value   Chloride 112 (*)    CO2 21 (*)    Glucose, Bld 179 (*)    BUN 33 (*)    Creatinine, Ser 5.14 (*)    GFR, Estimated 12 (*)    All other components within normal limits  CBC WITH DIFFERENTIAL/PLATELET - Abnormal; Notable for the following components:   RBC 3.52 (*)    Hemoglobin 10.6 (*)    HCT 33.2 (*)    All other components within normal limits  POC OCCULT BLOOD, ED    EKG EKG Interpretation  Date/Time:  Tuesday April 18 2022 14:33:43 EDT Ventricular Rate:  70 PR Interval:  221 QRS Duration: 104 QT Interval:  408 QTC Calculation: 441 R Axis:   -56 Text Interpretation: Sinus or ectopic atrial rhythm Borderline prolonged PR interval Left anterior fascicular block Anteroseptal infarct, old Confirmed by Noemi Chapel [phone]) on 04/18/2022 3:02:08 PM  Radiology No results found.  Procedures Procedures    Medications Ordered in ED Medications - No  data to display  ED Course/ Medical Decision Making/ A&P                           Medical Decision Making Amount and/or Complexity of Data Reviewed Labs: ordered. ECG/medicine tests: ordered.   This patient presents to the ED for concern of syncopal episode, this involves an extensive number of treatment options, and is a complaint that carries with it a high risk of complications and morbidity.  The differential diagnosis includes electrolyte abnormality, sepsis, arrhythmia, valvular disease, CHF, CVA, carotid artery stenosis.   Co morbidities that complicate the patient evaluation  Anemia chronic, dementia, diabetes mellitus, hypertension, BPH, hyperlipidemia   Additional history obtained:  Additional history obtained from EMR External records from outside source obtained and reviewed including CMP from 12/20/2021 as listed above to gauge baseline renal function.   Lab Tests:  I Ordered, and personally interpreted labs.  The pertinent results include: CMP with chloride of 112, bicarb 21, BUN of 33, creatinine 5.14, GFR 12, hemoglobin 10.6.    Imaging Studies ordered:  N/a   Cardiac Monitoring: / EKG:  The patient was maintained on a cardiac monitor.  I personally viewed and interpreted the cardiac monitored which showed an underlying rhythm of: Sinus rhythm with PR elongation of 221 ms with no QRS dropping   Consultations Obtained:  N/a   Problem List / ED Course / Critical interventions / Medication management  Syncope Reevaluation of the patient showed that the patient improved I have reviewed the patients home medicines and have made adjustments as needed   Social Determinants of Health:  Chronic cigarette use as well as 3-4 alcoholic beverages per night.  Denies illicit drug use   Test / Admission - Considered:  Syncope Vitals signs  within normal range and  stable throughout visit. Laboratory studies significant for: No acute abnormalities from  baseline.  Patient has close follow-up with nephrology in 2 days.  Given negative acute findings as well as negative EKG for any concerning changes, patient seems safe for discharge.  He was educated regarding appropriate fluid intake given his CKD diagnosis No concern for CVA or brain bleed given lack of head trauma, negative neurologic findings, blood thinner use.  Consulted supervising physician Dr. Sabra Heck regarding the patient and he agreed with negative CT at this point.  CMP insignificant for any acute metabolic derangement.  CBC showed no signs of overt infection or anemia.  Hemoccult negative with no signs of active bleed on DRE.  Patient educated regarding quick return to emergency department if symptoms return for further work-up. Worrisome signs and symptoms were discussed with the patient, and the patient acknowledged understanding to return to the ED if noticed. Patient was stable upon discharge.          Final Clinical Impression(s) / ED Diagnoses Final diagnoses:  Syncope, unspecified syncope type    Rx / DC Orders ED Discharge Orders     None         Wilnette Kales, [place] 04/18/22 1543    Wilnette Kales, [place] 04/18/22 1607    Noemi Chapel, MD 04/19/22 1100

## 2022-04-18 NOTE — ED Provider Notes (Incomplete)
Headache [facility] EMERGENCY DEPARTMENT Provider Note   CSN: [id number] Arrival date & time: 04/18/22  1333     History {Add pertinent medical, surgical, social history, OB history to HPI:1} Chief Complaint  Patient presents with  . Loss of Consciousness    Charles [name] is a 66 y.o. male.   Loss of Consciousness Associated symptoms: nausea and vomiting   Associated symptoms: no chest pain, no fever, no palpitations, no seizures and no shortness of breath    66 year old male presents emergency department with complaints of syncopal episode.  Patient states that he was sitting desk during a sobriety class when he felt ill and laid his head down on the desk and proceeded to pass out.  It was reported that he had lost consciousness for 3 to 4 minutes and had 1 episode of vomiting before regaining consciousness.  Currently he is only endorsing a mild headache and "feeling tired."  He is an ESRD patient with fistula placement but has not started dialysis.  Recent CMP showed creatinine of 4.1, BUN of 43 performed on 12/20/2021.  Secondarily endorses stools being dark green/black recently.  Denies fever, chills, night sweats, cough, congestion, chest pain, shortness of breath, abdominal pain, nausea/vomiting/diarrhea, urinary symptoms.  Denies history of seizure or seizure activity earlier.  Home Medications Prior to Admission medications   Medication Sig Start Date End Date Taking? Authorizing Provider  acetaminophen (TYLENOL 8 HOUR) 650 MG CR tablet Take 1 tablet (650 mg total) by mouth every 8 (eight) hours as needed for pain or fever. 07/28/20   Varney Biles, MD  amLODipine (NORVASC) 10 MG tablet Take 10 mg by mouth daily. Patient not taking: Reported on 04/13/2022    [provider]  aspirin EC 81 MG tablet Take 1 tablet (81 mg total) by mouth daily with breakfast. 07/15/18   Rehman, Mechele Dawley, MD  Bismuth 262 MG CHEW Chew 2 tablets by mouth every 6 (six) hours. 08/04/21    Harvel Quale, MD  calcitRIOL (ROCALTROL) 0.25 MCG capsule Take 0.25 mcg by mouth 3 (three) times a week.    [provider]  carvedilol (COREG) 12.5 MG tablet Take 12.5 mg by mouth 2 (two) times daily. 11/15/21   [provider]  carvedilol (COREG) 25 MG tablet Take 25 mg by mouth 2 (two) times daily. 11/29/21   [provider]  Cholecalciferol 25 MCG (1000 UT) capsule Take 5,000 Units by mouth daily.    [provider]  diclofenac Sodium (VOLTAREN) 1 % GEL Apply 2 g topically 4 (four) times daily.    [provider]  ferrous sulfate 325 (65 FE) MG tablet Take 325 mg by mouth daily with breakfast.    [provider]  furosemide (LASIX) 40 MG tablet Take 20 mg by mouth 2 (two) times daily. Patient not taking: Reported on 04/13/2022    [provider]  gabapentin (NEURONTIN) 300 MG capsule Take 1 capsule (300 mg total) by mouth 3 (three) times daily. 10/18/21   Lindell Spar, MD  losartan (COZAAR) 25 MG tablet Take 25 mg by mouth daily. Patient not taking: Reported on 04/13/2022    [provider]  NIFEdipine (ADALAT CC) 60 MG 24 hr tablet Take 60 mg by mouth daily. 03/16/22   [provider]  omeprazole (PRILOSEC) 40 MG capsule Take 1 capsule (40 mg total) by mouth 2 (two) times daily for 14 days. 08/04/21 08/18/21  Harvel Quale, MD  OVER THE COUNTER MEDICATION D  3 5,000 IU once per day. In addition to five 1,000 IU daily.    [provider]  OVER THE COUNTER MEDICATION Vit D 3 1,000 IU five capsule per day in addition to a 5,000 IU capsule.    [provider]  oxyCODONE-acetaminophen (PERCOCET) 5-325 MG tablet Take 1 tablet by mouth every 6 (six) hours as needed for severe pain. 12/20/21   Rosetta Posner, MD  pravastatin (PRAVACHOL) 40 MG tablet Take 40 mg by mouth daily.     [provider]  sodium bicarbonate 650 MG tablet Take 650 mg by mouth 3 (three) times daily.     [provider]  tamsulosin (FLOMAX) 0.4 MG CAPS capsule Take 0.4 mg by mouth.    [provider]      Allergies    Patient has no known allergies.    Review of Systems   Review of Systems  Constitutional:  Negative for chills and fever.  HENT:  Negative for ear pain and sore throat.   Eyes:  Negative for pain and visual disturbance.  Respiratory:  Negative for cough and shortness of breath.   Cardiovascular:  Positive for syncope. Negative for chest pain and palpitations.  Gastrointestinal:  Positive for nausea and vomiting. Negative for abdominal pain.  Genitourinary:  Negative for dysuria and hematuria.       Dark-colored stool  Musculoskeletal:  Negative for arthralgias and back pain.  Skin:  Negative for color change and rash.  Neurological:  Positive for syncope. Negative for seizures.  All other systems reviewed and are negative.   Physical Exam Updated Vital Signs BP 131/73   Pulse 72   Temp 97.6 F (36.4 C) (Oral)   Resp 17   Ht 5' 10.5" (1.791 m)   Wt 96 kg   SpO2 100%   BMI 29.93 kg/m  Physical Exam Vitals and nursing note reviewed.  Constitutional:      General: He is not in acute distress.    Appearance: He is well-developed.  HENT:     Head: Normocephalic and atraumatic.  Eyes:     General: No visual field deficit.       Right eye: No discharge.        Left eye: No discharge.     Extraocular Movements: Extraocular movements intact.     Conjunctiva/sclera: Conjunctivae normal.     Pupils: Pupils are equal, round, and reactive to light.  Cardiovascular:     Rate and Rhythm: Normal rate and regular rhythm.     Heart sounds: No murmur heard. Pulmonary:     Effort: Pulmonary effort is normal. No respiratory distress.     Breath sounds: Normal breath sounds.  Abdominal:     Palpations: Abdomen is soft.     Tenderness: There is no abdominal tenderness.  Musculoskeletal:        General: No swelling.     Cervical back: Neck supple.   Skin:    General: Skin is warm and dry.     Capillary Refill: Capillary refill takes less than 2 seconds.  Neurological:     General: No focal deficit present.     Mental Status: He is alert and oriented to person, place, and time.     Cranial Nerves: No dysarthria or facial asymmetry.     Motor: No weakness, seizure activity or pronator drift.     Coordination: Romberg sign negative. Coordination normal. Finger-Nose-Finger Test and Heel to [facility] Test normal.     Comments:  Cranial 3 through 12 grossly intact.  No dysarthria or facial asymmetry noted.  No sensory deprivation along major nerve distributions of upper or lower extremities.  Muscle strength 5 out of 5 upper and lower extremities.  Radial and posterior tibial pulses full and intact bilaterally.  No lower extremity edema noticed.  Psychiatric:        Mood and Affect: Mood normal.        Behavior: Behavior normal.     ED Results / Procedures / Treatments   Labs (all labs ordered are listed, but only abnormal results are displayed) Labs Reviewed  COMPREHENSIVE METABOLIC PANEL  CBC WITH DIFFERENTIAL/PLATELET    EKG None  Radiology No results found.  Procedures Procedures  {Document cardiac monitor, telemetry assessment procedure when appropriate:1}  Medications Ordered in ED Medications - No data to display  ED Course/ Medical Decision Making/ A&P                           Medical Decision Making Amount and/or Complexity of Data Reviewed Labs: ordered. ECG/medicine tests: ordered.   This patient presents to the ED for concern of syncopal episode, this involves an extensive number of treatment options, and is a complaint that carries with it a high risk of complications and morbidity.  The differential diagnosis includes    Co morbidities that complicate the patient evaluation  Anemia chronic, dementia, diabetes mellitus, hypertension, BPH, hyperlipidemia   Additional history obtained:  Additional  history obtained from EMR External records from outside source obtained and reviewed including CMP from 12/20/2021 as listed above to gauge baseline renal function.   Lab Tests:  I Ordered, and personally interpreted labs.  The pertinent results include:  ***   Imaging Studies ordered:  I ordered imaging studies including ***  I independently visualized and interpreted imaging which showed *** I agree with the radiologist interpretation   Cardiac Monitoring: / EKG:  The patient was maintained on a cardiac monitor.  I personally viewed and interpreted the cardiac monitored which showed an underlying rhythm of: ***   Consultations Obtained:  I requested consultation with the ***,  and discussed lab and imaging findings as well as pertinent plan - they recommend: ***   Problem List / ED Course / Critical interventions / Medication management  *** I ordered medication including ***  for ***  Reevaluation of the patient after these medicines showed that the patient {resolved/improved/worsened:23923::"improved"} I have reviewed the patients home medicines and have made adjustments as needed   Social Determinants of Health:  ***   Test / Admission - Considered:  ***   {Document critical care time when appropriate:1} {Document review of labs and clinical decision tools ie heart score, Chads2Vasc2 etc:1}  {Document your independent review of radiology images, and any outside records:1} {Document your discussion with family members, caretakers, and with consultants:1} {Document social determinants of health affecting pt's care:1} {Document your decision making why or why not admission, treatments were needed:1} Final Clinical Impression(s) / ED Diagnoses Final diagnoses:  None    Rx / DC Orders ED Discharge Orders     None

## 2022-04-19 ENCOUNTER — Ambulatory Visit (INDEPENDENT_AMBULATORY_CARE_PROVIDER_SITE_OTHER): Payer: Medicaid Other | Admitting: Vascular Surgery

## 2022-04-19 ENCOUNTER — Encounter: Payer: Self-pay | Admitting: Vascular Surgery

## 2022-04-19 VITALS — BP 157/81 | HR 72 | Temp 97.5°F | Resp 16 | Ht 70.5 in | Wt 218.6 lb

## 2022-04-19 DIAGNOSIS — N184 Chronic kidney disease, stage 4 (severe): Secondary | ICD-10-CM | POA: Diagnosis not present

## 2022-04-19 LAB — CBG MONITORING, ED: Glucose-Capillary: 154 mg/dL — ABNORMAL HIGH (ref 70–99)
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
## 2022-04-25 ENCOUNTER — Encounter: Payer: Self-pay | Admitting: Podiatry

## 2022-04-25 ENCOUNTER — Ambulatory Visit: Payer: Medicaid Other | Admitting: Podiatry

## 2022-04-25 DIAGNOSIS — M79674 Pain in right toe(s): Secondary | ICD-10-CM

## 2022-04-25 DIAGNOSIS — L84 Corns and callosities: Secondary | ICD-10-CM

## 2022-04-25 DIAGNOSIS — B351 Tinea unguium: Secondary | ICD-10-CM | POA: Diagnosis not present

## 2022-04-25 DIAGNOSIS — M79675 Pain in left toe(s): Secondary | ICD-10-CM

## 2022-04-25 DIAGNOSIS — M2011 Hallux valgus (acquired), right foot: Secondary | ICD-10-CM

## 2022-04-25 DIAGNOSIS — E1142 Type 2 diabetes mellitus with diabetic polyneuropathy: Secondary | ICD-10-CM

## 2022-04-26 ENCOUNTER — Ambulatory Visit: Payer: Medicaid Other | Admitting: Vascular Surgery

## 2022-04-28 ENCOUNTER — Ambulatory Visit: Payer: Medicaid Other | Admitting: Internal Medicine

## 2022-04-28 ENCOUNTER — Other Ambulatory Visit: Payer: Self-pay | Admitting: Internal Medicine

## 2022-04-28 ENCOUNTER — Telehealth: Payer: Self-pay

## 2022-04-28 DIAGNOSIS — G609 Hereditary and idiopathic neuropathy, unspecified: Secondary | ICD-10-CM

## 2022-04-28 NOTE — Telephone Encounter (Signed)
There is not a potassium pill on his list that I saw if this is a new medication he will need to schedule an appt with provider if this is a refill on medication and he cant remember name he can call the pharmacy and have them request a refill for him

## 2022-04-28 NOTE — Telephone Encounter (Signed)
Left voice mail for patient to call our office.

## 2022-04-28 NOTE — Telephone Encounter (Signed)
I called patient about his missed appointment today, he said he just saw other provider 04/14/2022 and I explain needs to reschedule for July but he said he was too busy right now he just needs his potassium pills he keeps running to the bathroom.  He does not know the name of the medicine.  He uses [facility]

## 2022-07-26 ENCOUNTER — Ambulatory Visit: Payer: Medicaid Other | Admitting: Podiatry

## 2022-07-26 ENCOUNTER — Encounter: Payer: Self-pay | Admitting: Podiatry

## 2022-07-26 DIAGNOSIS — M79674 Pain in right toe(s): Secondary | ICD-10-CM | POA: Diagnosis not present

## 2022-07-26 DIAGNOSIS — B351 Tinea unguium: Secondary | ICD-10-CM

## 2022-07-26 DIAGNOSIS — M79675 Pain in left toe(s): Secondary | ICD-10-CM | POA: Diagnosis not present

## 2022-07-26 DIAGNOSIS — L84 Corns and callosities: Secondary | ICD-10-CM

## 2022-07-26 DIAGNOSIS — M2012 Hallux valgus (acquired), left foot: Secondary | ICD-10-CM

## 2022-07-26 DIAGNOSIS — M2011 Hallux valgus (acquired), right foot: Secondary | ICD-10-CM

## 2022-07-26 NOTE — Progress Notes (Signed)
This patient returns to my office for at risk foot care.  This patient requires this care by a professional since this patient will be at risk due to having diabetes.  Patient has not been seen in over 9 months.  This patient is unable to cut nails himself since the patient cannot reach his nails.These nails are painful walking and wearing shoes.  He has painful callus at inside big toe joint left foot. This patient presents for at risk foot care today.  General Appearance  Alert, conversant and in no acute stress.  Vascular  Dorsalis pedis and posterior tibial  pulses are  weakly palpable  bilaterally.  Capillary return is within normal limits  bilaterally. Temperature is within normal limits  bilaterally.  Neurologic  Senn-Weinstein monofilament wire test within normal limits  bilaterally. Muscle power within normal limits bilaterally.  Nails Thick disfigured discolored nails with subungual debris  from hallux to fifth toes bilaterally. No evidence of bacterial infection or drainage bilaterally.  Orthopedic  No limitations of motion  feet .  No crepitus or effusions noted.  No bony pathology or digital deformities noted.HAV  B/L.  Hammer toes 2-5  B/L.  Skin  normotropic skin with no porokeratosis noted bilaterally.  No signs of infections or ulcers noted.   Callus over dorsomedial exostosis left foot. Clavi second toes  B/L  Onychomycosis  Pain in right toes  Pain in left toes  Callus left foot.  Clavi  B/L.  Consent was obtained for treatment procedures.   Mechanical debridement of nails 1-5  bilaterally performed with a nail nipper.  Filed with dremel without incident. Debride callus left foot.Debride clavi second  B/L.  Padding dispensed.   Return office visit   3 months.                  Told patient to return for periodic foot care and evaluation due to potential at risk complications.   Gardiner Barefoot DPM

## 2022-08-22 ENCOUNTER — Encounter (HOSPITAL_COMMUNITY): Payer: Self-pay | Admitting: *Deleted

## 2022-08-22 ENCOUNTER — Emergency Department (HOSPITAL_COMMUNITY): Payer: Medicaid Other

## 2022-08-22 ENCOUNTER — Observation Stay (HOSPITAL_COMMUNITY)
Admission: EM | Admit: 2022-08-22 | Discharge: 2022-08-23 | Disposition: A | Discharge status: Home / Self Care | Payer: Medicaid Other | Attending: Internal Medicine | Admitting: Internal Medicine

## 2022-08-22 ENCOUNTER — Encounter (HOSPITAL_COMMUNITY): Payer: Self-pay

## 2022-08-22 ENCOUNTER — Other Ambulatory Visit: Payer: Self-pay

## 2022-08-22 DIAGNOSIS — E1122 Type 2 diabetes mellitus with diabetic chronic kidney disease: Secondary | ICD-10-CM | POA: Insufficient documentation

## 2022-08-22 DIAGNOSIS — G609 Hereditary and idiopathic neuropathy, unspecified: Secondary | ICD-10-CM | POA: Diagnosis not present

## 2022-08-22 DIAGNOSIS — Z7982 Long term (current) use of aspirin: Secondary | ICD-10-CM | POA: Insufficient documentation

## 2022-08-22 DIAGNOSIS — R791 Abnormal coagulation profile: Secondary | ICD-10-CM | POA: Diagnosis not present

## 2022-08-22 DIAGNOSIS — F17210 Nicotine dependence, cigarettes, uncomplicated: Secondary | ICD-10-CM | POA: Diagnosis not present

## 2022-08-22 DIAGNOSIS — F0390 Unspecified dementia without behavioral disturbance: Secondary | ICD-10-CM | POA: Diagnosis not present

## 2022-08-22 DIAGNOSIS — I120 Hypertensive chronic kidney disease with stage 5 chronic kidney disease or end stage renal disease: Secondary | ICD-10-CM | POA: Diagnosis not present

## 2022-08-22 DIAGNOSIS — Z79899 Other long term (current) drug therapy: Secondary | ICD-10-CM | POA: Insufficient documentation

## 2022-08-22 DIAGNOSIS — N185 Chronic kidney disease, stage 5: Secondary | ICD-10-CM | POA: Diagnosis not present

## 2022-08-22 DIAGNOSIS — K219 Gastro-esophageal reflux disease without esophagitis: Secondary | ICD-10-CM

## 2022-08-22 DIAGNOSIS — R079 Chest pain, unspecified: Secondary | ICD-10-CM | POA: Diagnosis not present

## 2022-08-22 DIAGNOSIS — R0789 Other chest pain: Secondary | ICD-10-CM | POA: Diagnosis present

## 2022-08-22 DIAGNOSIS — B9681 Helicobacter pylori [H. pylori] as the cause of diseases classified elsewhere: Secondary | ICD-10-CM

## 2022-08-22 LAB — BASIC METABOLIC PANEL
Anion gap: 5 (ref 5–15)
BUN: 37 mg/dL — ABNORMAL HIGH (ref 8–23)
CO2: 21 mmol/L — ABNORMAL LOW (ref 22–32)
Calcium: 9.1 mg/dL (ref 8.9–10.3)
Chloride: 115 mmol/L — ABNORMAL HIGH (ref 98–111)
Creatinine, Ser: 5.31 mg/dL — ABNORMAL HIGH (ref 0.61–1.24)
GFR, Estimated: 11 mL/min — ABNORMAL LOW (ref >60)
Glucose, Bld: 133 mg/dL — ABNORMAL HIGH (ref 70–99)
Potassium: 4.3 mmol/L (ref 3.5–5.1)
Sodium: 141 mmol/L (ref 135–145)

## 2022-08-22 LAB — CBC
HCT: 33.4 % — ABNORMAL LOW (ref 39.0–52.0)
Hemoglobin: 10.7 g/dL — ABNORMAL LOW (ref 13.0–17.0)
MCH: 30.9 pg (ref 26.0–34.0)
MCHC: 32 g/dL (ref 30.0–36.0)
MCV: 96.5 fL (ref 80.0–100.0)
Platelets: 183 10*3/uL (ref 150–400)
RBC: 3.46 MIL/uL — ABNORMAL LOW (ref 4.22–5.81)
RDW: 14.3 % (ref 11.5–15.5)
WBC: 5.6 10*3/uL (ref 4.0–10.5)
nRBC: 0 % (ref 0.0–0.2)

## 2022-08-22 LAB — TROPONIN I (HIGH SENSITIVITY)
Troponin I (High Sensitivity): 27 ng/L — ABNORMAL HIGH (ref <18)
Troponin I (High Sensitivity): 27 ng/L — ABNORMAL HIGH (ref <18)

## 2022-08-22 LAB — D-DIMER, QUANTITATIVE: D-Dimer, Quant: 1.51 ug/mL-FEU — ABNORMAL HIGH (ref 0.00–0.50)

## 2022-08-22 MED ORDER — TECHNETIUM TO 99M ALBUMIN AGGREGATED
4.4000 | Freq: Once | INTRAVENOUS | Status: AC | PRN
  Administered 2022-08-22: 4.4 via INTRAVENOUS

## 2022-08-22 MED ORDER — ASPIRIN 325 MG PO TABS
325.0000 mg | ORAL_TABLET | Freq: Once | ORAL | Status: AC
  Administered 2022-08-22: 325 mg via ORAL
  Filled 2022-08-22: qty 1

## 2022-08-22 NOTE — ED Triage Notes (Signed)
[facility] reported pt called EMS for dizziness while walking, ongoing x 2 days. Reported pt not able to give history on medical.  Denies any pain at present but had some CP PTA.  + emesis x 1 prior ot EMS arrival per pt.

## 2022-08-22 NOTE — H&P (Signed)
TRH H&P    Patient Demographics:    Charles [name], is a 66 y.o. male  MRN: [id number]  DOB - [birth year]  Admit Date - 08/22/2022  Referring MD/NP/PA:--Matthew Trifan  Patient coming from: Home  Chief complaint-chest pain   HPI:    Charles [name]  is a 66 y.o. male, with history of CKD stage V, hypertension, diabetes mellitus type 2, schizophrenia who came to ED with complaints of chest pain, vomiting and dizziness.  Patient said that he was in class today when he started feeling dizzy and substernal chest pain.  Pain lasted about 15 minutes and also had vomiting.  Denies passing out. Denies shortness of breath, diaphoresis. Pain was substernal with no radiation. No previous history of CAD. In the ED patient was found to have elevated D-dimer of 1.51, nuclear perfusion scan was negative for PE. Troponin was 27, 27.  EKG unremarkable. ED provider spoke to cardiology Dr. Johney Frame, who recommended observation admission and then likely stress test in a.m.  Patient denies chest pain at this time Denies heartburn. No abdominal pain or dysuria    Review of systems:    In addition to the HPI above,    All other systems reviewed and are negative.    Past History of the following :    Past Medical History:  Diagnosis Date   Anemia    Anxiety    Chronic back pain    Dementia    Nursing facility feels he has Dementia   Diabetes mellitus    Facial numbness    Fracture of right foot    Hypertension    Mental disorder    schizophrenia      Past Surgical History:  Procedure Laterality Date   AV FISTULA PLACEMENT Left 12/20/2021   Procedure: LEFT ARM ARTERIOVENOUS (AV) FISTULA CREATION;  Surgeon: Rosetta Posner, MD;  Location: [facility];  Service: Vascular;  Laterality: Left;   BIOPSY  08/02/2021   Procedure: BIOPSY;  Surgeon: Harvel Quale, MD;  [facility]: [facility];  Service:  Gastroenterology;;  duodenal and gastric biopsies   COLONOSCOPY N/A 04/11/2018   Procedure: COLONOSCOPY;  Surgeon: Rogene Houston, MD;  [facility]: [facility];  Service: Endoscopy;  Laterality: N/A;  10:30   COLONOSCOPY WITH PROPOFOL N/A 07/05/2018   Procedure: COLONOSCOPY WITH PROPOFOL;  Surgeon: Rogene Houston, MD;  [facility];  Service: Endoscopy;  Laterality: N/A;  10:35   COLONOSCOPY WITH PROPOFOL N/A 08/02/2021   Procedure: COLONOSCOPY WITH PROPOFOL;  Surgeon: Harvel Quale, MD;  [facility];  Service: Gastroenterology;  Laterality: N/A;  9:05   ESOPHAGOGASTRODUODENOSCOPY (EGD) WITH PROPOFOL N/A 08/02/2021   Procedure: ESOPHAGOGASTRODUODENOSCOPY (EGD) WITH PROPOFOL;  Surgeon: Harvel Quale, MD;  [facility];  Service: Gastroenterology;  Laterality: N/A;   left 1st toe     ORIF TOE FRACTURE  09/05/2011   Procedure: OPEN REDUCTION INTERNAL FIXATION (ORIF) METATARSAL (TOE) FRACTURE;  Surgeon: Colin Rhein;  Location: [facility];  Service: Orthopedics;  Laterality: Left;  reconstruction left great toe FHB plantar plate   POLYPECTOMY  04/11/2018   Procedure: POLYPECTOMY;  Surgeon: Rogene Houston, MD;  [facility];  Service: Endoscopy;;  colon   POLYPECTOMY  07/05/2018   Procedure: POLYPECTOMY;  Surgeon: Rogene Houston, MD;  [facility];  Service: Endoscopy;;  colon   POLYPECTOMY  08/02/2021   Procedure: POLYPECTOMY;  Surgeon: Montez Morita, Quillian Quince, MD;  Location[facility];  Service: Gastroenterology;;  transverse colon polyp x 1 ascending colon polyp   right foot suger        Social History:      Social History   Tobacco Use   Smoking status: Every Day    Packs/day: 0.25    Years: 40.00    Total pack years: 10.00    Types: Cigarettes   Smokeless tobacco: Never  Substance Use Topics   Alcohol use: Yes    Comment: every other day       Family History :     Family  History  Problem Relation Age of Onset   Diabetes Brother    Colon cancer Neg Hx    Liver disease Neg Hx        unknown for sure, mom died at age 28      Home Medications:   Prior to Admission medications   Medication Sig Start Date End Date Taking? Authorizing Provider  acetaminophen (TYLENOL 8 HOUR) 650 MG CR tablet Take 1 tablet (650 mg total) by mouth every 8 (eight) hours as needed for pain or fever. 07/28/20  Yes Varney Biles, MD  aspirin EC 81 MG tablet Take 1 tablet (81 mg total) by mouth daily with breakfast. 07/15/18  Yes Rehman, Mechele Dawley, MD  carvedilol (COREG) 25 MG tablet Take 25 mg by mouth 2 (two) times daily. 11/29/21  Yes [provider]  Cholecalciferol 25 MCG (1000 UT) capsule Take 5,000 Units by mouth daily.   Yes [provider]  diclofenac Sodium (VOLTAREN) 1 % GEL Apply 2 g topically 4 (four) times daily.   Yes [provider]  ferrous sulfate 325 (65 FE) MG tablet Take 325 mg by mouth daily with breakfast.   Yes [provider]  gabapentin (NEURONTIN) 300 MG capsule TAKE (1) CAPSULE BY MOUTH THREE TIMES DAILY. Patient taking differently: Take 300 mg by mouth 3 (three) times daily. 04/28/22  Yes Lindell Spar, MD  NIFEdipine (PROCARDIA XL/NIFEDICAL XL) 60 MG 24 hr tablet Take by mouth. 03/16/22 03/16/23 Yes [provider]  OVER THE COUNTER MEDICATION D 3 5,000 IU once per day. In addition to five 1,000 IU daily.   Yes [provider]  calcitRIOL (ROCALTROL) 0.25 MCG capsule Take 0.25 mcg by mouth 3 (three) times a week. Patient not taking: Reported on 08/22/2022    [provider]  calcitRIOL (ROCALTROL) 0.25 MCG capsule Take by mouth. Patient not taking: Reported on 08/22/2022 07/26/22 07/26/23  [provider]  furosemide (LASIX) 40 MG tablet Take 20 mg by mouth 2 (two) times daily. Patient not taking: Reported on 04/13/2022    [provider]  losartan (COZAAR) 25 MG tablet Take 25 mg  by mouth daily. Patient not taking: Reported on 04/13/2022    [provider]  NIFEdipine (ADALAT CC) 60 MG 24 hr tablet Take 60 mg by mouth daily. Patient not taking: Reported on 08/22/2022 03/16/22   [provider]  omeprazole (PRILOSEC) 40 MG capsule Take 1 capsule (40 mg total) by  mouth 2 (two) times daily for 14 days. Patient not taking: Reported on 08/22/2022 08/04/21 08/18/21  Harvel Quale, MD     Allergies:    No Known Allergies   Physical Exam:   Vitals  Blood pressure (!) 147/74, pulse 71, temperature 98.1 F (36.7 C), temperature source Oral, resp. rate 20, weight 104.3 kg, SpO2 100 %.  1.  General: Appears in no acute distress  2. Psychiatric: Alert, oriented x3, intact insight and judgment  3. Neurologic: Cranial nerves II through XII grossly intact, no focal deficit noted  4. HEENMT:  Atraumatic normocephalic, extraocular muscle intact  5. Respiratory : Clear to auscultation bilaterally  6. Cardiovascular : S1-S2, regular, no murmur auscultated  7. Gastrointestinal:  Abdomen soft, nontender, no organomegaly  8. Skin:  Skin is warm to touch, no rashes noted  9.Musculoskeletal:  No edema in the lower extremities    Data Review:    CBC Recent Labs  Lab 08/22/22 1212  WBC 5.6  HGB 10.7*  HCT 33.4*  PLT 183  MCV 96.5  MCH 30.9  MCHC 32.0  RDW 14.3   ------------------------------------------------------------------------------------------------------------------  Results for orders placed or performed during the hospital encounter of 08/22/22 (from the past 48 hour(s))  Basic metabolic panel     Status: Abnormal   Collection Time: 08/22/22 12:12 PM  Result Value Ref Range   Sodium 141 135 - 145 mmol/L   Potassium 4.3 3.5 - 5.1 mmol/L   Chloride 115 (H) 98 - 111 mmol/L   CO2 21 (L) 22 - 32 mmol/L   Glucose, Bld 133 (H) 70 - 99 mg/dL    Comment: Glucose reference range applies only to samples taken after  fasting for at least 8 hours.   BUN 37 (H) 8 - 23 mg/dL   Creatinine, Ser 5.31 (H) 0.61 - 1.24 mg/dL   Calcium 9.1 8.9 - 10.3 mg/dL   GFR, Estimated 11 (L) >60 mL/min    Comment: (NOTE) Calculated using the CKD-EPI Creatinine Equation (2021)    Anion gap 5 5 - 15    Comment: Performed at [facility], [address] [place] [place], [place] [address]  CBC     Status: Abnormal   Collection Time: 08/22/22 12:12 PM  Result Value Ref Range   WBC 5.6 4.0 - 10.5 K/uL   RBC 3.46 (L) 4.22 - 5.81 MIL/uL   Hemoglobin 10.7 (L) 13.0 - 17.0 g/dL   HCT 33.4 (L) 39.0 - 52.0 %   MCV 96.5 80.0 - 100.0 fL   MCH 30.9 26.0 - 34.0 pg   MCHC 32.0 30.0 - 36.0 g/dL   RDW 14.3 11.5 - 15.5 %   Platelets 183 150 - 400 K/uL   nRBC 0.0 0.0 - 0.2 %    Comment: Performed at [facility], [address] [place], [place] [address]  Troponin I (High Sensitivity)     Status: Abnormal   Collection Time: 08/22/22 12:12 PM  Result Value Ref Range   Troponin I (High Sensitivity) 27 (H) <18 ng/L    Comment: (NOTE) Elevated high sensitivity troponin I (hsTnI) values and significant  changes across serial measurements may suggest ACS but many other  chronic and acute conditions are known to elevate hsTnI results.  Refer to the "Links" section for chest pain algorithms and additional  guidance. Performed at [facility], [address], [place], [place] [address]   D-dimer, quantitative     Status: Abnormal   Collection Time: 08/22/22 12:12 PM  Result  Value Ref Range   D-Dimer, Quant 1.51 (H) 0.00 - 0.50 ug/mL-FEU    Comment: (NOTE) At the manufacturer cut-off value of 0.5 g/mL FEU, this assay has a negative predictive value of 95-100%.This assay is intended for use in conjunction with a clinical pretest probability (PTP) assessment model to exclude pulmonary embolism (PE) and deep venous thrombosis (DVT) in outpatients suspected of PE or DVT. Results should be correlated with clinical presentation. Performed at [facility], [address] [place], [place] [address]   Troponin I (High Sensitivity)     Status: Abnormal   Collection Time: 08/22/22  2:41 PM  Result Value Ref Range   Troponin I (High Sensitivity) 27 (H) <18 ng/L    Comment: (NOTE) Elevated high sensitivity troponin I (hsTnI) values and significant  changes across serial measurements may suggest ACS but many other  chronic and acute conditions are known to elevate hsTnI results.  Refer to the "Links" section for chest pain algorithms and additional  guidance. Performed at [facility], [address], [place], [place] [address]     Chemistries  Recent Labs  Lab 08/22/22 1212  NA 141  K 4.3  CL 115*  CO2 21*  GLUCOSE 133*  BUN 37*  CREATININE 5.31*  CALCIUM 9.1   ------------------------------------------------------------------------------------------------------------------  ------------------------------------------------------------------------------------------------------------------ GFR: Estimated Creatinine Clearance: 17.1 mL/min (A) (by C-G formula based on SCr of 5.31 mg/dL (H)). Liver Function Tests: No results for input(s): "AST", "ALT", "ALKPHOS", "BILITOT", "PROT", "ALBUMIN" in the last 168 hours. No results for input(s): "LIPASE", "AMYLASE" in the last 168 hours. No results for input(s): "AMMONIA" in the last 168 hours. Coagulation Profile: No results for input(s): "INR", "PROTIME" in the last 168 hours. Cardiac Enzymes: No results for input(s): "CKTOTAL", "CKMB", "CKMBINDEX", "TROPONINI" in the last 168 hours. BNP (last 3 results) No results for input(s): "PROBNP" in the last 8760 hours. HbA1C: No results for input(s): "HGBA1C" in the last 72 hours. CBG: No results for input(s): "GLUCAP" in the last 168 hours. Lipid Profile: No results for input(s): "CHOL", "HDL", "LDLCALC", "TRIG", "CHOLHDL", "LDLDIRECT" in the last 72 hours. Thyroid Function Tests: No results for input(s): "TSH", "T4TOTAL", "FREET4",  "T3FREE", "THYROIDAB" in the last 72 hours. Anemia Panel: No results for input(s): "VITAMINB12", "FOLATE", "FERRITIN", "TIBC", "IRON", "RETICCTPCT" in the last 72 hours.  --------------------------------------------------------------------------------------------------------------- Urine analysis:    Component Value Date/Time   COLORURINE YELLOW 03/26/2013 1830   APPEARANCEUR CLEAR 03/26/2013 1830   LABSPEC 1.025 03/26/2013 1830   PHURINE 5.5 03/26/2013 1830   GLUCOSEU >1000 (A) 03/26/2013 1830   HGBUR NEGATIVE 03/26/2013 1830   BILIRUBINUR SMALL (A) 03/26/2013 1830   KETONESUR 15 (A) 03/26/2013 1830   PROTEINUR 30 (A) 03/26/2013 1830   UROBILINOGEN 1.0 03/26/2013 1830   NITRITE NEGATIVE 03/26/2013 1830   LEUKOCYTESUR NEGATIVE 03/26/2013 1830      Imaging Results:    NM Pulmonary Perfusion  Result Date: 08/22/2022 CLINICAL DATA:  66 year old male with suspected pulmonary embolism EXAM: NUCLEAR MEDICINE PERFUSION LUNG SCAN TECHNIQUE: Perfusion images were obtained in multiple projections after intravenous injection of radiopharmaceutical. Ventilation scans intentionally deferred if perfusion scan and chest x-ray adequate for interpretation. RADIOPHARMACEUTICALS:  4.4 mCi Tc-101mMAA IV COMPARISON:  Chest x-ray 08/22/2022 FINDINGS: Scintigraphic planar imaging performed after administration of the radiopharmaceutical in standard anterior posterior and oblique positions. No large filling defects identified, with some attenuation of the uptake on the lateral views secondary to overlying soft tissues of the body wall. IMPRESSION: Perfusion nuclear medicine study negative for  evidence of pulmonary emboli. Electronically Signed   By: Corrie Mckusick D.O.   On: 08/22/2022 15:42   DG Chest 2 View  Result Date: 08/22/2022 CLINICAL DATA:  Chest pain with dizziness for 2 days. EXAM: CHEST - 2 VIEW COMPARISON:  Radiographs 03/18/2021 and 12/03/2020.  CT 08/20/2011. FINDINGS: The heart size and  mediastinal contours are stable. The lungs are clear. There is no pleural effusion or pneumothorax. Old injury of the distal right clavicle and adjacent coracoclavicular ligaments is unchanged. There are prominent osteophytes throughout the thoracic spine. No acute osseous findings are evident. Telemetry leads overlie the chest. IMPRESSION: Stable chest. No acute cardiopulmonary process. Chronic findings as above. Electronically Signed   By: Richardean Sale M.D.   On: 08/22/2022 12:31    My personal review of EKG: Rhythm NSR, T wave inversions in lead to 3 aVF and V5 V6   Assessment & Plan:    Principal Problem:   Chest pain   Chest pain-resolved, EKG shows T wave changes in lead V5, V6, 2 ,3, aVF.  Will placement observation for possible stress test in a.m.  We will keep n.p.o. after midnight.  We will consult cardiology in a.m.  Continue aspirin 81 mg p.o. daily.  No heparin infusion started at patient is not having chest pain at this time, troponin is 27, 27.  We will continue to cycle troponin. Elevated D-dimer-D-dimer is elevated at 1.51, nuclear perfusion scan was negative.  No CT chest obtained due to CKD stage V. 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  He has a AV fistula in place.  Patient follow up with nephrology as outpatient. Hypertension-blood pressure is stable.  Continue home medication including Coreg.  We will hold Lasix, Cozaar. GERD-continue Protonix 40 mg p.o. daily    DVT Prophylaxis-   heparin  AM Labs Ordered, also please review Full Orders  Family Communication: Admission, patients condition and plan of care including tests being ordered have been discussed with the patient  who indicate understanding and agree with the plan and Code Status.  Code Status: Full code  Admission status: Observation  Time spent in minutes : 60 minutes   Tania Steinhauser S Aarti Mankowski M.D

## 2022-08-22 NOTE — ED Provider Notes (Signed)
66 yo male here with chest discomfort, diaphoresis, vomiting  Hx of CKD, not on dialysis, but pending - has fistula placed ECG unremarkable Delta troponins flat Negative VQ study for PE  *  6 30 pm - I spoke to Dr Johney Frame from cardiology who agrees, given patient's risk factors, for admission for observation and stress test.  With poor kidney function patient is not a good candidate for coronary CT scan.  He can be admitted at [facility] as he remains pain free with no MI on ECG.  Will page hospitalist for admission.   Wyvonnia Dusky, MD 08/22/22 [phone]

## 2022-08-22 NOTE — ED Provider Notes (Signed)
[facility] EMERGENCY DEPARTMENT Provider Note   CSN: [id number] Arrival date & time: 08/22/22  1122     History  Chief Complaint  Patient presents with   Dizziness    Charles [name] is a 66 y.o. male.  66 year old male with a history of CKD, diabetes, and hypertension who presents to the emergency department with dizziness, chest pain, and vomiting.  Patient states that he was in class earlier today and started feeling dizzy with dull substernal chest pain.  Says that it lasted for approximately 15 minutes and then he started vomiting.  Says that it resolved shortly afterwards.  Denies any syncope but felt that he was about to pass out.  Denies any shortness of breath, diaphoresis, double vision, slurred speech, difficulty swallowing or speaking.  No weakness or numbness of his arms or legs.  Says that he smokes cigarettes and drinks occasionally.  Does not have any immediate family members with a history of MI and does not personally have a history of CAD.  No history of DVT/PE and no leg swelling.  Reports that he has a fistula placed but has not yet started on dialysis.  Of note, EMS did notice an irregular heart rhythm which is below.   Past Medical History:  Diagnosis Date   Anemia    Anxiety    Chronic back pain    Dementia    Nursing facility feels he has Dementia   Diabetes mellitus    Facial numbness    Fracture of right foot    Hypertension    Mental disorder    schizophrenia       Home Medications Prior to Admission medications   Medication Sig Start Date End Date Taking? Authorizing Provider  acetaminophen (TYLENOL 8 HOUR) 650 MG CR tablet Take 1 tablet (650 mg total) by mouth every 8 (eight) hours as needed for pain or fever. 07/28/20  Yes Varney Biles, MD  aspirin EC 81 MG tablet Take 1 tablet (81 mg total) by mouth daily with breakfast. 07/15/18  Yes Rehman, Mechele Dawley, MD  carvedilol (COREG) 25 MG tablet Take 25 mg by mouth 2 (two) times daily. 11/29/21   Yes [provider]  Cholecalciferol 25 MCG (1000 UT) capsule Take 5,000 Units by mouth daily.   Yes [provider]  diclofenac Sodium (VOLTAREN) 1 % GEL Apply 2 g topically 4 (four) times daily.   Yes [provider]  ferrous sulfate 325 (65 FE) MG tablet Take 325 mg by mouth daily with breakfast.   Yes [provider]  gabapentin (NEURONTIN) 300 MG capsule TAKE (1) CAPSULE BY MOUTH THREE TIMES DAILY. Patient taking differently: Take 300 mg by mouth 3 (three) times daily. 04/28/22  Yes Lindell Spar, MD  NIFEdipine (PROCARDIA XL/NIFEDICAL XL) 60 MG 24 hr tablet Take by mouth. 03/16/22 03/16/23 Yes [provider]  OVER THE COUNTER MEDICATION D 3 5,000 IU once per day. In addition to five 1,000 IU daily.   Yes [provider]  calcitRIOL (ROCALTROL) 0.25 MCG capsule Take 0.25 mcg by mouth 3 (three) times a week. Patient not taking: Reported on 08/22/2022    [provider]  calcitRIOL (ROCALTROL) 0.25 MCG capsule Take by mouth. Patient not taking: Reported on 08/22/2022 07/26/22 07/26/23  [provider]  furosemide (LASIX) 40 MG tablet Take 20 mg by mouth 2 (two) times daily. Patient not taking: Reported on 04/13/2022    [provider]  losartan (COZAAR) 25 MG tablet Take 25  mg by mouth daily. Patient not taking: Reported on 04/13/2022    [provider]  NIFEdipine (ADALAT CC) 60 MG 24 hr tablet Take 60 mg by mouth daily. Patient not taking: Reported on 08/22/2022 03/16/22   [provider]  omeprazole (PRILOSEC) 40 MG capsule Take 1 capsule (40 mg total) by mouth 2 (two) times daily for 14 days. Patient not taking: Reported on 08/22/2022 08/04/21 08/18/21  Harvel Quale, MD      Allergies    Patient has no known allergies.    Review of Systems   Review of Systems  Physical Exam Updated Vital Signs BP (!) 147/74   Pulse 71   Temp 98.1 F (36.7 C) (Oral)   Resp 20   Wt 104.3  kg   SpO2 100%   BMI 32.54 kg/m  Physical Exam Vitals and nursing note reviewed.  Constitutional:      General: He is not in acute distress.    Appearance: He is well-developed.  HENT:     Head: Normocephalic and atraumatic.     Right Ear: External ear normal.     Left Ear: External ear normal.     Nose: Nose normal.  Eyes:     Extraocular Movements: Extraocular movements intact.     Conjunctiva/sclera: Conjunctivae normal.     Pupils: Pupils are equal, round, and reactive to light.  Cardiovascular:     Rate and Rhythm: Normal rate and regular rhythm.     Heart sounds: Normal heart sounds.  Pulmonary:     Effort: Pulmonary effort is normal. No respiratory distress.     Breath sounds: Normal breath sounds.  Abdominal:     General: There is no distension.     Palpations: Abdomen is soft. There is no mass.     Tenderness: There is no abdominal tenderness. There is no guarding.  Musculoskeletal:        General: No swelling.     Cervical back: Normal range of motion and neck supple.     Right lower leg: No edema.     Left lower leg: No edema.  Skin:    General: Skin is warm and dry.     Capillary Refill: Capillary refill takes less than 2 seconds.  Neurological:     Mental Status: He is alert. Mental status is at baseline.  Psychiatric:        Mood and Affect: Mood normal.        Behavior: Behavior normal.     ED Results / Procedures / Treatments   Labs (all labs ordered are listed, but only abnormal results are displayed) Labs Reviewed  BASIC METABOLIC PANEL - Abnormal; Notable for the following components:      Result Value   Chloride 115 (*)    CO2 21 (*)    Glucose, Bld 133 (*)    BUN 37 (*)    Creatinine, Ser 5.31 (*)    GFR, Estimated 11 (*)    All other components within normal limits  CBC - Abnormal; Notable for the following components:   RBC 3.46 (*)    Hemoglobin 10.7 (*)    HCT 33.4 (*)    All other components within normal limits  D-DIMER,  QUANTITATIVE - Abnormal; Notable for the following components:   D-Dimer, Quant 1.51 (*)    All other components within normal limits  TROPONIN I (HIGH SENSITIVITY) - Abnormal; Notable for the following components:   Troponin I (High Sensitivity) 27 (*)  All other components within normal limits  TROPONIN I (HIGH SENSITIVITY) - Abnormal; Notable for the following components:   Troponin I (High Sensitivity) 27 (*)    All other components within normal limits    EKG EKG Interpretation  Date/Time:  Tuesday August 22 2022 11:30:00 EDT Ventricular Rate:  56 PR Interval:  209 QRS Duration: 97 QT Interval:  433 QTC Calculation: 418 R Axis:   -49 Text Interpretation: Sinus rhythm Left anterior fascicular block Abnormal R-wave progression, late transition Borderline T abnormalities, diffuse leads When compared to 04/18/22 no significant change Confirmed by Margaretmary Eddy [phone]) on 08/22/2022 11:51:54 AM  EMS Rhythm Strip    Radiology NM Pulmonary Perfusion  Result Date: 08/22/2022 CLINICAL DATA:  66 year old male with suspected pulmonary embolism EXAM: NUCLEAR MEDICINE PERFUSION LUNG SCAN TECHNIQUE: Perfusion images were obtained in multiple projections after intravenous injection of radiopharmaceutical. Ventilation scans intentionally deferred if perfusion scan and chest x-ray adequate for interpretation. RADIOPHARMACEUTICALS:  4.4 mCi Tc-67mMAA IV COMPARISON:  Chest x-ray 08/22/2022 FINDINGS: Scintigraphic planar imaging performed after administration of the radiopharmaceutical in standard anterior posterior and oblique positions. No large filling defects identified, with some attenuation of the uptake on the lateral views secondary to overlying soft tissues of the body wall. IMPRESSION: Perfusion nuclear medicine study negative for evidence of pulmonary emboli. Electronically Signed   By: JCorrie MckusickD.O.   On: 08/22/2022 15:42   DG Chest 2 View  Result Date: 08/22/2022 CLINICAL  DATA:  Chest pain with dizziness for 2 days. EXAM: CHEST - 2 VIEW COMPARISON:  Radiographs 03/18/2021 and 12/03/2020.  CT 08/20/2011. FINDINGS: The heart size and mediastinal contours are stable. The lungs are clear. There is no pleural effusion or pneumothorax. Old injury of the distal right clavicle and adjacent coracoclavicular ligaments is unchanged. There are prominent osteophytes throughout the thoracic spine. No acute osseous findings are evident. Telemetry leads overlie the chest. IMPRESSION: Stable chest. No acute cardiopulmonary process. Chronic findings as above. Electronically Signed   By: WRichardean SaleM.D.   On: 08/22/2022 12:31    Procedures Procedures   Medications Ordered in ED Medications  technetium albumin aggregated (MAA) injection solution 4.4 millicurie (4.4 millicuries Intravenous Contrast Given 08/22/22 1515)  aspirin tablet 325 mg (325 mg Oral Given 08/22/22 1718)    ED Course/ Medical Decision Making/ A&P Clinical Course as of 08/22/22 1933  Tue Aug 22, 2022  1546 VQ negative for pulmonary embolism. [RP]    Clinical Course User Index [RP] PFransico Meadow MD                           Medical Decision Making Amount and/or Complexity of Data Reviewed Labs: ordered. Radiology: ordered.  Risk OTC drugs. Decision regarding hospitalization.   JLANDO ALCALDEis a 66y.o. male with comorbidities that complicate the patient evaluation including CKD, diabetes, and hypertension who presents with chief complaint of chest pain, vomiting, and dizziness.  This patient presents to the ED for concern of complaints listed in HPI, this involves an extensive number of treatment options, and is a complaint that carries with it a high risk of complications and morbidity. Disposition including potential need for admission considered.   Initial Ddx:  MI, arrhythmia, PE, vasovagal syncope  MDM:  Patient's history concerning for possible MI given the vomiting and dizziness  that he was having.  Also could be due to arrhythmia.  EMS strip initially was concerning for possible heart  block but upon further review appears to show nonconducted PACs instead.  Considering pulmonary embolism with the patient's shortness of breath that he was having.  Considered vasovagal syncope but feel this is less consistent with his presentation.  Plan:  Labs Troponin D-dimer EKG Chest x-ray  ED Summary/Re-evaluation:  Patient underwent the above work-up which showed stably elevated but flat troponins.  EKG did show T wave inversions in the anterolateral and inferior leads which appears to be similar to prior EKGs. Chest x-ray did not reveal cause of the patient's symptoms.  Cardiology was consulted and we are awaiting their response at the time of signout to Dr. Langston Masker. Pt also loaded with ASA.   Dispo: Admit to Floor   Records reviewed Care Everywhere The following labs were independently interpreted: Chemistry, Serial Troponins, and CBC and show chronic anemia, CKD, and stable troponins not concerning for ongoing ischemia  I independently reviewed the following imaging with scope of interpretation limited to determining acute life threatening conditions related to emergency care: Chest x-ray, which revealed no acute abnormality  I personally reviewed and interpreted cardiac monitoring: normal sinus rhythm  I personally reviewed and interpreted the pt's EKG: see above for interpretation  I have reviewed the patients home medications and made adjustments as needed Consults: Cardiology  Final Clinical Impression(s) / ED Diagnoses Final diagnoses:  Chest pain, unspecified type    Rx / DC Orders ED Discharge Orders     None         Fransico Meadow, MD 08/22/22 1933

## 2022-08-23 ENCOUNTER — Other Ambulatory Visit: Payer: Self-pay

## 2022-08-23 ENCOUNTER — Observation Stay (HOSPITAL_BASED_OUTPATIENT_CLINIC_OR_DEPARTMENT_OTHER): Payer: Medicaid Other

## 2022-08-23 ENCOUNTER — Telehealth: Payer: Self-pay

## 2022-08-23 ENCOUNTER — Encounter (HOSPITAL_COMMUNITY): Payer: Self-pay | Admitting: Family Medicine

## 2022-08-23 ENCOUNTER — Other Ambulatory Visit (HOSPITAL_COMMUNITY): Payer: Self-pay | Admitting: *Deleted

## 2022-08-23 DIAGNOSIS — I10 Essential (primary) hypertension: Secondary | ICD-10-CM | POA: Diagnosis not present

## 2022-08-23 DIAGNOSIS — K219 Gastro-esophageal reflux disease without esophagitis: Secondary | ICD-10-CM | POA: Diagnosis not present

## 2022-08-23 DIAGNOSIS — R079 Chest pain, unspecified: Secondary | ICD-10-CM | POA: Diagnosis not present

## 2022-08-23 DIAGNOSIS — R072 Precordial pain: Secondary | ICD-10-CM

## 2022-08-23 DIAGNOSIS — N185 Chronic kidney disease, stage 5: Secondary | ICD-10-CM

## 2022-08-23 DIAGNOSIS — Z720 Tobacco use: Secondary | ICD-10-CM

## 2022-08-23 DIAGNOSIS — G609 Hereditary and idiopathic neuropathy, unspecified: Secondary | ICD-10-CM

## 2022-08-23 DIAGNOSIS — E785 Hyperlipidemia, unspecified: Secondary | ICD-10-CM

## 2022-08-23 LAB — HIV ANTIBODY (ROUTINE TESTING W REFLEX): HIV Screen 4th Generation wRfx: NONREACTIVE

## 2022-08-23 LAB — LIPID PANEL
Cholesterol: 179 mg/dL (ref 0–200)
HDL: 62 mg/dL (ref >40)
LDL Cholesterol: 91 mg/dL (ref 0–99)
Total CHOL/HDL Ratio: 2.9 RATIO
Triglycerides: 129 mg/dL (ref <150)
VLDL: 26 mg/dL (ref 0–40)

## 2022-08-23 LAB — ECHOCARDIOGRAM COMPLETE
Area-P 1/2: 3.72 cm2
Height: 70.5 in
S' Lateral: 3.3 cm
Weight: 3139.35 oz

## 2022-08-23 LAB — CBC
HCT: 32.3 % — ABNORMAL LOW (ref 39.0–52.0)
Hemoglobin: 10.3 g/dL — ABNORMAL LOW (ref 13.0–17.0)
MCH: 30.6 pg (ref 26.0–34.0)
MCHC: 31.9 g/dL (ref 30.0–36.0)
MCV: 95.8 fL (ref 80.0–100.0)
Platelets: 190 10*3/uL (ref 150–400)
RBC: 3.37 MIL/uL — ABNORMAL LOW (ref 4.22–5.81)
RDW: 14.2 % (ref 11.5–15.5)
WBC: 5.7 10*3/uL (ref 4.0–10.5)
nRBC: 0 % (ref 0.0–0.2)

## 2022-08-23 LAB — COMPREHENSIVE METABOLIC PANEL
ALT: 12 U/L (ref 0–44)
AST: 13 U/L — ABNORMAL LOW (ref 15–41)
Albumin: 3.4 g/dL — ABNORMAL LOW (ref 3.5–5.0)
Alkaline Phosphatase: 58 U/L (ref 38–126)
Anion gap: 9 (ref 5–15)
BUN: 35 mg/dL — ABNORMAL HIGH (ref 8–23)
CO2: 19 mmol/L — ABNORMAL LOW (ref 22–32)
Calcium: 9.1 mg/dL (ref 8.9–10.3)
Chloride: 114 mmol/L — ABNORMAL HIGH (ref 98–111)
Creatinine, Ser: 4.75 mg/dL — ABNORMAL HIGH (ref 0.61–1.24)
GFR, Estimated: 13 mL/min — ABNORMAL LOW (ref >60)
Glucose, Bld: 77 mg/dL (ref 70–99)
Potassium: 4 mmol/L (ref 3.5–5.1)
Sodium: 142 mmol/L (ref 135–145)
Total Bilirubin: 0.3 mg/dL (ref 0.3–1.2)
Total Protein: 6.3 g/dL — ABNORMAL LOW (ref 6.5–8.1)

## 2022-08-23 LAB — TROPONIN I (HIGH SENSITIVITY)
Troponin I (High Sensitivity): 21 ng/L — ABNORMAL HIGH (ref <18)
Troponin I (High Sensitivity): 20 ng/L — ABNORMAL HIGH (ref <18)

## 2022-08-23 MED ORDER — SODIUM CHLORIDE 0.9% FLUSH: 3.0000 mL | INTRAVENOUS | Status: DC | PRN

## 2022-08-23 MED ORDER — SODIUM CHLORIDE 0.9 % IV SOLN: 250.0000 mL | INTRAVENOUS | Status: DC | PRN

## 2022-08-23 MED ORDER — ASPIRIN 81 MG PO TBEC
81.0000 mg | DELAYED_RELEASE_TABLET | Freq: Every day | ORAL | Status: DC
  Administered 2022-08-23: 81 mg via ORAL
  Filled 2022-08-23: qty 1

## 2022-08-23 MED ORDER — VITAMIN D 25 MCG (1000 UNIT) PO TABS
5000.0000 [IU] | ORAL_TABLET | Freq: Every day | ORAL | Status: DC
  Administered 2022-08-23: 5000 [IU] via ORAL
  Filled 2022-08-23: qty 5

## 2022-08-23 MED ORDER — ONDANSETRON HCL 4 MG/2ML IJ SOLN: 4.0000 mg | Freq: Four times a day (QID) | INTRAMUSCULAR | Status: DC | PRN

## 2022-08-23 MED ORDER — NIFEDIPINE ER OSMOTIC RELEASE 60 MG PO TB24: 60.0000 mg | ORAL_TABLET | Freq: Every day | ORAL | 3 refills | Status: DC

## 2022-08-23 MED ORDER — OMEPRAZOLE 40 MG PO CPDR: 40.0000 mg | DELAYED_RELEASE_CAPSULE | Freq: Two times a day (BID) | ORAL | 1 refills | Status: DC

## 2022-08-23 MED ORDER — CARVEDILOL 12.5 MG PO TABS
25.0000 mg | ORAL_TABLET | Freq: Two times a day (BID) | ORAL | Status: DC
  Administered 2022-08-23 (×2): 25 mg via ORAL
  Filled 2022-08-23 (×2): qty 2

## 2022-08-23 MED ORDER — CALCITRIOL 0.25 MCG PO CAPS: 0.2500 ug | ORAL_CAPSULE | Freq: Every day | ORAL | 11 refills | Status: AC

## 2022-08-23 MED ORDER — SODIUM CHLORIDE 0.9% FLUSH
3.0000 mL | Freq: Two times a day (BID) | INTRAVENOUS | Status: DC
  Administered 2022-08-23 (×2): 3 mL via INTRAVENOUS

## 2022-08-23 MED ORDER — PANTOPRAZOLE SODIUM 40 MG PO TBEC: 40.0000 mg | DELAYED_RELEASE_TABLET | Freq: Every day | ORAL | Status: DC

## 2022-08-23 MED ORDER — GABAPENTIN 300 MG PO CAPS: 300.0000 mg | ORAL_CAPSULE | Freq: Every day | ORAL | 2 refills | Status: DC

## 2022-08-23 MED ORDER — FERROUS SULFATE 325 (65 FE) MG PO TABS
325.0000 mg | ORAL_TABLET | Freq: Every day | ORAL | Status: DC
  Administered 2022-08-23: 325 mg via ORAL
  Filled 2022-08-23: qty 1

## 2022-08-23 MED ORDER — HEPARIN SODIUM (PORCINE) 5000 UNIT/ML IJ SOLN
5000.0000 [IU] | Freq: Three times a day (TID) | INTRAMUSCULAR | Status: DC
  Administered 2022-08-23 (×2): 5000 [IU] via SUBCUTANEOUS
  Filled 2022-08-23 (×2): qty 1

## 2022-08-23 MED ORDER — ACETAMINOPHEN 325 MG PO TABS
650.0000 mg | ORAL_TABLET | Freq: Three times a day (TID) | ORAL | Status: DC | PRN
  Administered 2022-08-23: 650 mg via ORAL
  Filled 2022-08-23: qty 2

## 2022-08-23 MED ORDER — ONDANSETRON HCL 4 MG PO TABS: 4.0000 mg | ORAL_TABLET | Freq: Four times a day (QID) | ORAL | Status: DC | PRN

## 2022-08-23 MED ORDER — PANTOPRAZOLE SODIUM 40 MG PO TBEC
40.0000 mg | DELAYED_RELEASE_TABLET | Freq: Two times a day (BID) | ORAL | Status: DC
  Administered 2022-08-23: 40 mg via ORAL
  Filled 2022-08-23: qty 1

## 2022-08-23 NOTE — Plan of Care (Signed)

## 2022-08-23 NOTE — Consult Note (Addendum)
Cardiology Consultation   Patient ID: Charles [name] MRN: [id number]; DOB: 08-Oct-1956  Admit date: 08/22/2022 Date of Consult: 08/23/2022  PCP:  Lindell Spar, MD   [facility] Cardiologist: New to [facility]   Patient Profile:   Charles [name] is a 66 y.o. male with a hx of Stage 5 CKD (s/p fistula placement but not currently on HD), HTN, HLD and Schizophrenia who is being seen 08/23/2022 for the evaluation of chest pain at the request of Dr. Darrick Meigs.  History of Present Illness:   Charles [name] presented to [facility] ED on 08/22/2022 for evaluation of dizziness and chest discomfort for the past 2 days. In talking with the patient today, he reports being in his normal state of health until the day of admission. Reports he was attending a class and was consuming a snack of corn chips and Beanee Weenees when he developed a discomfort along his sternal region. Unable to further describe the pain, just says it was a constant discomfort along his sternal region. He went to the restroom and vomited and reports this helped with his symptoms. He denies any recurrent chest discomfort overnight or this morning. Reports he does not exercise routinely but rides his scooter around town and picks up "pieces of junk" to resale and denies any recent chest pain or dyspnea on exertion when carrying heavy objects. No recent orthopnea, PND or pitting edema. He is unaware of any personal history of CAD or CHF. No known family history of cardiac issues. He does smoke approximately 0.5 ppd and consumes occasional alcohol.  Initial labs show WBC 5.6, Hgb 10.7 (close to baseline), platelets 183, Na+ 141, K+ 4.3 and creatinine 5.31 (at 5.14 in 03/2022).  D-dimer elevated to 1.51. Initial and repeat troponin values have been flat at 27, 27, 21 and 20. FLP shows total cholesterol 179, triglycerides 129, HDL 62 and LDL 91. CXR showing no acute cardiopulmonary abnormalities. VQ scan was performed given his  elevated D-dimer and showed no evidence of a PE. EKG shows sinus bradycardia, heart rate 56 with diffuse TWI along the inferior and lateral leads (similar to prior tracings from 06/2021).   Past Medical History:  Diagnosis Date   Anemia    Anxiety    Chronic back pain    Dementia    Nursing facility feels he has Dementia   Diabetes mellitus    Facial numbness    Fracture of right foot    Hypertension    Mental disorder    schizophrenia    Past Surgical History:  Procedure Laterality Date   AV FISTULA PLACEMENT Left 12/20/2021   Procedure: LEFT ARM ARTERIOVENOUS (AV) FISTULA CREATION;  Surgeon: Rosetta Posner, MD;  Location: [facility];  Service: Vascular;  Laterality: Left;   BIOPSY  08/02/2021   Procedure: BIOPSY;  Surgeon: Harvel Quale, MD;  Location: [facility];  Service: Gastroenterology;;  duodenal and gastric biopsies   COLONOSCOPY N/A 04/11/2018   Procedure: COLONOSCOPY;  Surgeon: Rogene Houston, MD;  Location: [facility];  Service: Endoscopy;  Laterality: N/A;  10:30   COLONOSCOPY WITH PROPOFOL N/A 07/05/2018   Procedure: COLONOSCOPY WITH PROPOFOL;  Surgeon: Rogene Houston, MD;  [facility]: [facility];  Service: Endoscopy;  Laterality: N/A;  10:35   COLONOSCOPY WITH PROPOFOL N/A 08/02/2021   Procedure: COLONOSCOPY WITH PROPOFOL;  Surgeon: Harvel Quale, MD;  [facility]: [facility];  Service: Gastroenterology;  Laterality: N/A;  9:05   ESOPHAGOGASTRODUODENOSCOPY (  EGD) WITH PROPOFOL N/A 08/02/2021   Procedure: ESOPHAGOGASTRODUODENOSCOPY (EGD) WITH PROPOFOL;  Surgeon: Harvel Quale, MD;  [facility]: [facility];  Service: Gastroenterology;  Laterality: N/A;   left 1st toe     ORIF TOE FRACTURE  09/05/2011   Procedure: OPEN REDUCTION INTERNAL FIXATION (ORIF) METATARSAL (TOE) FRACTURE;  Surgeon: Colin Rhein;  Location: [facility];  Service: Orthopedics;  Laterality: Left;  reconstruction left great toe FHB plantar plate    POLYPECTOMY  04/11/2018   Procedure: POLYPECTOMY;  Surgeon: Rogene Houston, MD;  [facility];  Service: Endoscopy;;  colon   POLYPECTOMY  07/05/2018   Procedure: POLYPECTOMY;  Surgeon: Rogene Houston, MD;  [facility];  Service: Endoscopy;;  colon   POLYPECTOMY  08/02/2021   Procedure: POLYPECTOMY;  Surgeon: Montez Morita, Quillian Quince, MD;  Location: [facility];  Service: Gastroenterology;;  transverse colon polyp x 1 ascending colon polyp   right foot suger       Home Medications:  Prior to Admission medications   Medication Sig Start Date End Date Taking? Authorizing Provider  acetaminophen (TYLENOL 8 HOUR) 650 MG CR tablet Take 1 tablet (650 mg total) by mouth every 8 (eight) hours as needed for pain or fever. 07/28/20  Yes Varney Biles, MD  aspirin EC 81 MG tablet Take 1 tablet (81 mg total) by mouth daily with breakfast. 07/15/18  Yes Rehman, Mechele Dawley, MD  carvedilol (COREG) 25 MG tablet Take 25 mg by mouth 2 (two) times daily. 11/29/21  Yes [provider]  Cholecalciferol 25 MCG (1000 UT) capsule Take 5,000 Units by mouth daily.   Yes [provider]  diclofenac Sodium (VOLTAREN) 1 % GEL Apply 2 g topically 4 (four) times daily.   Yes [provider]  ferrous sulfate 325 (65 FE) MG tablet Take 325 mg by mouth daily with breakfast.   Yes [provider]  gabapentin (NEURONTIN) 300 MG capsule TAKE (1) CAPSULE BY MOUTH THREE TIMES DAILY. Patient taking differently: Take 300 mg by mouth 3 (three) times daily. 04/28/22  Yes Lindell Spar, MD  NIFEdipine (PROCARDIA XL/NIFEDICAL XL) 60 MG 24 hr tablet Take by mouth. 03/16/22 03/16/23 Yes [provider]  OVER THE COUNTER MEDICATION D 3 5,000 IU once per day. In addition to five 1,000 IU daily.   Yes [provider]  calcitRIOL (ROCALTROL) 0.25 MCG capsule Take 0.25 mcg by mouth 3 (three) times a week. Patient not taking: Reported on 08/22/2022    [provider]  calcitRIOL (ROCALTROL) 0.25 MCG capsule Take by mouth. Patient not taking: Reported on 08/22/2022 07/26/22 07/26/23  [provider]  furosemide (LASIX) 40 MG tablet Take 20 mg by mouth 2 (two) times daily. Patient not taking: Reported on 04/13/2022    [provider]  losartan (COZAAR) 25 MG tablet Take 25 mg by mouth daily. Patient not taking: Reported on 04/13/2022    [provider]  NIFEdipine (ADALAT CC) 60 MG 24 hr tablet Take 60 mg by mouth daily. Patient not taking: Reported on 08/22/2022 03/16/22   [provider]  omeprazole (PRILOSEC) 40 MG capsule Take 1 capsule (40 mg total) by mouth 2 (two) times daily for 14 days. Patient not taking: Reported on 08/22/2022 08/04/21 08/18/21  Harvel Quale, MD    Inpatient Medications: Scheduled Meds:  aspirin EC  81 mg Oral Q breakfast   carvedilol  25 mg Oral BID   cholecalciferol  5,000 Units  Oral Daily   ferrous sulfate  325 mg Oral Q breakfast   heparin injection (subcutaneous)  5,000 Units Subcutaneous Q8H   pantoprazole  40 mg Oral BID   sodium chloride flush  3 mL Intravenous Q12H   Continuous Infusions:  sodium chloride     PRN Meds: sodium chloride, acetaminophen, ondansetron **OR** ondansetron (ZOFRAN) IV, sodium chloride flush  Allergies:   No Known Allergies  Social History:   Social History   Socioeconomic History   Marital status: Single    Spouse name: Not on file   Number of children: Not on file   Years of education: Not on file   Highest education level: Not on file  Occupational History   Not on file  Tobacco Use   Smoking status: Every Day    Packs/day: 0.25    Years: 40.00    Total pack years: 10.00    Types: Cigarettes   Smokeless tobacco: Never  Vaping Use   Vaping Use: Never used  Substance and Sexual Activity   Alcohol use: Yes    Comment: every other day   Drug use: No   Sexual activity: Not on file  Other Topics Concern   Not  on file  Social History Narrative   Not on file   Social Determinants of Health   Financial Resource Strain: Not on file  Food Insecurity: No Food Insecurity (08/23/2022)   Hunger Vital Sign    Worried About Running Out of Food in the Last Year: Never true    Ran Out of Food in the Last Year: Never true  Transportation Needs: No Transportation Needs (08/23/2022)   PRAPARE - Hydrologist (Medical): No    Lack of Transportation (Non-Medical): No  Physical Activity: Not on file  Stress: Not on file  Social Connections: Not on file  Intimate Partner Violence: Not At Risk (08/23/2022)   Humiliation, Afraid, Rape, and Kick questionnaire    Fear of Current or Ex-Partner: No    Emotionally Abused: No    Physically Abused: No    Sexually Abused: No    Family History:    Family History  Problem Relation Age of Onset   Diabetes Brother    Colon cancer Neg Hx    Liver disease Neg Hx        unknown for sure, mom died at age 64     ROS:  Please see the history of present illness.   All other ROS reviewed and negative.     Physical Exam/Data:   Vitals:   08/23/22 0229 08/23/22 0630 08/23/22 0736 08/23/22 0800  BP: (!) 173/87 (!) 155/89 (!) 161/80 (!) 172/89  Pulse: 62 66 64   Resp: 19 20 (!) 21 18  Temp: 97.9 F (36.6 C) 98 F (36.7 C) 97.8 F (36.6 C) 97.9 F (36.6 C)  TempSrc:   Oral Oral  SpO2: 100% 100% 99% 100%  Weight:      Height:       No intake or output data in the 24 hours ending 08/23/22 0900    08/23/2022    2:00 AM 08/22/2022   11:33 AM 04/19/2022    3:25 PM  Last 3 Weights  Weight (lbs) 196 lb 3.4 oz 230 lb 218 lb 9.6 oz  Weight (kg) 89 kg 104.327 kg 99.156 kg     Body mass index is 27.76 kg/m.  General: Pleasant male appearing in no acute distress HEENT: normal Neck: no JVD  Vascular: No carotid bruits; Distal pulses 2+ bilaterally Cardiac:  normal S1, S2; RRR; no murmur.  Lungs:  clear to auscultation bilaterally,  no wheezing, rhonchi or rales  Abd: soft, nontender, no hepatomegaly  Ext: no pitting edema Musculoskeletal:  No deformities, BUE and BLE strength normal and equal Skin: warm and dry  Neuro:  CNs 2-12 intact, no focal abnormalities noted Psych:  Normal affect   EKG:  The EKG was personally reviewed and demonstrates: Sinus bradycardia, heart rate 56 with diffuse TWI along the inferior and lateral leads (similar to prior tracings from 06/2021).  Telemetry:  Telemetry was personally reviewed and demonstrates: NSR, HR in 60's with occasional PVC's. No significant arrhythmias.   Relevant CV Studies:  Echocardiogram: Pending  Laboratory Data:  High Sensitivity Troponin:   Recent Labs  Lab 08/22/22 1212 08/22/22 1441 08/23/22 0322 08/23/22 0611  TROPONINIHS 27* 27* 21* 20*     Chemistry Recent Labs  Lab 08/22/22 1212 08/23/22 0322  NA 141 142  K 4.3 4.0  CL 115* 114*  CO2 21* 19*  GLUCOSE 133* 77  BUN 37* 35*  CREATININE 5.31* 4.75*  CALCIUM 9.1 9.1  GFRNONAA 11* 13*  ANIONGAP 5 9    Recent Labs  Lab 08/23/22 0322  PROT 6.3*  ALBUMIN 3.4*  AST 13*  ALT 12  ALKPHOS 58  BILITOT 0.3   Lipids  Recent Labs  Lab 08/23/22 0612  CHOL 179  TRIG 129  HDL 62  LDLCALC 91  CHOLHDL 2.9    Hematology Recent Labs  Lab 08/22/22 1212 08/23/22 0322  WBC 5.6 5.7  RBC 3.46* 3.37*  HGB 10.7* 10.3*  HCT 33.4* 32.3*  MCV 96.5 95.8  MCH 30.9 30.6  MCHC 32.0 31.9  RDW 14.3 14.2  PLT 183 190   Thyroid No results for input(s): "TSH", "FREET4" in the last 168 hours.  BNPNo results for input(s): "BNP", "PROBNP" in the last 168 hours.  DDimer  Recent Labs  Lab 08/22/22 1212  DDIMER 1.51*     Radiology/Studies:  NM Pulmonary Perfusion  Result Date: 08/22/2022 CLINICAL DATA:  66 year old male with suspected pulmonary embolism EXAM: NUCLEAR MEDICINE PERFUSION LUNG SCAN TECHNIQUE: Perfusion images were obtained in multiple projections after intravenous injection of  radiopharmaceutical. Ventilation scans intentionally deferred if perfusion scan and chest x-ray adequate for interpretation. RADIOPHARMACEUTICALS:  4.4 mCi Tc-62mMAA IV COMPARISON:  Chest x-ray 08/22/2022 FINDINGS: Scintigraphic planar imaging performed after administration of the radiopharmaceutical in standard anterior posterior and oblique positions. No large filling defects identified, with some attenuation of the uptake on the lateral views secondary to overlying soft tissues of the body wall. IMPRESSION: Perfusion nuclear medicine study negative for evidence of pulmonary emboli. Electronically Signed   By: JCorrie MckusickD.O.   On: 08/22/2022 15:42   DG Chest 2 View  Result Date: 08/22/2022 CLINICAL DATA:  Chest pain with dizziness for 2 days. EXAM: CHEST - 2 VIEW COMPARISON:  Radiographs 03/18/2021 and 12/03/2020.  CT 08/20/2011. FINDINGS: The heart size and mediastinal contours are stable. The lungs are clear. There is no pleural effusion or pneumothorax. Old injury of the distal right clavicle and adjacent coracoclavicular ligaments is unchanged. There are prominent osteophytes throughout the thoracic spine. No acute osseous findings are evident. Telemetry leads overlie the chest. IMPRESSION: Stable chest. No acute cardiopulmonary process. Chronic findings as above. Electronically Signed   By: WRichardean SaleM.D.   On: 08/22/2022 12:31     Assessment and Plan:   1. Atypical  Chest Pain - He developed chest discomfort after consuming beans and corn chips and pain resolved after vomiting. He denies any recurrent pain overnight or this morning and denies any recent exertional symptoms. - He has ruled out for ACS with troponin values being flat in the 20's and while his EKG does show diffuse TWI along the inferior and lateral leads, this is overall similar to prior tracings. D-dimer elevated but VQ Scan negative for a PE. He had been NPO for a possible stress test but given that he had a VQ scan  yesterday, this would need be delayed for at least 36 hours and I am not sure that a stress test would change his management strategy as he is not a candidate for a cardiac catheterization at this time given his Stage V CKD and not yet being on HD. He does wish to avoid HD for as long as possible. - Given his atypical symptoms, will plan to obtain an echocardiogram for assessment of EF and wall motion. If this is reassuring, would not anticipate further ischemic testing this admission.  2.  HTN - His BP has been elevated, at 172/89 on most recent check. He has been continued on PTA Coreg 25 mg twice daily. Was listed as being on Nifedipine as an outpatient but listed as not taking. Would anticipate adding this back if BP remains above goal.  3. Dizziness - He reports one isolated episode of dizziness which occurred after vomiting and denies any recurrent symptoms since. He has maintained normal sinus rhythm by review of telemetry. This overall sounds atypical for a cardiac arrhythmia and would not anticipate further monitoring at this time.  4. Stage 5 CKD - Creatinine at 5.31 on admission and at 4.75 today. Followed by Dr. Theador Hawthorne as an outpatient and he does have a fistula in place but has not yet been started on HD.     For questions or updates, please contact [facility] Please consult [email or web address] for contact info under    Signed, Erma Heritage, PA-C  08/23/2022 9:00 AM  Personally seen and examined. Agree with above.  66 year old male with schizophrenia and stage V chronic kidney disease currently with fistula placement but not currently on hemodialysis here with chest pain after eating beanie we knees at a meeting as well as corn chips.  He had epigastric discomfort then vomited and his symptoms went away.  Currently laying comfortably in bed.  No further chest discomfort.  T wave inversions were noted on ECG personally reviewed and interpreted.  Echocardiogram  shows LVH with normal overall ejection fraction.  On exam comfortable in bed heart regular rate and rhythm no murmurs rubs or gallops, lungs are clear.  No edema.  VQ scan was negative for PE.  Assessment and plan:  Chest discomfort/precordial chest pain/epigastric discomfort - Could certainly be gastrointestinal given his vomiting and resolution.  He does have risk factors for coronary disease with smoking history, age.  Chronic kidney disease as well. - Troponin mildly elevated.  Flat.  Looks like chronic myocardial injury in the setting of C Kd 5. -We will go ahead and set him up for an outpatient Lexiscan stress test to look for any high risk features.  Tobacco use - Counseled on tobacco cessation.  A pack lasts him about 3 days.  Chronic kidney disease stage V - She is followed by Dr. Theador Hawthorne.  Fistula in place ready to be utilized.  Has not yet started HD.  He has been  compliant with his follow-ups with him.  Hypertension - Carvedilol.  May not be a bad idea to restart his nifedipine.  Nephrology has been following.  Okay for discharge.  Candee Furbish, MD

## 2022-08-23 NOTE — Discharge Summary (Signed)
Physician Discharge Summary   Patient: Charles [name] MRN: [id number] DOB: [birth year]  Admit date:     08/22/2022  Discharge date: 08/23/22  Discharge Physician: Barton Dubois   PCP: Lindell Spar, MD   Recommendations at discharge:  Repeat basic metabolic panel to follow electrolytes and renal function. Continue assisting patient with tobacco cessation Reassess blood pressure and adjust antihypertensive regimen as needed.  Discharge Diagnoses: Principal Problem:   Chest pain Active Problems:   Gastroesophageal reflux disease   Chronic renal disease, stage 5, glomerular filtration rate (GFR) less than or equal to 15 mL/min/1.73 square meter (HCC) Tobacco abuse  Hospital Course: As per H&P written by Dr. Darrick Meigs on 08/22/2022  Charles [name]  is a 66 y.o. male, with history of CKD stage V, hypertension, diabetes mellitus type 2, schizophrenia who came to ED with complaints of chest pain, vomiting and dizziness.  Patient said that he was in class today when he started feeling dizzy and substernal chest pain.  Pain lasted about 15 minutes and also had vomiting.  Denies passing out. Denies shortness of breath, diaphoresis. Pain was substernal with no radiation. No previous history of CAD. In the ED patient was found to have elevated D-dimer of 1.51, nuclear perfusion scan was negative for PE. Troponin was 27, 27.  EKG unremarkable. ED provider spoke to cardiology Dr. Johney Frame, who recommended observation admission and then likely stress test in a.m.   Patient denies chest pain at this time Denies heartburn. No abdominal pain or dysuria  Assessment and Plan: 1-chest pain -Atypical presentation -Patient with a heart score of 4 -Troponin flat elevation most likely triggered by chronic renal failure -Chest pain-free -EKG without acute ischemic changes -2D echo reassuring and not demonstrating wall motion abnormalities. -Case discussed with cardiology service who is planning for  outpatient Myoview -Continue aspirin, Coreg and resume nifedipine. -Heart healthy diet and tobacco cessation discussed with patient.  2-elevated D-dimer -No tachypnea, no hypoxia, negative VQ scan -No chest pain at discharge.  3-GERD -Patient has been discharged on PPI twice a day -Lifestyle changes and tobacco cessation discussed with patient.  4-hypertension -Heart healthy diet discussed with patient -Discharged on carvedilol and nifedipine  5-stage V chronic kidney disease -Continue patient follow-up with nephrology service. -Do making urine -Advised to maintain adequate hydration.  6-idiopathic neuropathy -Resume adjusted dose of Neurontin based on renal function.  7-tobacco abuse -Cessation counseling provided.   Consultants: Cardiology service Procedures performed: 2-D echo: With preserved ejection fraction and no wall motion abnormalities. Disposition: home Diet recommendation: Heart healthy diet.  DISCHARGE MEDICATION: Allergies as of 08/23/2022   No Known Allergies      Medication List     STOP taking these medications    furosemide 40 MG tablet Commonly known as: LASIX   losartan 25 MG tablet Commonly known as: COZAAR       TAKE these medications    acetaminophen 650 MG CR tablet Commonly known as: Tylenol 8 Hour Take 1 tablet (650 mg total) by mouth every 8 (eight) hours as needed for pain or fever.   aspirin EC 81 MG tablet Take 1 tablet (81 mg total) by mouth daily with breakfast.   calcitRIOL 0.25 MCG capsule Commonly known as: ROCALTROL Take 1 capsule (0.25 mcg total) by mouth daily. What changed:  how much to take when to take this Another medication with the same name was removed. Continue taking this medication, and follow the directions you see here.   carvedilol 25 MG tablet  Commonly known as: COREG Take 25 mg by mouth 2 (two) times daily.   Cholecalciferol 25 MCG (1000 UT) capsule Take 5,000 Units by mouth daily.    diclofenac Sodium 1 % Gel Commonly known as: VOLTAREN Apply 2 g topically 4 (four) times daily.   ferrous sulfate 325 (65 FE) MG tablet Take 325 mg by mouth daily with breakfast.   gabapentin 300 MG capsule Commonly known as: NEURONTIN Take 1 capsule (300 mg total) by mouth daily. What changed: See the new instructions.   NIFEdipine 60 MG 24 hr tablet Commonly known as: PROCARDIA XL/NIFEDICAL XL Take 1 tablet (60 mg total) by mouth daily. What changed:  how much to take when to take this Another medication with the same name was removed. Continue taking this medication, and follow the directions you see here.   omeprazole 40 MG capsule Commonly known as: PRILOSEC Take 1 capsule (40 mg total) by mouth 2 (two) times daily.   OVER THE COUNTER MEDICATION D 3 5,000 IU once per day. In addition to five 1,000 IU daily.        Follow-up Information     Erma Heritage, PA-C Follow up.   Specialties: Physician Assistant, Cardiology Why: Cardiology Hospital Follow-up on 09/15/2022 at 2:30 PM. The office will contact you in the interim about arranging for a stress test. Contact information: [place] [address] [phone]         Lindell Spar, MD. Schedule an appointment as soon as possible for a visit in 10 day(s).   Specialty: Internal Medicine Contact information: [address] [place] [address] [phone]                Discharge Exam: Danley Danker Weights   08/22/22 1133 08/23/22 0200  Weight: 104.3 kg 89 kg   General exam: Alert, awake, oriented x 3; no chest pain, no shortness of breath. Respiratory system: Clear to auscultation. Respiratory effort normal.  Good saturation on room air. Cardiovascular system:RRR. No rubs or gallops. Gastrointestinal system: Abdomen is nondistended, soft and nontender. No organomegaly or masses felt. Normal bowel sounds heard. Central nervous system: Alert and oriented. No focal neurological  deficits. Extremities: No cyanosis or clubbing. Skin: No petechiae. Psychiatry: Judgement and insight appear normal. Mood & affect appropriate.    Condition at discharge: Stable and improved.  The results of significant diagnostics from this hospitalization (including imaging, microbiology, ancillary and laboratory) are listed below for reference.   Imaging Studies: NM Pulmonary Perfusion  Result Date: 08/22/2022 CLINICAL DATA:  66 year old male with suspected pulmonary embolism EXAM: NUCLEAR MEDICINE PERFUSION LUNG SCAN TECHNIQUE: Perfusion images were obtained in multiple projections after intravenous injection of radiopharmaceutical. Ventilation scans intentionally deferred if perfusion scan and chest x-ray adequate for interpretation. RADIOPHARMACEUTICALS:  4.4 mCi Tc-19mMAA IV COMPARISON:  Chest x-ray 08/22/2022 FINDINGS: Scintigraphic planar imaging performed after administration of the radiopharmaceutical in standard anterior posterior and oblique positions. No large filling defects identified, with some attenuation of the uptake on the lateral views secondary to overlying soft tissues of the body wall. IMPRESSION: Perfusion nuclear medicine study negative for evidence of pulmonary emboli. Electronically Signed   By: JCorrie MckusickD.O.   On: 08/22/2022 15:42   DG Chest 2 View  Result Date: 08/22/2022 CLINICAL DATA:  Chest pain with dizziness for 2 days. EXAM: CHEST - 2 VIEW COMPARISON:  Radiographs 03/18/2021 and 12/03/2020.  CT 08/20/2011. FINDINGS: The heart size and mediastinal contours are stable. The lungs are  clear. There is no pleural effusion or pneumothorax. Old injury of the distal right clavicle and adjacent coracoclavicular ligaments is unchanged. There are prominent osteophytes throughout the thoracic spine. No acute osseous findings are evident. Telemetry leads overlie the chest. IMPRESSION: Stable chest. No acute cardiopulmonary process. Chronic findings as above.  Electronically Signed   By: Richardean Sale M.D.   On: 08/22/2022 12:31    Microbiology: Results for orders placed or performed during the hospital encounter of 10/08/21  Resp Panel by RT-PCR (Flu A&B, Covid) Nasopharyngeal Swab     Status: Abnormal   Collection Time: 10/08/21  1:03 PM   Specimen: Nasopharyngeal Swab; Nasopharyngeal(NP) swabs in vial transport medium  Result Value Ref Range Status   SARS Coronavirus 2 by RT PCR POSITIVE (A) NEGATIVE Final    Comment: CRITICAL RESULT CALLED TO, READ BACK BY AND VERIFIED WITH:  J. DODD @ 1426 10/08/21 BY STEPHTR (NOTE) SARS-CoV-2 target nucleic acids are DETECTED.  The SARS-CoV-2 RNA is generally detectable in upper respiratory specimens during the acute phase of infection. Positive results are indicative of the presence of the identified virus, but do not rule out bacterial infection or co-infection with other pathogens not detected by the test. Clinical correlation with patient history and other diagnostic information is necessary to determine patient infection status. The expected result is Negative.  Fact Sheet for Patients: EntrepreneurPulse.com.au  Fact Sheet for Healthcare Providers: IncredibleEmployment.be  This test is not yet approved or cleared by the [place] FDA and  has been authorized for detection and/or diagnosis of SARS-CoV-2 by FDA under an Emergency Use Authorization (EUA).  This EUA will remain in effect (meaning this  test can be used) for the duration of  the COVID-19 declaration under Section 564(b)(1) of the Act, 21 U.S.C. section 360bbb-3(b)(1), unless the authorization is terminated or revoked sooner.     Influenza A by PCR NEGATIVE NEGATIVE Final   Influenza B by PCR NEGATIVE NEGATIVE Final    Comment: (NOTE) The Xpert Xpress SARS-CoV-2/FLU/RSV plus assay is intended as an aid in the diagnosis of influenza from Nasopharyngeal swab specimens and should not be  used as a sole basis for treatment. Nasal washings and aspirates are unacceptable for Xpert Xpress SARS-CoV-2/FLU/RSV testing.  Fact Sheet for Patients: EntrepreneurPulse.com.au  Fact Sheet for Healthcare Providers: IncredibleEmployment.be  This test is not yet approved or cleared by the [place] FDA and has been authorized for detection and/or diagnosis of SARS-CoV-2 by FDA under an Emergency Use Authorization (EUA). This EUA will remain in effect (meaning this test can be used) for the duration of the COVID-19 declaration under Section 564(b)(1) of the Act, 21 U.S.C. section 360bbb-3(b)(1), unless the authorization is terminated or revoked.  Performed at [facility], [address], [place], [place] [address]     Labs: CBC: Recent Labs  Lab 08/22/22 1212 08/23/22 0322  WBC 5.6 5.7  HGB 10.7* 10.3*  HCT 33.4* 32.3*  MCV 96.5 95.8  PLT 183 893   Basic Metabolic Panel: Recent Labs  Lab 08/22/22 1212 08/23/22 0322  NA 141 142  K 4.3 4.0  CL 115* 114*  CO2 21* 19*  GLUCOSE 133* 77  BUN 37* 35*  CREATININE 5.31* 4.75*  CALCIUM 9.1 9.1   Liver Function Tests: Recent Labs  Lab 08/23/22 0322  AST 13*  ALT 12  ALKPHOS 58  BILITOT 0.3  PROT 6.3*  ALBUMIN 3.4*   CBG: No results for input(s): "GLUCAP" in the last 168 hours.  Discharge  time spent: greater than 30 minutes.  Signed: Barton Dubois, MD [facility] 08/23/2022

## 2022-08-23 NOTE — Progress Notes (Signed)
Pt has denied chest pain or shortness of breath since arrival to unit. He remained in [facility]. No acute events overnight. Charles [name] Charles [name]

## 2022-08-23 NOTE — Progress Notes (Signed)
Pt discharged ambulatory per his request to POV. Pt escorted to security desk in ED to pick up personal belongings.

## 2022-08-23 NOTE — Telephone Encounter (Signed)
-----   Message from Erma Heritage, [place] sent at 08/23/2022  2:14 PM EDT ----- Regarding: RE: Outpatient Lexiscan Myoview Thank you! He will need the instruction sheet as well!  ----- Message ----- From: Tildon Husky Sent: 08/23/2022   2:11 PM EDT To: Marikay Alar Pinnix, LPN; # Subject: RE: Outpatient Lexiscan Myoview                It is scheduled just need the order entered  ----- Message ----- From: Erma Heritage, PA-C Sent: 08/23/2022   2:02 PM EDT To: Marikay Alar Pinnix, LPN; Berlinda Last, CMA; # Subject: Outpatient Lexiscan Myoview                    This patient needs an outpatient Lexiscan Myoview per Dr. Marlou Porch for chest pain. He will likely be discharged today so can call tomorrow. I did schedule a follow-up visit on 11/17 so if his stress test is not going to be until after that, please reschedule his follow-up visit.   Thanks,  [place]

## 2022-08-23 NOTE — Progress Notes (Signed)
Verified with security that they are in possession of pt's knives from last pm. They state pt can pick up items from security office on his way out. Assigned nurse notified of same.

## 2022-08-23 NOTE — Progress Notes (Signed)
  Transition of Care [facility]) Screening Note   Patient Details  Name: VIDAL LAMPKINS Date of Birth: 04/20/1956   Transition of Care [facility]) CM/SW Contact:    Iona Beard, [place] Phone Number: 08/23/2022, 11:18 AM    Transition of Care Department [facility]) has reviewed patient and no TOC needs have been identified at this time. We will continue to monitor patient advancement through interdisciplinary progression rounds. If new patient transition needs arise, please place a TOC consult.

## 2022-08-23 NOTE — Progress Notes (Signed)
*  PRELIMINARY RESULTS* Echocardiogram 2D Echocardiogram has been performed.  Charles [name] 08/23/2022, 12:21 PM

## 2022-08-24 ENCOUNTER — Telehealth: Payer: Self-pay

## 2022-08-25 ENCOUNTER — Telehealth: Payer: Self-pay | Admitting: *Deleted

## 2022-08-25 NOTE — Telephone Encounter (Signed)
Transition Care Management Unsuccessful Follow-up Telephone Call  Date of discharge and from where:  08/23/22 [facility]  Attempts:  2nd Attempt  Reason for unsuccessful TCM follow-up call:  Left voice message

## 2022-08-25 NOTE — Telephone Encounter (Signed)
2nd attempt

## 2022-08-30 ENCOUNTER — Encounter (HOSPITAL_COMMUNITY): Payer: Self-pay

## 2022-08-30 ENCOUNTER — Ambulatory Visit (HOSPITAL_COMMUNITY)
Admission: RE | Admit: 2022-08-30 | Discharge: 2022-08-30 | Disposition: A | Discharge status: Home / Self Care | Payer: Medicaid Other | Source: Ambulatory Visit | Attending: Cardiology | Admitting: Cardiology

## 2022-08-30 ENCOUNTER — Encounter (HOSPITAL_COMMUNITY)
Admission: RE | Admit: 2022-08-30 | Discharge: 2022-08-30 | Disposition: A | Discharge status: Home / Self Care | Payer: Medicaid Other | Source: Ambulatory Visit | Attending: Cardiology | Admitting: Cardiology

## 2022-08-30 DIAGNOSIS — R079 Chest pain, unspecified: Secondary | ICD-10-CM | POA: Insufficient documentation

## 2022-08-31 ENCOUNTER — Encounter (HOSPITAL_COMMUNITY): Payer: Self-pay

## 2022-08-31 ENCOUNTER — Ambulatory Visit (HOSPITAL_COMMUNITY)
Admission: RE | Admit: 2022-08-31 | Discharge: 2022-08-31 | Disposition: A | Discharge status: Home / Self Care | Payer: Medicaid Other | Source: Ambulatory Visit | Attending: Cardiology | Admitting: Cardiology

## 2022-08-31 DIAGNOSIS — R079 Chest pain, unspecified: Secondary | ICD-10-CM | POA: Diagnosis present

## 2022-08-31 HISTORY — DX: Disorder of kidney and ureter, unspecified: N28.9

## 2022-08-31 LAB — NM MYOCAR MULTI W/SPECT W/WALL MOTION / EF
LV dias vol: 161 mL (ref 62–150)
LV sys vol: 77 mL
Nuc Stress EF: 52 %
Peak HR: 98 {beats}/min
RATE: 0.3
Rest HR: 67 {beats}/min
Rest Nuclear Isotope Dose: 11 mCi
SDS: 1
SRS: 5
SSS: 6
ST Depression (mm): 0 mm
Stress Nuclear Isotope Dose: 33 mCi
TID: 0.99

## 2022-08-31 MED ORDER — REGADENOSON 0.4 MG/5ML IV SOLN
INTRAVENOUS | Status: AC
  Administered 2022-08-31: 0.4 mg
  Filled 2022-08-31: qty 5

## 2022-08-31 MED ORDER — TECHNETIUM TC 99M TETROFOSMIN IV KIT
30.0000 | PACK | Freq: Once | INTRAVENOUS | Status: AC | PRN
  Administered 2022-08-31: 33 via INTRAVENOUS

## 2022-08-31 MED ORDER — SODIUM CHLORIDE FLUSH 0.9 % IV SOLN
INTRAVENOUS | Status: AC
  Administered 2022-08-31: 10 mL
  Filled 2022-08-31: qty 10

## 2022-08-31 MED ORDER — TECHNETIUM TC 99M TETROFOSMIN IV KIT
10.0000 | PACK | Freq: Once | INTRAVENOUS | Status: AC | PRN
  Administered 2022-08-31: 11 via INTRAVENOUS

## 2022-09-15 ENCOUNTER — Ambulatory Visit: Payer: Medicaid Other | Attending: Student | Admitting: Student

## 2022-09-15 ENCOUNTER — Encounter: Payer: Self-pay | Admitting: Student

## 2022-09-15 VITALS — BP 118/62 | HR 74 | Ht 70.0 in | Wt 200.0 lb

## 2022-09-15 DIAGNOSIS — R079 Chest pain, unspecified: Secondary | ICD-10-CM

## 2022-09-15 DIAGNOSIS — N185 Chronic kidney disease, stage 5: Secondary | ICD-10-CM

## 2022-09-15 DIAGNOSIS — I10 Essential (primary) hypertension: Secondary | ICD-10-CM | POA: Diagnosis present

## 2022-09-15 DIAGNOSIS — I120 Hypertensive chronic kidney disease with stage 5 chronic kidney disease or end stage renal disease: Secondary | ICD-10-CM

## 2022-09-15 NOTE — Patient Instructions (Signed)
Medication Instructions:   Continue current medication regimen.   *If you need a refill on your cardiac medications before your next appointment, please call your pharmacy*    Follow-Up: At [facility], you and your health needs are our priority.  As part of our continuing mission to provide you with exceptional heart care, we have created designated Provider Care Teams.  These Care Teams include your primary Cardiologist (physician) and Advanced Practice Providers (APPs -  Physician Assistants and Nurse Practitioners) who all work together to provide you with the care you need, when you need it.  We recommend signing up for the patient portal called "MyChart".  Sign up information is provided on this After Visit Summary.  MyChart is used to connect with patients for Virtual Visits (Telemedicine).  Patients are able to view lab/test results, encounter notes, upcoming appointments, etc.  Non-urgent messages can be sent to your provider as well.   To learn more about what you can do with MyChart, go to [email or web address].    Your next appointment:   1 year(s)  The format for your next appointment:   In Person  Provider:   You may see Candee Furbish, MD or one of the following Advanced Practice Providers on your designated Care Team:   [place], PA-C  Ermalinda Barrios, [place]     Important Information About Sugar

## 2022-09-15 NOTE — Progress Notes (Unsigned)
Cardiology Office Note    Date:  09/16/2022   ID:  Lendell, Gallick [birth year], MRN [id number]  PCP:  Lindell Spar, MD  Cardiologist: Candee Furbish, MD    Chief Complaint  Patient presents with   Hospitalization Follow-up    History of Present Illness:    MITCHEAL SWEETIN is a 66 y.o. male with past medical history of HTN, Schizophrenia and Stage 5 CKD (s/p fistula placement but not currently on HD) who presents to the office today for hospital follow-up.  He was most recently admitted to [facility] in 07/2022 for evaluation of chest discomfort after consuming food. Troponin values were flat, peaking at 27 and EKG did show diffuse TWI along the inferior and lateral leads which was overall similar to prior tracings. Echocardiogram showed a preserved EF of 60 to 65% with no wall motion abnormalities and no significant valve abnormalities. Cardiology was consulted for further management but given that he had undergone a VQ scan at the time of admission, it was recommended to arrange for stress testing as an outpatient to assess for high-risk features as he would not be a cardiac catheterization candidate given his advanced CKD. His stress test was performed on 08/31/2022 and he did have a small, mild intensity, apical to basal inferior defect which was more prominent at rest than stress suggesting soft tissue attenuation but scar could not be completely excluded. There was no evidence of ischemia and the study was overall low-risk.   In talking with the patient today, he reports overall doing well since his recent hospitalization. He reports only 1 episode of chest pain which occurred after food consumption. No exertional chest pain. Reports his breathing has been stable with no recent orthopnea, PND or pitting edema.  He does not exercise routinely but reports being able to walk around the grocery store without any anginal symptoms. He does not check his blood pressure at home but it is  well-controlled at 118/62 during today's visit.   Past Medical History:  Diagnosis Date   Anemia    Anxiety    Chronic back pain    Dementia    Nursing facility feels he has Dementia   Diabetes mellitus    Facial numbness    Fracture of right foot    Hypertension    Mental disorder    schizophrenia   Renal insufficiency     Past Surgical History:  Procedure Laterality Date   AV FISTULA PLACEMENT Left 12/20/2021   Procedure: LEFT ARM ARTERIOVENOUS (AV) FISTULA CREATION;  Surgeon: Rosetta Posner, MD;  [facility];  Service: Vascular;  Laterality: Left;   BIOPSY  08/02/2021   Procedure: BIOPSY;  Surgeon: Harvel Quale, MD;  [facility];  Service: Gastroenterology;;  duodenal and gastric biopsies   COLONOSCOPY N/A 04/11/2018   Procedure: COLONOSCOPY;  Surgeon: Rogene Houston, MD;  [facility];  Service: Endoscopy;  Laterality: N/A;  10:30   COLONOSCOPY WITH PROPOFOL N/A 07/05/2018   Procedure: COLONOSCOPY WITH PROPOFOL;  Surgeon: Rogene Houston, MD;  [facility];  Service: Endoscopy;  Laterality: N/A;  10:35   COLONOSCOPY WITH PROPOFOL N/A 08/02/2021   Procedure: COLONOSCOPY WITH PROPOFOL;  Surgeon: Harvel Quale, MD;  [facility];  Service: Gastroenterology;  Laterality: N/A;  9:05   ESOPHAGOGASTRODUODENOSCOPY (EGD) WITH PROPOFOL N/A 08/02/2021   Procedure: ESOPHAGOGASTRODUODENOSCOPY (EGD) WITH PROPOFOL;  Surgeon: Harvel Quale, MD;  [facility];  Service: Gastroenterology;  Laterality: N/A;   left 1st toe     ORIF TOE FRACTURE  09/05/2011   Procedure: OPEN REDUCTION INTERNAL FIXATION (ORIF) METATARSAL (TOE) FRACTURE;  Surgeon: Colin Rhein;  Location: [place];  Service: Orthopedics;  Laterality: Left;  reconstruction left great toe FHB plantar plate   POLYPECTOMY  04/11/2018   Procedure: POLYPECTOMY;  Surgeon: Rogene Houston, MD;  [facility];  Service:  Endoscopy;;  colon   POLYPECTOMY  07/05/2018   Procedure: POLYPECTOMY;  Surgeon: Rogene Houston, MD;  [facility];  Service: Endoscopy;;  colon   POLYPECTOMY  08/02/2021   Procedure: POLYPECTOMY;  Surgeon: Harvel Quale, MD;  [facility];  Service: Gastroenterology;;  transverse colon polyp x 1 ascending colon polyp   right foot suger      Current Medications: Outpatient Medications Prior to Visit  Medication Sig Dispense Refill   acetaminophen (TYLENOL 8 HOUR) 650 MG CR tablet Take 1 tablet (650 mg total) by mouth every 8 (eight) hours as needed for pain or fever. 30 tablet 0   aspirin EC 81 MG tablet Take 1 tablet (81 mg total) by mouth daily with breakfast.     calcitRIOL (ROCALTROL) 0.25 MCG capsule Take 1 capsule (0.25 mcg total) by mouth daily. 30 capsule 11   carvedilol (COREG) 25 MG tablet Take 25 mg by mouth 2 (two) times daily.     Cholecalciferol 25 MCG (1000 UT) capsule Take 5,000 Units by mouth daily.     diclofenac Sodium (VOLTAREN) 1 % GEL Apply 2 g topically 4 (four) times daily.     ferrous sulfate 325 (65 FE) MG tablet Take 325 mg by mouth daily with breakfast.     furosemide (LASIX) 20 MG tablet Take 40 mg by mouth daily.     gabapentin (NEURONTIN) 300 MG capsule Take 1 capsule (300 mg total) by mouth daily. 30 capsule 2   NIFEdipine (PROCARDIA XL/NIFEDICAL XL) 60 MG 24 hr tablet Take 1 tablet (60 mg total) by mouth daily. 30 tablet 3   omeprazole (PRILOSEC) 40 MG capsule Take 1 capsule (40 mg total) by mouth 2 (two) times daily. 60 capsule 1   OVER THE COUNTER MEDICATION D 3 5,000 IU once per day. In addition to five 1,000 IU daily.     sodium bicarbonate 650 MG tablet Take 650 mg by mouth 3 (three) times daily.     tamsulosin (FLOMAX) 0.4 MG CAPS capsule Take 0.4 mg by mouth daily.     No facility-administered medications prior to visit.     Allergies:   Patient has no known allergies.   Social History   Socioeconomic History    Marital status: Single    Spouse name: Not on file   Number of children: Not on file   Years of education: Not on file   Highest education level: Not on file  Occupational History   Not on file  Tobacco Use   Smoking status: Every Day    Packs/day: 0.25    Years: 40.00    Total pack years: 10.00    Types: Cigarettes   Smokeless tobacco: Never  Vaping Use   Vaping Use: Never used  Substance and Sexual Activity   Alcohol use: Yes    Comment: every other day   Drug use: No   Sexual activity: Not on file  Other Topics Concern   Not on file  Social History Narrative   Not  on file   Social Determinants of Health   Financial Resource Strain: Not on file  Food Insecurity: No Food Insecurity (08/23/2022)   Hunger Vital Sign    Worried About Running Out of Food in the Last Year: Never true    Ran Out of Food in the Last Year: Never true  Transportation Needs: No Transportation Needs (08/23/2022)   PRAPARE - Hydrologist (Medical): No    Lack of Transportation (Non-Medical): No  Physical Activity: Not on file  Stress: Not on file  Social Connections: Not on file     Family History:  The patient's family history includes Diabetes in his brother.   Review of Systems:    Please see the history of present illness.     All other systems reviewed and are otherwise negative except as noted above.   Physical Exam:    VS:  BP 118/62   Pulse 74   Ht '5\' 10"'$  (1.778 m)   Wt 200 lb (90.7 kg)   SpO2 98%   BMI 28.70 kg/m    General: Well developed, well nourished,male appearing in no acute distress. Head: Normocephalic, atraumatic. Neck: No carotid bruits. JVD not elevated.  Lungs: Respirations regular and unlabored, without wheezes or rales.  Heart: Regular rate and rhythm. No S3 or S4.  No murmur, no rubs, or gallops appreciated. Abdomen: Appears non-distended. No obvious abdominal masses. Msk:  Strength and tone appear normal for age. No obvious  joint deformities or effusions. Extremities: No clubbing or cyanosis. No pitting edema.  Distal pedal pulses are 2+ bilaterally. Neuro: Alert and oriented X 3. Moves all extremities spontaneously. No focal deficits noted. Psych:  Responds to questions appropriately with a normal affect. Skin: No rashes or lesions noted  Wt Readings from Last 3 Encounters:  09/15/22 200 lb (90.7 kg)  08/23/22 196 lb 3.4 oz (89 kg)  04/19/22 218 lb 9.6 oz (99.2 kg)     Studies/Labs Reviewed:   EKG:  EKG is not ordered today.    Recent Labs: 08/23/2022: ALT 12; BUN 35; Creatinine, Ser 4.75; Hemoglobin 10.3; Platelets 190; Potassium 4.0; Sodium 142   Lipid Panel    Component Value Date/Time   CHOL 179 08/23/2022 0612   TRIG 129 08/23/2022 0612   HDL 62 08/23/2022 0612   CHOLHDL 2.9 08/23/2022 0612   VLDL 26 08/23/2022 0612   LDLCALC 91 08/23/2022 0612    Additional studies/ records that were reviewed today include:   Echocardiogram: 07/2022 IMPRESSIONS     1. Left ventricular ejection fraction, by estimation, is 60 to 65%. The  left ventricle has normal function. The left ventricle has no regional  wall motion abnormalities. There is moderate left ventricular hypertrophy.  Left ventricular diastolic  parameters are consistent with Grade I diastolic dysfunction (impaired  relaxation).   2. Right ventricular systolic function is normal. The right ventricular  size is mildly enlarged.   3. Left atrial size was moderately dilated.   4. Right atrial size was moderately dilated.   5. The mitral valve is normal in structure. No evidence of mitral valve  regurgitation. No evidence of mitral stenosis.   6. The aortic valve is normal in structure. Aortic valve regurgitation is  not visualized. No aortic stenosis is present.   7. The inferior vena cava is normal in size with greater than 50%  respiratory variability, suggesting right atrial pressure of 3 mmHg.   NST: 08/2022   Findings are  equivocal.  The study is low risk.   No ST deviation was noted. Limited and brief episodes of second-degree type I block following Lexiscan.   LV perfusion is equivocal.  Small, mild intensity, apical to basal inferior defect that is more prominent at rest than stress suggesting soft tissue/diaphragmatic attenuation.  Cannot completely exclude scar, but no large ischemic territories.   Left ventricular function is low normal. Nuclear stress EF: 52 %.   Low risk study with equivocal inferior perfusion defect as outlined and LVEF 52%.  Assessment:    1. Chest pain of uncertain etiology   2. Essential hypertension   3. 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 Atypical Chest Pain - Recent NST showed a small, mild intensity, apical to basal inferior defect which was more prominent at rest than stress suggesting soft tissue attenuation and no evidence of ischemia, overall being a low-risk study.  - He denies any recent exertional chest pain and his initial episode of pain at the time of admission but atypical for a cardiac etiology as it occurred after food consumption. Remains on Omeprazole. No indication for further cardiac testing at this time. Continue with risk factor modification.   2. HTN - His BP is well-controlled at 118/62 during today's visit. Continue current medical therapy with Coreg 25 mg twice daily and Nifedipine 60 mg daily.  3. Stage 5 CKD - Followed by Dr. Theador Hawthorne. Creatinine was at 5.60 on 08/29/2022. He does have a fistula in place but has not yet required HD.    Will plan for follow-up in 1 year and I encouraged him to make [place] aware of any symptoms which would prompt evaluation in the interim.    Medication Adjustments/Labs and Tests Ordered: Current medicines are reviewed at length with the patient today.  Concerns regarding medicines are outlined above.  Medication changes, Labs  and Tests ordered today are listed in the Patient Instructions below. Patient Instructions  Medication Instructions:   Continue current medication regimen.   *If you need a refill on your cardiac medications before your next appointment, please call your pharmacy*    Follow-Up: At [facility], you and your health needs are our priority.  As part of our continuing mission to provide you with exceptional heart care, we have created designated Provider Care Teams.  These Care Teams include your primary Cardiologist (physician) and Advanced Practice Providers (APPs -  Physician Assistants and Nurse Practitioners) who all work together to provide you with the care you need, when you need it.  We recommend signing up for the patient portal called "MyChart".  Sign up information is provided on this After Visit Summary.  MyChart is used to connect with patients for Virtual Visits (Telemedicine).  Patients are able to view lab/test results, encounter notes, upcoming appointments, etc.  Non-urgent messages can be sent to your provider as well.   To learn more about what you can do with MyChart, go to [email or web address].    Your next appointment:   1 year(s)  The format for your next appointment:   In Person  Provider:   You may see Candee Furbish, MD or one of the following Advanced Practice Providers on your designated Care Team:   [place], PA-C  Ermalinda Barrios, PA-C     Important Information About Sugar         Signed, Erma Heritage, PA-C  09/16/2022 4:32 PM    [facility] [address] [place], [place] [address] Phone: [phone] Fax: [phone]

## 2022-09-16 ENCOUNTER — Encounter: Payer: Self-pay | Admitting: Student

## 2022-09-20 ENCOUNTER — Telehealth: Payer: Self-pay | Admitting: Internal Medicine

## 2022-09-20 NOTE — Telephone Encounter (Signed)
FL2 form dropped off by [facility] [employer]   Noted/copied/sleeved

## 2022-09-27 ENCOUNTER — Ambulatory Visit: Payer: Medicaid Other | Admitting: Internal Medicine

## 2022-09-27 ENCOUNTER — Encounter: Payer: Self-pay | Admitting: Family Medicine

## 2022-09-27 ENCOUNTER — Ambulatory Visit (INDEPENDENT_AMBULATORY_CARE_PROVIDER_SITE_OTHER): Payer: Medicaid Other | Admitting: Family Medicine

## 2022-09-27 VITALS — BP 142/88 | HR 79 | Ht 70.0 in | Wt 201.2 lb

## 2022-09-27 DIAGNOSIS — I10 Essential (primary) hypertension: Secondary | ICD-10-CM

## 2022-09-27 NOTE — Patient Instructions (Signed)
I appreciate the opportunity to provide care to you today!    Follow up:  with PCP in 1 week for BP  I will contact your PCP  to complete this form You will be informed if and when the form is completed  Please continue to a heart-healthy diet and increase your physical activities. Try to exercise for 9mns at least three times a week.      It was a pleasure to see you and I look forward to continuing to work together on your health and well-being. Please do not hesitate to call the office if you need care or have questions about your care.   Have a wonderful day and week. With Gratitude, GAlvira MondayMSN, FNP-BC

## 2022-09-27 NOTE — Progress Notes (Deleted)
Established Patient Office Visit  Subjective:  Patient ID: Charles [name], male    DOB: May 11, 1956  Age: 67 y.o. MRN: [id number]  CC:  Chief Complaint  Patient presents with   Follow-up    Pt has FL2 FORM for home health aide.     HPI Charles [name] is a 66 y.o. male with past medical history of essential hypertension, GERD, idiopathic peripheral neuropathy presents for request to complete his home health FL2 to form. The patient reports having home health assistance to his house 2-3 times weekly to help with ADLs due to his disability.   Past Medical History:  Diagnosis Date   Anemia    Anxiety    Chronic back pain    Dementia    Nursing facility feels he has Dementia   Diabetes mellitus    Facial numbness    Fracture of right foot    Hypertension    Mental disorder    schizophrenia   Renal insufficiency     Past Surgical History:  Procedure Laterality Date   AV FISTULA PLACEMENT Left 12/20/2021   Procedure: LEFT ARM ARTERIOVENOUS (AV) FISTULA CREATION;  Surgeon: Rosetta Posner, MD;  [facility];  Service: Vascular;  Laterality: Left;   BIOPSY  08/02/2021   Procedure: BIOPSY;  Surgeon: Harvel Quale, MD;  [facility];  Service: Gastroenterology;;  duodenal and gastric biopsies   COLONOSCOPY N/A 04/11/2018   Procedure: COLONOSCOPY;  Surgeon: Rogene Houston, MD;  [facility];  Service: Endoscopy;  Laterality: N/A;  10:30   COLONOSCOPY WITH PROPOFOL N/A 07/05/2018   Procedure: COLONOSCOPY WITH PROPOFOL;  Surgeon: Rogene Houston, MD;  [facility];  Service: Endoscopy;  Laterality: N/A;  10:35   COLONOSCOPY WITH PROPOFOL N/A 08/02/2021   Procedure: COLONOSCOPY WITH PROPOFOL;  Surgeon: Harvel Quale, MD;  [facility];  Service: Gastroenterology;  Laterality: N/A;  9:05   ESOPHAGOGASTRODUODENOSCOPY (EGD) WITH PROPOFOL N/A 08/02/2021   Procedure: ESOPHAGOGASTRODUODENOSCOPY (EGD) WITH PROPOFOL;  Surgeon:  Harvel Quale, MD;  [facility];  Service: Gastroenterology;  Laterality: N/A;   left 1st toe     ORIF TOE FRACTURE  09/05/2011   Procedure: OPEN REDUCTION INTERNAL FIXATION (ORIF) METATARSAL (TOE) FRACTURE;  Surgeon: Colin Rhein;  Location: [place];  Service: Orthopedics;  Laterality: Left;  reconstruction left great toe FHB plantar plate   POLYPECTOMY  04/11/2018   Procedure: POLYPECTOMY;  Surgeon: Rogene Houston, MD;  [facility];  Service: Endoscopy;;  colon   POLYPECTOMY  07/05/2018   Procedure: POLYPECTOMY;  Surgeon: Rogene Houston, MD;  [facility];  Service: Endoscopy;;  colon   POLYPECTOMY  08/02/2021   Procedure: POLYPECTOMY;  Surgeon: Montez Morita, Quillian Quince, MD;  [facility];  Service: Gastroenterology;;  transverse colon polyp x 1 ascending colon polyp   right foot suger      Family History  Problem Relation Age of Onset   Diabetes Brother    Colon cancer Neg Hx    Liver disease Neg Hx        unknown for sure, mom died at age 40    Social History   Socioeconomic History   Marital status: Single    Spouse name: Not on file   Number of children: Not on file   Years of education: Not on file   Highest education level: Not on file  Occupational History  Not on file  Tobacco Use   Smoking status: Every Day    Packs/day: 0.25    Years: 40.00    Total pack years: 10.00    Types: Cigarettes   Smokeless tobacco: Never   Tobacco comments:    Smoke   Vaping Use   Vaping Use: Never used  Substance and Sexual Activity   Alcohol use: Yes    Comment: every other day   Drug use: No   Sexual activity: Not on file  Other Topics Concern   Not on file  Social History Narrative   Not on file   Social Determinants of Health   Financial Resource Strain: Not on file  Food Insecurity: No Food Insecurity (08/23/2022)   Hunger Vital Sign    Worried About Running Out of Food in the Last Year:  Never true    Ran Out of Food in the Last Year: Never true  Transportation Needs: No Transportation Needs (08/23/2022)   PRAPARE - Hydrologist (Medical): No    Lack of Transportation (Non-Medical): No  Physical Activity: Not on file  Stress: Not on file  Social Connections: Not on file  Intimate Partner Violence: Not At Risk (08/23/2022)   Humiliation, Afraid, Rape, and Kick questionnaire    Fear of Current or Ex-Partner: No    Emotionally Abused: No    Physically Abused: No    Sexually Abused: No    Outpatient Medications Prior to Visit  Medication Sig Dispense Refill   acetaminophen (TYLENOL 8 HOUR) 650 MG CR tablet Take 1 tablet (650 mg total) by mouth every 8 (eight) hours as needed for pain or fever. 30 tablet 0   aspirin EC 81 MG tablet Take 1 tablet (81 mg total) by mouth daily with breakfast.     calcitRIOL (ROCALTROL) 0.25 MCG capsule Take 1 capsule (0.25 mcg total) by mouth daily. 30 capsule 11   carvedilol (COREG) 25 MG tablet Take 25 mg by mouth 2 (two) times daily.     Cholecalciferol 25 MCG (1000 UT) capsule Take 5,000 Units by mouth daily.     diclofenac Sodium (VOLTAREN) 1 % GEL Apply 2 g topically 4 (four) times daily.     ferrous sulfate 325 (65 FE) MG tablet Take 325 mg by mouth daily with breakfast.     furosemide (LASIX) 20 MG tablet Take 40 mg by mouth daily.     gabapentin (NEURONTIN) 300 MG capsule Take 1 capsule (300 mg total) by mouth daily. 30 capsule 2   NIFEdipine (PROCARDIA XL/NIFEDICAL XL) 60 MG 24 hr tablet Take 1 tablet (60 mg total) by mouth daily. 30 tablet 3   omeprazole (PRILOSEC) 40 MG capsule Take 1 capsule (40 mg total) by mouth 2 (two) times daily. 60 capsule 1   OVER THE COUNTER MEDICATION D 3 5,000 IU once per day. In addition to five 1,000 IU daily.     sodium bicarbonate 650 MG tablet Take 650 mg by mouth 3 (three) times daily.     tamsulosin (FLOMAX) 0.4 MG CAPS capsule Take 0.4 mg by mouth daily.     No  facility-administered medications prior to visit.    No Known Allergies  ROS Review of Systems  Constitutional:  Negative for fatigue and fever.  Eyes:  Negative for visual disturbance.  Respiratory:  Negative for chest tightness and shortness of breath.       Objective:    Physical Exam HENT:     Head:  Normocephalic.  Cardiovascular:     Rate and Rhythm: Normal rate and regular rhythm.     Pulses: Normal pulses.     Heart sounds: Normal heart sounds.  Pulmonary:     Effort: Pulmonary effort is normal.  Neurological:     Mental Status: He is alert.     BP (!) 142/88 (BP Location: Left Arm)   Pulse 79   Ht '5\' 10"'$  (1.778 m)   Wt 201 lb 3.2 oz (91.3 kg)   SpO2 98%   BMI 28.87 kg/m  Wt Readings from Last 3 Encounters:  09/27/22 201 lb 3.2 oz (91.3 kg)  09/15/22 200 lb (90.7 kg)  08/23/22 196 lb 3.4 oz (89 kg)    Lab Results  Component Value Date   TSH 1.089 ***Test methodology is 3rd generation TSH*** 06/27/2007   Lab Results  Component Value Date   WBC 5.7 08/23/2022   HGB 10.3 (L) 08/23/2022   HCT 32.3 (L) 08/23/2022   MCV 95.8 08/23/2022   PLT 190 08/23/2022   Lab Results  Component Value Date   NA 142 08/23/2022   K 4.0 08/23/2022   CO2 19 (L) 08/23/2022   GLUCOSE 77 08/23/2022   BUN 35 (H) 08/23/2022   CREATININE 4.75 (H) 08/23/2022   BILITOT 0.3 08/23/2022   ALKPHOS 58 08/23/2022   AST 13 (L) 08/23/2022   ALT 12 08/23/2022   PROT 6.3 (L) 08/23/2022   ALBUMIN 3.4 (L) 08/23/2022   CALCIUM 9.1 08/23/2022   ANIONGAP 9 08/23/2022   Lab Results  Component Value Date   CHOL 179 08/23/2022   Lab Results  Component Value Date   HDL 62 08/23/2022   Lab Results  Component Value Date   LDLCALC 91 08/23/2022   Lab Results  Component Value Date   TRIG 129 08/23/2022   Lab Results  Component Value Date   CHOLHDL 2.9 08/23/2022   Lab Results  Component Value Date   HGBA1C 5.3 04/13/2022      Assessment & Plan:  Essential  hypertension Assessment & Plan: Uncontrolled Encouraged to follow-up with his PCP in 1 week for BP management Will defer completion of FL 2 form to his PCP, who is more familiar with the baseline of the patient's health and need for ongoing home health assistance as this is my first encounter with the patient     Follow-up: Return in about 1 week (around 10/04/2022) for BP.   Alvira Monday, FNP

## 2022-09-27 NOTE — Assessment & Plan Note (Signed)
Uncontrolled Encouraged to follow-up with his PCP in 1 week for BP management Will defer completion of FL 2 form to his PCP, who is more familiar with the baseline of the patient's health and need for ongoing home health assistance as this is my first encounter with the patient

## 2022-09-27 NOTE — Progress Notes (Signed)
Established Patient Office Visit  Subjective:  Patient ID: Charles [name], male    DOB: [birth year]  Age: 66 y.o. MRN: [id number]  CC:  Chief Complaint  Patient presents with   Follow-up    Pt has FL2 FORM for home health aide.     HPI Charles [name] is a 66 y.o. male with past medical history of essential hypertension, GERD, idiopathic peripheral neuropathy presents for request to complete his home health FL2 to form. The patient reports having home health assistance to his house 2-3 times weekly to help with ADLs due to his disability.  Past Medical History:  Diagnosis Date   Anemia    Anxiety    Chronic back pain    Dementia    Nursing facility feels he has Dementia   Diabetes mellitus    Facial numbness    Fracture of right foot    Hypertension    Mental disorder    schizophrenia   Renal insufficiency     Past Surgical History:  Procedure Laterality Date   AV FISTULA PLACEMENT Left 12/20/2021   Procedure: LEFT ARM ARTERIOVENOUS (AV) FISTULA CREATION;  Surgeon: Rosetta Posner, MD;  Location: [facility];  Service: Vascular;  Laterality: Left;   BIOPSY  08/02/2021   Procedure: BIOPSY;  Surgeon: Harvel Quale, MD;  [facility];  Service: Gastroenterology;;  duodenal and gastric biopsies   COLONOSCOPY N/A 04/11/2018   Procedure: COLONOSCOPY;  Surgeon: Rogene Houston, MD;  [facility]: [facility];  Service: Endoscopy;  Laterality: N/A;  10:30   COLONOSCOPY WITH PROPOFOL N/A 07/05/2018   Procedure: COLONOSCOPY WITH PROPOFOL;  Surgeon: Rogene Houston, MD;  [facility]: [facility];  Service: Endoscopy;  Laterality: N/A;  10:35   COLONOSCOPY WITH PROPOFOL N/A 08/02/2021   Procedure: COLONOSCOPY WITH PROPOFOL;  Surgeon: Harvel Quale, MD;  [facility];  Service: Gastroenterology;  Laterality: N/A;  9:05   ESOPHAGOGASTRODUODENOSCOPY (EGD) WITH PROPOFOL N/A 08/02/2021   Procedure: ESOPHAGOGASTRODUODENOSCOPY (EGD) WITH PROPOFOL;  Surgeon:  Harvel Quale, MD;  [facility]: [facility];  Service: Gastroenterology;  Laterality: N/A;   left 1st toe     ORIF TOE FRACTURE  09/05/2011   Procedure: OPEN REDUCTION INTERNAL FIXATION (ORIF) METATARSAL (TOE) FRACTURE;  Surgeon: Colin Rhein;  Location: [facility];  Service: Orthopedics;  Laterality: Left;  reconstruction left great toe FHB plantar plate   POLYPECTOMY  04/11/2018   Procedure: POLYPECTOMY;  Surgeon: Rogene Houston, MD;  [facility];  Service: Endoscopy;;  colon   POLYPECTOMY  07/05/2018   Procedure: POLYPECTOMY;  Surgeon: Rogene Houston, MD;  [facility];  Service: Endoscopy;;  colon   POLYPECTOMY  08/02/2021   Procedure: POLYPECTOMY;  Surgeon: Montez Morita, Quillian Quince, MD;  Location: [facility];  Service: Gastroenterology;;  transverse colon polyp x 1 ascending colon polyp   right foot suger      Family History  Problem Relation Age of Onset   Diabetes Brother    Colon cancer Neg Hx    Liver disease Neg Hx        unknown for sure, mom died at age 72    Social History   Socioeconomic History   Marital status: Single    Spouse name: Not on file   Number of children: Not on file   Years of education: Not on file   Highest education level: Not on file  Occupational History  Not on file  Tobacco Use   Smoking status: Every Day    Packs/day: 0.25    Years: 40.00    Total pack years: 10.00    Types: Cigarettes   Smokeless tobacco: Never   Tobacco comments:    Smoke   Vaping Use   Vaping Use: Never used  Substance and Sexual Activity   Alcohol use: Yes    Comment: every other day   Drug use: No   Sexual activity: Not on file  Other Topics Concern   Not on file  Social History Narrative   Not on file   Social Determinants of Health   Financial Resource Strain: Not on file  Food Insecurity: No Food Insecurity (08/23/2022)   Hunger Vital Sign    Worried About Running Out of Food in the Last Year:  Never true    Ran Out of Food in the Last Year: Never true  Transportation Needs: No Transportation Needs (08/23/2022)   PRAPARE - Hydrologist (Medical): No    Lack of Transportation (Non-Medical): No  Physical Activity: Not on file  Stress: Not on file  Social Connections: Not on file  Intimate Partner Violence: Not At Risk (08/23/2022)   Humiliation, Afraid, Rape, and Kick questionnaire    Fear of Current or Ex-Partner: No    Emotionally Abused: No    Physically Abused: No    Sexually Abused: No    Outpatient Medications Prior to Visit  Medication Sig Dispense Refill   acetaminophen (TYLENOL 8 HOUR) 650 MG CR tablet Take 1 tablet (650 mg total) by mouth every 8 (eight) hours as needed for pain or fever. 30 tablet 0   aspirin EC 81 MG tablet Take 1 tablet (81 mg total) by mouth daily with breakfast.     calcitRIOL (ROCALTROL) 0.25 MCG capsule Take 1 capsule (0.25 mcg total) by mouth daily. 30 capsule 11   carvedilol (COREG) 25 MG tablet Take 25 mg by mouth 2 (two) times daily.     Cholecalciferol 25 MCG (1000 UT) capsule Take 5,000 Units by mouth daily.     diclofenac Sodium (VOLTAREN) 1 % GEL Apply 2 g topically 4 (four) times daily.     ferrous sulfate 325 (65 FE) MG tablet Take 325 mg by mouth daily with breakfast.     furosemide (LASIX) 20 MG tablet Take 40 mg by mouth daily.     gabapentin (NEURONTIN) 300 MG capsule Take 1 capsule (300 mg total) by mouth daily. 30 capsule 2   NIFEdipine (PROCARDIA XL/NIFEDICAL XL) 60 MG 24 hr tablet Take 1 tablet (60 mg total) by mouth daily. 30 tablet 3   omeprazole (PRILOSEC) 40 MG capsule Take 1 capsule (40 mg total) by mouth 2 (two) times daily. 60 capsule 1   OVER THE COUNTER MEDICATION D 3 5,000 IU once per day. In addition to five 1,000 IU daily.     sodium bicarbonate 650 MG tablet Take 650 mg by mouth 3 (three) times daily.     tamsulosin (FLOMAX) 0.4 MG CAPS capsule Take 0.4 mg by mouth daily.     No  facility-administered medications prior to visit.    No Known Allergies  ROS Review of Systems  Constitutional:  Negative for fever.  Eyes:  Negative for visual disturbance.  Respiratory:  Negative for chest tightness and shortness of breath.   Cardiovascular:  Negative for chest pain and palpitations.      Objective:    Physical  Exam HENT:     Head: Normocephalic.  Cardiovascular:     Rate and Rhythm: Normal rate and regular rhythm.     Pulses: Normal pulses.     Heart sounds: Normal heart sounds.  Pulmonary:     Effort: Pulmonary effort is normal.     Breath sounds: Normal breath sounds.  Neurological:     Mental Status: He is alert.     BP (!) 142/88 (BP Location: Left Arm)   Pulse 79   Ht '5\' 10"'$  (1.778 m)   Wt 201 lb 3.2 oz (91.3 kg)   SpO2 98%   BMI 28.87 kg/m  Wt Readings from Last 3 Encounters:  09/27/22 201 lb 3.2 oz (91.3 kg)  09/15/22 200 lb (90.7 kg)  08/23/22 196 lb 3.4 oz (89 kg)     Lab Results  Component Value Date   WBC 5.7 08/23/2022   HGB 10.3 (L) 08/23/2022   HCT 32.3 (L) 08/23/2022   MCV 95.8 08/23/2022   PLT 190 08/23/2022   Lab Results  Component Value Date   NA 142 08/23/2022   K 4.0 08/23/2022   CO2 19 (L) 08/23/2022   GLUCOSE 77 08/23/2022   BUN 35 (H) 08/23/2022   CREATININE 4.75 (H) 08/23/2022   BILITOT 0.3 08/23/2022   ALKPHOS 58 08/23/2022   AST 13 (L) 08/23/2022   ALT 12 08/23/2022   PROT 6.3 (L) 08/23/2022   ALBUMIN 3.4 (L) 08/23/2022   CALCIUM 9.1 08/23/2022   ANIONGAP 9 08/23/2022   Lab Results  Component Value Date   CHOL 179 08/23/2022   Lab Results  Component Value Date   HDL 62 08/23/2022   Lab Results  Component Value Date   LDLCALC 91 08/23/2022   Lab Results  Component Value Date   TRIG 129 08/23/2022   Lab Results  Component Value Date   CHOLHDL 2.9 08/23/2022   Lab Results  Component Value Date   HGBA1C 5.3 04/13/2022      Assessment & Plan:  Essential hypertension Assessment &  Plan: Uncontrolled Encouraged to follow-up with his PCP in 1 week for BP management Will defer completion of FL 2 form to his PCP, who is more familiar with the baseline of the patient's health and need for ongoing home health assistance as this is my first encounter with the patient     Follow-up: Return in about 1 week (around 10/04/2022) for BP.   Alvira Monday, FNP

## 2022-10-05 NOTE — Telephone Encounter (Signed)
Forms were picked up. 

## 2022-10-09 ENCOUNTER — Encounter: Payer: Self-pay | Admitting: Internal Medicine

## 2022-10-09 ENCOUNTER — Ambulatory Visit (INDEPENDENT_AMBULATORY_CARE_PROVIDER_SITE_OTHER): Payer: Medicaid Other | Admitting: Internal Medicine

## 2022-10-09 VITALS — BP 96/58 | HR 57 | Ht 70.0 in | Wt 196.8 lb

## 2022-10-09 DIAGNOSIS — I10 Essential (primary) hypertension: Secondary | ICD-10-CM | POA: Diagnosis not present

## 2022-10-09 DIAGNOSIS — R400 Somnolence: Secondary | ICD-10-CM

## 2022-10-09 DIAGNOSIS — G609 Hereditary and idiopathic neuropathy, unspecified: Secondary | ICD-10-CM | POA: Diagnosis not present

## 2022-10-09 DIAGNOSIS — I120 Hypertensive chronic kidney disease with stage 5 chronic kidney disease or end stage renal disease: Secondary | ICD-10-CM | POA: Diagnosis not present

## 2022-10-09 DIAGNOSIS — N185 Chronic kidney disease, stage 5: Secondary | ICD-10-CM

## 2022-10-09 NOTE — Assessment & Plan Note (Signed)
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 Theador Hawthorne On calcitriol and sodium bicarb, had not been taking meds due to constipation, advised to stay compliant

## 2022-10-09 NOTE — Assessment & Plan Note (Addendum)
Appears drowsy today, concern for alcohol abuse and/or illicit drug use Check BMP and urine drug screen

## 2022-10-09 NOTE — Progress Notes (Signed)
Acute Office Visit  Subjective:    Patient ID: Charles [name], male    DOB: 30-Jun-1956, 66 y.o.   MRN: [id number]  Chief Complaint  Patient presents with   Follow-up    For hypertension     HPI Patient is in today for follow-up of hypertension.  His BP is low normal today.  He appears drowsy today and has been sleeping during the conversation as well.  Upon further questioning, he states that he has been feeling sleepy since this morning.  He had to beers yesterday according to him.  Denies any illicit drug use.  Denies any fever, chills, cough, nasal congestion, sore throat or headache.  He has brought his medications for review, which has old and duplicate medications.  He has history of CKD stage V, and is followed by Dr. Theador Hawthorne.  He denies any dysuria, hematuria or urinary hesitance or resistance.  Past Medical History:  Diagnosis Date   Anemia    Anxiety    Chronic back pain    Dementia    Nursing facility feels he has Dementia   Diabetes mellitus    Facial numbness    Fracture of right foot    Hypertension    Mental disorder    schizophrenia   Renal insufficiency     Past Surgical History:  Procedure Laterality Date   AV FISTULA PLACEMENT Left 12/20/2021   Procedure: LEFT ARM ARTERIOVENOUS (AV) FISTULA CREATION;  Surgeon: Rosetta Posner, MD;  Location: [facility];  Service: Vascular;  Laterality: Left;   BIOPSY  08/02/2021   Procedure: BIOPSY;  Surgeon: Harvel Quale, MD;  [facility];  Service: Gastroenterology;;  duodenal and gastric biopsies   COLONOSCOPY N/A 04/11/2018   Procedure: COLONOSCOPY;  Surgeon: Rogene Houston, MD;  [facility];  Service: Endoscopy;  Laterality: N/A;  10:30   COLONOSCOPY WITH PROPOFOL N/A 07/05/2018   Procedure: COLONOSCOPY WITH PROPOFOL;  Surgeon: Rogene Houston, MD;  [facility];  Service: Endoscopy;  Laterality: N/A;  10:35   COLONOSCOPY WITH PROPOFOL N/A 08/02/2021   Procedure: COLONOSCOPY  WITH PROPOFOL;  Surgeon: Harvel Quale, MD;  [facility];  Service: Gastroenterology;  Laterality: N/A;  9:05   ESOPHAGOGASTRODUODENOSCOPY (EGD) WITH PROPOFOL N/A 08/02/2021   Procedure: ESOPHAGOGASTRODUODENOSCOPY (EGD) WITH PROPOFOL;  Surgeon: Harvel Quale, MD;  [facility];  Service: Gastroenterology;  Laterality: N/A;   left 1st toe     ORIF TOE FRACTURE  09/05/2011   Procedure: OPEN REDUCTION INTERNAL FIXATION (ORIF) METATARSAL (TOE) FRACTURE;  Surgeon: Colin Rhein;  Location: [facility];  Service: Orthopedics;  Laterality: Left;  reconstruction left great toe FHB plantar plate   POLYPECTOMY  04/11/2018   Procedure: POLYPECTOMY;  Surgeon: Rogene Houston, MD;  [facility];  Service: Endoscopy;;  colon   POLYPECTOMY  07/05/2018   Procedure: POLYPECTOMY;  Surgeon: Rogene Houston, MD;  [facility];  Service: Endoscopy;;  colon   POLYPECTOMY  08/02/2021   Procedure: POLYPECTOMY;  Surgeon: Montez Morita, Quillian Quince, MD;  [facility];  Service: Gastroenterology;;  transverse colon polyp x 1 ascending colon polyp   right foot suger      Family History  Problem Relation Age of Onset   Diabetes Brother    Colon cancer Neg Hx    Liver disease Neg Hx        unknown for sure, mom died at age  25    Social History   Socioeconomic History   Marital status: Single    Spouse name: Not on file   Number of children: Not on file   Years of education: Not on file   Highest education level: Not on file  Occupational History   Not on file  Tobacco Use   Smoking status: Every Day    Packs/day: 0.25    Years: 40.00    Total pack years: 10.00    Types: Cigarettes   Smokeless tobacco: Never   Tobacco comments:    Smoke   Vaping Use   Vaping Use: Never used  Substance and Sexual Activity   Alcohol use: Yes    Comment: every other day   Drug use: No   Sexual activity: Not on file  Other Topics  Concern   Not on file  Social History Narrative   Not on file   Social Determinants of Health   Financial Resource Strain: Not on file  Food Insecurity: No Food Insecurity (08/23/2022)   Hunger Vital Sign    Worried About Running Out of Food in the Last Year: Never true    Ran Out of Food in the Last Year: Never true  Transportation Needs: No Transportation Needs (08/23/2022)   PRAPARE - Hydrologist (Medical): No    Lack of Transportation (Non-Medical): No  Physical Activity: Not on file  Stress: Not on file  Social Connections: Not on file  Intimate Partner Violence: Not At Risk (08/23/2022)   Humiliation, Afraid, Rape, and Kick questionnaire    Fear of Current or Ex-Partner: No    Emotionally Abused: No    Physically Abused: No    Sexually Abused: No    Outpatient Medications Prior to Visit  Medication Sig Dispense Refill   acetaminophen (TYLENOL 8 HOUR) 650 MG CR tablet Take 1 tablet (650 mg total) by mouth every 8 (eight) hours as needed for pain or fever. 30 tablet 0   aspirin EC 81 MG tablet Take 1 tablet (81 mg total) by mouth daily with breakfast.     calcitRIOL (ROCALTROL) 0.25 MCG capsule Take 1 capsule (0.25 mcg total) by mouth daily. 30 capsule 11   carvedilol (COREG) 25 MG tablet Take 25 mg by mouth 2 (two) times daily.     Cholecalciferol 25 MCG (1000 UT) capsule Take 5,000 Units by mouth daily.     diclofenac Sodium (VOLTAREN) 1 % GEL Apply 2 g topically 4 (four) times daily.     ferrous sulfate 325 (65 FE) MG tablet Take 325 mg by mouth daily with breakfast.     furosemide (LASIX) 20 MG tablet Take 40 mg by mouth daily.     gabapentin (NEURONTIN) 300 MG capsule Take 1 capsule (300 mg total) by mouth daily. 30 capsule 2   NIFEdipine (PROCARDIA XL/NIFEDICAL XL) 60 MG 24 hr tablet Take 1 tablet (60 mg total) by mouth daily. 30 tablet 3   omeprazole (PRILOSEC) 40 MG capsule Take 1 capsule (40 mg total) by mouth 2 (two) times daily. 60  capsule 1   OVER THE COUNTER MEDICATION D 3 5,000 IU once per day. In addition to five 1,000 IU daily.     sodium bicarbonate 650 MG tablet Take 650 mg by mouth 3 (three) times daily.     tamsulosin (FLOMAX) 0.4 MG CAPS capsule Take 0.4 mg by mouth daily.     No facility-administered medications prior to visit.  No Known Allergies  Review of Systems  Constitutional:  Positive for fatigue. Negative for chills and fever.  HENT:  Negative for congestion and sore throat.   Eyes:  Negative for pain and discharge.  Respiratory:  Negative for cough and shortness of breath.   Cardiovascular:  Negative for chest pain and palpitations.  Gastrointestinal:  Positive for constipation. Negative for diarrhea, nausea and vomiting.  Endocrine: Negative for polydipsia and polyuria.  Genitourinary:  Negative for dysuria and hematuria.  Musculoskeletal:  Positive for arthralgias. Negative for neck pain and neck stiffness.  Skin:  Negative for rash.  Neurological:  Positive for dizziness. Negative for weakness and numbness.  Psychiatric/Behavioral:  Positive for confusion and sleep disturbance. Negative for agitation and behavioral problems.        Objective:    Physical Exam Vitals reviewed.  Constitutional:      General: He is not in acute distress.    Appearance: He is not diaphoretic.  HENT:     Head: Normocephalic and atraumatic.     Nose: Nose normal.     Mouth/Throat:     Mouth: Mucous membranes are moist.  Eyes:     General: No scleral icterus.    Extraocular Movements: Extraocular movements intact.  Cardiovascular:     Rate and Rhythm: Normal rate and regular rhythm.     Pulses: Normal pulses.     Heart sounds: Normal heart sounds. No murmur heard. Pulmonary:     Breath sounds: Normal breath sounds. No wheezing or rales.  Musculoskeletal:     Cervical back: Neck supple. No tenderness.     Right lower leg: No edema.     Left lower leg: No edema.  Skin:    General: Skin is  warm.     Findings: No rash.  Neurological:     General: No focal deficit present.     Mental Status: He is oriented to person, place, and time. He is lethargic.  Psychiatric:        Mood and Affect: Affect is flat.        Speech: Speech is delayed.        Behavior: Behavior is slowed.     BP (!) 96/58 (BP Location: Right Arm, Patient Position: Sitting, Cuff Size: Large)   Pulse (!) 57   Ht '5\' 10"'$  (1.778 m)   Wt 196 lb 12.8 oz (89.3 kg)   SpO2 98%   BMI 28.24 kg/m  Wt Readings from Last 3 Encounters:  10/09/22 196 lb 12.8 oz (89.3 kg)  09/27/22 201 lb 3.2 oz (91.3 kg)  09/15/22 200 lb (90.7 kg)        Assessment & Plan:   Problem List Items Addressed This Visit       Cardiovascular and Mediastinum   Essential hypertension    BP Readings from Last 1 Encounters:  10/09/22 (!) 96/58  Well controlled today, but has been uncontrolled due to noncompliance Followed by nephrology On Coreg and 25 mg twice daily and nifedipine 60 mg daily - concern for missing doses and duplicating doses at times Counseled for compliance with the medications Advised DASH diet and moderate exercise/walking, at least 150 mins/week         Nervous and Auditory   Idiopathic peripheral neuropathy    Takes gabapentin 300 mg TID, refilled        Genitourinary   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 Theador Hawthorne On calcitriol and sodium bicarb, had not been taking meds due to constipation, advised to stay compliant      Relevant Orders   Basic Metabolic Panel (BMET)     Other   Drowsiness    Appears drowsy today, concern for alcohol abuse and/or illicit drug use Check BMP and urine drug screen      Relevant Orders   Urine Drug Panel 7   Basic Metabolic Panel (BMET)     No orders of the  defined types were placed in this encounter.    Lindell Spar, MD

## 2022-10-09 NOTE — Assessment & Plan Note (Signed)
Takes gabapentin 300 mg TID, refilled

## 2022-10-09 NOTE — Patient Instructions (Addendum)
Please continue taking medications as prescribed.  Please refrain from alcohol use.  Please follow low salt diet and ambulate as tolerated.

## 2022-10-09 NOTE — Assessment & Plan Note (Signed)
BP Readings from Last 1 Encounters:  10/09/22 (!) 96/58   Well controlled today, but has been uncontrolled due to noncompliance Followed by nephrology On Coreg and 25 mg twice daily and nifedipine 60 mg daily - concern for missing doses and duplicating doses at times Counseled for compliance with the medications Advised DASH diet and moderate exercise/walking, at least 150 mins/week

## 2022-10-10 LAB — BASIC METABOLIC PANEL
BUN/Creatinine Ratio: 8 — ABNORMAL LOW (ref 10–24)
BUN: 43 mg/dL — ABNORMAL HIGH (ref 8–27)
CO2: 16 mmol/L — ABNORMAL LOW (ref 20–29)
Calcium: 9.4 mg/dL (ref 8.6–10.2)
Chloride: 110 mmol/L — ABNORMAL HIGH (ref 96–106)
Creatinine, Ser: 5.3 mg/dL — ABNORMAL HIGH (ref 0.76–1.27)
Glucose: 140 mg/dL — ABNORMAL HIGH (ref 70–99)
Potassium: 5.2 mmol/L (ref 3.5–5.2)
Sodium: 138 mmol/L (ref 134–144)
eGFR: 11 mL/min/{1.73_m2} — ABNORMAL LOW (ref >59)

## 2022-10-11 LAB — URINE DRUG PANEL 7
Amphetamines, Urine: NEGATIVE ng/mL
Barbiturate Quant, Ur: NEGATIVE ng/mL
Benzodiazepine Quant, Ur: NEGATIVE ng/mL
Cannabinoid Quant, Ur: NEGATIVE ng/mL
Cocaine (Metab.): NEGATIVE ng/mL
Opiate Quant, Ur: NEGATIVE ng/mL
PCP Quant, Ur: NEGATIVE ng/mL

## 2022-10-25 ENCOUNTER — Ambulatory Visit: Payer: Medicaid Other | Admitting: Podiatry

## 2022-12-05 ENCOUNTER — Ambulatory Visit: Payer: Medicaid Other | Admitting: Podiatry

## 2022-12-05 VITALS — BP 142/88

## 2022-12-05 DIAGNOSIS — M2042 Other hammer toe(s) (acquired), left foot: Secondary | ICD-10-CM | POA: Diagnosis not present

## 2022-12-05 DIAGNOSIS — L84 Corns and callosities: Secondary | ICD-10-CM

## 2022-12-05 DIAGNOSIS — M2041 Other hammer toe(s) (acquired), right foot: Secondary | ICD-10-CM

## 2022-12-06 ENCOUNTER — Encounter: Payer: Self-pay | Admitting: Podiatry

## 2022-12-06 NOTE — Progress Notes (Signed)
This patient returns to my office for at risk foot care.  This patient requires this care by a professional since this patient will be at risk due to having diabetes.  He says the second toes both feet have become intensely painful.  He says he experiences pain on the tip of both second toes walking in shoes and during sleep.  He presents to the office for evaluation and treatment.   General Appearance  Alert, conversant and in no acute stress.  Vascular  Dorsalis pedis and posterior tibial  pulses are  weakly palpable  bilaterally.  Capillary return is within normal limits  bilaterally. Temperature is within normal limits  bilaterally.  Neurologic  Senn-Weinstein monofilament wire test within normal limits  bilaterally. Muscle power within normal limits bilaterally.  Nails Thick disfigured discolored nails with subungual debris  from hallux to fifth toes bilaterally. No evidence of bacterial infection or drainage bilaterally.  Orthopedic  No limitations of motion  feet .  No crepitus or effusions noted.  No bony pathology or digital deformities noted.HAV  B/L.  Hammer toes 2-5  B/L.  Skin  normotropic skin with no porokeratosis noted bilaterally.  No signs of infections or ulcers noted.   Callus over dorsomedial exostosis left foot. Clavi second toes  B/L  Callus/clavi second toe  B/L.  Hammer toes  second  B/L.  Consent was obtained for treatment procedures.  Patient says the toes are painful and would like to discuss surgery.  Discussed surgery and he will be referred to Dr.  Posey Pronto.  Debride with dremel too and padding dispensed.   Return office visit   3 months.                  Told patient to return for periodic foot care and evaluation due to potential at risk complications.   Gardiner Barefoot DPM

## 2022-12-12 ENCOUNTER — Ambulatory Visit: Payer: Medicaid Other | Admitting: Podiatry

## 2022-12-12 DIAGNOSIS — M2042 Other hammer toe(s) (acquired), left foot: Secondary | ICD-10-CM | POA: Diagnosis not present

## 2022-12-12 DIAGNOSIS — M2041 Other hammer toe(s) (acquired), right foot: Secondary | ICD-10-CM | POA: Diagnosis not present

## 2022-12-12 NOTE — Progress Notes (Signed)
Subjective:  Patient ID: Charles [name], male    DOB: 14-Aug-1956,  MRN: [id number]  Chief Complaint  Patient presents with   Callouses    RM 19 Bilateral 2nd toe callus. Pt states toes are very sore and causes pain during the night. Calluses debrided 7 days ago.     67 y.o. male presents with the above complaint.  Patient presents with bilateral second digit distal tip callus.  Patient states causing him pain and soreness.  He has seen Dr. Prudence Davidson few days ago and has been hurting him since then.  He wanted get it evaluated he tried all conservative care none of which has helped.  He briefly wanted to discuss surgical options.   Review of Systems: Negative except as noted in the HPI. Denies N/V/F/Ch.  Past Medical History:  Diagnosis Date   Anemia    Anxiety    Chronic back pain    Dementia    Nursing facility feels he has Dementia   Diabetes mellitus    Facial numbness    Fracture of right foot    Hypertension    Mental disorder    schizophrenia   Renal insufficiency     Current Outpatient Medications:    acetaminophen (TYLENOL 8 HOUR) 650 MG CR tablet, Take 1 tablet (650 mg total) by mouth every 8 (eight) hours as needed for pain or fever., Disp: 30 tablet, Rfl: 0   aspirin EC 81 MG tablet, Take 1 tablet (81 mg total) by mouth daily with breakfast., Disp: , Rfl:    calcitRIOL (ROCALTROL) 0.25 MCG capsule, Take 1 capsule (0.25 mcg total) by mouth daily., Disp: 30 capsule, Rfl: 11   carvedilol (COREG) 25 MG tablet, Take 25 mg by mouth 2 (two) times daily., Disp: , Rfl:    Cholecalciferol 25 MCG (1000 UT) capsule, Take 5,000 Units by mouth daily., Disp: , Rfl:    diclofenac Sodium (VOLTAREN) 1 % GEL, Apply 2 g topically 4 (four) times daily., Disp: , Rfl:    ferrous sulfate 325 (65 FE) MG tablet, Take 325 mg by mouth daily with breakfast., Disp: , Rfl:    furosemide (LASIX) 20 MG tablet, Take 40 mg by mouth daily., Disp: , Rfl:    gabapentin (NEURONTIN) 300 MG capsule, Take 1  capsule (300 mg total) by mouth daily., Disp: 30 capsule, Rfl: 2   NIFEdipine (PROCARDIA XL/NIFEDICAL XL) 60 MG 24 hr tablet, Take 1 tablet (60 mg total) by mouth daily., Disp: 30 tablet, Rfl: 3   omeprazole (PRILOSEC) 40 MG capsule, Take 1 capsule (40 mg total) by mouth 2 (two) times daily., Disp: 60 capsule, Rfl: 1   OVER THE COUNTER MEDICATION, D 3 5,000 IU once per day. In addition to five 1,000 IU daily., Disp: , Rfl:    sodium bicarbonate 650 MG tablet, Take 650 mg by mouth 3 (three) times daily., Disp: , Rfl:    tamsulosin (FLOMAX) 0.4 MG CAPS capsule, Take 0.4 mg by mouth daily., Disp: , Rfl:   Social History   Tobacco Use  Smoking Status Every Day   Packs/day: 0.25   Years: 40.00   Total pack years: 10.00   Types: Cigarettes  Smokeless Tobacco Never  Tobacco Comments   Smoke     No Known Allergies Objective:  There were no vitals filed for this visit. There is no height or weight on file to calculate BMI. Constitutional Well developed. Well nourished.  Vascular Dorsalis pedis pulses palpable bilaterally. Posterior tibial pulses palpable bilaterally.  Capillary refill normal to all digits.  No cyanosis or clubbing noted. Pedal hair growth normal.  Neurologic Normal speech. Oriented to person, place, and time. Epicritic sensation to light touch grossly present bilaterally.  Dermatologic Nails well groomed and normal in appearance. No open wounds. No skin lesions.  Orthopedic: Bilateral second digit hammertoe contracture noted rigid in nature.  Distal tip hyperkeratotic lesion noted.  Pain on palpation to the hammertoe   Radiographs: None Assessment:   1. Hammertoe of second toe of right foot   2. Hammertoe of second toe of left foot    Plan:  Patient was evaluated and treated and all questions answered.  Bilateral second digit hammertoe contracture -All questions and concerns were discussed with the patient in extensive detail -I discussed with the patient that  given the nature of the pain that he is experiencing he will benefit from surgery to help straighten out the toe and take the pressure off from the distal tip.  He states he will think about the procedure and will get back to me when he is ready.  He can return back to Dr. Prudence Davidson for routine footcare  No follow-ups on file.

## 2023-01-23 ENCOUNTER — Telehealth: Payer: Self-pay | Admitting: Internal Medicine

## 2023-01-23 ENCOUNTER — Other Ambulatory Visit: Payer: Self-pay

## 2023-01-23 DIAGNOSIS — G609 Hereditary and idiopathic neuropathy, unspecified: Secondary | ICD-10-CM

## 2023-01-23 MED ORDER — GABAPENTIN 300 MG PO CAPS: 300.0000 mg | ORAL_CAPSULE | Freq: Every day | ORAL | 2 refills | Status: DC

## 2023-01-23 NOTE — Telephone Encounter (Signed)
Prescription Request  01/23/2023  LOV: 10/09/2022  What is the name of the medication or equipment? gabapentin (NEURONTIN) 300 MG capsule   Have you contacted your pharmacy to request a refill? Yes   Which pharmacy would you like this sent to?  [place], [place] [address] Phone: [phone] Fax: [phone]    Patient notified that their request is being sent to the clinical staff for review and that they should receive a response within 2 business days.   Please advise at  walked in  PER PATIENT DOES NOT GET FROM KIDNEY DR ANY LONGER.

## 2023-01-23 NOTE — Telephone Encounter (Signed)
Refill sent.

## 2023-01-25 ENCOUNTER — Ambulatory Visit (INDEPENDENT_AMBULATORY_CARE_PROVIDER_SITE_OTHER): Payer: 59 | Admitting: Internal Medicine

## 2023-01-25 ENCOUNTER — Encounter: Payer: Self-pay | Admitting: Internal Medicine

## 2023-01-25 VITALS — BP 132/84 | HR 80 | Ht 70.0 in | Wt 191.6 lb

## 2023-01-25 DIAGNOSIS — Z720 Tobacco use: Secondary | ICD-10-CM | POA: Diagnosis not present

## 2023-01-25 DIAGNOSIS — I120 Hypertensive chronic kidney disease with stage 5 chronic kidney disease or end stage renal disease: Secondary | ICD-10-CM | POA: Diagnosis not present

## 2023-01-25 DIAGNOSIS — N401 Enlarged prostate with lower urinary tract symptoms: Secondary | ICD-10-CM

## 2023-01-25 DIAGNOSIS — Z23 Encounter for immunization: Secondary | ICD-10-CM

## 2023-01-25 DIAGNOSIS — R369 Urethral discharge, unspecified: Secondary | ICD-10-CM

## 2023-01-25 DIAGNOSIS — N185 Chronic kidney disease, stage 5: Secondary | ICD-10-CM

## 2023-01-25 DIAGNOSIS — I10 Essential (primary) hypertension: Secondary | ICD-10-CM

## 2023-01-25 DIAGNOSIS — R351 Nocturia: Secondary | ICD-10-CM

## 2023-01-25 DIAGNOSIS — G609 Hereditary and idiopathic neuropathy, unspecified: Secondary | ICD-10-CM

## 2023-01-25 DIAGNOSIS — L304 Erythema intertrigo: Secondary | ICD-10-CM | POA: Diagnosis not present

## 2023-01-25 MED ORDER — KETOCONAZOLE 2 % EX CREA: 1.0000 | TOPICAL_CREAM | Freq: Every day | CUTANEOUS | 0 refills | Status: DC

## 2023-01-25 NOTE — Assessment & Plan Note (Signed)
BP Readings from Last 1 Encounters:  01/25/23 132/84   Well controlled today, but has been uncontrolled due to noncompliance Followed by nephrology On Coreg 25 mg twice daily and nifedipine 60 mg daily - concern for missing doses and duplicating doses at times Counseled for compliance with the medications Advised DASH diet and moderate exercise/walking, at least 150 mins/week

## 2023-01-25 NOTE — Assessment & Plan Note (Signed)
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 Bhutani On calcitriol and sodium bicarb, had not been taking meds due to constipation, advised to stay compliant 

## 2023-01-25 NOTE — Assessment & Plan Note (Addendum)
Smokes about 0.3 pack/day  Asked about quitting: confirms that he/she currently smokes cigarettes Advise to quit smoking: Educated about QUITTING to reduce the risk of cancer, cardio and cerebrovascular disease. Assess willingness: Unwilling to quit at this time, but is working on cutting back. Assist with counseling and pharmacotherapy: Counseled for 5 minutes and literature provided. Arrange for follow up: follow up in 3 months and continue to offer help. 

## 2023-01-25 NOTE — Patient Instructions (Addendum)
Please continue taking Carvedilol and Nifedipine for blood pressure.  Please apply Ketoconazole cream in the groin area/itching spots.  Please continue to follow low salt diet and perform moderate exercise/walking at least 150 mins/week.  Please consider getting Shingrix and Tdap vaccine at local pharmacy.

## 2023-01-25 NOTE — Assessment & Plan Note (Signed)
On tamsulosin.

## 2023-01-25 NOTE — Progress Notes (Signed)
Established Patient Office Visit  Subjective:  Patient ID: Charles [name], male    DOB: 1955/12/23  Age: 67 y.o. MRN: [id number]  CC:  Chief Complaint  Patient presents with   Hypertension    Follow up for hypertension and CKD. Patient states off and on he feels weak and has no energy. He also states he is itching in private regions.    HPI Charles [name] is a 67 y.o. male with past medical history of HTN, CKD, BPH, anemia of chronic disease, prediabetes, chronic constipation and tobacco abuse who presents for f/u of his chronic medical conditions.  HTN: His blood pressure is better today his blood pressure was low normal in the last visit, and was having drowsiness at that time.  Today, he appears more alert.  He is still supposed to take Coreg and nifedipine, as prescribed by his nephrologist.  It is not clear if he is taking both currently.  He agrees to continue his current home regimen and bring his medications for review in the next visit.  Denies any chest pain, dyspnea or palpitations currently.  He has history of CKD stage V, and is followed by Dr. Theador Hawthorne.  He denies any dysuria, hematuria or urinary hesitance or resistance.  He reports noticing dried out discharge in his undergarments.  Denies any dysuria or hematuria currently.  He likely has urethral discharge.  He also reports itching in the groin and perirectal area.  Past Medical History:  Diagnosis Date   Anemia    Anxiety    Chronic back pain    Dementia    Nursing facility feels he has Dementia   Diabetes mellitus    Facial numbness    Fracture of right foot    Hypertension    Mental disorder    schizophrenia   Renal insufficiency     Past Surgical History:  Procedure Laterality Date   AV FISTULA PLACEMENT Left 12/20/2021   Procedure: LEFT ARM ARTERIOVENOUS (AV) FISTULA CREATION;  Surgeon: Rosetta Posner, MD;  Location: [facility];  Service: Vascular;  Laterality: Left;   BIOPSY  08/02/2021   Procedure: BIOPSY;   Surgeon: Harvel Quale, MD;  Location: [facility];  Service: Gastroenterology;;  duodenal and gastric biopsies   COLONOSCOPY N/A 04/11/2018   Procedure: COLONOSCOPY;  Surgeon: Rogene Houston, MD;  Location: [facility];  Service: Endoscopy;  Laterality: N/A;  10:30   COLONOSCOPY WITH PROPOFOL N/A 07/05/2018   Procedure: COLONOSCOPY WITH PROPOFOL;  Surgeon: Rogene Houston, MD;  Location: [facility];  Service: Endoscopy;  Laterality: N/A;  10:35   COLONOSCOPY WITH PROPOFOL N/A 08/02/2021   Procedure: COLONOSCOPY WITH PROPOFOL;  Surgeon: Harvel Quale, MD;  Location: [facility];  Service: Gastroenterology;  Laterality: N/A;  9:05   ESOPHAGOGASTRODUODENOSCOPY (EGD) WITH PROPOFOL N/A 08/02/2021   Procedure: ESOPHAGOGASTRODUODENOSCOPY (EGD) WITH PROPOFOL;  Surgeon: Harvel Quale, MD;  Location: [facility];  Service: Gastroenterology;  Laterality: N/A;   left 1st toe     ORIF TOE FRACTURE  09/05/2011   Procedure: OPEN REDUCTION INTERNAL FIXATION (ORIF) METATARSAL (TOE) FRACTURE;  Surgeon: Colin Rhein;  Location: [facility];  Service: Orthopedics;  Laterality: Left;  reconstruction left great toe FHB plantar plate   POLYPECTOMY  04/11/2018   Procedure: POLYPECTOMY;  Surgeon: Rogene Houston, MD;  Location: [facility];  Service: Endoscopy;;  colon   POLYPECTOMY  07/05/2018   Procedure: POLYPECTOMY;  Surgeon: Hildred Laser  U, MD;  Location: [facility];  Service: Endoscopy;;  colon   POLYPECTOMY  08/02/2021   Procedure: POLYPECTOMY;  Surgeon: Montez Morita, Quillian Quince, MD;  Location: [facility];  Service: Gastroenterology;;  transverse colon polyp x 1 ascending colon polyp   right foot suger      Family History  Problem Relation Age of Onset   Diabetes Brother    Colon cancer Neg Hx    Liver disease Neg Hx        unknown for sure, mom died at age 38    Social History   Socioeconomic History   Marital status: Single     Spouse name: Not on file   Number of children: Not on file   Years of education: Not on file   Highest education level: Not on file  Occupational History   Not on file  Tobacco Use   Smoking status: Every Day    Packs/day: 0.25    Years: 40.00    Additional pack years: 0.00    Total pack years: 10.00    Types: Cigarettes   Smokeless tobacco: Never   Tobacco comments:    Smoke   Vaping Use   Vaping Use: Never used  Substance and Sexual Activity   Alcohol use: Yes    Comment: every other day   Drug use: No   Sexual activity: Not on file  Other Topics Concern   Not on file  Social History Narrative   Not on file   Social Determinants of Health   Financial Resource Strain: Not on file  Food Insecurity: No Food Insecurity (08/23/2022)   Hunger Vital Sign    Worried About Running Out of Food in the Last Year: Never true    Ran Out of Food in the Last Year: Never true  Transportation Needs: No Transportation Needs (08/23/2022)   PRAPARE - Hydrologist (Medical): No    Lack of Transportation (Non-Medical): No  Physical Activity: Not on file  Stress: Not on file  Social Connections: Not on file  Intimate Partner Violence: Not At Risk (08/23/2022)   Humiliation, Afraid, Rape, and Kick questionnaire    Fear of Current or Ex-Partner: No    Emotionally Abused: No    Physically Abused: No    Sexually Abused: No    Outpatient Medications Prior to Visit  Medication Sig Dispense Refill   acetaminophen (TYLENOL 8 HOUR) 650 MG CR tablet Take 1 tablet (650 mg total) by mouth every 8 (eight) hours as needed for pain or fever. 30 tablet 0   aspirin EC 81 MG tablet Take 1 tablet (81 mg total) by mouth daily with breakfast.     calcitRIOL (ROCALTROL) 0.25 MCG capsule Take 1 capsule (0.25 mcg total) by mouth daily. 30 capsule 11   carvedilol (COREG) 25 MG tablet Take 25 mg by mouth 2 (two) times daily.     Cholecalciferol 25 MCG (1000 UT) capsule Take 5,000  Units by mouth daily.     diclofenac Sodium (VOLTAREN) 1 % GEL Apply 2 g topically 4 (four) times daily.     ferrous sulfate 325 (65 FE) MG tablet Take 325 mg by mouth daily with breakfast.     furosemide (LASIX) 20 MG tablet Take 40 mg by mouth daily.     gabapentin (NEURONTIN) 300 MG capsule Take 1 capsule (300 mg total) by mouth daily. 30 capsule 2   NIFEdipine (PROCARDIA XL/NIFEDICAL XL) 60 MG 24 hr tablet  Take 1 tablet (60 mg total) by mouth daily. 30 tablet 3   omeprazole (PRILOSEC) 40 MG capsule Take 1 capsule (40 mg total) by mouth 2 (two) times daily. 60 capsule 1   OVER THE COUNTER MEDICATION D 3 5,000 IU once per day. In addition to five 1,000 IU daily.     sodium bicarbonate 650 MG tablet Take 650 mg by mouth 3 (three) times daily.     tamsulosin (FLOMAX) 0.4 MG CAPS capsule Take 0.4 mg by mouth daily.     No facility-administered medications prior to visit.    No Known Allergies  ROS Review of Systems  Constitutional:  Positive for fatigue. Negative for chills and fever.  HENT:  Negative for congestion and sore throat.   Eyes:  Negative for pain and discharge.  Respiratory:  Negative for cough and shortness of breath.   Cardiovascular:  Negative for chest pain and palpitations.  Gastrointestinal:  Positive for constipation. Negative for diarrhea, nausea and vomiting.  Endocrine: Negative for polydipsia and polyuria.  Genitourinary:  Positive for penile discharge. Negative for dysuria and hematuria.  Musculoskeletal:  Positive for arthralgias. Negative for neck pain and neck stiffness.  Skin:  Negative for rash.  Neurological:  Positive for weakness. Negative for dizziness and numbness.  Psychiatric/Behavioral:  Positive for sleep disturbance. Negative for agitation and behavioral problems.       Objective:    Physical Exam Vitals reviewed.  Constitutional:      General: He is not in acute distress.    Appearance: He is not diaphoretic.  HENT:     Head:  Normocephalic and atraumatic.     Nose: Nose normal.     Mouth/Throat:     Mouth: Mucous membranes are moist.  Eyes:     General: No scleral icterus.    Extraocular Movements: Extraocular movements intact.  Cardiovascular:     Rate and Rhythm: Normal rate and regular rhythm.     Pulses: Normal pulses.     Heart sounds: Normal heart sounds. No murmur heard. Pulmonary:     Breath sounds: Normal breath sounds. No wheezing or rales.  Abdominal:     Palpations: Abdomen is soft.     Tenderness: There is no abdominal tenderness.  Musculoskeletal:     Cervical back: Neck supple. No tenderness.     Right lower leg: No edema.     Left lower leg: No edema.  Skin:    General: Skin is warm.     Findings: Rash (Dark brownish patch in groin area) present.  Neurological:     General: No focal deficit present.     Mental Status: He is oriented to person, place, and time. He is lethargic.  Psychiatric:        Mood and Affect: Mood normal. Affect is flat.     BP 132/84 (BP Location: Left Arm, Cuff Size: Normal)   Pulse 80   Ht 5\' 10"  (1.778 m)   Wt 191 lb 9.6 oz (86.9 kg)   SpO2 95%   BMI 27.49 kg/m  Wt Readings from Last 3 Encounters:  01/25/23 191 lb 9.6 oz (86.9 kg)  10/09/22 196 lb 12.8 oz (89.3 kg)  09/27/22 201 lb 3.2 oz (91.3 kg)     Lab Results  Component Value Date   WBC 5.7 08/23/2022   HGB 10.3 (L) 08/23/2022   HCT 32.3 (L) 08/23/2022   MCV 95.8 08/23/2022   PLT 190 08/23/2022   Lab Results  Component Value Date  NA 138 10/09/2022   K 5.2 10/09/2022   CO2 16 (L) 10/09/2022   GLUCOSE 140 (H) 10/09/2022   BUN 43 (H) 10/09/2022   CREATININE 5.30 (H) 10/09/2022   BILITOT 0.3 08/23/2022   ALKPHOS 58 08/23/2022   AST 13 (L) 08/23/2022   ALT 12 08/23/2022   PROT 6.3 (L) 08/23/2022   ALBUMIN 3.4 (L) 08/23/2022   CALCIUM 9.4 10/09/2022   ANIONGAP 9 08/23/2022   EGFR 11 (L) 10/09/2022   Lab Results  Component Value Date   CHOL 179 08/23/2022   Lab Results   Component Value Date   HDL 62 08/23/2022   Lab Results  Component Value Date   LDLCALC 91 08/23/2022   Lab Results  Component Value Date   TRIG 129 08/23/2022   Lab Results  Component Value Date   CHOLHDL 2.9 08/23/2022   Lab Results  Component Value Date   HGBA1C 5.3 04/13/2022      Assessment & Plan:   Problem List Items Addressed This Visit       Cardiovascular and Mediastinum   Essential hypertension - Primary    BP Readings from Last 1 Encounters:  01/25/23 132/84  Well controlled today, but has been uncontrolled due to noncompliance Followed by nephrology On Coreg 25 mg twice daily and nifedipine 60 mg daily - concern for missing doses and duplicating doses at times Counseled for compliance with the medications Advised DASH diet and moderate exercise/walking, at least 150 mins/week         Nervous and Auditory   Idiopathic peripheral neuropathy    Takes gabapentin 300 mg qHS, refilled        Genitourinary   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 Theador Hawthorne On calcitriol and sodium bicarb, had not been taking meds due to constipation, advised to stay compliant      BPH (benign prostatic hyperplasia)    On tamsulosin        Other   Tobacco abuse    Smokes about 0.3 pack/day  Asked about quitting: confirms that he/she currently smokes cigarettes Advise to quit smoking: Educated about QUITTING to reduce the risk of cancer, cardio and cerebrovascular disease. Assess willingness: Unwilling to quit at this time, but is working on cutting back. Assist with counseling and pharmacotherapy: Counseled for 5 minutes and literature provided. Arrange for follow up: follow up in 3 months and continue to offer help.      Urethral discharge    Has dried out discharge in  undergarment, likely urethral discharge Check UA with reflex culture and urine neisseria gonorrhea, chlamydia Ketoconazole cream for intertrigo Advised to keep area clean and dry      Relevant Orders   UA/M w/rflx Culture, Routine   Chlamydia/GC NAA, Confirmation   Other Visit Diagnoses     Intertrigo       Relevant Medications   ketoconazole (NIZORAL) 2 % cream   Need for pneumococcal 20-valent conjugate vaccination       Relevant Orders   Pneumococcal conjugate vaccine 20-valent (Completed)       Meds ordered this encounter  Medications   ketoconazole (NIZORAL) 2 % cream    Sig: Apply 1 Application topically daily.    Dispense:  15 g    Refill:  0    Follow-up: Return in about 4 months (  around 05/27/2023) for Annual physical.    Lindell Spar, MD

## 2023-01-25 NOTE — Assessment & Plan Note (Addendum)
Takes gabapentin 300 mg qHS, refilled

## 2023-01-25 NOTE — Assessment & Plan Note (Signed)
Has dried out discharge in undergarment, likely urethral discharge Check UA with reflex culture and urine neisseria gonorrhea, chlamydia Ketoconazole cream for intertrigo Advised to keep area clean and dry

## 2023-01-26 LAB — MICROSCOPIC EXAMINATION
Bacteria, UA: NONE SEEN
Casts: NONE SEEN /lpf
Epithelial Cells (non renal): NONE SEEN /hpf (ref 0–10)
RBC, Urine: NONE SEEN /hpf (ref 0–2)

## 2023-01-26 LAB — UA/M W/RFLX CULTURE, ROUTINE
Bilirubin, UA: NEGATIVE
Ketones, UA: NEGATIVE
Leukocytes,UA: NEGATIVE
Nitrite, UA: NEGATIVE
Specific Gravity, UA: 1.017 (ref 1.005–1.030)
Urobilinogen, Ur: 0.2 mg/dL (ref 0.2–1.0)
pH, UA: 5.5 (ref 5.0–7.5)

## 2023-01-28 LAB — CHLAMYDIA/GC NAA, CONFIRMATION
Chlamydia trachomatis, NAA: NEGATIVE
Neisseria gonorrhoeae, NAA: NEGATIVE

## 2023-02-05 ENCOUNTER — Telehealth: Payer: Self-pay | Admitting: Internal Medicine

## 2023-02-05 NOTE — Telephone Encounter (Signed)
Charles [name] called from house call a screening report.Evaluated blood pressure 172/102 and second time 156/100. pvd screening 0.25 left foot and 0.26 right foot. No symptoms in legs or feet.  He has not taken his blood pressure, patient took it when house call nurse left  Ladene Artist return # [phone]

## 2023-02-12 ENCOUNTER — Encounter (HOSPITAL_COMMUNITY): Payer: Self-pay | Admitting: Emergency Medicine

## 2023-02-12 ENCOUNTER — Emergency Department (HOSPITAL_COMMUNITY)
Admission: EM | Admit: 2023-02-12 | Discharge: 2023-02-12 | Disposition: A | Discharge status: Home / Self Care | Payer: 59 | Attending: Emergency Medicine | Admitting: Emergency Medicine

## 2023-02-12 ENCOUNTER — Other Ambulatory Visit: Payer: Self-pay

## 2023-02-12 ENCOUNTER — Ambulatory Visit: Payer: Medicaid Other | Admitting: Internal Medicine

## 2023-02-12 DIAGNOSIS — Z7982 Long term (current) use of aspirin: Secondary | ICD-10-CM | POA: Insufficient documentation

## 2023-02-12 DIAGNOSIS — R5383 Other fatigue: Secondary | ICD-10-CM | POA: Insufficient documentation

## 2023-02-12 DIAGNOSIS — R531 Weakness: Secondary | ICD-10-CM

## 2023-02-12 DIAGNOSIS — Z79899 Other long term (current) drug therapy: Secondary | ICD-10-CM | POA: Diagnosis not present

## 2023-02-12 DIAGNOSIS — E860 Dehydration: Secondary | ICD-10-CM | POA: Insufficient documentation

## 2023-02-12 DIAGNOSIS — N189 Chronic kidney disease, unspecified: Secondary | ICD-10-CM | POA: Diagnosis not present

## 2023-02-12 LAB — CBC WITH DIFFERENTIAL/PLATELET
Abs Immature Granulocytes: 0.01 10*3/uL (ref 0.00–0.07)
Basophils Absolute: 0 10*3/uL (ref 0.0–0.1)
Basophils Relative: 1 %
Eosinophils Absolute: 0.1 10*3/uL (ref 0.0–0.5)
Eosinophils Relative: 1 %
HCT: 28.6 % — ABNORMAL LOW (ref 39.0–52.0)
Hemoglobin: 8.7 g/dL — ABNORMAL LOW (ref 13.0–17.0)
Immature Granulocytes: 0 %
Lymphocytes Relative: 19 %
Lymphs Abs: 1.2 10*3/uL (ref 0.7–4.0)
MCH: 29.5 pg (ref 26.0–34.0)
MCHC: 30.4 g/dL (ref 30.0–36.0)
MCV: 96.9 fL (ref 80.0–100.0)
Monocytes Absolute: 0.3 10*3/uL (ref 0.1–1.0)
Monocytes Relative: 5 %
Neutro Abs: 4.7 10*3/uL (ref 1.7–7.7)
Neutrophils Relative %: 74 %
Platelets: 215 10*3/uL (ref 150–400)
RBC: 2.95 MIL/uL — ABNORMAL LOW (ref 4.22–5.81)
RDW: 14.6 % (ref 11.5–15.5)
WBC: 6.3 10*3/uL (ref 4.0–10.5)
nRBC: 0 % (ref 0.0–0.2)

## 2023-02-12 LAB — BASIC METABOLIC PANEL
Anion gap: 6 (ref 5–15)
BUN: 54 mg/dL — ABNORMAL HIGH (ref 8–23)
CO2: 15 mmol/L — ABNORMAL LOW (ref 22–32)
Calcium: 8.8 mg/dL — ABNORMAL LOW (ref 8.9–10.3)
Chloride: 114 mmol/L — ABNORMAL HIGH (ref 98–111)
Creatinine, Ser: 6.9 mg/dL — ABNORMAL HIGH (ref 0.61–1.24)
GFR, Estimated: 8 mL/min — ABNORMAL LOW (ref >60)
Glucose, Bld: 95 mg/dL (ref 70–99)
Potassium: 5.1 mmol/L (ref 3.5–5.1)
Sodium: 135 mmol/L (ref 135–145)

## 2023-02-12 LAB — MAGNESIUM: Magnesium: 2.3 mg/dL (ref 1.7–2.4)

## 2023-02-12 LAB — LACTIC ACID, PLASMA: Lactic Acid, Venous: 0.9 mmol/L (ref 0.5–1.9)

## 2023-02-12 MED ORDER — SODIUM CHLORIDE 0.9 % IV BOLUS
500.0000 mL | Freq: Once | INTRAVENOUS | Status: AC
  Administered 2023-02-12: 500 mL via INTRAVENOUS

## 2023-02-12 NOTE — ED Provider Notes (Signed)
[place] EMERGENCY DEPARTMENT AT [facility] Provider Note   CSN: [id number] Arrival date & time: 02/12/23  1639     History {Add pertinent medical, surgical, social history, OB history to HPI:1} Chief Complaint  Patient presents with   Weakness    Charles [name] is a 67 y.o. male.  He has a history of CKD.  He said he was fine when he woke up this morning and then he took his regular medications and went to his kidney doctor appointment.  There he felt very fatigued and felt like he needed to lay down.  Blood pressures were noted to be low.  Ambulance was called and brought to the emergency department for further evaluation.  Patient states he feels at baseline now he is hungry and wants something to eat.  He denies any headache chest pain shortness of breath abdominal pain.  He said he sometimes gets some cramps in his legs but no numbness or weakness.  He feels like he has been eating and drinking normally.  The history is provided by the patient.  Weakness Severity:  Moderate Onset quality:  Sudden Progression:  Resolved Chronicity:  Recurrent Relieved by:  None tried Worsened by:  Nothing Ineffective treatments:  None tried Associated symptoms: lethargy   Associated symptoms: no abdominal pain, no chest pain, no cough, no diarrhea, no dysuria, no fever, no nausea, no shortness of breath, no syncope and no vomiting        Home Medications Prior to Admission medications   Medication Sig Start Date End Date Taking? Authorizing Provider  acetaminophen (TYLENOL 8 HOUR) 650 MG CR tablet Take 1 tablet (650 mg total) by mouth every 8 (eight) hours as needed for pain or fever. 07/28/20   Derwood Kaplan, MD  aspirin EC 81 MG tablet Take 1 tablet (81 mg total) by mouth daily with breakfast. 07/15/18   Rehman, Joline Maxcy, MD  calcitRIOL (ROCALTROL) 0.25 MCG capsule Take 1 capsule (0.25 mcg total) by mouth daily. 08/23/22 08/23/23  Vassie Loll, MD  carvedilol (COREG) 25 MG  tablet Take 25 mg by mouth 2 (two) times daily. 11/29/21   [provider]  Cholecalciferol 25 MCG (1000 UT) capsule Take 5,000 Units by mouth daily.    [provider]  diclofenac Sodium (VOLTAREN) 1 % GEL Apply 2 g topically 4 (four) times daily.    [provider]  ferrous sulfate 325 (65 FE) MG tablet Take 325 mg by mouth daily with breakfast.    [provider]  furosemide (LASIX) 20 MG tablet Take 40 mg by mouth daily.    [provider]  gabapentin (NEURONTIN) 300 MG capsule Take 1 capsule (300 mg total) by mouth daily. 01/23/23   Anabel Halon, MD  ketoconazole (NIZORAL) 2 % cream Apply 1 Application topically daily. 01/25/23   Anabel Halon, MD  NIFEdipine (PROCARDIA XL/NIFEDICAL XL) 60 MG 24 hr tablet Take 1 tablet (60 mg total) by mouth daily. 08/23/22 08/23/23  Vassie Loll, MD  omeprazole (PRILOSEC) 40 MG capsule Take 1 capsule (40 mg total) by mouth 2 (two) times daily. 08/23/22 10/22/22  Vassie Loll, MD  OVER THE COUNTER MEDICATION D 3 5,000 IU once per day. In addition to five 1,000 IU daily.    [provider]  sodium bicarbonate 650 MG tablet Take 650 mg by mouth 3 (three) times daily.    [provider]  tamsulosin (FLOMAX) 0.4 MG CAPS capsule Take 0.4 mg by mouth daily.  [provider]      Allergies    Patient has no known allergies.    Review of Systems   Review of Systems  Constitutional:  Positive for fatigue. Negative for fever.  Respiratory:  Negative for cough and shortness of breath.   Cardiovascular:  Negative for chest pain and syncope.  Gastrointestinal:  Negative for abdominal pain, diarrhea, nausea and vomiting.  Genitourinary:  Negative for dysuria.  Neurological:  Positive for weakness.    Physical Exam Updated Vital Signs BP 119/80   Pulse 72   Temp 97.8 F (36.6 C) (Oral)   Resp (!) 23   Ht  (1.778 m)   Wt 86.9 kg   SpO2 100%   BMI 27.49 kg/m  Physical  Exam Vitals and nursing note reviewed.  Constitutional:      General: He is not in acute distress.    Appearance: Normal appearance. He is well-developed.  HENT:     Head: Normocephalic and atraumatic.  Eyes:     Conjunctiva/sclera: Conjunctivae normal.  Cardiovascular:     Rate and Rhythm: Normal rate and regular rhythm.     Heart sounds: No murmur heard. Pulmonary:     Effort: Pulmonary effort is normal. No respiratory distress.     Breath sounds: Normal breath sounds.  Abdominal:     Palpations: Abdomen is soft.     Tenderness: There is no abdominal tenderness. There is no guarding or rebound.  Musculoskeletal:        General: No deformity. Normal range of motion.     Cervical back: Neck supple.  Skin:    General: Skin is warm and dry.     Capillary Refill: Capillary refill takes less than 2 seconds.  Neurological:     General: No focal deficit present.     Mental Status: He is alert.     Sensory: No sensory deficit.     Motor: No weakness.     ED Results / Procedures / Treatments   Labs (all labs ordered are listed, but only abnormal results are displayed) Labs Reviewed - No data to display  EKG None  Radiology No results found.  Procedures Procedures  {Document cardiac monitor, telemetry assessment procedure when appropriate:1}  Medications Ordered in ED Medications  sodium chloride 0.9 % bolus 500 mL (has no administration in time range)    ED Course/ Medical Decision Making/ A&P   {   Click here for ABCD2, HEART and other calculatorsREFRESH Note before signing :1}                          Medical Decision Making Amount and/or Complexity of Data Reviewed Labs: ordered.   This patient complains of ***; this involves an extensive number of treatment Options and is a complaint that carries with it a high risk of complications and morbidity. The differential includes ***  I ordered, reviewed and interpreted labs, which included *** I ordered  medication *** and reviewed PMP when indicated. I ordered imaging studies which included *** and I independently    visualized and interpreted imaging which showed *** Additional history obtained from *** Previous records obtained and reviewed *** I consulted *** and discussed lab and imaging findings and discussed disposition.  Cardiac monitoring reviewed, *** Social determinants considered, *** Critical Interventions: ***  After the interventions stated above, I reevaluated the patient and found *** Admission and further testing considered, ***   {Document critical care time  when appropriate:1} {Document review of labs and clinical decision tools ie heart score, Chads2Vasc2 etc:1}  {Document your independent review of radiology images, and any outside records:1} {Document your discussion with family members, caretakers, and with consultants:1} {Document social determinants of health affecting pt's care:1} {Document your decision making why or why not admission, treatments were needed:1} Final Clinical Impression(s) / ED Diagnoses Final diagnoses:  None    Rx / DC Orders ED Discharge Orders     None

## 2023-02-12 NOTE — Discharge Instructions (Signed)
You are seen in the emergency department for weakness low blood pressure at your kidney specialist office.  Your kidney function is worse and you are likely dehydrated.  Your symptoms improved with some fluids.  Please continue your regular medications and follow-up with your kidney doctor for repeat blood work.  Return to the emergency department if any worsening or concerning symptoms.

## 2023-02-12 NOTE — ED Triage Notes (Signed)
Kidney doctor sent pt over for hypotension, weakness and dizziness.

## 2023-02-12 NOTE — ED Notes (Signed)
Ambulated pt around room, pt walked with a steady gait. Denies any dizziness. VSS

## 2023-02-14 ENCOUNTER — Telehealth: Payer: Self-pay

## 2023-02-14 NOTE — Transitions of Care (Post Inpatient/ED Visit) (Signed)
   02/14/2023  Name: Charles [name] MRN: [id number] DOB: [birth year]  Today's TOC FU Call Status: Today's TOC FU Call Status:: Unsuccessul Call (1st Attempt) Unsuccessful Call (1st Attempt) Date: 02/14/23  Attempted to reach the patient regarding the most recent Inpatient/ED visit.  Follow Up Plan: Additional outreach attempts will be made to reach the patient to complete the Transitions of Care (Post Inpatient/ED visit) call.   Signature Elisha Ponder LPN [facility] AWV Team Direct dial:  [phone]

## 2023-02-28 ENCOUNTER — Encounter: Payer: Self-pay | Admitting: Vascular Surgery

## 2023-02-28 ENCOUNTER — Ambulatory Visit (INDEPENDENT_AMBULATORY_CARE_PROVIDER_SITE_OTHER): Payer: 59 | Admitting: Vascular Surgery

## 2023-02-28 VITALS — BP 175/97 | HR 73 | Temp 97.0°F | Ht 70.0 in | Wt 183.0 lb

## 2023-02-28 DIAGNOSIS — N184 Chronic kidney disease, stage 4 (severe): Secondary | ICD-10-CM | POA: Diagnosis not present

## 2023-02-28 NOTE — Progress Notes (Signed)
[facility] [place]  Patient name: Charles [name] MRN: [id number] DOB: [birth year] Sex: male  REASON FOR VISIT: Discuss access for hemodialysis  HPI: Charles [name] is a 67 y.o. 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  At my last office visit with him on 04/19/2022 he was found to have patent left arm radiocephalic fistula.  He has had progressive renal insufficiency and now has a creatinine of 6.7.  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  He is seen today for discussion of next access.  He has very poor understanding of his disease process.  He has been noncompliant with medications.  Past Medical History:  Diagnosis Date   Anemia    Anxiety    Chronic back pain    Dementia    Nursing facility feels he has Dementia   Diabetes mellitus    Facial numbness    Fracture of right foot    Hypertension    Mental disorder    schizophrenia   Renal insufficiency     Family History  Problem Relation Age of Onset   Diabetes Brother    Colon cancer Neg Hx    Liver disease Neg Hx        unknown for sure, mom died at age 72    SOCIAL HISTORY: Social History   Tobacco Use   Smoking status: Every Day    Packs/day: 0.25    Years: 40.00    Additional pack years: 0.00    Total pack years: 10.00    Types: Cigarettes   Smokeless tobacco: Never   Tobacco comments:    Smoke   Substance Use Topics   Alcohol use: Yes    Comment: every other day    No Known Allergies  Current Outpatient Medications  Medication Sig Dispense Refill   acetaminophen (TYLENOL 8 HOUR) 650 MG CR tablet Take 1 tablet (650 mg total) by mouth every 8 (eight) hours as needed for pain or fever. (Patient not taking: Reported on 02/28/2023) 30 tablet 0   aspirin EC 81 MG tablet Take 1 tablet (81 mg total) by mouth daily with breakfast. (Patient not taking: Reported on 02/28/2023)     calcitRIOL (ROCALTROL) 0.25 MCG capsule  Take 1 capsule (0.25 mcg total) by mouth daily. (Patient not taking: Reported on 02/28/2023) 30 capsule 11   carvedilol (COREG) 25 MG tablet Take 25 mg by mouth 2 (two) times daily. (Patient not taking: Reported on 02/28/2023)     Cholecalciferol 25 MCG (1000 UT) capsule Take 5,000 Units by mouth daily. (Patient not taking: Reported on 02/28/2023)     diclofenac Sodium (VOLTAREN) 1 % GEL Apply 2 g topically 4 (four) times daily. (Patient not taking: Reported on 02/28/2023)     ferrous sulfate 325 (65 FE) MG tablet Take 325 mg by mouth daily with breakfast. (Patient not taking: Reported on 02/28/2023)     furosemide (LASIX) 20 MG tablet Take 40 mg by mouth daily. (Patient not taking: Reported on 02/28/2023)     gabapentin (NEURONTIN) 300 MG capsule Take 1 capsule (300 mg total) by mouth daily. (Patient not taking: Reported on 02/28/2023) 30 capsule 2   ketoconazole (NIZORAL) 2 % cream Apply 1 Application topically daily. (Patient not taking: Reported on 02/28/2023) 15 g 0   NIFEdipine (PROCARDIA XL/NIFEDICAL XL) 60 MG 24 hr tablet Take 1 tablet (60 mg total) by mouth daily. (Patient not taking: Reported on 02/28/2023) 30 tablet  3   omeprazole (PRILOSEC) 40 MG capsule Take 1 capsule (40 mg total) by mouth 2 (two) times daily. (Patient not taking: Reported on 02/28/2023) 60 capsule 1   OVER THE COUNTER MEDICATION D 3 5,000 IU once per day. In addition to five 1,000 IU daily. (Patient not taking: Reported on 02/28/2023)     sodium bicarbonate 650 MG tablet Take 650 mg by mouth 3 (three) times daily. (Patient not taking: Reported on 02/28/2023)     tamsulosin (FLOMAX) 0.4 MG CAPS capsule Take 0.4 mg by mouth daily. (Patient not taking: Reported on 02/28/2023)     No current facility-administered medications for this visit.    REVIEW OF SYSTEMS:  [X]  denotes positive finding, [ ]  denotes negative finding Cardiac  Comments:  Chest pain or chest pressure:    Shortness of breath upon exertion:    Short of breath when lying flat:     Irregular heart rhythm:        Vascular    Pain in calf, thigh, or hip brought on by ambulation:    Pain in feet at night that wakes you up from your sleep:     Blood clot in your veins:    Leg swelling:           PHYSICAL EXAM: Vitals:   02/28/23 0915  BP: (!) 175/97  Pulse: 73  Temp: (!) 97 F (36.1 C)  SpO2: 98%  Weight: 183 lb (83 kg)  Height: 5\' 10"  (1.778 m)    GENERAL: The patient is a well-nourished male, in no acute distress. The vital signs are documented above. CARDIOVASCULAR: Left radiocephalic fistula thrombosed.  No thrill.  He does have a left 2+ radial pulse.  He has small surface veins in the right arm. PULMONARY: There is good air exchange  MUSCULOSKELETAL: There are no major deformities or cyanosis. NEUROLOGIC: No focal weakness or paresthesias are detected. SKIN: There are no ulcers or rashes noted. PSYCHIATRIC: The patient has a normal affect.  DATA:  I imaged his left arm with SonoSite.  This shows small upper arm cephalic and basilic veins.  MEDICAL ISSUES: I discussed options with the patient.  I feel that his next option would be left upper arm AV Gore-Tex graft.  He is right-handed.  We will check with Dr. Lucio Edward office regarding timing.  It seems as though he may be heading towards hemodialysis.  We would need 1 month for healing of his Gore-Tex graft prior to initiation of use.  If there is expected to be some time delay between now and the need for dialysis, we would defer graft placement to a later date.  I discussed this with the patient who understands.  We will proceed with left arm AV Gore-Tex graft after timing is discussed with Dr. Lovey Newcomer, MD FACS [facility] [phone]  Note: Portions of this report may have been transcribed using voice recognition software.  Every effort has been made to ensure accuracy; however, inadvertent computerized transcription errors may  still be present.

## 2023-02-28 NOTE — H&P (View-Only) (Signed)
  [facility] [place]  Patient name: Charles [name] MRN: [id number] DOB: 03/04/1956 Sex: male  REASON FOR VISIT: Discuss access for hemodialysis  HPI: Charles [name] is a 67 y.o. 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  At my last office visit with him on 04/19/2022 he was found to have patent left arm radiocephalic fistula.  He has had progressive renal insufficiency and now has a creatinine of 6.7.  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  He is seen today for discussion of next access.  He has very poor understanding of his disease process.  He has been noncompliant with medications.  Past Medical History:  Diagnosis Date   Anemia    Anxiety    Chronic back pain    Dementia    Nursing facility feels he has Dementia   Diabetes mellitus    Facial numbness    Fracture of right foot    Hypertension    Mental disorder    schizophrenia   Renal insufficiency     Family History  Problem Relation Age of Onset   Diabetes Brother    Colon cancer Neg Hx    Liver disease Neg Hx        unknown for sure, mom died at age 25    SOCIAL HISTORY: Social History   Tobacco Use   Smoking status: Every Day    Packs/day: 0.25    Years: 40.00    Additional pack years: 0.00    Total pack years: 10.00    Types: Cigarettes   Smokeless tobacco: Never   Tobacco comments:    Smoke   Substance Use Topics   Alcohol use: Yes    Comment: every other day    No Known Allergies  Current Outpatient Medications  Medication Sig Dispense Refill   acetaminophen (TYLENOL 8 HOUR) 650 MG CR tablet Take 1 tablet (650 mg total) by mouth every 8 (eight) hours as needed for pain or fever. (Patient not taking: Reported on 02/28/2023) 30 tablet 0   aspirin EC 81 MG tablet Take 1 tablet (81 mg total) by mouth daily with breakfast. (Patient not taking: Reported on 02/28/2023)     calcitRIOL (ROCALTROL) 0.25 MCG capsule  Take 1 capsule (0.25 mcg total) by mouth daily. (Patient not taking: Reported on 02/28/2023) 30 capsule 11   carvedilol (COREG) 25 MG tablet Take 25 mg by mouth 2 (two) times daily. (Patient not taking: Reported on 02/28/2023)     Cholecalciferol 25 MCG (1000 UT) capsule Take 5,000 Units by mouth daily. (Patient not taking: Reported on 02/28/2023)     diclofenac Sodium (VOLTAREN) 1 % GEL Apply 2 g topically 4 (four) times daily. (Patient not taking: Reported on 02/28/2023)     ferrous sulfate 325 (65 FE) MG tablet Take 325 mg by mouth daily with breakfast. (Patient not taking: Reported on 02/28/2023)     furosemide (LASIX) 20 MG tablet Take 40 mg by mouth daily. (Patient not taking: Reported on 02/28/2023)     gabapentin (NEURONTIN) 300 MG capsule Take 1 capsule (300 mg total) by mouth daily. (Patient not taking: Reported on 02/28/2023) 30 capsule 2   ketoconazole (NIZORAL) 2 % cream Apply 1 Application topically daily. (Patient not taking: Reported on 02/28/2023) 15 g 0   NIFEdipine (PROCARDIA XL/NIFEDICAL XL) 60 MG 24 hr tablet Take 1 tablet (60 mg total) by mouth daily. (Patient not taking: Reported on 02/28/2023) 30 tablet   3   omeprazole (PRILOSEC) 40 MG capsule Take 1 capsule (40 mg total) by mouth 2 (two) times daily. (Patient not taking: Reported on 02/28/2023) 60 capsule 1   OVER THE COUNTER MEDICATION D 3 5,000 IU once per day. In addition to five 1,000 IU daily. (Patient not taking: Reported on 02/28/2023)     sodium bicarbonate 650 MG tablet Take 650 mg by mouth 3 (three) times daily. (Patient not taking: Reported on 02/28/2023)     tamsulosin (FLOMAX) 0.4 MG CAPS capsule Take 0.4 mg by mouth daily. (Patient not taking: Reported on 02/28/2023)     No current facility-administered medications for this visit.    REVIEW OF SYSTEMS:  [X] denotes positive finding, [ ] denotes negative finding Cardiac  Comments:  Chest pain or chest pressure:    Shortness of breath upon exertion:    Short of breath when lying flat:     Irregular heart rhythm:        Vascular    Pain in calf, thigh, or hip brought on by ambulation:    Pain in feet at night that wakes you up from your sleep:     Blood clot in your veins:    Leg swelling:           PHYSICAL EXAM: Vitals:   02/28/23 0915  BP: (!) 175/97  Pulse: 73  Temp: (!) 97 F (36.1 C)  SpO2: 98%  Weight: 183 lb (83 kg)  Height: 5' 10" (1.778 m)    GENERAL: The patient is a well-nourished male, in no acute distress. The vital signs are documented above. CARDIOVASCULAR: Left radiocephalic fistula thrombosed.  No thrill.  He does have a left 2+ radial pulse.  He has small surface veins in the right arm. PULMONARY: There is good air exchange  MUSCULOSKELETAL: There are no major deformities or cyanosis. NEUROLOGIC: No focal weakness or paresthesias are detected. SKIN: There are no ulcers or rashes noted. PSYCHIATRIC: The patient has a normal affect.  DATA:  I imaged his left arm with SonoSite.  This shows small upper arm cephalic and basilic veins.  MEDICAL ISSUES: I discussed options with the patient.  I feel that his next option would be left upper arm AV Gore-Tex graft.  He is right-handed.  We will check with Dr. Bhutani's office regarding timing.  It seems as though he may be heading towards hemodialysis.  We would need 1 month for healing of his Gore-Tex graft prior to initiation of use.  If there is expected to be some time delay between now and the need for dialysis, we would defer graft placement to a later date.  I discussed this with the patient who understands.  We will proceed with left arm AV Gore-Tex graft after timing is discussed with Dr. Bhutani    Deneshia Zucker F. Yahsir Wickens, MD FACS [facility] [place] Office Tel [phone]  Note: Portions of this report may have been transcribed using voice recognition software.  Every effort has been made to ensure accuracy; however, inadvertent computerized transcription errors may  still be present. 

## 2023-03-02 ENCOUNTER — Other Ambulatory Visit: Payer: Self-pay

## 2023-03-02 DIAGNOSIS — N184 Chronic kidney disease, stage 4 (severe): Secondary | ICD-10-CM

## 2023-03-06 ENCOUNTER — Telehealth: Payer: Self-pay

## 2023-03-06 ENCOUNTER — Encounter: Payer: Self-pay | Admitting: Podiatry

## 2023-03-06 ENCOUNTER — Ambulatory Visit (INDEPENDENT_AMBULATORY_CARE_PROVIDER_SITE_OTHER): Payer: 59 | Admitting: Podiatry

## 2023-03-06 DIAGNOSIS — B351 Tinea unguium: Secondary | ICD-10-CM

## 2023-03-06 DIAGNOSIS — M2041 Other hammer toe(s) (acquired), right foot: Secondary | ICD-10-CM

## 2023-03-06 DIAGNOSIS — L84 Corns and callosities: Secondary | ICD-10-CM | POA: Diagnosis not present

## 2023-03-06 DIAGNOSIS — M79675 Pain in left toe(s): Secondary | ICD-10-CM

## 2023-03-06 DIAGNOSIS — E1142 Type 2 diabetes mellitus with diabetic polyneuropathy: Secondary | ICD-10-CM

## 2023-03-06 NOTE — Progress Notes (Signed)
This patient returns to my office for at risk foot care.  This patient requires this care by a professional since this patient will be at risk due to having diabetes.  He says the second toes both feet have become intensely painful.  He says he experiences pain on the tip of both second toes walking in shoes and during sleep.  He presents to the office for evaluation and treatment.   General Appearance  Alert, conversant and in no acute stress.  Vascular  Dorsalis pedis and posterior tibial  pulses are  weakly palpable  bilaterally.  Capillary return is within normal limits  bilaterally. Temperature is within normal limits  bilaterally.  Neurologic  Senn-Weinstein monofilament wire test within normal limits  bilaterally. Muscle power within normal limits bilaterally.  Nails Thick disfigured discolored nails with subungual debris  from hallux to fifth toes bilaterally. No evidence of bacterial infection or drainage bilaterally.  Orthopedic  No limitations of motion  feet .  No crepitus or effusions noted.  No bony pathology or digital deformities noted.HAV  B/L.  Hammer toes 2-5  B/L.  Skin  normotropic skin with no porokeratosis noted bilaterally.  No signs of infections or ulcers noted.   Callus over dorsomedial exostosis left foot. Clavi second toes  B/L  Callus/clavi second toe  B/L.  Hammer toes  second  B/L.  Consent was obtained for treatment procedures.  Patient says the toes are painful and would like to discuss surgery.  Discussed surgery and he will be referred to Dr.  Patel.  Debride with dremel too and padding dispensed.   Return office visit   3 months.                  Told patient to return for periodic foot care and evaluation due to potential at risk complications.   Charles [name]   

## 2023-03-06 NOTE — Telephone Encounter (Signed)
Spoke with Liborio Nixon at Dr. Lucio Edward office. She advised patient is scheduled to follow up in their office on 5/10 and request to leave patient on schedule for surgery on 5/14 for now because he nearing need for dialysis. Someone from Dr. Lucio Edward office will call after his appointment to confirm whether to proceed with surgery or postpone.

## 2023-03-07 ENCOUNTER — Ambulatory Visit: Payer: 59 | Admitting: Podiatry

## 2023-03-07 ENCOUNTER — Ambulatory Visit: Payer: 59 | Admitting: Vascular Surgery

## 2023-03-09 ENCOUNTER — Encounter (HOSPITAL_COMMUNITY): Payer: Self-pay | Admitting: Vascular Surgery

## 2023-03-09 ENCOUNTER — Encounter (HOSPITAL_COMMUNITY)
Admission: RE | Admit: 2023-03-09 | Discharge: 2023-03-09 | Disposition: A | Discharge status: Home / Self Care | Payer: 59 | Source: Ambulatory Visit | Attending: Vascular Surgery | Admitting: Vascular Surgery

## 2023-03-09 DIAGNOSIS — I120 Hypertensive chronic kidney disease with stage 5 chronic kidney disease or end stage renal disease: Secondary | ICD-10-CM

## 2023-03-12 NOTE — Telephone Encounter (Signed)
Spoke with Tiffany at Dr. Lucio Edward office. Stated patient was a no show on 5/10 but has an appt today at 430p. She reports that he supposed to get some updated lab work as well, but to her understanding, Dr. Wolfgang Phoenix wants to patient to have surgery regardless of lab results.

## 2023-03-13 ENCOUNTER — Encounter (HOSPITAL_COMMUNITY)
Admission: RE | Disposition: A | Discharge status: Home / Self Care | Payer: Self-pay | Source: Home / Self Care | Attending: Vascular Surgery

## 2023-03-13 ENCOUNTER — Ambulatory Visit (HOSPITAL_COMMUNITY)
Admission: RE | Admit: 2023-03-13 | Discharge: 2023-03-13 | Disposition: A | Discharge status: Home / Self Care | Payer: 59 | Attending: Vascular Surgery | Admitting: Vascular Surgery

## 2023-03-13 ENCOUNTER — Other Ambulatory Visit: Payer: Self-pay

## 2023-03-13 ENCOUNTER — Ambulatory Visit (HOSPITAL_BASED_OUTPATIENT_CLINIC_OR_DEPARTMENT_OTHER): Payer: 59 | Admitting: Anesthesiology

## 2023-03-13 ENCOUNTER — Ambulatory Visit (HOSPITAL_COMMUNITY): Payer: 59 | Admitting: Anesthesiology

## 2023-03-13 ENCOUNTER — Encounter (HOSPITAL_COMMUNITY): Payer: Self-pay | Admitting: Vascular Surgery

## 2023-03-13 DIAGNOSIS — I120 Hypertensive chronic kidney disease with stage 5 chronic kidney disease or end stage renal disease: Secondary | ICD-10-CM | POA: Diagnosis not present

## 2023-03-13 DIAGNOSIS — T82590A Other mechanical complication of surgically created arteriovenous fistula, initial encounter: Secondary | ICD-10-CM | POA: Diagnosis not present

## 2023-03-13 DIAGNOSIS — E1122 Type 2 diabetes mellitus with diabetic chronic kidney disease: Secondary | ICD-10-CM

## 2023-03-13 DIAGNOSIS — Y832 Surgical operation with anastomosis, bypass or graft as the cause of abnormal reaction of the patient, or of later complication, without mention of misadventure at the time of the procedure: Secondary | ICD-10-CM | POA: Diagnosis not present

## 2023-03-13 DIAGNOSIS — N185 Chronic kidney disease, stage 5: Secondary | ICD-10-CM | POA: Diagnosis not present

## 2023-03-13 DIAGNOSIS — D631 Anemia in chronic kidney disease: Secondary | ICD-10-CM | POA: Diagnosis not present

## 2023-03-13 DIAGNOSIS — D759 Disease of blood and blood-forming organs, unspecified: Secondary | ICD-10-CM | POA: Diagnosis not present

## 2023-03-13 DIAGNOSIS — F17210 Nicotine dependence, cigarettes, uncomplicated: Secondary | ICD-10-CM | POA: Insufficient documentation

## 2023-03-13 DIAGNOSIS — F0390 Unspecified dementia without behavioral disturbance: Secondary | ICD-10-CM | POA: Insufficient documentation

## 2023-03-13 DIAGNOSIS — N186 End stage renal disease: Secondary | ICD-10-CM | POA: Insufficient documentation

## 2023-03-13 DIAGNOSIS — D649 Anemia, unspecified: Secondary | ICD-10-CM | POA: Diagnosis not present

## 2023-03-13 HISTORY — PX: AV FISTULA PLACEMENT: SHX1204

## 2023-03-13 LAB — BASIC METABOLIC PANEL
Anion gap: 9 (ref 5–15)
BUN: 49 mg/dL — ABNORMAL HIGH (ref 8–23)
CO2: 17 mmol/L — ABNORMAL LOW (ref 22–32)
Calcium: 9.1 mg/dL (ref 8.9–10.3)
Chloride: 113 mmol/L — ABNORMAL HIGH (ref 98–111)
Creatinine, Ser: 5.98 mg/dL — ABNORMAL HIGH (ref 0.61–1.24)
GFR, Estimated: 10 mL/min — ABNORMAL LOW (ref >60)
Glucose, Bld: 74 mg/dL (ref 70–99)
Potassium: 4.5 mmol/L (ref 3.5–5.1)
Sodium: 139 mmol/L (ref 135–145)

## 2023-03-13 LAB — HEMOGLOBIN AND HEMATOCRIT, BLOOD
HCT: 31.3 % — ABNORMAL LOW (ref 39.0–52.0)
Hemoglobin: 9.7 g/dL — ABNORMAL LOW (ref 13.0–17.0)

## 2023-03-13 SURGERY — INSERTION OF ARTERIOVENOUS (AV) GORE-TEX GRAFT ARM
Anesthesia: General | Site: Arm Lower | Laterality: Left

## 2023-03-13 MED ORDER — OXYCODONE-ACETAMINOPHEN 5-325 MG PO TABS: 1.0000 | ORAL_TABLET | Freq: Four times a day (QID) | ORAL | 0 refills | Status: DC | PRN

## 2023-03-13 MED ORDER — HYDROMORPHONE HCL 1 MG/ML IJ SOLN
INTRAMUSCULAR | Status: AC
  Filled 2023-03-13: qty 0.5

## 2023-03-13 MED ORDER — SODIUM CHLORIDE 0.9 % IV SOLN: INTRAVENOUS | Status: DC

## 2023-03-13 MED ORDER — PROPOFOL 500 MG/50ML IV EMUL
INTRAVENOUS | Status: AC
  Filled 2023-03-13: qty 100

## 2023-03-13 MED ORDER — CHLORHEXIDINE GLUCONATE 0.12 % MT SOLN
15.0000 mL | Freq: Once | OROMUCOSAL | Status: AC
  Administered 2023-03-13: 15 mL via OROMUCOSAL

## 2023-03-13 MED ORDER — LIDOCAINE-EPINEPHRINE 0.5 %-1:200000 IJ SOLN
INTRAMUSCULAR | Status: AC
  Filled 2023-03-13: qty 50

## 2023-03-13 MED ORDER — HYDRALAZINE HCL 20 MG/ML IJ SOLN
INTRAMUSCULAR | Status: AC
  Filled 2023-03-13: qty 1

## 2023-03-13 MED ORDER — HYDROMORPHONE HCL 1 MG/ML IJ SOLN
0.2500 mg | INTRAMUSCULAR | Status: DC | PRN
  Administered 2023-03-13: 0.5 mg via INTRAVENOUS

## 2023-03-13 MED ORDER — HEPARIN 6000 UNIT IRRIGATION SOLUTION
Status: DC | PRN
  Administered 2023-03-13: 1

## 2023-03-13 MED ORDER — MIDAZOLAM HCL 2 MG/2ML IJ SOLN
INTRAMUSCULAR | Status: DC | PRN
  Administered 2023-03-13: .5 mg via INTRAVENOUS

## 2023-03-13 MED ORDER — GLYCOPYRROLATE PF 0.2 MG/ML IJ SOSY
PREFILLED_SYRINGE | INTRAMUSCULAR | Status: AC
  Filled 2023-03-13: qty 3

## 2023-03-13 MED ORDER — LIDOCAINE HCL (PF) 2 % IJ SOLN
INTRAMUSCULAR | Status: AC
  Filled 2023-03-13: qty 5

## 2023-03-13 MED ORDER — HEPARIN SODIUM (PORCINE) 1000 UNIT/ML IJ SOLN
INTRAMUSCULAR | Status: AC
  Filled 2023-03-13: qty 5

## 2023-03-13 MED ORDER — ORAL CARE MOUTH RINSE: 15.0000 mL | Freq: Once | OROMUCOSAL | Status: AC

## 2023-03-13 MED ORDER — PROPOFOL 10 MG/ML IV BOLUS
INTRAVENOUS | Status: AC
  Filled 2023-03-13: qty 20

## 2023-03-13 MED ORDER — PROPOFOL 500 MG/50ML IV EMUL
INTRAVENOUS | Status: DC | PRN
  Administered 2023-03-13: 25 ug/kg/min via INTRAVENOUS
  Administered 2023-03-13: 80 ug/kg/min via INTRAVENOUS

## 2023-03-13 MED ORDER — HYDRALAZINE HCL 20 MG/ML IJ SOLN
10.0000 mg | Freq: Once | INTRAMUSCULAR | Status: AC
  Administered 2023-03-13: 10 mg via INTRAVENOUS

## 2023-03-13 MED ORDER — CHLORHEXIDINE GLUCONATE 4 % EX SOLN: 60.0000 mL | Freq: Once | CUTANEOUS | Status: DC

## 2023-03-13 MED ORDER — CEFAZOLIN SODIUM-DEXTROSE 2-4 GM/100ML-% IV SOLN
2.0000 g | INTRAVENOUS | Status: AC
  Administered 2023-03-13: 2 g via INTRAVENOUS
  Filled 2023-03-13: qty 100

## 2023-03-13 MED ORDER — FENTANYL CITRATE (PF) 100 MCG/2ML IJ SOLN
INTRAMUSCULAR | Status: AC
  Filled 2023-03-13: qty 2

## 2023-03-13 MED ORDER — MIDAZOLAM HCL 2 MG/2ML IJ SOLN
INTRAMUSCULAR | Status: AC
  Filled 2023-03-13: qty 2

## 2023-03-13 MED ORDER — NEOSTIGMINE METHYLSULFATE 3 MG/3ML IV SOSY
PREFILLED_SYRINGE | INTRAVENOUS | Status: AC
  Filled 2023-03-13: qty 6

## 2023-03-13 MED ORDER — ONDANSETRON HCL 4 MG/2ML IJ SOLN: 4.0000 mg | Freq: Once | INTRAMUSCULAR | Status: DC | PRN

## 2023-03-13 MED ORDER — LIDOCAINE-EPINEPHRINE 0.5 %-1:200000 IJ SOLN
INTRAMUSCULAR | Status: DC | PRN
  Administered 2023-03-13: 13 mL

## 2023-03-13 MED ORDER — PROPOFOL 10 MG/ML IV BOLUS
INTRAVENOUS | Status: DC | PRN
  Administered 2023-03-13: 20 mg via INTRAVENOUS
  Administered 2023-03-13: 40 mg via INTRAVENOUS
  Administered 2023-03-13: 20 mg via INTRAVENOUS
  Administered 2023-03-13: 30 mg via INTRAVENOUS
  Administered 2023-03-13: 20 mg via INTRAVENOUS

## 2023-03-13 MED ORDER — HYDRALAZINE HCL 20 MG/ML IJ SOLN
INTRAMUSCULAR | Status: DC | PRN
  Administered 2023-03-13: 10 mg via INTRAVENOUS

## 2023-03-13 MED ORDER — 0.9 % SODIUM CHLORIDE (POUR BTL) OPTIME
TOPICAL | Status: DC | PRN
  Administered 2023-03-13: 1000 mL

## 2023-03-13 MED ORDER — FENTANYL CITRATE (PF) 100 MCG/2ML IJ SOLN
INTRAMUSCULAR | Status: DC | PRN
  Administered 2023-03-13 (×4): 25 ug via INTRAVENOUS

## 2023-03-13 SURGICAL SUPPLY — 44 items
ADH SKN CLS APL DERMABOND .7 (GAUZE/BANDAGES/DRESSINGS) ×1
ARMBAND PINK RESTRICT EXTREMIT (MISCELLANEOUS) ×1 IMPLANT
BAG HAMPER (MISCELLANEOUS) ×1 IMPLANT
BOOT SUTURE VASCULAR YLW (MISCELLANEOUS) ×1
CANNULA VESSEL 3MM 2 BLNT TIP (CANNULA) ×1 IMPLANT
CLAMP SUTURE YELLOW 5 PAIRS (MISCELLANEOUS) IMPLANT
CLIP LIGATING EXTRA MED SLVR (CLIP) ×1 IMPLANT
CLIP LIGATING EXTRA SM BLUE (MISCELLANEOUS) ×1 IMPLANT
COVER LIGHT HANDLE STERIS (MISCELLANEOUS) ×2 IMPLANT
COVER MAYO STAND XLG (MISCELLANEOUS) ×1 IMPLANT
DECANTER SPIKE VIAL GLASS SM (MISCELLANEOUS) ×1 IMPLANT
DERMABOND ADVANCED .7 DNX12 (GAUZE/BANDAGES/DRESSINGS) ×1 IMPLANT
DRAPE HALF SHEET 40X57 (DRAPES) IMPLANT
ELECT REM PT RETURN 9FT ADLT (ELECTROSURGICAL) ×1
ELECTRODE REM PT RTRN 9FT ADLT (ELECTROSURGICAL) ×1 IMPLANT
GAUZE SPONGE 4X4 12PLY STRL (GAUZE/BANDAGES/DRESSINGS) ×2 IMPLANT
GLOVE BIO SURGEON STRL SZ7 (GLOVE) IMPLANT
GLOVE BIOGEL PI IND STRL 7.0 (GLOVE) ×2 IMPLANT
GLOVE ECLIPSE 6.5 STRL STRAW (GLOVE) IMPLANT
GLOVE SURG MICRO LTX SZ7.5 (GLOVE) ×1 IMPLANT
GOWN STRL REUS W/TWL LRG LVL3 (GOWN DISPOSABLE) ×3 IMPLANT
GRAFT GORETEX STRT 4-7X45 (Vascular Products) IMPLANT
IV NS 500ML (IV SOLUTION) ×1
IV NS 500ML BAXH (IV SOLUTION) ×2 IMPLANT
KIT BLADEGUARD II DBL (SET/KITS/TRAYS/PACK) ×1 IMPLANT
KIT TURNOVER KIT A (KITS) ×1 IMPLANT
MANIFOLD NEPTUNE II (INSTRUMENTS) ×1 IMPLANT
MARKER SKIN DUAL TIP RULER LAB (MISCELLANEOUS) ×2 IMPLANT
NDL HYPO 18GX1.5 BLUNT FILL (NEEDLE) ×1 IMPLANT
NEEDLE HYPO 18GX1.5 BLUNT FILL (NEEDLE) ×1 IMPLANT
NS IRRIG 1000ML POUR BTL (IV SOLUTION) ×1 IMPLANT
PACK CV ACCESS (CUSTOM PROCEDURE TRAY) ×1 IMPLANT
PAD ARMBOARD 7.5X6 YLW CONV (MISCELLANEOUS) ×1 IMPLANT
SET BASIN LINEN APH (SET/KITS/TRAYS/PACK) ×1 IMPLANT
SOL PREP POV-IOD 4OZ 10% (MISCELLANEOUS) ×1 IMPLANT
SOL PREP PROV IODINE SCRUB 4OZ (MISCELLANEOUS) ×1 IMPLANT
SUT PROLENE 6 0 CC (SUTURE) ×1 IMPLANT
SUT SILK 2 0 SH (SUTURE) IMPLANT
SUT VIC AB 3-0 SH 27 (SUTURE) ×1
SUT VIC AB 3-0 SH 27X BRD (SUTURE) ×1 IMPLANT
SYR 10ML LL (SYRINGE) ×1 IMPLANT
SYR CONTROL 10ML LL (SYRINGE) ×1 IMPLANT
TAG SUTURE CLAMP YLW 5PR (MISCELLANEOUS) ×1
UNDERPAD 30X36 HEAVY ABSORB (UNDERPADS AND DIAPERS) ×1 IMPLANT

## 2023-03-13 NOTE — Discharge Instructions (Signed)
[facility]  Discharge Instructions  AV Fistula or Graft Surgery for Dialysis Access  Please refer to the following instructions for your post-procedure care. Your surgeon or physician assistant will discuss any changes with you.  Activity  You may drive the day following your surgery, if you are comfortable and no longer taking prescription pain medication. Resume full activity as the soreness in your incision resolves.  Bathing/Showering  You may shower after you go home. Keep your incision dry for 48 hours. Do not soak in a bathtub, hot tub, or swim until the incision heals completely. You may not shower if you have a hemodialysis catheter.  Incision Care  Clean your incision with mild soap and water after 48 hours. Pat the area dry with a clean towel. You do not need a bandage unless otherwise instructed. Do not apply any ointments or creams to your incision. You may have skin glue on your incision. Do not peel it off. It will come off on its own in about one week. Your arm may swell a bit after surgery. To reduce swelling use pillows to elevate your arm so it is above your heart. Your doctor will tell you if you need to lightly wrap your arm with an ACE bandage.  Diet  Resume your normal diet. There are not special food restrictions following this procedure. In order to heal from your surgery, it is CRITICAL to get adequate nutrition. Your body requires vitamins, minerals, and protein. Vegetables are the best source of vitamins and minerals. Vegetables also provide the perfect balance of protein. Processed food has little nutritional value, so try to avoid this.  Medications  Resume taking all of your medications. If your incision is causing pain, you may take over-the counter pain relievers such as acetaminophen (Tylenol). If you were prescribed a stronger pain medication, please be aware these medications can cause nausea and constipation. Prevent  nausea by taking the medication with a snack or meal. Avoid constipation by drinking plenty of fluids and eating foods with high amount of fiber, such as fruits, vegetables, and grains.  Do not take Tylenol if you are taking prescription pain medications.  Follow up Your surgeon may want to see you in the office following your access surgery. If so, this will be arranged at the time of your surgery.  Please call us immediately for any of the following conditions:  Increased pain, redness, drainage (pus) from your incision site Fever of 101 degrees or higher Severe or worsening pain at your incision site Hand pain or numbness.  Reduce your risk of vascular disease:  Stop smoking. If you would like help, call [facility] at 1-800-QUIT-NOW (8678810262) or [facility] at [phone]  Manage your cholesterol Maintain a desired weight Control your diabetes Keep your blood pressure down  Dialysis  It will take several weeks to several months for your new dialysis access to be ready for use. Your surgeon will determine when it is okay to use it. Your nephrologist will continue to direct your dialysis. You can continue to use your Permcath until your new access is ready for use.   03/13/2023 DERYAN HERIN [id number] 02/07/1956  Surgeon(s): Daveigh Batty, Kristen Loader, MD  Procedure(s): INSERTION OF LEFT ARM ARTERIOVENOUS (AV) GORE-TEX GRAFT   May stick graft immediately   May stick graft on designated area only:    Do not stick graft for 4 weeks    If you have any questions, please call the office  at [phone].

## 2023-03-13 NOTE — Interval H&P Note (Signed)
History and Physical Interval Note:  03/13/2023 10:12 AM  [facility]  has presented today for surgery, with the diagnosis of CKD IV.  The various methods of treatment have been discussed with the patient and family. After consideration of risks, benefits and other options for treatment, the patient has consented to  Procedure(s): INSERTION OF LEFT ARM ARTERIOVENOUS (AV) GORE-TEX GRAFT (Left) as a surgical intervention.  The patient's history has been reviewed, patient examined, no change in status, stable for surgery.  I have reviewed the patient's chart and labs.  Questions were answered to the patient's satisfaction.     Charles [name]

## 2023-03-13 NOTE — Transfer of Care (Signed)
Immediate Anesthesia Transfer of Care Note  Patient: Charles [name]  Procedure(s) Performed: INSERTION OF LEFT ARM ARTERIOVENOUS (AV) GORE-TEX GRAFT (Left: Arm Lower)  Patient Location: Short Stay  Anesthesia Type:General  Level of Consciousness: awake, alert , and oriented  Airway & Oxygen Therapy: Patient Spontanous Breathing  Post-op Assessment: Report given to RN and Post -op Vital signs reviewed and stable  Post vital signs: Reviewed and stable  Last Vitals:  Vitals Value Taken Time  BP    Temp    Pulse    Resp    SpO2      Last Pain:  Vitals:   03/13/23 0941  PainSc: 0-No pain         Complications: No notable events documented.

## 2023-03-13 NOTE — Anesthesia Preprocedure Evaluation (Signed)
Anesthesia Evaluation  Patient identified by MRN, date of birth, ID band Patient awake    Reviewed: Allergy & Precautions, H&P , NPO status , Patient's Chart, lab work & pertinent test results, reviewed documented beta blocker date and time   Airway Mallampati: I  TM Distance: >3 FB Neck ROM: Full    Dental  (+) Dental Advisory Given, Chipped, Loose, Poor Dentition, Missing,    Pulmonary Current Smoker and Patient abstained from smoking.   Pulmonary exam normal breath sounds clear to auscultation       Cardiovascular Exercise Tolerance: Good hypertension (episodes of dizziness and hypotension and he stopped taking all his blood pressure medications week ago), Pt. on medications and Pt. on home beta blockers Normal cardiovascular exam Rhythm:Regular Rate:Normal     Neuro/Psych  PSYCHIATRIC DISORDERS Anxiety    Dementia  Neuromuscular disease    GI/Hepatic Neg liver ROS,GERD  Controlled,,  Endo/Other  diabetes, Well Controlled, Type 2    Renal/GU ESRFRenal disease  negative genitourinary   Musculoskeletal negative musculoskeletal ROS (+)    Abdominal   Peds negative pediatric ROS (+)  Hematology  (+) Blood dyscrasia, anemia   Anesthesia Other Findings   Reproductive/Obstetrics negative OB ROS                             Anesthesia Physical Anesthesia Plan  ASA: 4  Anesthesia Plan: General   Post-op Pain Management: Dilaudid IV   Induction: Intravenous  PONV Risk Score and Plan: 2 and Ondansetron and Dexamethasone  Airway Management Planned: LMA, Nasal Cannula, Natural Airway and Simple Face Mask  Additional Equipment:   Intra-op Plan:   Post-operative Plan:   Informed Consent: I have reviewed the patients History and Physical, chart, labs and discussed the procedure including the risks, benefits and alternatives for the proposed anesthesia with the patient or authorized  representative who has indicated his/her understanding and acceptance.     Dental advisory given  Plan Discussed with: CRNA and Surgeon  Anesthesia Plan Comments: (episodes of dizziness and hypotension and he stopped taking all his blood pressure medications week ago, blood pressure today, last BP was 205/92 and pulse 59will give hydralazine 10 mg iv in pre op and reevaluate  before taking him ot OR)        Anesthesia Quick Evaluation

## 2023-03-13 NOTE — Op Note (Signed)
    OPERATIVE REPORT  DATE OF SURGERY: 03/13/2023  PATIENT: Charles [name], 67 y.o. male MRN: [id number]  DOB: 01/16/1956  PRE-OPERATIVE DIAGNOSIS: End-stage renal disease  POST-OPERATIVE DIAGNOSIS:  Same  PROCEDURE: Left forearm loop AV Gore-Tex graft  SURGEON:  Gretta Began, M.D.  PHYSICIAN ASSISTANT: Nurse  The assistant was needed for exposure and to expedite the case  ANESTHESIA: Local with sedation  EBL: per anesthesia record  Total I/O In: 400 [I.V.:300; IV Piggyback:100] Out: 10 [Blood:10]  BLOOD ADMINISTERED: none  DRAINS: none  SPECIMEN: none  COUNTS CORRECT:  YES  PATIENT DISPOSITION:  PACU - hemodynamically stable  PROCEDURE DETAILS: Patient was taken operating placed supine position where the area of the left arm left axilla were prepped draped you sterile fashion.  Incision was made at the level of the brachial artery at the antecubital space and the artery was exposed.  The patient had a large caliber artery.  There was moderate atherosclerotic change in the brachial artery.  The patient had a large vein at the same location in the brachial vein.  Decision was made to place a forearm loop graft.  Separate incision was made over the distal forearm and a loop configuration tunnel was created.  A 4 x 7 mm tapered graft was brought through the tunnel.  The arterial limb of the graft was on the medial lower aspect of the forearm and the venous on the radial aspect.  The artery was occluded proximally distally and was opened with an 11 blade and sent longitudinally with Potts scissors.  A small arteriotomy was created to reduce risk of steal.  The 4 mm portion of the graft was spatulated at approximately the 5 mm level and was sewn into side of the artery with a running 6-0 Prolene suture.  This anastomosis was tested and found to be adequate.  The graft was flushed with heparinized saline and reoccluded.  Next the vein was occluded proximally and distally and was opened  with an 11 blade and sent longitudinally with Potts scissors.  The graft was spatulated and sewn end-to-side to the vein with a running 6-0 Prolene suture.  After removed and excellent thrill was noted.  The patient did maintain a left radial pulse.  Wounds irrigated with saline.  Hemostasis was obtained with electrocautery.  The wound was closed with 3-0 Vicryl in the subcutaneous and subcuticular tissue.  Sterile dressing was applied and the patient was transferred to the recovery room in stable condition   Larina Earthly, M.D., [facility] 03/13/2023 12:34 PM  Note: Portions of this report may have been transcribed using voice recognition software.  Every effort has been made to ensure accuracy; however, inadvertent computerized transcription errors may still be present.

## 2023-03-13 NOTE — Anesthesia Postprocedure Evaluation (Signed)
Anesthesia Post Note  Patient: Charles [name]  Procedure(s) Performed: INSERTION OF LEFT ARM ARTERIOVENOUS (AV) GORE-TEX GRAFT (Left: Arm Lower)  Patient location during evaluation: Phase II Anesthesia Type: General Level of consciousness: awake and alert and oriented Pain management: pain level controlled Vital Signs Assessment: post-procedure vital signs reviewed and stable Respiratory status: spontaneous breathing, nonlabored ventilation and respiratory function stable Cardiovascular status: blood pressure returned to baseline and stable Postop Assessment: no apparent nausea or vomiting Anesthetic complications: no  No notable events documented.   Last Vitals:  Vitals:   03/13/23 1015 03/13/23 1243  BP:  137/77  Pulse: 62 81  Resp: 13 16  Temp:  36.7 C  SpO2: 100% 100%    Last Pain:  Vitals:   03/13/23 1252  TempSrc:   PainSc: 6                  Derreck Wiltsey C Olla Delancey

## 2023-03-14 ENCOUNTER — Other Ambulatory Visit (HOSPITAL_COMMUNITY)
Admission: RE | Admit: 2023-03-14 | Discharge: 2023-03-14 | Disposition: A | Discharge status: Home / Self Care | Payer: 59 | Source: Ambulatory Visit | Attending: Nephrology | Admitting: Nephrology

## 2023-03-14 DIAGNOSIS — R809 Proteinuria, unspecified: Secondary | ICD-10-CM | POA: Diagnosis not present

## 2023-03-14 DIAGNOSIS — N185 Chronic kidney disease, stage 5: Secondary | ICD-10-CM | POA: Insufficient documentation

## 2023-03-14 DIAGNOSIS — I5032 Chronic diastolic (congestive) heart failure: Secondary | ICD-10-CM | POA: Insufficient documentation

## 2023-03-14 DIAGNOSIS — E875 Hyperkalemia: Secondary | ICD-10-CM | POA: Diagnosis not present

## 2023-03-14 LAB — RENAL FUNCTION PANEL
Albumin: 4.1 g/dL (ref 3.5–5.0)
Anion gap: 8 (ref 5–15)
BUN: 44 mg/dL — ABNORMAL HIGH (ref 8–23)
CO2: 17 mmol/L — ABNORMAL LOW (ref 22–32)
Calcium: 9.4 mg/dL (ref 8.9–10.3)
Chloride: 113 mmol/L — ABNORMAL HIGH (ref 98–111)
Creatinine, Ser: 6.1 mg/dL — ABNORMAL HIGH (ref 0.61–1.24)
GFR, Estimated: 10 mL/min — ABNORMAL LOW (ref >60)
Glucose, Bld: 90 mg/dL (ref 70–99)
Phosphorus: 4.4 mg/dL (ref 2.5–4.6)
Potassium: 4.4 mmol/L (ref 3.5–5.1)
Sodium: 138 mmol/L (ref 135–145)

## 2023-03-14 LAB — CBC WITH DIFFERENTIAL/PLATELET
Abs Immature Granulocytes: 0.01 10*3/uL (ref 0.00–0.07)
Basophils Absolute: 0 10*3/uL (ref 0.0–0.1)
Basophils Relative: 0 %
Eosinophils Absolute: 0.1 10*3/uL (ref 0.0–0.5)
Eosinophils Relative: 2 %
HCT: 31.1 % — ABNORMAL LOW (ref 39.0–52.0)
Hemoglobin: 9.7 g/dL — ABNORMAL LOW (ref 13.0–17.0)
Immature Granulocytes: 0 %
Lymphocytes Relative: 29 %
Lymphs Abs: 1.7 10*3/uL (ref 0.7–4.0)
MCH: 30.7 pg (ref 26.0–34.0)
MCHC: 31.2 g/dL (ref 30.0–36.0)
MCV: 98.4 fL (ref 80.0–100.0)
Monocytes Absolute: 0.5 10*3/uL (ref 0.1–1.0)
Monocytes Relative: 8 %
Neutro Abs: 3.6 10*3/uL (ref 1.7–7.7)
Neutrophils Relative %: 61 %
Platelets: 206 10*3/uL (ref 150–400)
RBC: 3.16 MIL/uL — ABNORMAL LOW (ref 4.22–5.81)
RDW: 15.1 % (ref 11.5–15.5)
WBC: 5.9 10*3/uL (ref 4.0–10.5)
nRBC: 0 % (ref 0.0–0.2)

## 2023-03-14 LAB — PROTEIN / CREATININE RATIO, URINE
Creatinine, Urine: 171 mg/dL
Protein Creatinine Ratio: 1.82 mg/mg{Cre} — ABNORMAL HIGH (ref 0.00–0.15)
Total Protein, Urine: 311 mg/dL

## 2023-03-14 LAB — IRON AND TIBC
Iron: 38 ug/dL — ABNORMAL LOW (ref 45–182)
Saturation Ratios: 13 % — ABNORMAL LOW (ref 17.9–39.5)
TIBC: 299 ug/dL (ref 250–450)
UIBC: 261 ug/dL

## 2023-03-14 LAB — FERRITIN: Ferritin: 217 ng/mL (ref 24–336)

## 2023-03-15 LAB — HCV RT-PCR, QUANT (NON-GRAPH): Hepatitis C Quantitation: NOT DETECTED IU/mL

## 2023-03-15 LAB — HEPATITIS PANEL, ACUTE

## 2023-03-16 LAB — HEPATITIS PANEL, ACUTE: Hep A IgM: NEGATIVE — AB

## 2023-03-16 LAB — PTH, INTACT AND CALCIUM
Calcium, Total (PTH): 9.5 mg/dL (ref 8.6–10.2)
PTH: 215 pg/mL — ABNORMAL HIGH (ref 15–65)

## 2023-03-17 LAB — QUANTIFERON-TB GOLD PLUS (RQFGPL)
QuantiFERON Mitogen Value: >10 IU/mL
QuantiFERON Nil Value: 0.03 IU/mL
QuantiFERON TB1 Ag Value: 0.02 IU/mL
QuantiFERON TB2 Ag Value: 0.04 IU/mL

## 2023-03-17 LAB — QUANTIFERON-TB GOLD PLUS: QuantiFERON-TB Gold Plus: NEGATIVE

## 2023-03-19 ENCOUNTER — Encounter (HOSPITAL_COMMUNITY): Payer: Self-pay | Admitting: Vascular Surgery

## 2023-03-28 ENCOUNTER — Ambulatory Visit: Payer: 59 | Admitting: Podiatry

## 2023-04-12 ENCOUNTER — Other Ambulatory Visit (HOSPITAL_COMMUNITY)
Admission: RE | Admit: 2023-04-12 | Discharge: 2023-04-12 | Disposition: A | Discharge status: Home / Self Care | Payer: 59 | Source: Ambulatory Visit | Attending: Nephrology | Admitting: Nephrology

## 2023-04-12 ENCOUNTER — Other Ambulatory Visit (HOSPITAL_COMMUNITY): Payer: Self-pay | Admitting: Nephrology

## 2023-04-12 DIAGNOSIS — D649 Anemia, unspecified: Secondary | ICD-10-CM | POA: Diagnosis not present

## 2023-04-12 DIAGNOSIS — N185 Chronic kidney disease, stage 5: Secondary | ICD-10-CM | POA: Diagnosis not present

## 2023-04-12 DIAGNOSIS — N2581 Secondary hyperparathyroidism of renal origin: Secondary | ICD-10-CM | POA: Insufficient documentation

## 2023-04-12 DIAGNOSIS — N269 Renal sclerosis, unspecified: Secondary | ICD-10-CM | POA: Diagnosis not present

## 2023-04-12 DIAGNOSIS — R609 Edema, unspecified: Secondary | ICD-10-CM

## 2023-04-12 DIAGNOSIS — R808 Other proteinuria: Secondary | ICD-10-CM | POA: Diagnosis not present

## 2023-04-12 LAB — IRON AND TIBC
Iron: 45 ug/dL (ref 45–182)
Saturation Ratios: 17 % — ABNORMAL LOW (ref 17.9–39.5)
TIBC: 271 ug/dL (ref 250–450)
UIBC: 226 ug/dL

## 2023-04-12 LAB — HEPATITIS B SURFACE ANTIGEN: Hepatitis B Surface Ag: NONREACTIVE

## 2023-04-12 LAB — RENAL FUNCTION PANEL
Albumin: 3.6 g/dL (ref 3.5–5.0)
Anion gap: 8 (ref 5–15)
BUN: 52 mg/dL — ABNORMAL HIGH (ref 8–23)
CO2: 18 mmol/L — ABNORMAL LOW (ref 22–32)
Calcium: 8.3 mg/dL — ABNORMAL LOW (ref 8.9–10.3)
Chloride: 113 mmol/L — ABNORMAL HIGH (ref 98–111)
Creatinine, Ser: 5.9 mg/dL — ABNORMAL HIGH (ref 0.61–1.24)
GFR, Estimated: 10 mL/min — ABNORMAL LOW (ref >60)
Glucose, Bld: 142 mg/dL — ABNORMAL HIGH (ref 70–99)
Phosphorus: 4.5 mg/dL (ref 2.5–4.6)
Potassium: 4.5 mmol/L (ref 3.5–5.1)
Sodium: 139 mmol/L (ref 135–145)

## 2023-04-12 LAB — HEPATITIS B CORE ANTIBODY, TOTAL: Hep B Core Total Ab: REACTIVE — AB

## 2023-04-12 LAB — HEPATITIS B SURFACE ANTIBODY,QUALITATIVE: Hep B S Ab: REACTIVE — AB

## 2023-04-12 LAB — FERRITIN: Ferritin: 198 ng/mL (ref 24–336)

## 2023-04-12 LAB — HEPATITIS C ANTIBODY: HCV Ab: REACTIVE — AB

## 2023-04-13 ENCOUNTER — Ambulatory Visit (HOSPITAL_COMMUNITY)
Admission: RE | Admit: 2023-04-13 | Discharge: 2023-04-13 | Disposition: A | Discharge status: Home / Self Care | Payer: 59 | Source: Ambulatory Visit | Attending: Nephrology | Admitting: Nephrology

## 2023-04-13 DIAGNOSIS — R609 Edema, unspecified: Secondary | ICD-10-CM | POA: Diagnosis not present

## 2023-04-13 DIAGNOSIS — R600 Localized edema: Secondary | ICD-10-CM | POA: Diagnosis not present

## 2023-04-17 LAB — QUANTIFERON-TB GOLD PLUS (RQFGPL)
QuantiFERON Mitogen Value: >10 IU/mL
QuantiFERON Nil Value: 0 IU/mL
QuantiFERON TB1 Ag Value: 0 IU/mL
QuantiFERON TB2 Ag Value: 0.01 IU/mL

## 2023-04-17 LAB — QUANTIFERON-TB GOLD PLUS: QuantiFERON-TB Gold Plus: NEGATIVE

## 2023-04-18 ENCOUNTER — Ambulatory Visit (INDEPENDENT_AMBULATORY_CARE_PROVIDER_SITE_OTHER): Payer: 59 | Admitting: Podiatry

## 2023-04-18 ENCOUNTER — Ambulatory Visit (INDEPENDENT_AMBULATORY_CARE_PROVIDER_SITE_OTHER): Payer: 59

## 2023-04-18 DIAGNOSIS — M79672 Pain in left foot: Secondary | ICD-10-CM | POA: Diagnosis not present

## 2023-04-18 DIAGNOSIS — M2041 Other hammer toe(s) (acquired), right foot: Secondary | ICD-10-CM

## 2023-04-18 DIAGNOSIS — Z01818 Encounter for other preprocedural examination: Secondary | ICD-10-CM | POA: Diagnosis not present

## 2023-04-18 DIAGNOSIS — M79671 Pain in right foot: Secondary | ICD-10-CM

## 2023-04-18 DIAGNOSIS — M2042 Other hammer toe(s) (acquired), left foot: Secondary | ICD-10-CM | POA: Diagnosis not present

## 2023-04-18 NOTE — Progress Notes (Signed)
Subjective:  Patient ID: Charles [name], male    DOB: Mar 23, 1956,  MRN: [id number]  Chief Complaint  Patient presents with   Consult    Surgical consult     67 y.o. male presents with the above complaint.  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  Review of Systems: Negative except as noted in the HPI. Denies N/V/F/Ch.  Past Medical History:  Diagnosis Date   Anemia    Anxiety    Chronic back pain    Dementia    Nursing facility feels he has Dementia   Diabetes mellitus    Facial numbness    Fracture of right foot    Hypertension    Mental disorder    schizophrenia   Renal insufficiency     Current Outpatient Medications:    acetaminophen (TYLENOL 8 HOUR) 650 MG CR tablet, Take 1 tablet (650 mg total) by mouth every 8 (eight) hours as needed for pain or fever. (Patient not taking: Reported on 02/28/2023), Disp: 30 tablet, Rfl: 0   aspirin EC 81 MG tablet, Take 1 tablet (81 mg total) by mouth daily with breakfast. (Patient not taking: Reported on 02/28/2023), Disp: , Rfl:    calcitRIOL (ROCALTROL) 0.25 MCG capsule, Take 1 capsule (0.25 mcg total) by mouth daily. (Patient not taking: Reported on 02/28/2023), Disp: 30 capsule, Rfl: 11   carvedilol (COREG) 25 MG tablet, Take 25 mg by mouth 2 (two) times daily. (Patient not taking: Reported on 02/28/2023), Disp: , Rfl:    Cholecalciferol 25 MCG (1000 UT) capsule, Take 5,000 Units by mouth daily. (Patient not taking: Reported on 02/28/2023), Disp: , Rfl:    diclofenac Sodium (VOLTAREN) 1 % GEL, Apply 2 g topically 4 (four) times daily. (Patient not taking: Reported on 02/28/2023), Disp: , Rfl:    ferrous sulfate 325 (65 FE) MG tablet, Take 325 mg by mouth daily with breakfast. (Patient not taking: Reported on 02/28/2023), Disp: , Rfl:    furosemide (LASIX) 20  MG tablet, Take 40 mg by mouth daily. (Patient not taking: Reported on 02/28/2023), Disp: , Rfl:    gabapentin (NEURONTIN) 300 MG capsule, Take 1 capsule (300 mg total) by mouth daily. (Patient not taking: Reported on 02/28/2023), Disp: 30 capsule, Rfl: 2   ketoconazole (NIZORAL) 2 % cream, Apply 1 Application topically daily. (Patient not taking: Reported on 02/28/2023), Disp: 15 g, Rfl: 0   NIFEdipine (PROCARDIA XL/NIFEDICAL XL) 60 MG 24 hr tablet, Take 1 tablet (60 mg total) by mouth daily. (Patient not taking: Reported on 02/28/2023), Disp: 30 tablet, Rfl: 3   omeprazole (PRILOSEC) 40 MG capsule, Take 1 capsule (40 mg total) by mouth 2 (two) times daily. (Patient not taking: Reported on 02/28/2023), Disp: 60 capsule, Rfl: 1   OVER THE COUNTER MEDICATION, D 3 5,000 IU once per day. In addition to five 1,000 IU daily. (Patient not taking: Reported on 02/28/2023), Disp: , Rfl:    oxyCODONE-acetaminophen (PERCOCET) 5-325 MG tablet, Take 1 tablet by mouth every 6 (six) hours as needed for severe pain., Disp: 8 tablet, Rfl: 0   sodium bicarbonate 650 MG tablet, Take 650 mg by mouth 3 (three) times daily. (Patient not taking: Reported on 02/28/2023), Disp: , Rfl:    tamsulosin (FLOMAX) 0.4 MG CAPS capsule, Take 0.4 mg by mouth daily. (Patient not taking: Reported on  02/28/2023), Disp: , Rfl:   Social History   Tobacco Use  Smoking Status Every Day   Packs/day: 0.25   Years: 40.00   Additional pack years: 0.00   Total pack years: 10.00   Types: Cigarettes  Smokeless Tobacco Never  Tobacco Comments   Smoke     No Known Allergies Objective:  There were no vitals filed for this visit. There is no height or weight on file to calculate BMI. Constitutional Well developed. Well nourished.  Vascular Dorsalis pedis pulses palpable bilaterally. Posterior tibial pulses palpable bilaterally. Capillary refill normal to all digits.  No cyanosis or clubbing noted. Pedal hair growth normal.  Neurologic Normal  speech. Oriented to person, place, and time. Epicritic sensation to light touch grossly present bilaterally.  Dermatologic Nails well groomed and normal in appearance. No open wounds. No skin lesions.  Orthopedic: Bilateral second digit hammertoe contracture noted rigid in nature.  Distal tip hyperkeratotic lesion noted.  Pain on palpation to the hammertoe   Radiographs: 3 views of skeletally mature the bilateral foot: Hammertoe contracture noted to bilateral second digit. Assessment:   1. Pain in both feet   2. Encounter for preoperative examination for general surgical procedure   3. Hammertoe of second toe of right foot   4. Hammertoe of second toe of left foot    Plan:  Patient was evaluated and treated and all questions answered.  Bilateral second digit hammertoe contracture with underlying metatarsophalangeal joint contracture to the second metatarsophalangeal joint -All questions and concerns were discussed with the patient in extensive detail -Clinically patient has failed conservative care including shoe gear modification padding protecting offloading at this time I discussed my surgical planning with second digit repair with capsulotomy of the second metatarsophalangeal joint bilaterally.  I will plan on doing this bilateral procedure.  Patient would like to do both at the same time. -Informed surgical risk consent was reviewed and read aloud to the patient.  I reviewed the films.  I have discussed my findings with the patient in great detail.  I have discussed all risks including but not limited to infection, stiffness, scarring, limp, disability, deformity, damage to blood vessels and nerves, numbness, poor healing, need for braces, arthritis, chronic pain, amputation, death.  All benefits and realistic expectations discussed in great detail.  I have made no promises as to the outcome.  I have provided realistic expectations.  I have offered the patient a 2nd opinion, which they  have declined and assured me they preferred to proceed despite the risks   No follow-ups on file.

## 2023-04-20 ENCOUNTER — Telehealth: Payer: Self-pay | Admitting: Urology

## 2023-04-20 NOTE — Telephone Encounter (Signed)
DOS - 05/14/23  CAPSULOTOMY MPJ RELEASE 2ND BILAT --- 16109 HAMMERTOE REPAIR 2ND BILAT --- [id number]  [employer] EFFECTIVE DATE - 03/31/23  DEDUCTIBLE - $240.00 W/ $0.00 REMAINING OOP - $8,850.00 W/ $7,068.58 REMAINING COINSURANCE - 20%  PER [employer] WEBSITE FOR CPT CODES 09811 AND [phone] Notification or Prior Authorization is not required for the requested services You are not required to submit a notification/prior authorization based on the information provided. If you have general questions about the prior authorization requirements, visit [email or web address] > Clinician Resources > Advance and Admission Notification Requirements. The number above acknowledges your notification. Please write this reference number down for future reference. If you would like to request an organization determination, please call us at [phone].   Decision ID #: [id number]

## 2023-04-25 ENCOUNTER — Encounter: Payer: 59 | Admitting: Vascular Surgery

## 2023-05-10 ENCOUNTER — Other Ambulatory Visit (HOSPITAL_COMMUNITY)
Admission: RE | Admit: 2023-05-10 | Discharge: 2023-05-10 | Disposition: A | Discharge status: Home / Self Care | Payer: 59 | Source: Ambulatory Visit | Attending: Nephrology | Admitting: Nephrology

## 2023-05-10 DIAGNOSIS — D509 Iron deficiency anemia, unspecified: Secondary | ICD-10-CM | POA: Diagnosis not present

## 2023-05-10 LAB — RENAL FUNCTION PANEL
Albumin: 3.8 g/dL (ref 3.5–5.0)
Anion gap: 6 (ref 5–15)
BUN: 36 mg/dL — ABNORMAL HIGH (ref 8–23)
CO2: 18 mmol/L — ABNORMAL LOW (ref 22–32)
Calcium: 8.7 mg/dL — ABNORMAL LOW (ref 8.9–10.3)
Chloride: 117 mmol/L — ABNORMAL HIGH (ref 98–111)
Creatinine, Ser: 6.31 mg/dL — ABNORMAL HIGH (ref 0.61–1.24)
GFR, Estimated: 9 mL/min — ABNORMAL LOW (ref >60)
Glucose, Bld: 189 mg/dL — ABNORMAL HIGH (ref 70–99)
Phosphorus: 3.5 mg/dL (ref 2.5–4.6)
Potassium: 4.1 mmol/L (ref 3.5–5.1)
Sodium: 141 mmol/L (ref 135–145)

## 2023-05-10 LAB — IRON AND TIBC
Iron: 69 ug/dL (ref 45–182)
Saturation Ratios: 24 % (ref 17.9–39.5)
TIBC: 284 ug/dL (ref 250–450)
UIBC: 215 ug/dL

## 2023-05-10 LAB — CBC
HCT: 25.1 % — ABNORMAL LOW (ref 39.0–52.0)
Hemoglobin: 7.6 g/dL — ABNORMAL LOW (ref 13.0–17.0)
MCH: 30.6 pg (ref 26.0–34.0)
MCHC: 30.3 g/dL (ref 30.0–36.0)
MCV: 101.2 fL — ABNORMAL HIGH (ref 80.0–100.0)
Platelets: 165 10*3/uL (ref 150–400)
RBC: 2.48 MIL/uL — ABNORMAL LOW (ref 4.22–5.81)
RDW: 14.7 % (ref 11.5–15.5)
WBC: 4.1 10*3/uL (ref 4.0–10.5)
nRBC: 0 % (ref 0.0–0.2)

## 2023-05-10 LAB — FERRITIN: Ferritin: 152 ng/mL (ref 24–336)

## 2023-05-10 LAB — PROTEIN / CREATININE RATIO, URINE
Creatinine, Urine: 216 mg/dL
Protein Creatinine Ratio: 1.24 mg/mg{Cre} — ABNORMAL HIGH (ref 0.00–0.15)
Total Protein, Urine: 267 mg/dL

## 2023-05-11 DIAGNOSIS — N185 Chronic kidney disease, stage 5: Secondary | ICD-10-CM | POA: Diagnosis not present

## 2023-05-11 DIAGNOSIS — N269 Renal sclerosis, unspecified: Secondary | ICD-10-CM | POA: Diagnosis not present

## 2023-05-11 DIAGNOSIS — N2581 Secondary hyperparathyroidism of renal origin: Secondary | ICD-10-CM | POA: Diagnosis not present

## 2023-05-11 DIAGNOSIS — R808 Other proteinuria: Secondary | ICD-10-CM | POA: Diagnosis not present

## 2023-05-14 ENCOUNTER — Other Ambulatory Visit (HOSPITAL_COMMUNITY)
Admission: RE | Admit: 2023-05-14 | Discharge: 2023-05-14 | Disposition: A | Discharge status: Home / Self Care | Payer: 59 | Source: Ambulatory Visit | Attending: Nephrology | Admitting: Nephrology

## 2023-05-14 DIAGNOSIS — N184 Chronic kidney disease, stage 4 (severe): Secondary | ICD-10-CM | POA: Insufficient documentation

## 2023-05-14 DIAGNOSIS — N185 Chronic kidney disease, stage 5: Secondary | ICD-10-CM | POA: Insufficient documentation

## 2023-05-14 DIAGNOSIS — N2581 Secondary hyperparathyroidism of renal origin: Secondary | ICD-10-CM | POA: Diagnosis not present

## 2023-05-14 DIAGNOSIS — N269 Renal sclerosis, unspecified: Secondary | ICD-10-CM | POA: Diagnosis not present

## 2023-05-14 DIAGNOSIS — R808 Other proteinuria: Secondary | ICD-10-CM | POA: Diagnosis not present

## 2023-05-14 LAB — HEPATITIS B SURFACE ANTIGEN: Hepatitis B Surface Ag: NONREACTIVE

## 2023-05-14 LAB — HEPATITIS B CORE ANTIBODY, TOTAL: Hep B Core Total Ab: REACTIVE — AB

## 2023-05-14 LAB — HEPATITIS C ANTIBODY: HCV Ab: REACTIVE — AB

## 2023-05-15 ENCOUNTER — Ambulatory Visit: Payer: 59 | Admitting: Podiatry

## 2023-05-15 DIAGNOSIS — N2581 Secondary hyperparathyroidism of renal origin: Secondary | ICD-10-CM | POA: Insufficient documentation

## 2023-05-15 DIAGNOSIS — I5032 Chronic diastolic (congestive) heart failure: Secondary | ICD-10-CM | POA: Insufficient documentation

## 2023-05-15 DIAGNOSIS — Z992 Dependence on renal dialysis: Secondary | ICD-10-CM | POA: Insufficient documentation

## 2023-05-15 DIAGNOSIS — E1122 Type 2 diabetes mellitus with diabetic chronic kidney disease: Secondary | ICD-10-CM | POA: Insufficient documentation

## 2023-05-15 DIAGNOSIS — N185 Chronic kidney disease, stage 5: Secondary | ICD-10-CM | POA: Insufficient documentation

## 2023-05-15 DIAGNOSIS — D509 Iron deficiency anemia, unspecified: Secondary | ICD-10-CM | POA: Insufficient documentation

## 2023-05-15 DIAGNOSIS — E559 Vitamin D deficiency, unspecified: Secondary | ICD-10-CM | POA: Insufficient documentation

## 2023-05-15 DIAGNOSIS — I132 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 5 chronic kidney disease, or end stage renal disease: Secondary | ICD-10-CM | POA: Insufficient documentation

## 2023-05-16 LAB — QUANTIFERON-TB GOLD PLUS: QuantiFERON-TB Gold Plus: NEGATIVE

## 2023-05-16 LAB — QUANTIFERON-TB GOLD PLUS (RQFGPL)
QuantiFERON Mitogen Value: >10 IU/mL
QuantiFERON Nil Value: 0.03 IU/mL
QuantiFERON TB1 Ag Value: 0.02 IU/mL
QuantiFERON TB2 Ag Value: 0.06 IU/mL

## 2023-05-17 DIAGNOSIS — R52 Pain, unspecified: Secondary | ICD-10-CM | POA: Insufficient documentation

## 2023-05-17 DIAGNOSIS — T782XXA Anaphylactic shock, unspecified, initial encounter: Secondary | ICD-10-CM | POA: Insufficient documentation

## 2023-05-17 DIAGNOSIS — E039 Hypothyroidism, unspecified: Secondary | ICD-10-CM | POA: Insufficient documentation

## 2023-05-17 DIAGNOSIS — R197 Diarrhea, unspecified: Secondary | ICD-10-CM | POA: Insufficient documentation

## 2023-05-17 DIAGNOSIS — R110 Nausea: Secondary | ICD-10-CM | POA: Insufficient documentation

## 2023-05-17 DIAGNOSIS — K529 Noninfective gastroenteritis and colitis, unspecified: Secondary | ICD-10-CM | POA: Insufficient documentation

## 2023-05-17 DIAGNOSIS — L299 Pruritus, unspecified: Secondary | ICD-10-CM | POA: Insufficient documentation

## 2023-05-17 DIAGNOSIS — Z4802 Encounter for removal of sutures: Secondary | ICD-10-CM | POA: Insufficient documentation

## 2023-05-19 DIAGNOSIS — N2581 Secondary hyperparathyroidism of renal origin: Secondary | ICD-10-CM | POA: Diagnosis not present

## 2023-05-19 DIAGNOSIS — Z992 Dependence on renal dialysis: Secondary | ICD-10-CM | POA: Diagnosis not present

## 2023-05-19 DIAGNOSIS — N186 End stage renal disease: Secondary | ICD-10-CM | POA: Diagnosis not present

## 2023-05-19 DIAGNOSIS — D509 Iron deficiency anemia, unspecified: Secondary | ICD-10-CM | POA: Diagnosis not present

## 2023-05-19 DIAGNOSIS — D631 Anemia in chronic kidney disease: Secondary | ICD-10-CM | POA: Diagnosis not present

## 2023-05-22 DIAGNOSIS — N186 End stage renal disease: Secondary | ICD-10-CM | POA: Diagnosis not present

## 2023-05-22 DIAGNOSIS — Z992 Dependence on renal dialysis: Secondary | ICD-10-CM | POA: Diagnosis not present

## 2023-05-22 DIAGNOSIS — N2581 Secondary hyperparathyroidism of renal origin: Secondary | ICD-10-CM | POA: Diagnosis not present

## 2023-05-22 DIAGNOSIS — D631 Anemia in chronic kidney disease: Secondary | ICD-10-CM | POA: Diagnosis not present

## 2023-05-22 DIAGNOSIS — D509 Iron deficiency anemia, unspecified: Secondary | ICD-10-CM | POA: Diagnosis not present

## 2023-05-22 LAB — MISC LABCORP TEST (SEND OUT): Labcorp test code: 4800

## 2023-05-23 ENCOUNTER — Encounter: Payer: 59 | Admitting: Podiatry

## 2023-05-24 DIAGNOSIS — D509 Iron deficiency anemia, unspecified: Secondary | ICD-10-CM | POA: Diagnosis not present

## 2023-05-24 DIAGNOSIS — N186 End stage renal disease: Secondary | ICD-10-CM | POA: Diagnosis not present

## 2023-05-24 DIAGNOSIS — Z992 Dependence on renal dialysis: Secondary | ICD-10-CM | POA: Diagnosis not present

## 2023-05-24 DIAGNOSIS — D631 Anemia in chronic kidney disease: Secondary | ICD-10-CM | POA: Diagnosis not present

## 2023-05-24 DIAGNOSIS — N2581 Secondary hyperparathyroidism of renal origin: Secondary | ICD-10-CM | POA: Diagnosis not present

## 2023-05-26 DIAGNOSIS — N186 End stage renal disease: Secondary | ICD-10-CM | POA: Diagnosis not present

## 2023-05-26 DIAGNOSIS — D509 Iron deficiency anemia, unspecified: Secondary | ICD-10-CM | POA: Diagnosis not present

## 2023-05-26 DIAGNOSIS — N2581 Secondary hyperparathyroidism of renal origin: Secondary | ICD-10-CM | POA: Diagnosis not present

## 2023-05-26 DIAGNOSIS — D631 Anemia in chronic kidney disease: Secondary | ICD-10-CM | POA: Diagnosis not present

## 2023-05-26 DIAGNOSIS — Z992 Dependence on renal dialysis: Secondary | ICD-10-CM | POA: Diagnosis not present

## 2023-05-29 DIAGNOSIS — N186 End stage renal disease: Secondary | ICD-10-CM | POA: Diagnosis not present

## 2023-05-29 DIAGNOSIS — D631 Anemia in chronic kidney disease: Secondary | ICD-10-CM | POA: Diagnosis not present

## 2023-05-29 DIAGNOSIS — N2581 Secondary hyperparathyroidism of renal origin: Secondary | ICD-10-CM | POA: Diagnosis not present

## 2023-05-29 DIAGNOSIS — Z992 Dependence on renal dialysis: Secondary | ICD-10-CM | POA: Diagnosis not present

## 2023-05-29 DIAGNOSIS — D509 Iron deficiency anemia, unspecified: Secondary | ICD-10-CM | POA: Diagnosis not present

## 2023-05-31 ENCOUNTER — Other Ambulatory Visit: Payer: Self-pay

## 2023-05-31 ENCOUNTER — Emergency Department (HOSPITAL_COMMUNITY): Payer: 59

## 2023-05-31 ENCOUNTER — Emergency Department (HOSPITAL_COMMUNITY)
Admission: EM | Admit: 2023-05-31 | Discharge: 2023-05-31 | Disposition: A | Discharge status: Home / Self Care | Payer: 59 | Attending: Emergency Medicine | Admitting: Emergency Medicine

## 2023-05-31 DIAGNOSIS — R55 Syncope and collapse: Secondary | ICD-10-CM | POA: Insufficient documentation

## 2023-05-31 DIAGNOSIS — Z743 Need for continuous supervision: Secondary | ICD-10-CM | POA: Diagnosis not present

## 2023-05-31 DIAGNOSIS — Z1152 Encounter for screening for COVID-19: Secondary | ICD-10-CM | POA: Insufficient documentation

## 2023-05-31 DIAGNOSIS — D631 Anemia in chronic kidney disease: Secondary | ICD-10-CM | POA: Diagnosis not present

## 2023-05-31 DIAGNOSIS — G9389 Other specified disorders of brain: Secondary | ICD-10-CM | POA: Diagnosis not present

## 2023-05-31 DIAGNOSIS — N186 End stage renal disease: Secondary | ICD-10-CM | POA: Diagnosis not present

## 2023-05-31 DIAGNOSIS — E1122 Type 2 diabetes mellitus with diabetic chronic kidney disease: Secondary | ICD-10-CM | POA: Diagnosis not present

## 2023-05-31 DIAGNOSIS — R531 Weakness: Secondary | ICD-10-CM | POA: Diagnosis not present

## 2023-05-31 DIAGNOSIS — D509 Iron deficiency anemia, unspecified: Secondary | ICD-10-CM | POA: Diagnosis not present

## 2023-05-31 DIAGNOSIS — R404 Transient alteration of awareness: Secondary | ICD-10-CM | POA: Diagnosis not present

## 2023-05-31 DIAGNOSIS — E039 Hypothyroidism, unspecified: Secondary | ICD-10-CM | POA: Diagnosis not present

## 2023-05-31 DIAGNOSIS — Z992 Dependence on renal dialysis: Secondary | ICD-10-CM | POA: Diagnosis not present

## 2023-05-31 DIAGNOSIS — R5383 Other fatigue: Secondary | ICD-10-CM | POA: Diagnosis not present

## 2023-05-31 DIAGNOSIS — N2581 Secondary hyperparathyroidism of renal origin: Secondary | ICD-10-CM | POA: Diagnosis not present

## 2023-05-31 DIAGNOSIS — D689 Coagulation defect, unspecified: Secondary | ICD-10-CM | POA: Diagnosis not present

## 2023-05-31 DIAGNOSIS — Z7982 Long term (current) use of aspirin: Secondary | ICD-10-CM | POA: Diagnosis not present

## 2023-05-31 DIAGNOSIS — R0602 Shortness of breath: Secondary | ICD-10-CM | POA: Insufficient documentation

## 2023-05-31 LAB — CBC WITH DIFFERENTIAL/PLATELET
Abs Immature Granulocytes: 0.02 10*3/uL (ref 0.00–0.07)
Basophils Absolute: 0 10*3/uL (ref 0.0–0.1)
Basophils Relative: 1 %
Eosinophils Absolute: 0.1 10*3/uL (ref 0.0–0.5)
Eosinophils Relative: 1 %
HCT: 25.8 % — ABNORMAL LOW (ref 39.0–52.0)
Hemoglobin: 7.8 g/dL — ABNORMAL LOW (ref 13.0–17.0)
Immature Granulocytes: 0 %
Lymphocytes Relative: 31 %
Lymphs Abs: 1.5 10*3/uL (ref 0.7–4.0)
MCH: 30.1 pg (ref 26.0–34.0)
MCHC: 30.2 g/dL (ref 30.0–36.0)
MCV: 99.6 fL (ref 80.0–100.0)
Monocytes Absolute: 0.5 10*3/uL (ref 0.1–1.0)
Monocytes Relative: 10 %
Neutro Abs: 2.7 10*3/uL (ref 1.7–7.7)
Neutrophils Relative %: 57 %
Platelets: 221 10*3/uL (ref 150–400)
RBC: 2.59 MIL/uL — ABNORMAL LOW (ref 4.22–5.81)
RDW: 13.6 % (ref 11.5–15.5)
WBC: 4.7 10*3/uL (ref 4.0–10.5)
nRBC: 0 % (ref 0.0–0.2)

## 2023-05-31 LAB — COMPREHENSIVE METABOLIC PANEL
ALT: 16 U/L (ref 0–44)
AST: 14 U/L — ABNORMAL LOW (ref 15–41)
Albumin: 4 g/dL (ref 3.5–5.0)
Alkaline Phosphatase: 69 U/L (ref 38–126)
Anion gap: 12 (ref 5–15)
BUN: 31 mg/dL — ABNORMAL HIGH (ref 8–23)
CO2: 26 mmol/L (ref 22–32)
Calcium: 9.1 mg/dL (ref 8.9–10.3)
Chloride: 100 mmol/L (ref 98–111)
Creatinine, Ser: 5.6 mg/dL — ABNORMAL HIGH (ref 0.61–1.24)
GFR, Estimated: 11 mL/min — ABNORMAL LOW (ref >60)
Glucose, Bld: 116 mg/dL — ABNORMAL HIGH (ref 70–99)
Potassium: 3.5 mmol/L (ref 3.5–5.1)
Sodium: 138 mmol/L (ref 135–145)
Total Bilirubin: 0.3 mg/dL (ref 0.3–1.2)
Total Protein: 7.1 g/dL (ref 6.5–8.1)

## 2023-05-31 LAB — TROPONIN I (HIGH SENSITIVITY)
Troponin I (High Sensitivity): 30 ng/L — ABNORMAL HIGH (ref <18)
Troponin I (High Sensitivity): 29 ng/L — ABNORMAL HIGH (ref <18)

## 2023-05-31 LAB — CULTURE, BLOOD (ROUTINE X 2)
Culture: NO GROWTH
Culture: NO GROWTH
Special Requests: ADEQUATE

## 2023-05-31 LAB — LACTIC ACID, PLASMA: Lactic Acid, Venous: 1 mmol/L (ref 0.5–1.9)

## 2023-05-31 LAB — SARS CORONAVIRUS 2 BY RT PCR: SARS Coronavirus 2 by RT PCR: NEGATIVE

## 2023-05-31 LAB — BRAIN NATRIURETIC PEPTIDE: B Natriuretic Peptide: 327 pg/mL — ABNORMAL HIGH (ref 0.0–100.0)

## 2023-05-31 MED ORDER — ACETAMINOPHEN 325 MG PO TABS
650.0000 mg | ORAL_TABLET | Freq: Once | ORAL | Status: DC
  Filled 2023-05-31: qty 2

## 2023-05-31 NOTE — ED Provider Notes (Signed)
[place] EMERGENCY DEPARTMENT AT [facility] Provider Note   CSN: [id number] Arrival date & time: 05/31/23  4259     History  No chief complaint on file.   Charles [name] is a 67 y.o. male.  Patient presents to the emergency department by ambulance from dialysis center.  Patient is is just starting dialysis, has been on dialysis for a couple of weeks.  He presented to the dialysis center this morning for his scheduled dialysis.  He was noted to be groggy, unstable on his feet.  When he sat down in the chair to initiate his dialysis session, he had a syncopal episode.  He was reportedly unresponsive for about a minute and then became awake.  No seizure-like activity.  Patient reports that he does not feel well but cannot give me any specifics at this time.  He does feel mildly short of breath but has been 100% on room air during transport.  He does report that he felt well last night when he went to bed, has not been ill recently.       Home Medications Prior to Admission medications   Medication Sig Start Date End Date Taking? Authorizing Provider  acetaminophen (TYLENOL 8 HOUR) 650 MG CR tablet Take 1 tablet (650 mg total) by mouth every 8 (eight) hours as needed for pain or fever. Patient not taking: Reported on 02/28/2023 07/28/20   Derwood Kaplan, MD  aspirin EC 81 MG tablet Take 1 tablet (81 mg total) by mouth daily with breakfast. Patient not taking: Reported on 02/28/2023 07/15/18   Malissa Hippo, MD  calcitRIOL (ROCALTROL) 0.25 MCG capsule Take 1 capsule (0.25 mcg total) by mouth daily. Patient not taking: Reported on 02/28/2023 08/23/22 08/23/23  Vassie Loll, MD  carvedilol (COREG) 25 MG tablet Take 25 mg by mouth 2 (two) times daily. Patient not taking: Reported on 02/28/2023 11/29/21   [provider]  Cholecalciferol 25 MCG (1000 UT) capsule Take 5,000 Units by mouth daily. Patient not taking: Reported on 02/28/2023    [provider]  diclofenac  Sodium (VOLTAREN) 1 % GEL Apply 2 g topically 4 (four) times daily. Patient not taking: Reported on 02/28/2023    [provider]  ferrous sulfate 325 (65 FE) MG tablet Take 325 mg by mouth daily with breakfast. Patient not taking: Reported on 02/28/2023    [provider]  furosemide (LASIX) 20 MG tablet Take 40 mg by mouth daily. Patient not taking: Reported on 02/28/2023    [provider]  gabapentin (NEURONTIN) 300 MG capsule Take 1 capsule (300 mg total) by mouth daily. Patient not taking: Reported on 02/28/2023 01/23/23   Anabel Halon, MD  ketoconazole (NIZORAL) 2 % cream Apply 1 Application topically daily. Patient not taking: Reported on 02/28/2023 01/25/23   Anabel Halon, MD  NIFEdipine (PROCARDIA XL/NIFEDICAL XL) 60 MG 24 hr tablet Take 1 tablet (60 mg total) by mouth daily. Patient not taking: Reported on 02/28/2023 08/23/22 08/23/23  Vassie Loll, MD  omeprazole (PRILOSEC) 40 MG capsule Take 1 capsule (40 mg total) by mouth 2 (two) times daily. Patient not taking: Reported on 02/28/2023 08/23/22 10/22/22  Vassie Loll, MD  OVER THE COUNTER MEDICATION D 3 5,000 IU once per day. In addition to five 1,000 IU daily. Patient not taking: Reported on 02/28/2023    [provider]  oxyCODONE-acetaminophen (PERCOCET) 5-325 MG tablet Take 1 tablet by mouth every 6 (six) hours as needed for severe pain. 03/13/23  Larina Earthly, MD  sodium bicarbonate 650 MG tablet Take 650 mg by mouth 3 (three) times daily. Patient not taking: Reported on 02/28/2023    [provider]  tamsulosin (FLOMAX) 0.4 MG CAPS capsule Take 0.4 mg by mouth daily. Patient not taking: Reported on 02/28/2023    [provider]      Allergies    Patient has no known allergies.    Review of Systems   Review of Systems  Physical Exam Updated Vital Signs Ht 5\' 10"  (1.778 m)   Wt 83 kg   BMI 26.26 kg/m  Physical Exam Vitals and nursing note reviewed.  Constitutional:       General: He is not in acute distress.    Appearance: He is well-developed.  HENT:     Head: Normocephalic and atraumatic.     Mouth/Throat:     Mouth: Mucous membranes are moist.  Eyes:     General: Vision grossly intact. Gaze aligned appropriately.     Extraocular Movements: Extraocular movements intact.     Conjunctiva/sclera: Conjunctivae normal.  Cardiovascular:     Rate and Rhythm: Normal rate and regular rhythm.     Pulses: Normal pulses.     Heart sounds: Normal heart sounds, S1 normal and S2 normal. No murmur heard.    No friction rub. No gallop.  Pulmonary:     Effort: Pulmonary effort is normal. No respiratory distress.     Breath sounds: Normal breath sounds.  Abdominal:     Palpations: Abdomen is soft.     Tenderness: There is no abdominal tenderness. There is no guarding or rebound.     Hernia: No hernia is present.  Musculoskeletal:        General: No swelling.     Cervical back: Full passive range of motion without pain, normal range of motion and neck supple. No pain with movement, spinous process tenderness or muscular tenderness. Normal range of motion.     Right lower leg: No edema.     Left lower leg: No edema.  Skin:    General: Skin is warm and dry.     Capillary Refill: Capillary refill takes less than 2 seconds.     Findings: No ecchymosis, erythema, lesion or wound.  Neurological:     Mental Status: He is alert and oriented to person, place, and time.     GCS: GCS eye subscore is 4. GCS verbal subscore is 5. GCS motor subscore is 6.     Cranial Nerves: Cranial nerves 2-12 are intact.     Sensory: Sensation is intact.     Motor: Motor function is intact. No weakness or abnormal muscle tone.     Coordination: Coordination is intact.  Psychiatric:        Mood and Affect: Mood normal.        Speech: Speech normal.        Behavior: Behavior normal.     ED Results / Procedures / Treatments   Labs (all labs ordered are listed, but only abnormal  results are displayed) Labs Reviewed  CULTURE, BLOOD (ROUTINE X 2)  CULTURE, BLOOD (ROUTINE X 2)  SARS CORONAVIRUS 2 BY RT PCR  CBC WITH DIFFERENTIAL/PLATELET  COMPREHENSIVE METABOLIC PANEL  BRAIN NATRIURETIC PEPTIDE  URINALYSIS, W/ REFLEX TO CULTURE (INFECTION SUSPECTED)  LACTIC ACID, PLASMA  TROPONIN I (HIGH SENSITIVITY)    EKG EKG Interpretation Date/Time:  Thursday May 31 2023 06:19:22 EDT Ventricular Rate:  71 PR Interval:  222 QRS  Duration:  99 QT Interval:  416 QTC Calculation: 453 R Axis:   -46  Text Interpretation: Sinus rhythm Prolonged PR interval LAD, consider left anterior fascicular block Left ventricular hypertrophy Anterior Q waves, possibly due to LVH No significant change since last tracing Confirmed by Gilda Crease [phone]) on 05/31/2023 6:27:20 AM  Radiology No results found.  Procedures Procedures    Medications Ordered in ED Medications - No data to display  ED Course/ Medical Decision Making/ A&P                                 Medical Decision Making Amount and/or Complexity of Data Reviewed External Data Reviewed: labs, ECG and notes. Labs: ordered. Decision-making details documented in ED Course. Radiology: ordered. ECG/medicine tests: ordered and independent interpretation performed. Decision-making details documented in ED Course.   Differential Diagnosis considered includes, but not limited to: Arrhythmia; MI; vasovagal episode; seizure; toxidrome; PE; stroke  Patient presents after brief syncopal episode that occurred when he arrived at dialysis today.  Reviewing his records, patient had progressively worsening chronic kidney disease and just started dialysis recently.  Presents to the ED by EMS.  He has been awake and alert but groggy during transport.  He has been normotensive.  No arrhythmias.  At arrival patient without specific complaints.  Workup initiated.  Differential is broad.  Will sign out to oncoming ER  physician to review initial data and determine if additional testing is necessary and disposition.        Final Clinical Impression(s) / ED Diagnoses Final diagnoses:  Syncope, unspecified syncope type    Rx / DC Orders ED Discharge Orders     None         , Canary Brim, MD 06/01/23 [phone]

## 2023-05-31 NOTE — ED Triage Notes (Signed)
Pt bib [facility] c/o syncopal episode this morning and SOB. Pt was scheduled to get dialysis this AM but was sent here.

## 2023-05-31 NOTE — ED Notes (Signed)
Pt unable to give urine specimen at this time 

## 2023-05-31 NOTE — ED Notes (Signed)
[facility] for verification of dialysis bed for pt.  Secretary contacted EMS for transportation to [facility].

## 2023-05-31 NOTE — ED Provider Notes (Signed)
  Physical Exam  BP (!) 160/80   Pulse 73   Temp (!) 97.5 F (36.4 C) (Oral)   Resp 14   Ht 5\' 10"  (1.778 m)   Wt 83 kg   SpO2 100%   BMI 26.26 kg/m   Physical Exam Constitutional:      General: He is not in acute distress.    Appearance: Normal appearance.  HENT:     Head: Normocephalic and atraumatic.     Nose: No congestion or rhinorrhea.  Eyes:     General:        Right eye: No discharge.        Left eye: No discharge.     Extraocular Movements: Extraocular movements intact.     Pupils: Pupils are equal, round, and reactive to light.  Cardiovascular:     Rate and Rhythm: Normal rate and regular rhythm.     Heart sounds: No murmur heard. Pulmonary:     Effort: No respiratory distress.     Breath sounds: No wheezing or rales.  Abdominal:     General: There is no distension.     Tenderness: There is no abdominal tenderness.  Musculoskeletal:        General: Normal range of motion.     Cervical back: Normal range of motion.  Skin:    General: Skin is warm and dry.  Neurological:     General: No focal deficit present.     Mental Status: He is alert.     Procedures  Procedures  ED Course / MDM    Medical Decision Making Amount and/or Complexity of Data Reviewed Labs: ordered. Radiology: ordered.  Risk OTC drugs.   Patient received in handoff.  Syncopal episode before dialysis pending laboratory evaluation.  Laboratory evaluation largely unremarkable outside of a minimally elevated BNP to 327, BUN 31, creatinine 5.6, hemoglobin 7.8 which is patient's baseline.  Lactic acid is normal, high-sensitivity troponin is 30 but delta troponin is 29.  On reevaluation, patient appears very well and is eating and drinking without difficulty.  Patient would benefit from getting his dialysis today and has had no additional syncopal episodes here in the emergency department.  He states that he just felt "tired" at dialysis because it was early in the morning.  I called the  dialysis center and they do have a chair available for him to have his dialysis today and thus patient was discharged to have his routine dialysis.       Glendora Score, MD 05/31/23 [phone]

## 2023-05-31 NOTE — ED Notes (Signed)
Gave pt sandwich and fluids.

## 2023-05-31 NOTE — ED Notes (Signed)
Patient transported to CT 

## 2023-06-02 DIAGNOSIS — E1122 Type 2 diabetes mellitus with diabetic chronic kidney disease: Secondary | ICD-10-CM | POA: Diagnosis not present

## 2023-06-02 DIAGNOSIS — Z992 Dependence on renal dialysis: Secondary | ICD-10-CM | POA: Diagnosis not present

## 2023-06-02 DIAGNOSIS — D631 Anemia in chronic kidney disease: Secondary | ICD-10-CM | POA: Diagnosis not present

## 2023-06-02 DIAGNOSIS — N2581 Secondary hyperparathyroidism of renal origin: Secondary | ICD-10-CM | POA: Diagnosis not present

## 2023-06-02 DIAGNOSIS — N186 End stage renal disease: Secondary | ICD-10-CM | POA: Diagnosis not present

## 2023-06-02 DIAGNOSIS — D509 Iron deficiency anemia, unspecified: Secondary | ICD-10-CM | POA: Diagnosis not present

## 2023-06-02 DIAGNOSIS — D689 Coagulation defect, unspecified: Secondary | ICD-10-CM | POA: Diagnosis not present

## 2023-06-02 DIAGNOSIS — E039 Hypothyroidism, unspecified: Secondary | ICD-10-CM | POA: Diagnosis not present

## 2023-06-05 ENCOUNTER — Encounter: Payer: 59 | Admitting: Internal Medicine

## 2023-06-05 DIAGNOSIS — D509 Iron deficiency anemia, unspecified: Secondary | ICD-10-CM | POA: Diagnosis not present

## 2023-06-05 DIAGNOSIS — D689 Coagulation defect, unspecified: Secondary | ICD-10-CM | POA: Diagnosis not present

## 2023-06-05 DIAGNOSIS — E039 Hypothyroidism, unspecified: Secondary | ICD-10-CM | POA: Diagnosis not present

## 2023-06-05 DIAGNOSIS — N186 End stage renal disease: Secondary | ICD-10-CM | POA: Diagnosis not present

## 2023-06-05 DIAGNOSIS — E1122 Type 2 diabetes mellitus with diabetic chronic kidney disease: Secondary | ICD-10-CM | POA: Diagnosis not present

## 2023-06-05 DIAGNOSIS — N2581 Secondary hyperparathyroidism of renal origin: Secondary | ICD-10-CM | POA: Diagnosis not present

## 2023-06-05 DIAGNOSIS — Z992 Dependence on renal dialysis: Secondary | ICD-10-CM | POA: Diagnosis not present

## 2023-06-05 DIAGNOSIS — D631 Anemia in chronic kidney disease: Secondary | ICD-10-CM | POA: Diagnosis not present

## 2023-06-06 ENCOUNTER — Encounter: Payer: 59 | Admitting: Podiatry

## 2023-06-07 ENCOUNTER — Encounter: Payer: Self-pay | Admitting: Internal Medicine

## 2023-06-07 DIAGNOSIS — D631 Anemia in chronic kidney disease: Secondary | ICD-10-CM | POA: Diagnosis not present

## 2023-06-07 DIAGNOSIS — D509 Iron deficiency anemia, unspecified: Secondary | ICD-10-CM | POA: Diagnosis not present

## 2023-06-07 DIAGNOSIS — D689 Coagulation defect, unspecified: Secondary | ICD-10-CM | POA: Diagnosis not present

## 2023-06-07 DIAGNOSIS — N186 End stage renal disease: Secondary | ICD-10-CM | POA: Diagnosis not present

## 2023-06-07 DIAGNOSIS — E1122 Type 2 diabetes mellitus with diabetic chronic kidney disease: Secondary | ICD-10-CM | POA: Diagnosis not present

## 2023-06-07 DIAGNOSIS — N2581 Secondary hyperparathyroidism of renal origin: Secondary | ICD-10-CM | POA: Diagnosis not present

## 2023-06-07 DIAGNOSIS — E039 Hypothyroidism, unspecified: Secondary | ICD-10-CM | POA: Diagnosis not present

## 2023-06-07 DIAGNOSIS — Z992 Dependence on renal dialysis: Secondary | ICD-10-CM | POA: Diagnosis not present

## 2023-06-09 DIAGNOSIS — D689 Coagulation defect, unspecified: Secondary | ICD-10-CM | POA: Diagnosis not present

## 2023-06-09 DIAGNOSIS — D631 Anemia in chronic kidney disease: Secondary | ICD-10-CM | POA: Diagnosis not present

## 2023-06-09 DIAGNOSIS — N186 End stage renal disease: Secondary | ICD-10-CM | POA: Diagnosis not present

## 2023-06-09 DIAGNOSIS — D509 Iron deficiency anemia, unspecified: Secondary | ICD-10-CM | POA: Diagnosis not present

## 2023-06-09 DIAGNOSIS — E039 Hypothyroidism, unspecified: Secondary | ICD-10-CM | POA: Diagnosis not present

## 2023-06-09 DIAGNOSIS — N2581 Secondary hyperparathyroidism of renal origin: Secondary | ICD-10-CM | POA: Diagnosis not present

## 2023-06-09 DIAGNOSIS — E1122 Type 2 diabetes mellitus with diabetic chronic kidney disease: Secondary | ICD-10-CM | POA: Diagnosis not present

## 2023-06-09 DIAGNOSIS — Z992 Dependence on renal dialysis: Secondary | ICD-10-CM | POA: Diagnosis not present

## 2023-06-12 DIAGNOSIS — D509 Iron deficiency anemia, unspecified: Secondary | ICD-10-CM | POA: Diagnosis not present

## 2023-06-12 DIAGNOSIS — D631 Anemia in chronic kidney disease: Secondary | ICD-10-CM | POA: Diagnosis not present

## 2023-06-12 DIAGNOSIS — E1122 Type 2 diabetes mellitus with diabetic chronic kidney disease: Secondary | ICD-10-CM | POA: Diagnosis not present

## 2023-06-12 DIAGNOSIS — N186 End stage renal disease: Secondary | ICD-10-CM | POA: Diagnosis not present

## 2023-06-12 DIAGNOSIS — D689 Coagulation defect, unspecified: Secondary | ICD-10-CM | POA: Diagnosis not present

## 2023-06-12 DIAGNOSIS — E039 Hypothyroidism, unspecified: Secondary | ICD-10-CM | POA: Diagnosis not present

## 2023-06-12 DIAGNOSIS — N2581 Secondary hyperparathyroidism of renal origin: Secondary | ICD-10-CM | POA: Diagnosis not present

## 2023-06-12 DIAGNOSIS — Z992 Dependence on renal dialysis: Secondary | ICD-10-CM | POA: Diagnosis not present

## 2023-06-14 DIAGNOSIS — E039 Hypothyroidism, unspecified: Secondary | ICD-10-CM | POA: Diagnosis not present

## 2023-06-14 DIAGNOSIS — E1122 Type 2 diabetes mellitus with diabetic chronic kidney disease: Secondary | ICD-10-CM | POA: Diagnosis not present

## 2023-06-14 DIAGNOSIS — D631 Anemia in chronic kidney disease: Secondary | ICD-10-CM | POA: Diagnosis not present

## 2023-06-14 DIAGNOSIS — Z992 Dependence on renal dialysis: Secondary | ICD-10-CM | POA: Diagnosis not present

## 2023-06-14 DIAGNOSIS — D509 Iron deficiency anemia, unspecified: Secondary | ICD-10-CM | POA: Diagnosis not present

## 2023-06-14 DIAGNOSIS — N186 End stage renal disease: Secondary | ICD-10-CM | POA: Diagnosis not present

## 2023-06-14 DIAGNOSIS — N2581 Secondary hyperparathyroidism of renal origin: Secondary | ICD-10-CM | POA: Diagnosis not present

## 2023-06-14 DIAGNOSIS — D689 Coagulation defect, unspecified: Secondary | ICD-10-CM | POA: Diagnosis not present

## 2023-06-16 DIAGNOSIS — D509 Iron deficiency anemia, unspecified: Secondary | ICD-10-CM | POA: Diagnosis not present

## 2023-06-16 DIAGNOSIS — E039 Hypothyroidism, unspecified: Secondary | ICD-10-CM | POA: Diagnosis not present

## 2023-06-16 DIAGNOSIS — Z992 Dependence on renal dialysis: Secondary | ICD-10-CM | POA: Diagnosis not present

## 2023-06-16 DIAGNOSIS — N186 End stage renal disease: Secondary | ICD-10-CM | POA: Diagnosis not present

## 2023-06-16 DIAGNOSIS — D631 Anemia in chronic kidney disease: Secondary | ICD-10-CM | POA: Diagnosis not present

## 2023-06-16 DIAGNOSIS — D689 Coagulation defect, unspecified: Secondary | ICD-10-CM | POA: Diagnosis not present

## 2023-06-16 DIAGNOSIS — N2581 Secondary hyperparathyroidism of renal origin: Secondary | ICD-10-CM | POA: Diagnosis not present

## 2023-06-16 DIAGNOSIS — E1122 Type 2 diabetes mellitus with diabetic chronic kidney disease: Secondary | ICD-10-CM | POA: Diagnosis not present

## 2023-06-19 DIAGNOSIS — N2581 Secondary hyperparathyroidism of renal origin: Secondary | ICD-10-CM | POA: Diagnosis not present

## 2023-06-19 DIAGNOSIS — D631 Anemia in chronic kidney disease: Secondary | ICD-10-CM | POA: Diagnosis not present

## 2023-06-19 DIAGNOSIS — N186 End stage renal disease: Secondary | ICD-10-CM | POA: Diagnosis not present

## 2023-06-19 DIAGNOSIS — E039 Hypothyroidism, unspecified: Secondary | ICD-10-CM | POA: Diagnosis not present

## 2023-06-19 DIAGNOSIS — D509 Iron deficiency anemia, unspecified: Secondary | ICD-10-CM | POA: Diagnosis not present

## 2023-06-19 DIAGNOSIS — Z992 Dependence on renal dialysis: Secondary | ICD-10-CM | POA: Diagnosis not present

## 2023-06-19 DIAGNOSIS — E1122 Type 2 diabetes mellitus with diabetic chronic kidney disease: Secondary | ICD-10-CM | POA: Diagnosis not present

## 2023-06-19 DIAGNOSIS — D689 Coagulation defect, unspecified: Secondary | ICD-10-CM | POA: Diagnosis not present

## 2023-06-21 DIAGNOSIS — D509 Iron deficiency anemia, unspecified: Secondary | ICD-10-CM | POA: Diagnosis not present

## 2023-06-21 DIAGNOSIS — E1122 Type 2 diabetes mellitus with diabetic chronic kidney disease: Secondary | ICD-10-CM | POA: Diagnosis not present

## 2023-06-21 DIAGNOSIS — N2581 Secondary hyperparathyroidism of renal origin: Secondary | ICD-10-CM | POA: Diagnosis not present

## 2023-06-21 DIAGNOSIS — D689 Coagulation defect, unspecified: Secondary | ICD-10-CM | POA: Diagnosis not present

## 2023-06-21 DIAGNOSIS — N186 End stage renal disease: Secondary | ICD-10-CM | POA: Diagnosis not present

## 2023-06-21 DIAGNOSIS — D631 Anemia in chronic kidney disease: Secondary | ICD-10-CM | POA: Diagnosis not present

## 2023-06-21 DIAGNOSIS — E039 Hypothyroidism, unspecified: Secondary | ICD-10-CM | POA: Diagnosis not present

## 2023-06-21 DIAGNOSIS — Z992 Dependence on renal dialysis: Secondary | ICD-10-CM | POA: Diagnosis not present

## 2023-06-23 DIAGNOSIS — D689 Coagulation defect, unspecified: Secondary | ICD-10-CM | POA: Diagnosis not present

## 2023-06-23 DIAGNOSIS — D631 Anemia in chronic kidney disease: Secondary | ICD-10-CM | POA: Diagnosis not present

## 2023-06-23 DIAGNOSIS — N2581 Secondary hyperparathyroidism of renal origin: Secondary | ICD-10-CM | POA: Diagnosis not present

## 2023-06-23 DIAGNOSIS — D509 Iron deficiency anemia, unspecified: Secondary | ICD-10-CM | POA: Diagnosis not present

## 2023-06-23 DIAGNOSIS — N186 End stage renal disease: Secondary | ICD-10-CM | POA: Diagnosis not present

## 2023-06-23 DIAGNOSIS — Z992 Dependence on renal dialysis: Secondary | ICD-10-CM | POA: Diagnosis not present

## 2023-06-23 DIAGNOSIS — E039 Hypothyroidism, unspecified: Secondary | ICD-10-CM | POA: Diagnosis not present

## 2023-06-23 DIAGNOSIS — E1122 Type 2 diabetes mellitus with diabetic chronic kidney disease: Secondary | ICD-10-CM | POA: Diagnosis not present

## 2023-06-26 DIAGNOSIS — E1122 Type 2 diabetes mellitus with diabetic chronic kidney disease: Secondary | ICD-10-CM | POA: Diagnosis not present

## 2023-06-26 DIAGNOSIS — Z992 Dependence on renal dialysis: Secondary | ICD-10-CM | POA: Diagnosis not present

## 2023-06-26 DIAGNOSIS — N186 End stage renal disease: Secondary | ICD-10-CM | POA: Diagnosis not present

## 2023-06-26 DIAGNOSIS — D689 Coagulation defect, unspecified: Secondary | ICD-10-CM | POA: Diagnosis not present

## 2023-06-26 DIAGNOSIS — N2581 Secondary hyperparathyroidism of renal origin: Secondary | ICD-10-CM | POA: Diagnosis not present

## 2023-06-26 DIAGNOSIS — E039 Hypothyroidism, unspecified: Secondary | ICD-10-CM | POA: Diagnosis not present

## 2023-06-26 DIAGNOSIS — D631 Anemia in chronic kidney disease: Secondary | ICD-10-CM | POA: Diagnosis not present

## 2023-06-26 DIAGNOSIS — D509 Iron deficiency anemia, unspecified: Secondary | ICD-10-CM | POA: Diagnosis not present

## 2023-06-28 DIAGNOSIS — D631 Anemia in chronic kidney disease: Secondary | ICD-10-CM | POA: Diagnosis not present

## 2023-06-28 DIAGNOSIS — N2581 Secondary hyperparathyroidism of renal origin: Secondary | ICD-10-CM | POA: Diagnosis not present

## 2023-06-28 DIAGNOSIS — N186 End stage renal disease: Secondary | ICD-10-CM | POA: Diagnosis not present

## 2023-06-28 DIAGNOSIS — E039 Hypothyroidism, unspecified: Secondary | ICD-10-CM | POA: Diagnosis not present

## 2023-06-28 DIAGNOSIS — E1122 Type 2 diabetes mellitus with diabetic chronic kidney disease: Secondary | ICD-10-CM | POA: Diagnosis not present

## 2023-06-28 DIAGNOSIS — Z992 Dependence on renal dialysis: Secondary | ICD-10-CM | POA: Diagnosis not present

## 2023-06-28 DIAGNOSIS — D509 Iron deficiency anemia, unspecified: Secondary | ICD-10-CM | POA: Diagnosis not present

## 2023-06-28 DIAGNOSIS — D689 Coagulation defect, unspecified: Secondary | ICD-10-CM | POA: Diagnosis not present

## 2023-06-30 DIAGNOSIS — Z992 Dependence on renal dialysis: Secondary | ICD-10-CM | POA: Diagnosis not present

## 2023-06-30 DIAGNOSIS — N2581 Secondary hyperparathyroidism of renal origin: Secondary | ICD-10-CM | POA: Diagnosis not present

## 2023-06-30 DIAGNOSIS — D689 Coagulation defect, unspecified: Secondary | ICD-10-CM | POA: Diagnosis not present

## 2023-06-30 DIAGNOSIS — D631 Anemia in chronic kidney disease: Secondary | ICD-10-CM | POA: Diagnosis not present

## 2023-06-30 DIAGNOSIS — D509 Iron deficiency anemia, unspecified: Secondary | ICD-10-CM | POA: Diagnosis not present

## 2023-06-30 DIAGNOSIS — N186 End stage renal disease: Secondary | ICD-10-CM | POA: Diagnosis not present

## 2023-06-30 DIAGNOSIS — E039 Hypothyroidism, unspecified: Secondary | ICD-10-CM | POA: Diagnosis not present

## 2023-06-30 DIAGNOSIS — E1122 Type 2 diabetes mellitus with diabetic chronic kidney disease: Secondary | ICD-10-CM | POA: Diagnosis not present

## 2023-07-01 DIAGNOSIS — N186 End stage renal disease: Secondary | ICD-10-CM | POA: Diagnosis not present

## 2023-07-01 DIAGNOSIS — Z992 Dependence on renal dialysis: Secondary | ICD-10-CM | POA: Diagnosis not present

## 2023-07-03 DIAGNOSIS — Z992 Dependence on renal dialysis: Secondary | ICD-10-CM | POA: Diagnosis not present

## 2023-07-03 DIAGNOSIS — D689 Coagulation defect, unspecified: Secondary | ICD-10-CM | POA: Diagnosis not present

## 2023-07-03 DIAGNOSIS — D631 Anemia in chronic kidney disease: Secondary | ICD-10-CM | POA: Diagnosis not present

## 2023-07-03 DIAGNOSIS — R52 Pain, unspecified: Secondary | ICD-10-CM | POA: Diagnosis not present

## 2023-07-03 DIAGNOSIS — N186 End stage renal disease: Secondary | ICD-10-CM | POA: Diagnosis not present

## 2023-07-03 DIAGNOSIS — E1122 Type 2 diabetes mellitus with diabetic chronic kidney disease: Secondary | ICD-10-CM | POA: Diagnosis not present

## 2023-07-03 DIAGNOSIS — D509 Iron deficiency anemia, unspecified: Secondary | ICD-10-CM | POA: Diagnosis not present

## 2023-07-03 DIAGNOSIS — N2581 Secondary hyperparathyroidism of renal origin: Secondary | ICD-10-CM | POA: Diagnosis not present

## 2023-07-05 DIAGNOSIS — N2581 Secondary hyperparathyroidism of renal origin: Secondary | ICD-10-CM | POA: Diagnosis not present

## 2023-07-05 DIAGNOSIS — E1122 Type 2 diabetes mellitus with diabetic chronic kidney disease: Secondary | ICD-10-CM | POA: Diagnosis not present

## 2023-07-05 DIAGNOSIS — D689 Coagulation defect, unspecified: Secondary | ICD-10-CM | POA: Diagnosis not present

## 2023-07-05 DIAGNOSIS — N186 End stage renal disease: Secondary | ICD-10-CM | POA: Diagnosis not present

## 2023-07-05 DIAGNOSIS — D631 Anemia in chronic kidney disease: Secondary | ICD-10-CM | POA: Diagnosis not present

## 2023-07-05 DIAGNOSIS — R52 Pain, unspecified: Secondary | ICD-10-CM | POA: Diagnosis not present

## 2023-07-05 DIAGNOSIS — D509 Iron deficiency anemia, unspecified: Secondary | ICD-10-CM | POA: Diagnosis not present

## 2023-07-05 DIAGNOSIS — Z992 Dependence on renal dialysis: Secondary | ICD-10-CM | POA: Diagnosis not present

## 2023-07-07 DIAGNOSIS — N186 End stage renal disease: Secondary | ICD-10-CM | POA: Diagnosis not present

## 2023-07-07 DIAGNOSIS — R52 Pain, unspecified: Secondary | ICD-10-CM | POA: Diagnosis not present

## 2023-07-07 DIAGNOSIS — Z992 Dependence on renal dialysis: Secondary | ICD-10-CM | POA: Diagnosis not present

## 2023-07-07 DIAGNOSIS — D689 Coagulation defect, unspecified: Secondary | ICD-10-CM | POA: Diagnosis not present

## 2023-07-07 DIAGNOSIS — D631 Anemia in chronic kidney disease: Secondary | ICD-10-CM | POA: Diagnosis not present

## 2023-07-07 DIAGNOSIS — E1122 Type 2 diabetes mellitus with diabetic chronic kidney disease: Secondary | ICD-10-CM | POA: Diagnosis not present

## 2023-07-07 DIAGNOSIS — D509 Iron deficiency anemia, unspecified: Secondary | ICD-10-CM | POA: Diagnosis not present

## 2023-07-07 DIAGNOSIS — N2581 Secondary hyperparathyroidism of renal origin: Secondary | ICD-10-CM | POA: Diagnosis not present

## 2023-07-10 DIAGNOSIS — Z992 Dependence on renal dialysis: Secondary | ICD-10-CM | POA: Diagnosis not present

## 2023-07-10 DIAGNOSIS — D689 Coagulation defect, unspecified: Secondary | ICD-10-CM | POA: Diagnosis not present

## 2023-07-10 DIAGNOSIS — E1122 Type 2 diabetes mellitus with diabetic chronic kidney disease: Secondary | ICD-10-CM | POA: Diagnosis not present

## 2023-07-10 DIAGNOSIS — N186 End stage renal disease: Secondary | ICD-10-CM | POA: Diagnosis not present

## 2023-07-10 DIAGNOSIS — R52 Pain, unspecified: Secondary | ICD-10-CM | POA: Diagnosis not present

## 2023-07-10 DIAGNOSIS — D631 Anemia in chronic kidney disease: Secondary | ICD-10-CM | POA: Diagnosis not present

## 2023-07-10 DIAGNOSIS — D509 Iron deficiency anemia, unspecified: Secondary | ICD-10-CM | POA: Diagnosis not present

## 2023-07-10 DIAGNOSIS — N2581 Secondary hyperparathyroidism of renal origin: Secondary | ICD-10-CM | POA: Diagnosis not present

## 2023-07-13 DIAGNOSIS — D509 Iron deficiency anemia, unspecified: Secondary | ICD-10-CM | POA: Diagnosis not present

## 2023-07-13 DIAGNOSIS — R52 Pain, unspecified: Secondary | ICD-10-CM | POA: Diagnosis not present

## 2023-07-13 DIAGNOSIS — E1122 Type 2 diabetes mellitus with diabetic chronic kidney disease: Secondary | ICD-10-CM | POA: Diagnosis not present

## 2023-07-13 DIAGNOSIS — N186 End stage renal disease: Secondary | ICD-10-CM | POA: Diagnosis not present

## 2023-07-13 DIAGNOSIS — D689 Coagulation defect, unspecified: Secondary | ICD-10-CM | POA: Diagnosis not present

## 2023-07-13 DIAGNOSIS — D631 Anemia in chronic kidney disease: Secondary | ICD-10-CM | POA: Diagnosis not present

## 2023-07-13 DIAGNOSIS — Z992 Dependence on renal dialysis: Secondary | ICD-10-CM | POA: Diagnosis not present

## 2023-07-13 DIAGNOSIS — N2581 Secondary hyperparathyroidism of renal origin: Secondary | ICD-10-CM | POA: Diagnosis not present

## 2023-07-14 DIAGNOSIS — E1122 Type 2 diabetes mellitus with diabetic chronic kidney disease: Secondary | ICD-10-CM | POA: Diagnosis not present

## 2023-07-14 DIAGNOSIS — D631 Anemia in chronic kidney disease: Secondary | ICD-10-CM | POA: Diagnosis not present

## 2023-07-14 DIAGNOSIS — N186 End stage renal disease: Secondary | ICD-10-CM | POA: Diagnosis not present

## 2023-07-14 DIAGNOSIS — D689 Coagulation defect, unspecified: Secondary | ICD-10-CM | POA: Diagnosis not present

## 2023-07-14 DIAGNOSIS — Z992 Dependence on renal dialysis: Secondary | ICD-10-CM | POA: Diagnosis not present

## 2023-07-14 DIAGNOSIS — N2581 Secondary hyperparathyroidism of renal origin: Secondary | ICD-10-CM | POA: Diagnosis not present

## 2023-07-14 DIAGNOSIS — D509 Iron deficiency anemia, unspecified: Secondary | ICD-10-CM | POA: Diagnosis not present

## 2023-07-14 DIAGNOSIS — R52 Pain, unspecified: Secondary | ICD-10-CM | POA: Diagnosis not present

## 2023-07-17 DIAGNOSIS — D631 Anemia in chronic kidney disease: Secondary | ICD-10-CM | POA: Diagnosis not present

## 2023-07-17 DIAGNOSIS — N186 End stage renal disease: Secondary | ICD-10-CM | POA: Diagnosis not present

## 2023-07-17 DIAGNOSIS — R52 Pain, unspecified: Secondary | ICD-10-CM | POA: Diagnosis not present

## 2023-07-17 DIAGNOSIS — Z992 Dependence on renal dialysis: Secondary | ICD-10-CM | POA: Diagnosis not present

## 2023-07-17 DIAGNOSIS — N2581 Secondary hyperparathyroidism of renal origin: Secondary | ICD-10-CM | POA: Diagnosis not present

## 2023-07-17 DIAGNOSIS — E1122 Type 2 diabetes mellitus with diabetic chronic kidney disease: Secondary | ICD-10-CM | POA: Diagnosis not present

## 2023-07-17 DIAGNOSIS — D509 Iron deficiency anemia, unspecified: Secondary | ICD-10-CM | POA: Diagnosis not present

## 2023-07-17 DIAGNOSIS — D689 Coagulation defect, unspecified: Secondary | ICD-10-CM | POA: Diagnosis not present

## 2023-07-19 DIAGNOSIS — N2581 Secondary hyperparathyroidism of renal origin: Secondary | ICD-10-CM | POA: Diagnosis not present

## 2023-07-19 DIAGNOSIS — R52 Pain, unspecified: Secondary | ICD-10-CM | POA: Diagnosis not present

## 2023-07-19 DIAGNOSIS — E1122 Type 2 diabetes mellitus with diabetic chronic kidney disease: Secondary | ICD-10-CM | POA: Diagnosis not present

## 2023-07-19 DIAGNOSIS — D631 Anemia in chronic kidney disease: Secondary | ICD-10-CM | POA: Diagnosis not present

## 2023-07-19 DIAGNOSIS — D509 Iron deficiency anemia, unspecified: Secondary | ICD-10-CM | POA: Diagnosis not present

## 2023-07-19 DIAGNOSIS — D689 Coagulation defect, unspecified: Secondary | ICD-10-CM | POA: Diagnosis not present

## 2023-07-19 DIAGNOSIS — Z992 Dependence on renal dialysis: Secondary | ICD-10-CM | POA: Diagnosis not present

## 2023-07-19 DIAGNOSIS — N186 End stage renal disease: Secondary | ICD-10-CM | POA: Diagnosis not present

## 2023-07-21 DIAGNOSIS — D631 Anemia in chronic kidney disease: Secondary | ICD-10-CM | POA: Diagnosis not present

## 2023-07-21 DIAGNOSIS — D509 Iron deficiency anemia, unspecified: Secondary | ICD-10-CM | POA: Diagnosis not present

## 2023-07-21 DIAGNOSIS — D689 Coagulation defect, unspecified: Secondary | ICD-10-CM | POA: Diagnosis not present

## 2023-07-21 DIAGNOSIS — N2581 Secondary hyperparathyroidism of renal origin: Secondary | ICD-10-CM | POA: Diagnosis not present

## 2023-07-21 DIAGNOSIS — R52 Pain, unspecified: Secondary | ICD-10-CM | POA: Diagnosis not present

## 2023-07-21 DIAGNOSIS — Z992 Dependence on renal dialysis: Secondary | ICD-10-CM | POA: Diagnosis not present

## 2023-07-21 DIAGNOSIS — N186 End stage renal disease: Secondary | ICD-10-CM | POA: Diagnosis not present

## 2023-07-21 DIAGNOSIS — E1122 Type 2 diabetes mellitus with diabetic chronic kidney disease: Secondary | ICD-10-CM | POA: Diagnosis not present

## 2023-07-24 DIAGNOSIS — N186 End stage renal disease: Secondary | ICD-10-CM | POA: Diagnosis not present

## 2023-07-24 DIAGNOSIS — D631 Anemia in chronic kidney disease: Secondary | ICD-10-CM | POA: Diagnosis not present

## 2023-07-24 DIAGNOSIS — D509 Iron deficiency anemia, unspecified: Secondary | ICD-10-CM | POA: Diagnosis not present

## 2023-07-24 DIAGNOSIS — N2581 Secondary hyperparathyroidism of renal origin: Secondary | ICD-10-CM | POA: Diagnosis not present

## 2023-07-24 DIAGNOSIS — D689 Coagulation defect, unspecified: Secondary | ICD-10-CM | POA: Diagnosis not present

## 2023-07-24 DIAGNOSIS — R52 Pain, unspecified: Secondary | ICD-10-CM | POA: Diagnosis not present

## 2023-07-24 DIAGNOSIS — Z992 Dependence on renal dialysis: Secondary | ICD-10-CM | POA: Diagnosis not present

## 2023-07-24 DIAGNOSIS — E1122 Type 2 diabetes mellitus with diabetic chronic kidney disease: Secondary | ICD-10-CM | POA: Diagnosis not present

## 2023-07-26 DIAGNOSIS — D509 Iron deficiency anemia, unspecified: Secondary | ICD-10-CM | POA: Diagnosis not present

## 2023-07-26 DIAGNOSIS — E1122 Type 2 diabetes mellitus with diabetic chronic kidney disease: Secondary | ICD-10-CM | POA: Diagnosis not present

## 2023-07-26 DIAGNOSIS — Z992 Dependence on renal dialysis: Secondary | ICD-10-CM | POA: Diagnosis not present

## 2023-07-26 DIAGNOSIS — D689 Coagulation defect, unspecified: Secondary | ICD-10-CM | POA: Diagnosis not present

## 2023-07-26 DIAGNOSIS — R52 Pain, unspecified: Secondary | ICD-10-CM | POA: Diagnosis not present

## 2023-07-26 DIAGNOSIS — N186 End stage renal disease: Secondary | ICD-10-CM | POA: Diagnosis not present

## 2023-07-26 DIAGNOSIS — D631 Anemia in chronic kidney disease: Secondary | ICD-10-CM | POA: Diagnosis not present

## 2023-07-26 DIAGNOSIS — N2581 Secondary hyperparathyroidism of renal origin: Secondary | ICD-10-CM | POA: Diagnosis not present

## 2023-07-28 DIAGNOSIS — D689 Coagulation defect, unspecified: Secondary | ICD-10-CM | POA: Diagnosis not present

## 2023-07-28 DIAGNOSIS — Z992 Dependence on renal dialysis: Secondary | ICD-10-CM | POA: Diagnosis not present

## 2023-07-28 DIAGNOSIS — D509 Iron deficiency anemia, unspecified: Secondary | ICD-10-CM | POA: Diagnosis not present

## 2023-07-28 DIAGNOSIS — E1122 Type 2 diabetes mellitus with diabetic chronic kidney disease: Secondary | ICD-10-CM | POA: Diagnosis not present

## 2023-07-28 DIAGNOSIS — R52 Pain, unspecified: Secondary | ICD-10-CM | POA: Diagnosis not present

## 2023-07-28 DIAGNOSIS — D631 Anemia in chronic kidney disease: Secondary | ICD-10-CM | POA: Diagnosis not present

## 2023-07-28 DIAGNOSIS — N186 End stage renal disease: Secondary | ICD-10-CM | POA: Diagnosis not present

## 2023-07-28 DIAGNOSIS — N2581 Secondary hyperparathyroidism of renal origin: Secondary | ICD-10-CM | POA: Diagnosis not present

## 2023-07-31 DIAGNOSIS — N186 End stage renal disease: Secondary | ICD-10-CM | POA: Diagnosis not present

## 2023-07-31 DIAGNOSIS — N2581 Secondary hyperparathyroidism of renal origin: Secondary | ICD-10-CM | POA: Diagnosis not present

## 2023-07-31 DIAGNOSIS — Z992 Dependence on renal dialysis: Secondary | ICD-10-CM | POA: Diagnosis not present

## 2023-07-31 DIAGNOSIS — E1122 Type 2 diabetes mellitus with diabetic chronic kidney disease: Secondary | ICD-10-CM | POA: Diagnosis not present

## 2023-07-31 DIAGNOSIS — D689 Coagulation defect, unspecified: Secondary | ICD-10-CM | POA: Diagnosis not present

## 2023-08-02 DIAGNOSIS — E1122 Type 2 diabetes mellitus with diabetic chronic kidney disease: Secondary | ICD-10-CM | POA: Diagnosis not present

## 2023-08-02 DIAGNOSIS — N186 End stage renal disease: Secondary | ICD-10-CM | POA: Diagnosis not present

## 2023-08-02 DIAGNOSIS — N2581 Secondary hyperparathyroidism of renal origin: Secondary | ICD-10-CM | POA: Diagnosis not present

## 2023-08-02 DIAGNOSIS — D689 Coagulation defect, unspecified: Secondary | ICD-10-CM | POA: Diagnosis not present

## 2023-08-02 DIAGNOSIS — Z992 Dependence on renal dialysis: Secondary | ICD-10-CM | POA: Diagnosis not present

## 2023-08-04 DIAGNOSIS — E1122 Type 2 diabetes mellitus with diabetic chronic kidney disease: Secondary | ICD-10-CM | POA: Diagnosis not present

## 2023-08-04 DIAGNOSIS — N2581 Secondary hyperparathyroidism of renal origin: Secondary | ICD-10-CM | POA: Diagnosis not present

## 2023-08-04 DIAGNOSIS — Z992 Dependence on renal dialysis: Secondary | ICD-10-CM | POA: Diagnosis not present

## 2023-08-04 DIAGNOSIS — D689 Coagulation defect, unspecified: Secondary | ICD-10-CM | POA: Diagnosis not present

## 2023-08-04 DIAGNOSIS — N186 End stage renal disease: Secondary | ICD-10-CM | POA: Diagnosis not present

## 2023-08-07 DIAGNOSIS — Z992 Dependence on renal dialysis: Secondary | ICD-10-CM | POA: Diagnosis not present

## 2023-08-07 DIAGNOSIS — N2581 Secondary hyperparathyroidism of renal origin: Secondary | ICD-10-CM | POA: Diagnosis not present

## 2023-08-07 DIAGNOSIS — D689 Coagulation defect, unspecified: Secondary | ICD-10-CM | POA: Diagnosis not present

## 2023-08-07 DIAGNOSIS — E1122 Type 2 diabetes mellitus with diabetic chronic kidney disease: Secondary | ICD-10-CM | POA: Diagnosis not present

## 2023-08-07 DIAGNOSIS — N186 End stage renal disease: Secondary | ICD-10-CM | POA: Diagnosis not present

## 2023-08-09 DIAGNOSIS — E1122 Type 2 diabetes mellitus with diabetic chronic kidney disease: Secondary | ICD-10-CM | POA: Diagnosis not present

## 2023-08-09 DIAGNOSIS — Z992 Dependence on renal dialysis: Secondary | ICD-10-CM | POA: Diagnosis not present

## 2023-08-09 DIAGNOSIS — D689 Coagulation defect, unspecified: Secondary | ICD-10-CM | POA: Diagnosis not present

## 2023-08-09 DIAGNOSIS — N186 End stage renal disease: Secondary | ICD-10-CM | POA: Diagnosis not present

## 2023-08-09 DIAGNOSIS — N2581 Secondary hyperparathyroidism of renal origin: Secondary | ICD-10-CM | POA: Diagnosis not present

## 2023-08-11 DIAGNOSIS — N186 End stage renal disease: Secondary | ICD-10-CM | POA: Diagnosis not present

## 2023-08-11 DIAGNOSIS — D689 Coagulation defect, unspecified: Secondary | ICD-10-CM | POA: Diagnosis not present

## 2023-08-11 DIAGNOSIS — N2581 Secondary hyperparathyroidism of renal origin: Secondary | ICD-10-CM | POA: Diagnosis not present

## 2023-08-11 DIAGNOSIS — Z992 Dependence on renal dialysis: Secondary | ICD-10-CM | POA: Diagnosis not present

## 2023-08-11 DIAGNOSIS — E1122 Type 2 diabetes mellitus with diabetic chronic kidney disease: Secondary | ICD-10-CM | POA: Diagnosis not present

## 2023-08-14 DIAGNOSIS — Z992 Dependence on renal dialysis: Secondary | ICD-10-CM | POA: Diagnosis not present

## 2023-08-14 DIAGNOSIS — E1122 Type 2 diabetes mellitus with diabetic chronic kidney disease: Secondary | ICD-10-CM | POA: Diagnosis not present

## 2023-08-14 DIAGNOSIS — D689 Coagulation defect, unspecified: Secondary | ICD-10-CM | POA: Diagnosis not present

## 2023-08-14 DIAGNOSIS — N2581 Secondary hyperparathyroidism of renal origin: Secondary | ICD-10-CM | POA: Diagnosis not present

## 2023-08-14 DIAGNOSIS — N186 End stage renal disease: Secondary | ICD-10-CM | POA: Diagnosis not present

## 2023-08-16 DIAGNOSIS — N2581 Secondary hyperparathyroidism of renal origin: Secondary | ICD-10-CM | POA: Diagnosis not present

## 2023-08-16 DIAGNOSIS — D689 Coagulation defect, unspecified: Secondary | ICD-10-CM | POA: Diagnosis not present

## 2023-08-16 DIAGNOSIS — Z992 Dependence on renal dialysis: Secondary | ICD-10-CM | POA: Diagnosis not present

## 2023-08-16 DIAGNOSIS — N186 End stage renal disease: Secondary | ICD-10-CM | POA: Diagnosis not present

## 2023-08-16 DIAGNOSIS — E1122 Type 2 diabetes mellitus with diabetic chronic kidney disease: Secondary | ICD-10-CM | POA: Diagnosis not present

## 2023-08-18 DIAGNOSIS — N186 End stage renal disease: Secondary | ICD-10-CM | POA: Diagnosis not present

## 2023-08-18 DIAGNOSIS — Z992 Dependence on renal dialysis: Secondary | ICD-10-CM | POA: Diagnosis not present

## 2023-08-18 DIAGNOSIS — D689 Coagulation defect, unspecified: Secondary | ICD-10-CM | POA: Diagnosis not present

## 2023-08-18 DIAGNOSIS — N2581 Secondary hyperparathyroidism of renal origin: Secondary | ICD-10-CM | POA: Diagnosis not present

## 2023-08-18 DIAGNOSIS — E1122 Type 2 diabetes mellitus with diabetic chronic kidney disease: Secondary | ICD-10-CM | POA: Diagnosis not present

## 2023-08-21 DIAGNOSIS — E1122 Type 2 diabetes mellitus with diabetic chronic kidney disease: Secondary | ICD-10-CM | POA: Diagnosis not present

## 2023-08-21 DIAGNOSIS — Z992 Dependence on renal dialysis: Secondary | ICD-10-CM | POA: Diagnosis not present

## 2023-08-21 DIAGNOSIS — N2581 Secondary hyperparathyroidism of renal origin: Secondary | ICD-10-CM | POA: Diagnosis not present

## 2023-08-21 DIAGNOSIS — D689 Coagulation defect, unspecified: Secondary | ICD-10-CM | POA: Diagnosis not present

## 2023-08-21 DIAGNOSIS — N186 End stage renal disease: Secondary | ICD-10-CM | POA: Diagnosis not present

## 2023-08-22 ENCOUNTER — Other Ambulatory Visit: Payer: Self-pay | Admitting: Internal Medicine

## 2023-08-22 ENCOUNTER — Telehealth: Payer: Self-pay | Admitting: Internal Medicine

## 2023-08-22 DIAGNOSIS — G609 Hereditary and idiopathic neuropathy, unspecified: Secondary | ICD-10-CM

## 2023-08-22 MED ORDER — GABAPENTIN 300 MG PO CAPS
300.0000 mg | ORAL_CAPSULE | Freq: Every day | ORAL | 0 refills | Status: DC
Start: 2023-08-22 — End: 2023-10-01

## 2023-08-22 NOTE — Telephone Encounter (Signed)
Patient here needing refill on Gabapentin sent to [facility]. Please advise Thank you

## 2023-08-23 DIAGNOSIS — D689 Coagulation defect, unspecified: Secondary | ICD-10-CM | POA: Diagnosis not present

## 2023-08-23 DIAGNOSIS — N2581 Secondary hyperparathyroidism of renal origin: Secondary | ICD-10-CM | POA: Diagnosis not present

## 2023-08-23 DIAGNOSIS — Z992 Dependence on renal dialysis: Secondary | ICD-10-CM | POA: Diagnosis not present

## 2023-08-23 DIAGNOSIS — E1122 Type 2 diabetes mellitus with diabetic chronic kidney disease: Secondary | ICD-10-CM | POA: Diagnosis not present

## 2023-08-23 DIAGNOSIS — N186 End stage renal disease: Secondary | ICD-10-CM | POA: Diagnosis not present

## 2023-08-23 NOTE — Telephone Encounter (Signed)
Called patient phone number will not go through. If patient calls will schedule an in office visit.

## 2023-08-25 DIAGNOSIS — N2581 Secondary hyperparathyroidism of renal origin: Secondary | ICD-10-CM | POA: Diagnosis not present

## 2023-08-25 DIAGNOSIS — E1122 Type 2 diabetes mellitus with diabetic chronic kidney disease: Secondary | ICD-10-CM | POA: Diagnosis not present

## 2023-08-25 DIAGNOSIS — N186 End stage renal disease: Secondary | ICD-10-CM | POA: Diagnosis not present

## 2023-08-25 DIAGNOSIS — Z992 Dependence on renal dialysis: Secondary | ICD-10-CM | POA: Diagnosis not present

## 2023-08-25 DIAGNOSIS — D689 Coagulation defect, unspecified: Secondary | ICD-10-CM | POA: Diagnosis not present

## 2023-08-28 DIAGNOSIS — Z992 Dependence on renal dialysis: Secondary | ICD-10-CM | POA: Diagnosis not present

## 2023-08-28 DIAGNOSIS — E1122 Type 2 diabetes mellitus with diabetic chronic kidney disease: Secondary | ICD-10-CM | POA: Diagnosis not present

## 2023-08-28 DIAGNOSIS — N2581 Secondary hyperparathyroidism of renal origin: Secondary | ICD-10-CM | POA: Diagnosis not present

## 2023-08-28 DIAGNOSIS — D689 Coagulation defect, unspecified: Secondary | ICD-10-CM | POA: Diagnosis not present

## 2023-08-28 DIAGNOSIS — N186 End stage renal disease: Secondary | ICD-10-CM | POA: Diagnosis not present

## 2023-08-30 DIAGNOSIS — E1122 Type 2 diabetes mellitus with diabetic chronic kidney disease: Secondary | ICD-10-CM | POA: Diagnosis not present

## 2023-08-30 DIAGNOSIS — D689 Coagulation defect, unspecified: Secondary | ICD-10-CM | POA: Diagnosis not present

## 2023-08-30 DIAGNOSIS — Z992 Dependence on renal dialysis: Secondary | ICD-10-CM | POA: Diagnosis not present

## 2023-08-30 DIAGNOSIS — N2581 Secondary hyperparathyroidism of renal origin: Secondary | ICD-10-CM | POA: Diagnosis not present

## 2023-08-30 DIAGNOSIS — N186 End stage renal disease: Secondary | ICD-10-CM | POA: Diagnosis not present

## 2023-08-31 DIAGNOSIS — Z992 Dependence on renal dialysis: Secondary | ICD-10-CM | POA: Diagnosis not present

## 2023-08-31 DIAGNOSIS — N186 End stage renal disease: Secondary | ICD-10-CM | POA: Diagnosis not present

## 2023-09-01 DIAGNOSIS — E039 Hypothyroidism, unspecified: Secondary | ICD-10-CM | POA: Diagnosis not present

## 2023-09-01 DIAGNOSIS — D689 Coagulation defect, unspecified: Secondary | ICD-10-CM | POA: Diagnosis not present

## 2023-09-01 DIAGNOSIS — Z992 Dependence on renal dialysis: Secondary | ICD-10-CM | POA: Diagnosis not present

## 2023-09-01 DIAGNOSIS — D631 Anemia in chronic kidney disease: Secondary | ICD-10-CM | POA: Diagnosis not present

## 2023-09-01 DIAGNOSIS — N2581 Secondary hyperparathyroidism of renal origin: Secondary | ICD-10-CM | POA: Diagnosis not present

## 2023-09-01 DIAGNOSIS — N186 End stage renal disease: Secondary | ICD-10-CM | POA: Diagnosis not present

## 2023-09-01 DIAGNOSIS — E1122 Type 2 diabetes mellitus with diabetic chronic kidney disease: Secondary | ICD-10-CM | POA: Diagnosis not present

## 2023-09-04 DIAGNOSIS — D631 Anemia in chronic kidney disease: Secondary | ICD-10-CM | POA: Diagnosis not present

## 2023-09-04 DIAGNOSIS — N2581 Secondary hyperparathyroidism of renal origin: Secondary | ICD-10-CM | POA: Diagnosis not present

## 2023-09-04 DIAGNOSIS — N186 End stage renal disease: Secondary | ICD-10-CM | POA: Diagnosis not present

## 2023-09-04 DIAGNOSIS — E039 Hypothyroidism, unspecified: Secondary | ICD-10-CM | POA: Diagnosis not present

## 2023-09-04 DIAGNOSIS — D689 Coagulation defect, unspecified: Secondary | ICD-10-CM | POA: Diagnosis not present

## 2023-09-04 DIAGNOSIS — Z992 Dependence on renal dialysis: Secondary | ICD-10-CM | POA: Diagnosis not present

## 2023-09-04 DIAGNOSIS — E1122 Type 2 diabetes mellitus with diabetic chronic kidney disease: Secondary | ICD-10-CM | POA: Diagnosis not present

## 2023-09-06 DIAGNOSIS — D689 Coagulation defect, unspecified: Secondary | ICD-10-CM | POA: Diagnosis not present

## 2023-09-06 DIAGNOSIS — D631 Anemia in chronic kidney disease: Secondary | ICD-10-CM | POA: Diagnosis not present

## 2023-09-06 DIAGNOSIS — N186 End stage renal disease: Secondary | ICD-10-CM | POA: Diagnosis not present

## 2023-09-06 DIAGNOSIS — N2581 Secondary hyperparathyroidism of renal origin: Secondary | ICD-10-CM | POA: Diagnosis not present

## 2023-09-06 DIAGNOSIS — E1122 Type 2 diabetes mellitus with diabetic chronic kidney disease: Secondary | ICD-10-CM | POA: Diagnosis not present

## 2023-09-06 DIAGNOSIS — E039 Hypothyroidism, unspecified: Secondary | ICD-10-CM | POA: Diagnosis not present

## 2023-09-06 DIAGNOSIS — Z992 Dependence on renal dialysis: Secondary | ICD-10-CM | POA: Diagnosis not present

## 2023-09-08 DIAGNOSIS — D631 Anemia in chronic kidney disease: Secondary | ICD-10-CM | POA: Diagnosis not present

## 2023-09-08 DIAGNOSIS — E039 Hypothyroidism, unspecified: Secondary | ICD-10-CM | POA: Diagnosis not present

## 2023-09-08 DIAGNOSIS — N186 End stage renal disease: Secondary | ICD-10-CM | POA: Diagnosis not present

## 2023-09-08 DIAGNOSIS — D689 Coagulation defect, unspecified: Secondary | ICD-10-CM | POA: Diagnosis not present

## 2023-09-08 DIAGNOSIS — Z992 Dependence on renal dialysis: Secondary | ICD-10-CM | POA: Diagnosis not present

## 2023-09-08 DIAGNOSIS — E1122 Type 2 diabetes mellitus with diabetic chronic kidney disease: Secondary | ICD-10-CM | POA: Diagnosis not present

## 2023-09-08 DIAGNOSIS — N2581 Secondary hyperparathyroidism of renal origin: Secondary | ICD-10-CM | POA: Diagnosis not present

## 2023-09-11 DIAGNOSIS — Z992 Dependence on renal dialysis: Secondary | ICD-10-CM | POA: Diagnosis not present

## 2023-09-11 DIAGNOSIS — E1122 Type 2 diabetes mellitus with diabetic chronic kidney disease: Secondary | ICD-10-CM | POA: Diagnosis not present

## 2023-09-11 DIAGNOSIS — N2581 Secondary hyperparathyroidism of renal origin: Secondary | ICD-10-CM | POA: Diagnosis not present

## 2023-09-11 DIAGNOSIS — E039 Hypothyroidism, unspecified: Secondary | ICD-10-CM | POA: Diagnosis not present

## 2023-09-11 DIAGNOSIS — D631 Anemia in chronic kidney disease: Secondary | ICD-10-CM | POA: Diagnosis not present

## 2023-09-11 DIAGNOSIS — D689 Coagulation defect, unspecified: Secondary | ICD-10-CM | POA: Diagnosis not present

## 2023-09-11 DIAGNOSIS — N186 End stage renal disease: Secondary | ICD-10-CM | POA: Diagnosis not present

## 2023-09-13 DIAGNOSIS — N186 End stage renal disease: Secondary | ICD-10-CM | POA: Diagnosis not present

## 2023-09-13 DIAGNOSIS — E039 Hypothyroidism, unspecified: Secondary | ICD-10-CM | POA: Diagnosis not present

## 2023-09-13 DIAGNOSIS — D631 Anemia in chronic kidney disease: Secondary | ICD-10-CM | POA: Diagnosis not present

## 2023-09-13 DIAGNOSIS — D689 Coagulation defect, unspecified: Secondary | ICD-10-CM | POA: Diagnosis not present

## 2023-09-13 DIAGNOSIS — N2581 Secondary hyperparathyroidism of renal origin: Secondary | ICD-10-CM | POA: Diagnosis not present

## 2023-09-13 DIAGNOSIS — Z992 Dependence on renal dialysis: Secondary | ICD-10-CM | POA: Diagnosis not present

## 2023-09-13 DIAGNOSIS — E1122 Type 2 diabetes mellitus with diabetic chronic kidney disease: Secondary | ICD-10-CM | POA: Diagnosis not present

## 2023-09-15 DIAGNOSIS — Z992 Dependence on renal dialysis: Secondary | ICD-10-CM | POA: Diagnosis not present

## 2023-09-15 DIAGNOSIS — D631 Anemia in chronic kidney disease: Secondary | ICD-10-CM | POA: Diagnosis not present

## 2023-09-15 DIAGNOSIS — N186 End stage renal disease: Secondary | ICD-10-CM | POA: Diagnosis not present

## 2023-09-15 DIAGNOSIS — D689 Coagulation defect, unspecified: Secondary | ICD-10-CM | POA: Diagnosis not present

## 2023-09-15 DIAGNOSIS — N2581 Secondary hyperparathyroidism of renal origin: Secondary | ICD-10-CM | POA: Diagnosis not present

## 2023-09-15 DIAGNOSIS — E1122 Type 2 diabetes mellitus with diabetic chronic kidney disease: Secondary | ICD-10-CM | POA: Diagnosis not present

## 2023-09-15 DIAGNOSIS — E039 Hypothyroidism, unspecified: Secondary | ICD-10-CM | POA: Diagnosis not present

## 2023-09-18 ENCOUNTER — Ambulatory Visit: Payer: 59 | Admitting: Internal Medicine

## 2023-09-18 DIAGNOSIS — N2581 Secondary hyperparathyroidism of renal origin: Secondary | ICD-10-CM | POA: Diagnosis not present

## 2023-09-18 DIAGNOSIS — E1122 Type 2 diabetes mellitus with diabetic chronic kidney disease: Secondary | ICD-10-CM | POA: Diagnosis not present

## 2023-09-18 DIAGNOSIS — Z992 Dependence on renal dialysis: Secondary | ICD-10-CM | POA: Diagnosis not present

## 2023-09-18 DIAGNOSIS — D631 Anemia in chronic kidney disease: Secondary | ICD-10-CM | POA: Diagnosis not present

## 2023-09-18 DIAGNOSIS — E039 Hypothyroidism, unspecified: Secondary | ICD-10-CM | POA: Diagnosis not present

## 2023-09-18 DIAGNOSIS — N186 End stage renal disease: Secondary | ICD-10-CM | POA: Diagnosis not present

## 2023-09-18 DIAGNOSIS — D689 Coagulation defect, unspecified: Secondary | ICD-10-CM | POA: Diagnosis not present

## 2023-09-20 DIAGNOSIS — N2581 Secondary hyperparathyroidism of renal origin: Secondary | ICD-10-CM | POA: Diagnosis not present

## 2023-09-20 DIAGNOSIS — D689 Coagulation defect, unspecified: Secondary | ICD-10-CM | POA: Diagnosis not present

## 2023-09-20 DIAGNOSIS — D631 Anemia in chronic kidney disease: Secondary | ICD-10-CM | POA: Diagnosis not present

## 2023-09-20 DIAGNOSIS — E1122 Type 2 diabetes mellitus with diabetic chronic kidney disease: Secondary | ICD-10-CM | POA: Diagnosis not present

## 2023-09-20 DIAGNOSIS — E039 Hypothyroidism, unspecified: Secondary | ICD-10-CM | POA: Diagnosis not present

## 2023-09-20 DIAGNOSIS — N186 End stage renal disease: Secondary | ICD-10-CM | POA: Diagnosis not present

## 2023-09-20 DIAGNOSIS — Z992 Dependence on renal dialysis: Secondary | ICD-10-CM | POA: Diagnosis not present

## 2023-09-22 DIAGNOSIS — E1122 Type 2 diabetes mellitus with diabetic chronic kidney disease: Secondary | ICD-10-CM | POA: Diagnosis not present

## 2023-09-22 DIAGNOSIS — D631 Anemia in chronic kidney disease: Secondary | ICD-10-CM | POA: Diagnosis not present

## 2023-09-22 DIAGNOSIS — D689 Coagulation defect, unspecified: Secondary | ICD-10-CM | POA: Diagnosis not present

## 2023-09-22 DIAGNOSIS — N2581 Secondary hyperparathyroidism of renal origin: Secondary | ICD-10-CM | POA: Diagnosis not present

## 2023-09-22 DIAGNOSIS — E039 Hypothyroidism, unspecified: Secondary | ICD-10-CM | POA: Diagnosis not present

## 2023-09-22 DIAGNOSIS — Z992 Dependence on renal dialysis: Secondary | ICD-10-CM | POA: Diagnosis not present

## 2023-09-22 DIAGNOSIS — N186 End stage renal disease: Secondary | ICD-10-CM | POA: Diagnosis not present

## 2023-09-24 DIAGNOSIS — Z992 Dependence on renal dialysis: Secondary | ICD-10-CM | POA: Diagnosis not present

## 2023-09-24 DIAGNOSIS — D631 Anemia in chronic kidney disease: Secondary | ICD-10-CM | POA: Diagnosis not present

## 2023-09-24 DIAGNOSIS — N186 End stage renal disease: Secondary | ICD-10-CM | POA: Diagnosis not present

## 2023-09-24 DIAGNOSIS — N2581 Secondary hyperparathyroidism of renal origin: Secondary | ICD-10-CM | POA: Diagnosis not present

## 2023-09-24 DIAGNOSIS — E039 Hypothyroidism, unspecified: Secondary | ICD-10-CM | POA: Diagnosis not present

## 2023-09-24 DIAGNOSIS — E1122 Type 2 diabetes mellitus with diabetic chronic kidney disease: Secondary | ICD-10-CM | POA: Diagnosis not present

## 2023-09-24 DIAGNOSIS — D689 Coagulation defect, unspecified: Secondary | ICD-10-CM | POA: Diagnosis not present

## 2023-09-25 ENCOUNTER — Encounter: Payer: Self-pay | Admitting: Internal Medicine

## 2023-09-25 ENCOUNTER — Ambulatory Visit: Payer: 59 | Attending: Internal Medicine | Admitting: Internal Medicine

## 2023-09-25 NOTE — Progress Notes (Signed)
Erroneous encounter - please disregard.

## 2023-09-26 DIAGNOSIS — N2581 Secondary hyperparathyroidism of renal origin: Secondary | ICD-10-CM | POA: Diagnosis not present

## 2023-09-26 DIAGNOSIS — N186 End stage renal disease: Secondary | ICD-10-CM | POA: Diagnosis not present

## 2023-09-26 DIAGNOSIS — E1122 Type 2 diabetes mellitus with diabetic chronic kidney disease: Secondary | ICD-10-CM | POA: Diagnosis not present

## 2023-09-26 DIAGNOSIS — D689 Coagulation defect, unspecified: Secondary | ICD-10-CM | POA: Diagnosis not present

## 2023-09-26 DIAGNOSIS — E039 Hypothyroidism, unspecified: Secondary | ICD-10-CM | POA: Diagnosis not present

## 2023-09-26 DIAGNOSIS — Z992 Dependence on renal dialysis: Secondary | ICD-10-CM | POA: Diagnosis not present

## 2023-09-26 DIAGNOSIS — D631 Anemia in chronic kidney disease: Secondary | ICD-10-CM | POA: Diagnosis not present

## 2023-09-28 ENCOUNTER — Other Ambulatory Visit: Payer: Self-pay | Admitting: Internal Medicine

## 2023-09-28 DIAGNOSIS — G609 Hereditary and idiopathic neuropathy, unspecified: Secondary | ICD-10-CM

## 2023-09-29 DIAGNOSIS — Z992 Dependence on renal dialysis: Secondary | ICD-10-CM | POA: Diagnosis not present

## 2023-09-29 DIAGNOSIS — N186 End stage renal disease: Secondary | ICD-10-CM | POA: Diagnosis not present

## 2023-09-29 DIAGNOSIS — D689 Coagulation defect, unspecified: Secondary | ICD-10-CM | POA: Diagnosis not present

## 2023-09-29 DIAGNOSIS — D631 Anemia in chronic kidney disease: Secondary | ICD-10-CM | POA: Diagnosis not present

## 2023-09-29 DIAGNOSIS — E1122 Type 2 diabetes mellitus with diabetic chronic kidney disease: Secondary | ICD-10-CM | POA: Diagnosis not present

## 2023-09-29 DIAGNOSIS — E039 Hypothyroidism, unspecified: Secondary | ICD-10-CM | POA: Diagnosis not present

## 2023-09-29 DIAGNOSIS — N2581 Secondary hyperparathyroidism of renal origin: Secondary | ICD-10-CM | POA: Diagnosis not present

## 2023-09-30 DIAGNOSIS — N186 End stage renal disease: Secondary | ICD-10-CM | POA: Diagnosis not present

## 2023-09-30 DIAGNOSIS — Z992 Dependence on renal dialysis: Secondary | ICD-10-CM | POA: Diagnosis not present

## 2023-10-01 ENCOUNTER — Telehealth: Payer: Self-pay | Admitting: Internal Medicine

## 2023-10-01 ENCOUNTER — Other Ambulatory Visit: Payer: Self-pay

## 2023-10-01 DIAGNOSIS — G609 Hereditary and idiopathic neuropathy, unspecified: Secondary | ICD-10-CM

## 2023-10-01 MED ORDER — GABAPENTIN 300 MG PO CAPS
300.0000 mg | ORAL_CAPSULE | Freq: Every day | ORAL | 0 refills | Status: DC
Start: 2023-10-01 — End: 2024-02-04

## 2023-10-01 NOTE — Telephone Encounter (Signed)
Patient came by office requesting 90day supply of gabapentin (NEURONTIN) 300 MG capsule [[id number]]  Wants a call back , wants to know if he needs to pick up current script at pharm or wait for increased script   Phone : [phone]

## 2023-10-01 NOTE — Telephone Encounter (Signed)
Left voicemail for patient to contact office

## 2023-10-02 DIAGNOSIS — D689 Coagulation defect, unspecified: Secondary | ICD-10-CM | POA: Diagnosis not present

## 2023-10-02 DIAGNOSIS — Z992 Dependence on renal dialysis: Secondary | ICD-10-CM | POA: Diagnosis not present

## 2023-10-02 DIAGNOSIS — D631 Anemia in chronic kidney disease: Secondary | ICD-10-CM | POA: Diagnosis not present

## 2023-10-02 DIAGNOSIS — N2581 Secondary hyperparathyroidism of renal origin: Secondary | ICD-10-CM | POA: Diagnosis not present

## 2023-10-02 DIAGNOSIS — N186 End stage renal disease: Secondary | ICD-10-CM | POA: Diagnosis not present

## 2023-10-02 DIAGNOSIS — D509 Iron deficiency anemia, unspecified: Secondary | ICD-10-CM | POA: Diagnosis not present

## 2023-10-03 ENCOUNTER — Encounter: Payer: Self-pay | Admitting: Internal Medicine

## 2023-10-03 ENCOUNTER — Ambulatory Visit: Payer: 59 | Admitting: Internal Medicine

## 2023-10-03 VITALS — BP 158/86 | HR 66 | Wt 196.8 lb

## 2023-10-03 DIAGNOSIS — Z992 Dependence on renal dialysis: Secondary | ICD-10-CM | POA: Insufficient documentation

## 2023-10-03 DIAGNOSIS — M67441 Ganglion, right hand: Secondary | ICD-10-CM

## 2023-10-03 DIAGNOSIS — I10 Essential (primary) hypertension: Secondary | ICD-10-CM | POA: Diagnosis not present

## 2023-10-03 DIAGNOSIS — N186 End stage renal disease: Secondary | ICD-10-CM

## 2023-10-03 DIAGNOSIS — Z0001 Encounter for general adult medical examination with abnormal findings: Secondary | ICD-10-CM | POA: Diagnosis not present

## 2023-10-03 DIAGNOSIS — G609 Hereditary and idiopathic neuropathy, unspecified: Secondary | ICD-10-CM | POA: Diagnosis not present

## 2023-10-03 DIAGNOSIS — M71349 Other bursal cyst, unspecified hand: Secondary | ICD-10-CM | POA: Insufficient documentation

## 2023-10-03 DIAGNOSIS — I120 Hypertensive chronic kidney disease with stage 5 chronic kidney disease or end stage renal disease: Secondary | ICD-10-CM | POA: Diagnosis not present

## 2023-10-03 NOTE — Assessment & Plan Note (Signed)
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 Theador Hawthorne On calcitriol and sodium bicarb, had not been taking meds due to constipation, advised to stay compliant

## 2023-10-03 NOTE — Progress Notes (Unsigned)
Established Patient Office Visit  Subjective:  Patient ID: Charles [name], male    DOB: January 15, 1956  Age: 67 y.o. MRN: [id number]  CC:  Chief Complaint  Patient presents with   Cyst    Knot on hand between thumb and pointer finger    HPI Charles [name] is a 67 y.o. male with past medical history of HTN, CKD, BPH, anemia of chronic disease, prediabetes, chronic constipation and tobacco abuse who presents for annual physical.      Past Medical History:  Diagnosis Date   Anemia    Anxiety    Chronic back pain    Dementia    Nursing facility feels he has Dementia   Diabetes mellitus    Facial numbness    Fracture of right foot    Hypertension    Mental disorder    schizophrenia   Renal insufficiency     Past Surgical History:  Procedure Laterality Date   AV FISTULA PLACEMENT Left 12/20/2021   Procedure: LEFT ARM ARTERIOVENOUS (AV) FISTULA CREATION;  Surgeon: Larina Earthly, MD;  Location: [facility];  Service: Vascular;  Laterality: Left;   AV FISTULA PLACEMENT Left 03/13/2023   Procedure: INSERTION OF LEFT ARM ARTERIOVENOUS (AV) GORE-TEX GRAFT;  Surgeon: Larina Earthly, MD;  Location: [facility];  Service: Vascular;  Laterality: Left;   BIOPSY  08/02/2021   Procedure: BIOPSY;  Surgeon: Dolores Frame, MD;  Location: [facility];  Service: Gastroenterology;;  duodenal and gastric biopsies   COLONOSCOPY N/A 04/11/2018   Procedure: COLONOSCOPY;  Surgeon: Malissa Hippo, MD;  Location: [facility];  Service: Endoscopy;  Laterality: N/A;  10:30   COLONOSCOPY WITH PROPOFOL N/A 07/05/2018   Procedure: COLONOSCOPY WITH PROPOFOL;  Surgeon: Malissa Hippo, MD;  Location: [facility];  Service: Endoscopy;  Laterality: N/A;  10:35   COLONOSCOPY WITH PROPOFOL N/A 08/02/2021   Procedure: COLONOSCOPY WITH PROPOFOL;  Surgeon: Dolores Frame, MD;  Location: [facility];  Service: Gastroenterology;  Laterality: N/A;  9:05   ESOPHAGOGASTRODUODENOSCOPY (EGD) WITH PROPOFOL  N/A 08/02/2021   Procedure: ESOPHAGOGASTRODUODENOSCOPY (EGD) WITH PROPOFOL;  Surgeon: Dolores Frame, MD;  Location: [facility];  Service: Gastroenterology;  Laterality: N/A;   left 1st toe     ORIF TOE FRACTURE  09/05/2011   Procedure: OPEN REDUCTION INTERNAL FIXATION (ORIF) METATARSAL (TOE) FRACTURE;  Surgeon: Sherri Rad;  Location: [facility];  Service: Orthopedics;  Laterality: Left;  reconstruction left great toe FHB plantar plate   POLYPECTOMY  04/11/2018   Procedure: POLYPECTOMY;  Surgeon: Malissa Hippo, MD;  [facility]: [facility];  Service: Endoscopy;;  colon   POLYPECTOMY  07/05/2018   Procedure: POLYPECTOMY;  Surgeon: Malissa Hippo, MD;  [facility]: [facility];  Service: Endoscopy;;  colon   POLYPECTOMY  08/02/2021   Procedure: POLYPECTOMY;  Surgeon: Marguerita Merles, Reuel Boom, MD;  Location: [facility];  Service: Gastroenterology;;  transverse colon polyp x 1 ascending colon polyp   right foot suger      Family History  Problem Relation Age of Onset   Diabetes Brother    Colon cancer Neg Hx    Liver disease Neg Hx        unknown for sure, mom died at age 43    Social History   Socioeconomic History   Marital status: Single    Spouse name: Not on file   Number of children: Not on file   Years of  education: Not on file   Highest education level: Not on file  Occupational History   Not on file  Tobacco Use   Smoking status: Every Day    Current packs/day: 0.25    Average packs/day: 0.3 packs/day for 40.0 years (10.0 ttl pk-yrs)    Types: Cigarettes   Smokeless tobacco: Never   Tobacco comments:    Smoke   Vaping Use   Vaping status: Never Used  Substance and Sexual Activity   Alcohol use: Yes    Comment: every other day   Drug use: No   Sexual activity: Not on file  Other Topics Concern   Not on file  Social History Narrative   Not on file   Social Determinants of Health   Financial Resource Strain: Not on file  Food  Insecurity: No Food Insecurity (08/23/2022)   Hunger Vital Sign    Worried About Running Out of Food in the Last Year: Never true    Ran Out of Food in the Last Year: Never true  Transportation Needs: No Transportation Needs (08/23/2022)   PRAPARE - Administrator, Civil Service (Medical): No    Lack of Transportation (Non-Medical): No  Physical Activity: Not on file  Stress: Not on file  Social Connections: Not on file  Intimate Partner Violence: Not At Risk (08/23/2022)   Humiliation, Afraid, Rape, and Kick questionnaire    Fear of Current or Ex-Partner: No    Emotionally Abused: No    Physically Abused: No    Sexually Abused: No    Outpatient Medications Prior to Visit  Medication Sig Dispense Refill   acetaminophen (TYLENOL 8 HOUR) 650 MG CR tablet Take 1 tablet (650 mg total) by mouth every 8 (eight) hours as needed for pain or fever. (Patient not taking: Reported on 02/28/2023) 30 tablet 0   aspirin EC 81 MG tablet Take 1 tablet (81 mg total) by mouth daily with breakfast. (Patient not taking: Reported on 02/28/2023)     carvedilol (COREG) 25 MG tablet Take 25 mg by mouth 2 (two) times daily. (Patient not taking: Reported on 02/28/2023)     Cholecalciferol 25 MCG (1000 UT) capsule Take 5,000 Units by mouth daily. (Patient not taking: Reported on 02/28/2023)     diclofenac Sodium (VOLTAREN) 1 % GEL Apply 2 g topically 4 (four) times daily. (Patient not taking: Reported on 02/28/2023)     ferrous sulfate 325 (65 FE) MG tablet Take 325 mg by mouth daily with breakfast. (Patient not taking: Reported on 02/28/2023)     furosemide (LASIX) 20 MG tablet Take 40 mg by mouth daily. (Patient not taking: Reported on 02/28/2023)     gabapentin (NEURONTIN) 300 MG capsule Take 1 capsule (300 mg total) by mouth at bedtime. 90 capsule 0   ketoconazole (NIZORAL) 2 % cream Apply 1 Application topically daily. (Patient not taking: Reported on 02/28/2023) 15 g 0   NIFEdipine (PROCARDIA XL/NIFEDICAL XL) 60  MG 24 hr tablet Take 1 tablet (60 mg total) by mouth daily. (Patient not taking: Reported on 02/28/2023) 30 tablet 3   omeprazole (PRILOSEC) 40 MG capsule Take 1 capsule (40 mg total) by mouth 2 (two) times daily. (Patient not taking: Reported on 02/28/2023) 60 capsule 1   OVER THE COUNTER MEDICATION D 3 5,000 IU once per day. In addition to five 1,000 IU daily. (Patient not taking: Reported on 02/28/2023)     oxyCODONE-acetaminophen (PERCOCET) 5-325 MG tablet Take 1 tablet by mouth every 6 (six)  hours as needed for severe pain. 8 tablet 0   sodium bicarbonate 650 MG tablet Take 650 mg by mouth 3 (three) times daily. (Patient not taking: Reported on 02/28/2023)     tamsulosin (FLOMAX) 0.4 MG CAPS capsule Take 0.4 mg by mouth daily. (Patient not taking: Reported on 02/28/2023)     No facility-administered medications prior to visit.    No Known Allergies  ROS Review of Systems    Objective:    Physical Exam  BP (!) 155/86 (BP Location: Right Arm, Patient Position: Sitting, Cuff Size: Normal)   Pulse 66   Wt 196 lb 12.8 oz (89.3 kg)   SpO2 98%   BMI 28.24 kg/m  Wt Readings from Last 3 Encounters:  10/03/23 196 lb 12.8 oz (89.3 kg)  05/31/23 182 lb 15.7 oz (83 kg)  03/13/23 182 lb 15.7 oz (83 kg)    Lab Results  Component Value Date   TSH 1.089 ***Test methodology is 3rd generation TSH*** 06/27/2007   Lab Results  Component Value Date   WBC 4.7 05/31/2023   HGB 7.8 (L) 05/31/2023   HCT 25.8 (L) 05/31/2023   MCV 99.6 05/31/2023   PLT 221 05/31/2023   Lab Results  Component Value Date   NA 138 05/31/2023   K 3.5 05/31/2023   CO2 26 05/31/2023   GLUCOSE 116 (H) 05/31/2023   BUN 31 (H) 05/31/2023   CREATININE 5.60 (H) 05/31/2023   BILITOT 0.3 05/31/2023   ALKPHOS 69 05/31/2023   AST 14 (L) 05/31/2023   ALT 16 05/31/2023   PROT 7.1 05/31/2023   ALBUMIN 4.0 05/31/2023   CALCIUM 9.1 05/31/2023   ANIONGAP 12 05/31/2023   EGFR 11 (L) 10/09/2022   Lab Results  Component Value  Date   CHOL 179 08/23/2022   Lab Results  Component Value Date   HDL 62 08/23/2022   Lab Results  Component Value Date   LDLCALC 91 08/23/2022   Lab Results  Component Value Date   TRIG 129 08/23/2022   Lab Results  Component Value Date   CHOLHDL 2.9 08/23/2022   Lab Results  Component Value Date   HGBA1C 5.3 04/13/2022      Assessment & Plan:   Problem List Items Addressed This Visit   None   No orders of the defined types were placed in this encounter.   Follow-up: No follow-ups on file.    Anabel Halon, MD

## 2023-10-03 NOTE — Patient Instructions (Signed)
Please continue taking Gabapentin as prescribed for neuropathic pain.  You are being referred to Orthopedic surgeon for knot on the hand.  Please start taking Carvedilol as prescribed.  Please ask your Dialysis center about other medications. Start taking Magnesium 200 mg once daily for leg cramps.

## 2023-10-03 NOTE — Assessment & Plan Note (Signed)
Takes gabapentin 300 mg qHS, refilled

## 2023-10-04 ENCOUNTER — Encounter: Payer: Self-pay | Admitting: Internal Medicine

## 2023-10-04 ENCOUNTER — Telehealth: Payer: Self-pay | Admitting: Internal Medicine

## 2023-10-04 DIAGNOSIS — D689 Coagulation defect, unspecified: Secondary | ICD-10-CM | POA: Diagnosis not present

## 2023-10-04 DIAGNOSIS — N2581 Secondary hyperparathyroidism of renal origin: Secondary | ICD-10-CM | POA: Diagnosis not present

## 2023-10-04 DIAGNOSIS — Z992 Dependence on renal dialysis: Secondary | ICD-10-CM | POA: Diagnosis not present

## 2023-10-04 DIAGNOSIS — D631 Anemia in chronic kidney disease: Secondary | ICD-10-CM | POA: Diagnosis not present

## 2023-10-04 DIAGNOSIS — N186 End stage renal disease: Secondary | ICD-10-CM | POA: Diagnosis not present

## 2023-10-04 DIAGNOSIS — D509 Iron deficiency anemia, unspecified: Secondary | ICD-10-CM | POA: Diagnosis not present

## 2023-10-04 NOTE — Assessment & Plan Note (Signed)
BP Readings from Last 1 Encounters:  10/03/23 (!) 158/86   Uncontrolled due to noncompliance Followed by nephrology On Coreg 25 mg twice daily and nifedipine 60 mg daily - concern for missing doses and duplicating doses at times Counseled for compliance with the medications Advised to follow renal diet as suggested by HD center and ambulate as tolerated

## 2023-10-04 NOTE — Assessment & Plan Note (Addendum)
Physical exam as documented. Will try to obtain blood tests from HD center. Advised to get Shingrix and Tdap vaccines at local pharmacy.

## 2023-10-04 NOTE — Assessment & Plan Note (Signed)
Now on HD (TThSa) Needs to be compliant to his medications, needs to discuss with Nephrology for his antihypertensive regimen
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
## 2023-10-04 NOTE — Telephone Encounter (Signed)
Long term care services  Noted  Copied Sleeved  Original in PCP box Copy front desk folder

## 2023-10-05 NOTE — Telephone Encounter (Signed)
Called [facility] [name] left voicemail mail form ready for pick up.

## 2023-10-06 DIAGNOSIS — D509 Iron deficiency anemia, unspecified: Secondary | ICD-10-CM | POA: Diagnosis not present

## 2023-10-06 DIAGNOSIS — D689 Coagulation defect, unspecified: Secondary | ICD-10-CM | POA: Diagnosis not present

## 2023-10-06 DIAGNOSIS — Z992 Dependence on renal dialysis: Secondary | ICD-10-CM | POA: Diagnosis not present

## 2023-10-06 DIAGNOSIS — N2581 Secondary hyperparathyroidism of renal origin: Secondary | ICD-10-CM | POA: Diagnosis not present

## 2023-10-06 DIAGNOSIS — N186 End stage renal disease: Secondary | ICD-10-CM | POA: Diagnosis not present

## 2023-10-06 DIAGNOSIS — D631 Anemia in chronic kidney disease: Secondary | ICD-10-CM | POA: Diagnosis not present

## 2023-10-09 DIAGNOSIS — Z992 Dependence on renal dialysis: Secondary | ICD-10-CM | POA: Diagnosis not present

## 2023-10-09 DIAGNOSIS — N2581 Secondary hyperparathyroidism of renal origin: Secondary | ICD-10-CM | POA: Diagnosis not present

## 2023-10-09 DIAGNOSIS — D689 Coagulation defect, unspecified: Secondary | ICD-10-CM | POA: Diagnosis not present

## 2023-10-09 DIAGNOSIS — D631 Anemia in chronic kidney disease: Secondary | ICD-10-CM | POA: Diagnosis not present

## 2023-10-09 DIAGNOSIS — N186 End stage renal disease: Secondary | ICD-10-CM | POA: Diagnosis not present

## 2023-10-09 DIAGNOSIS — D509 Iron deficiency anemia, unspecified: Secondary | ICD-10-CM | POA: Diagnosis not present

## 2023-10-11 DIAGNOSIS — Z992 Dependence on renal dialysis: Secondary | ICD-10-CM | POA: Diagnosis not present

## 2023-10-11 DIAGNOSIS — D509 Iron deficiency anemia, unspecified: Secondary | ICD-10-CM | POA: Diagnosis not present

## 2023-10-11 DIAGNOSIS — D689 Coagulation defect, unspecified: Secondary | ICD-10-CM | POA: Diagnosis not present

## 2023-10-11 DIAGNOSIS — D631 Anemia in chronic kidney disease: Secondary | ICD-10-CM | POA: Diagnosis not present

## 2023-10-11 DIAGNOSIS — N2581 Secondary hyperparathyroidism of renal origin: Secondary | ICD-10-CM | POA: Diagnosis not present

## 2023-10-11 DIAGNOSIS — N186 End stage renal disease: Secondary | ICD-10-CM | POA: Diagnosis not present

## 2023-10-13 DIAGNOSIS — D509 Iron deficiency anemia, unspecified: Secondary | ICD-10-CM | POA: Diagnosis not present

## 2023-10-13 DIAGNOSIS — Z992 Dependence on renal dialysis: Secondary | ICD-10-CM | POA: Diagnosis not present

## 2023-10-13 DIAGNOSIS — D689 Coagulation defect, unspecified: Secondary | ICD-10-CM | POA: Diagnosis not present

## 2023-10-13 DIAGNOSIS — D631 Anemia in chronic kidney disease: Secondary | ICD-10-CM | POA: Diagnosis not present

## 2023-10-13 DIAGNOSIS — N2581 Secondary hyperparathyroidism of renal origin: Secondary | ICD-10-CM | POA: Diagnosis not present

## 2023-10-13 DIAGNOSIS — N186 End stage renal disease: Secondary | ICD-10-CM | POA: Diagnosis not present

## 2023-10-16 DIAGNOSIS — D509 Iron deficiency anemia, unspecified: Secondary | ICD-10-CM | POA: Diagnosis not present

## 2023-10-16 DIAGNOSIS — Z992 Dependence on renal dialysis: Secondary | ICD-10-CM | POA: Diagnosis not present

## 2023-10-16 DIAGNOSIS — N2581 Secondary hyperparathyroidism of renal origin: Secondary | ICD-10-CM | POA: Diagnosis not present

## 2023-10-16 DIAGNOSIS — D631 Anemia in chronic kidney disease: Secondary | ICD-10-CM | POA: Diagnosis not present

## 2023-10-16 DIAGNOSIS — D689 Coagulation defect, unspecified: Secondary | ICD-10-CM | POA: Diagnosis not present

## 2023-10-16 DIAGNOSIS — N186 End stage renal disease: Secondary | ICD-10-CM | POA: Diagnosis not present

## 2023-10-18 DIAGNOSIS — Z992 Dependence on renal dialysis: Secondary | ICD-10-CM | POA: Diagnosis not present

## 2023-10-18 DIAGNOSIS — D509 Iron deficiency anemia, unspecified: Secondary | ICD-10-CM | POA: Diagnosis not present

## 2023-10-18 DIAGNOSIS — D631 Anemia in chronic kidney disease: Secondary | ICD-10-CM | POA: Diagnosis not present

## 2023-10-18 DIAGNOSIS — N186 End stage renal disease: Secondary | ICD-10-CM | POA: Diagnosis not present

## 2023-10-18 DIAGNOSIS — D689 Coagulation defect, unspecified: Secondary | ICD-10-CM | POA: Diagnosis not present

## 2023-10-18 DIAGNOSIS — N2581 Secondary hyperparathyroidism of renal origin: Secondary | ICD-10-CM | POA: Diagnosis not present

## 2023-10-20 DIAGNOSIS — Z992 Dependence on renal dialysis: Secondary | ICD-10-CM | POA: Diagnosis not present

## 2023-10-20 DIAGNOSIS — N186 End stage renal disease: Secondary | ICD-10-CM | POA: Diagnosis not present

## 2023-10-20 DIAGNOSIS — D631 Anemia in chronic kidney disease: Secondary | ICD-10-CM | POA: Diagnosis not present

## 2023-10-20 DIAGNOSIS — D689 Coagulation defect, unspecified: Secondary | ICD-10-CM | POA: Diagnosis not present

## 2023-10-20 DIAGNOSIS — D509 Iron deficiency anemia, unspecified: Secondary | ICD-10-CM | POA: Diagnosis not present

## 2023-10-20 DIAGNOSIS — N2581 Secondary hyperparathyroidism of renal origin: Secondary | ICD-10-CM | POA: Diagnosis not present

## 2023-10-22 ENCOUNTER — Ambulatory Visit: Payer: 59 | Admitting: Orthopedic Surgery

## 2023-10-22 ENCOUNTER — Other Ambulatory Visit (INDEPENDENT_AMBULATORY_CARE_PROVIDER_SITE_OTHER): Payer: 59

## 2023-10-22 ENCOUNTER — Encounter: Payer: Self-pay | Admitting: Orthopedic Surgery

## 2023-10-22 VITALS — BP 140/93 | HR 82 | Ht 70.0 in | Wt 196.0 lb

## 2023-10-22 DIAGNOSIS — G8929 Other chronic pain: Secondary | ICD-10-CM

## 2023-10-22 DIAGNOSIS — D689 Coagulation defect, unspecified: Secondary | ICD-10-CM | POA: Diagnosis not present

## 2023-10-22 DIAGNOSIS — M79641 Pain in right hand: Secondary | ICD-10-CM | POA: Diagnosis not present

## 2023-10-22 DIAGNOSIS — N2581 Secondary hyperparathyroidism of renal origin: Secondary | ICD-10-CM | POA: Diagnosis not present

## 2023-10-22 DIAGNOSIS — R2231 Localized swelling, mass and lump, right upper limb: Secondary | ICD-10-CM

## 2023-10-22 DIAGNOSIS — Z992 Dependence on renal dialysis: Secondary | ICD-10-CM | POA: Diagnosis not present

## 2023-10-22 DIAGNOSIS — D509 Iron deficiency anemia, unspecified: Secondary | ICD-10-CM | POA: Diagnosis not present

## 2023-10-22 DIAGNOSIS — N186 End stage renal disease: Secondary | ICD-10-CM | POA: Diagnosis not present

## 2023-10-22 DIAGNOSIS — D631 Anemia in chronic kidney disease: Secondary | ICD-10-CM | POA: Diagnosis not present

## 2023-10-22 NOTE — Patient Instructions (Addendum)
We are referring you to [facility] from [facility] address is [address] [place] The phone number is [phone]  The office will call you with an appointment Dr. Fara Boros     For  MRI please go ahead and call to schedule your appointment with [facility] within at least one (1) week.   Central Scheduling [phone]
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
## 2023-10-25 DIAGNOSIS — Z992 Dependence on renal dialysis: Secondary | ICD-10-CM | POA: Diagnosis not present

## 2023-10-25 DIAGNOSIS — D509 Iron deficiency anemia, unspecified: Secondary | ICD-10-CM | POA: Diagnosis not present

## 2023-10-25 DIAGNOSIS — D631 Anemia in chronic kidney disease: Secondary | ICD-10-CM | POA: Diagnosis not present

## 2023-10-25 DIAGNOSIS — D689 Coagulation defect, unspecified: Secondary | ICD-10-CM | POA: Diagnosis not present

## 2023-10-25 DIAGNOSIS — N186 End stage renal disease: Secondary | ICD-10-CM | POA: Diagnosis not present

## 2023-10-25 DIAGNOSIS — N2581 Secondary hyperparathyroidism of renal origin: Secondary | ICD-10-CM | POA: Diagnosis not present

## 2023-10-27 ENCOUNTER — Ambulatory Visit (HOSPITAL_COMMUNITY)
Admission: RE | Admit: 2023-10-27 | Discharge: 2023-10-27 | Disposition: A | Discharge status: Home / Self Care | Payer: 59 | Source: Ambulatory Visit | Attending: Orthopedic Surgery | Admitting: Orthopedic Surgery

## 2023-10-27 DIAGNOSIS — G8929 Other chronic pain: Secondary | ICD-10-CM

## 2023-10-27 DIAGNOSIS — R2231 Localized swelling, mass and lump, right upper limb: Secondary | ICD-10-CM

## 2023-10-27 DIAGNOSIS — M19041 Primary osteoarthritis, right hand: Secondary | ICD-10-CM | POA: Diagnosis not present

## 2023-10-27 DIAGNOSIS — N2581 Secondary hyperparathyroidism of renal origin: Secondary | ICD-10-CM | POA: Diagnosis not present

## 2023-10-27 DIAGNOSIS — D631 Anemia in chronic kidney disease: Secondary | ICD-10-CM | POA: Diagnosis not present

## 2023-10-27 DIAGNOSIS — M79641 Pain in right hand: Secondary | ICD-10-CM | POA: Insufficient documentation

## 2023-10-27 DIAGNOSIS — Z992 Dependence on renal dialysis: Secondary | ICD-10-CM | POA: Diagnosis not present

## 2023-10-27 DIAGNOSIS — D509 Iron deficiency anemia, unspecified: Secondary | ICD-10-CM | POA: Diagnosis not present

## 2023-10-27 DIAGNOSIS — N186 End stage renal disease: Secondary | ICD-10-CM | POA: Diagnosis not present

## 2023-10-27 DIAGNOSIS — D689 Coagulation defect, unspecified: Secondary | ICD-10-CM | POA: Diagnosis not present

## 2023-10-29 DIAGNOSIS — D631 Anemia in chronic kidney disease: Secondary | ICD-10-CM | POA: Diagnosis not present

## 2023-10-29 DIAGNOSIS — N186 End stage renal disease: Secondary | ICD-10-CM | POA: Diagnosis not present

## 2023-10-29 DIAGNOSIS — N2581 Secondary hyperparathyroidism of renal origin: Secondary | ICD-10-CM | POA: Diagnosis not present

## 2023-10-29 DIAGNOSIS — D689 Coagulation defect, unspecified: Secondary | ICD-10-CM | POA: Diagnosis not present

## 2023-10-29 DIAGNOSIS — Z992 Dependence on renal dialysis: Secondary | ICD-10-CM | POA: Diagnosis not present

## 2023-10-29 DIAGNOSIS — D509 Iron deficiency anemia, unspecified: Secondary | ICD-10-CM | POA: Diagnosis not present

## 2023-11-02 ENCOUNTER — Encounter (HOSPITAL_COMMUNITY)
Admission: RE | Disposition: A | Discharge status: Home / Self Care | Payer: Self-pay | Source: Home / Self Care | Attending: Nephrology

## 2023-11-02 ENCOUNTER — Other Ambulatory Visit: Payer: Self-pay

## 2023-11-02 ENCOUNTER — Ambulatory Visit (HOSPITAL_COMMUNITY)
Admission: RE | Admit: 2023-11-02 | Discharge: 2023-11-02 | Disposition: A | Discharge status: Home / Self Care | Payer: 59 | Attending: Nephrology | Admitting: Nephrology

## 2023-11-02 DIAGNOSIS — Y832 Surgical operation with anastomosis, bypass or graft as the cause of abnormal reaction of the patient, or of later complication, without mention of misadventure at the time of the procedure: Secondary | ICD-10-CM | POA: Insufficient documentation

## 2023-11-02 DIAGNOSIS — D631 Anemia in chronic kidney disease: Secondary | ICD-10-CM | POA: Diagnosis not present

## 2023-11-02 DIAGNOSIS — Z992 Dependence on renal dialysis: Secondary | ICD-10-CM | POA: Insufficient documentation

## 2023-11-02 DIAGNOSIS — G8929 Other chronic pain: Secondary | ICD-10-CM | POA: Diagnosis not present

## 2023-11-02 DIAGNOSIS — F17210 Nicotine dependence, cigarettes, uncomplicated: Secondary | ICD-10-CM | POA: Diagnosis not present

## 2023-11-02 DIAGNOSIS — F419 Anxiety disorder, unspecified: Secondary | ICD-10-CM | POA: Diagnosis not present

## 2023-11-02 DIAGNOSIS — T82868A Thrombosis of vascular prosthetic devices, implants and grafts, initial encounter: Secondary | ICD-10-CM | POA: Diagnosis present

## 2023-11-02 DIAGNOSIS — I120 Hypertensive chronic kidney disease with stage 5 chronic kidney disease or end stage renal disease: Secondary | ICD-10-CM | POA: Insufficient documentation

## 2023-11-02 DIAGNOSIS — F32A Depression, unspecified: Secondary | ICD-10-CM | POA: Insufficient documentation

## 2023-11-02 DIAGNOSIS — F209 Schizophrenia, unspecified: Secondary | ICD-10-CM | POA: Diagnosis not present

## 2023-11-02 DIAGNOSIS — N186 End stage renal disease: Secondary | ICD-10-CM | POA: Insufficient documentation

## 2023-11-02 DIAGNOSIS — E1122 Type 2 diabetes mellitus with diabetic chronic kidney disease: Secondary | ICD-10-CM | POA: Insufficient documentation

## 2023-11-02 HISTORY — PX: PERIPHERAL VASCULAR THROMBECTOMY: CATH118306

## 2023-11-02 HISTORY — PX: PERIPHERAL VASCULAR BALLOON ANGIOPLASTY: CATH118281

## 2023-11-02 SURGERY — PERIPHERAL VASCULAR THROMBECTOMY
Anesthesia: LOCAL

## 2023-11-02 MED ORDER — HEPARIN SODIUM (PORCINE) 1000 UNIT/ML IJ SOLN
INTRAMUSCULAR | Status: DC | PRN
  Administered 2023-11-02: 5000 [IU] via INTRAVENOUS

## 2023-11-02 MED ORDER — IODIXANOL 320 MG/ML IV SOLN
INTRAVENOUS | Status: DC | PRN
  Administered 2023-11-02: 20 mL via INTRAVENOUS

## 2023-11-02 MED ORDER — SODIUM CHLORIDE 0.9 % IV SOLN: INTRAVENOUS | Status: DC

## 2023-11-02 MED ORDER — LIDOCAINE HCL (PF) 1 % IJ SOLN
INTRAMUSCULAR | Status: AC
Start: 2023-11-02 — End: ?
  Filled 2023-11-02: qty 30

## 2023-11-02 MED ORDER — FENTANYL CITRATE (PF) 100 MCG/2ML IJ SOLN
INTRAMUSCULAR | Status: DC | PRN
  Administered 2023-11-02: 25 ug via INTRAVENOUS

## 2023-11-02 MED ORDER — MIDAZOLAM HCL 2 MG/2ML IJ SOLN
INTRAMUSCULAR | Status: DC | PRN
  Administered 2023-11-02: 1 mg via INTRAVENOUS

## 2023-11-02 MED ORDER — FENTANYL CITRATE (PF) 100 MCG/2ML IJ SOLN
INTRAMUSCULAR | Status: AC
  Filled 2023-11-02: qty 2

## 2023-11-02 MED ORDER — HEPARIN (PORCINE) IN NACL 1000-0.9 UT/500ML-% IV SOLN
INTRAVENOUS | Status: DC | PRN
  Administered 2023-11-02: 500 mL

## 2023-11-02 MED ORDER — HEPARIN SODIUM (PORCINE) 1000 UNIT/ML IJ SOLN
INTRAMUSCULAR | Status: AC
Start: 2023-11-02 — End: ?
  Filled 2023-11-02: qty 10

## 2023-11-02 MED ORDER — MIDAZOLAM HCL 2 MG/2ML IJ SOLN
INTRAMUSCULAR | Status: AC
Start: 2023-11-02 — End: ?
  Filled 2023-11-02: qty 2

## 2023-11-02 MED ORDER — LIDOCAINE HCL (PF) 1 % IJ SOLN
INTRAMUSCULAR | Status: DC | PRN
  Administered 2023-11-02: 5 mL via INTRADERMAL

## 2023-11-02 SURGICAL SUPPLY — 11 items
BALLN MUSTANG 7X80X75 (BALLOONS) ×2
BALLOON FOGARTY 5FR 40 (CATHETERS) IMPLANT
BALLOON MUSTANG 7X80X75 (BALLOONS) IMPLANT
CATH FOGARTY BALL 5FR 40 (CATHETERS) ×2
CATH FOGARTY BALL 5FR 40CM (CATHETERS) ×2
CATH STR 7FR 55 BRITE (CATHETERS) IMPLANT
GUIDEWIRE ANGLED .035X150CM (WIRE) IMPLANT
SHEATH PINNACLE R/O II 7F 4CM (SHEATH) IMPLANT
SYR MEDALLION 10ML (SYRINGE) IMPLANT
TRAY PV CATH (CUSTOM PROCEDURE TRAY) IMPLANT
WIRE BENTSON .035X145CM (WIRE) IMPLANT

## 2023-11-02 NOTE — Discharge Instructions (Signed)

## 2023-11-02 NOTE — H&P (Signed)
 Charles [name] is an 68 y.o. male.   Chief Complaint: Thrombosed dialysis access HPI: 68 year old man with past medical history significant for hypertension, type 2 diabetes mellitus, chronic back pain, anxiety/depression, schizophrenia and end-stage renal disease on hemodialysis.  He has a left forearm loop graft that was created by Dr. Oris in May 2024 and this is his first endovascular procedure.  He went to dialysis yesterday and his graft was found to be thrombosed after uneventful dialysis earlier this week on Monday.  He denies any intradialytic hypotension and does not have any constitutional complaints suggestive of infection.  He denies any nausea, vomiting or diarrhea.  The procedure was explained to the patient and he is willing to proceed.  Past Medical History:  Diagnosis Date   Anemia    Anxiety    Chronic back pain    Dementia    Nursing facility feels he has Dementia   Diabetes mellitus    Facial numbness    Fracture of right foot    Hypertension    Mental disorder    schizophrenia   Renal insufficiency     Past Surgical History:  Procedure Laterality Date   AV FISTULA PLACEMENT Left 12/20/2021   Procedure: LEFT ARM ARTERIOVENOUS (AV) FISTULA CREATION;  Surgeon: Oris Krystal FALCON, MD;  Location: [facility];  Service: Vascular;  Laterality: Left;   AV FISTULA PLACEMENT Left 03/13/2023   Procedure: INSERTION OF LEFT ARM ARTERIOVENOUS (AV) GORE-TEX GRAFT;  Surgeon: Oris Krystal FALCON, MD;  Location: [facility];  Service: Vascular;  Laterality: Left;   BIOPSY  08/02/2021   Procedure: BIOPSY;  Surgeon: Eartha Angelia Sieving, MD;  [facility];  Service: Gastroenterology;;  duodenal and gastric biopsies   COLONOSCOPY N/A 04/11/2018   Procedure: COLONOSCOPY;  Surgeon: Golda Claudis PENNER, MD;  [facility];  Service: Endoscopy;  Laterality: N/A;  10:30   COLONOSCOPY WITH PROPOFOL  N/A 07/05/2018   Procedure: COLONOSCOPY WITH PROPOFOL ;  Surgeon: Golda Claudis PENNER, MD;   [facility];  Service: Endoscopy;  Laterality: N/A;  10:35   COLONOSCOPY WITH PROPOFOL  N/A 08/02/2021   Procedure: COLONOSCOPY WITH PROPOFOL ;  Surgeon: Eartha Angelia Sieving, MD;  [facility];  Service: Gastroenterology;  Laterality: N/A;  9:05   ESOPHAGOGASTRODUODENOSCOPY (EGD) WITH PROPOFOL  N/A 08/02/2021   Procedure: ESOPHAGOGASTRODUODENOSCOPY (EGD) WITH PROPOFOL ;  Surgeon: Eartha Angelia Sieving, MD;  [facility];  Service: Gastroenterology;  Laterality: N/A;   left 1st toe     ORIF TOE FRACTURE  09/05/2011   Procedure: OPEN REDUCTION INTERNAL FIXATION (ORIF) METATARSAL (TOE) FRACTURE;  Surgeon: Deward DELENA Schwartz;  [facility];  Service: Orthopedics;  Laterality: Left;  reconstruction left great toe FHB plantar plate   POLYPECTOMY  04/11/2018   Procedure: POLYPECTOMY;  Surgeon: Golda Claudis PENNER, MD;  [facility];  Service: Endoscopy;;  colon   POLYPECTOMY  07/05/2018   Procedure: POLYPECTOMY;  Surgeon: Golda Claudis PENNER, MD;  [facility];  Service: Endoscopy;;  colon   POLYPECTOMY  08/02/2021   Procedure: POLYPECTOMY;  Surgeon: Eartha Angelia, Sieving, MD;  [facility];  Service: Gastroenterology;;  transverse colon polyp x 1 ascending colon polyp   right foot suger      Family History  Problem Relation Age of Onset   Diabetes Brother    Colon cancer Neg Hx    Liver disease Neg Hx        unknown for sure, mom died  at age 13   Social History:  reports that he has been smoking cigarettes. He has a 10 pack-year smoking history. He has never used smokeless tobacco. He reports current alcohol use. He reports that he does not use drugs.  Allergies: No Known Allergies  Medications Prior to Admission  Medication Sig Dispense Refill   aspirin  EC 81 MG tablet Take 1 tablet (81 mg total) by mouth daily with breakfast. (Patient not taking: Reported on 02/28/2023)     AURYXIA 1 GM 210 MG(Fe) tablet Take  by mouth. (Patient not taking: Reported on 10/22/2023)     carvedilol  (COREG ) 25 MG tablet Take 25 mg by mouth 2 (two) times daily. (Patient not taking: Reported on 02/28/2023)     Cholecalciferol  25 MCG (1000 UT) capsule Take 5,000 Units by mouth daily. (Patient not taking: Reported on 02/28/2023)     ferrous sulfate  325 (65 FE) MG tablet Take 325 mg by mouth daily with breakfast. (Patient not taking: Reported on 02/28/2023)     furosemide (LASIX) 20 MG tablet Take 40 mg by mouth daily. (Patient not taking: Reported on 02/28/2023)     gabapentin  (NEURONTIN ) 300 MG capsule Take 1 capsule (300 mg total) by mouth at bedtime. 90 capsule 0   Methoxy PEG-Epoetin  Beta (MIRCERA IJ) Mircera (Patient not taking: Reported on 10/22/2023)     NIFEdipine  (PROCARDIA  XL/NIFEDICAL XL) 60 MG 24 hr tablet Take 1 tablet (60 mg total) by mouth daily. (Patient not taking: Reported on 10/22/2023) 30 tablet 3   omeprazole  (PRILOSEC) 40 MG capsule Take 1 capsule (40 mg total) by mouth 2 (two) times daily. (Patient not taking: Reported on 10/22/2023) 60 capsule 1   OVER THE COUNTER MEDICATION D 3 5,000 IU once per day. In addition to five 1,000 IU daily. (Patient not taking: Reported on 02/28/2023)     oxyCODONE -acetaminophen  (PERCOCET) 5-325 MG tablet Take 1 tablet by mouth every 6 (six) hours as needed for severe pain. (Patient not taking: Reported on 10/22/2023) 8 tablet 0   sodium bicarbonate  650 MG tablet Take 650 mg by mouth 3 (three) times daily. (Patient not taking: Reported on 02/28/2023)     tamsulosin (FLOMAX) 0.4 MG CAPS capsule Take 0.4 mg by mouth daily. (Patient not taking: Reported on 02/28/2023)      No results found for this or any previous visit (from the past 48 hours). No results found.  Review of Systems  All other systems reviewed and are negative.   Blood pressure (!) 169/105, pulse 96, resp. rate 16, SpO2 98%. Physical Exam Vitals reviewed.  Constitutional:      General: He is not in acute distress.     Appearance: Normal appearance. He is obese. He is not toxic-appearing.  HENT:     Head: Normocephalic and atraumatic.     Right Ear: External ear normal.     Left Ear: External ear normal.     Nose: Nose normal.     Mouth/Throat:     Mouth: Mucous membranes are dry.     Pharynx: Oropharynx is clear.  Eyes:     Conjunctiva/sclera: Conjunctivae normal.  Cardiovascular:     Rate and Rhythm: Normal rate and regular rhythm.     Pulses: Normal pulses.     Heart sounds: Normal heart sounds.  Pulmonary:     Effort: Pulmonary effort is normal.     Breath sounds: Normal breath sounds. No wheezing or rales.  Abdominal:     General: Abdomen is flat. Bowel sounds  are normal.     Palpations: Abdomen is soft.  Musculoskeletal:     Cervical back: Normal range of motion and neck supple.     Right lower leg: No edema.     Left lower leg: No edema.     Comments: Left forearm loop graft without thrill or bruit  Skin:    General: Skin is warm and dry.  Neurological:     Mental Status: He is alert and oriented to person, place, and time.  Psychiatric:        Mood and Affect: Mood normal.      Assessment/Plan 1.  Thrombosed left forearm loop graft: No clear precipitating event and thrombectomy will be attempted today to restore patency to resume dialysis. 2.  End-stage renal disease: Resume dialysis tomorrow after reestablishing dialysis access patency today. 3.  Hypertension: Blood pressure elevated, monitor with sedation during procedure and resume antihypertensive therapy/control with dialysis. 4.  Anemia: Resume ESA protocol at dialysis.  Gordy MARLA Blanch, MD 11/02/2023, 10:38 AM

## 2023-11-02 NOTE — Op Note (Signed)
 Indication: Patient referred for a thrombectomy of his left forearm AVG placed by Dr. Oris on Mar 13, 2023 in the standard configuration with the arterial limb medial.   This is his first procedure on this current access.  On examination there is no pulse or bruit in the left forearm loop graft. The left radial pulse is 1+.   Interventional nephrologist: Dr. Lynwood Farr Assistant: Dr. 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 2) Arterial anastomosis and inflow graft segments are patent; however, there may be an neointimal defect at the arterial juxta segment.  We tried repeat embolectomy balloon passes as well as gentle balloon inflation with very little change in angiographic/fluoroscopic visualization.  Flows were very rapid but if he thrombosis and under 1 month may need more aggressive therapy next time such as stent grafting at that level. 3) Patent outflow brachial vein, axillary vein and central veins. 4) This left forearm loop graft remains amenable to future percutaneous intervention.  Description of procedure: The left arm was prepped and draped in the usual fashion. The left forearm loop AVG was first cannulated (63094) in the arterial limb in an antegrade direction and then a 7Fr sheath was inserted by guidewire exchange technique. A 0.035 guidewire was passed through the clotted graft into the central veins under fluoroscopic guidance and then a 5Fr diagnostic catheter inserted over the wire. The wire was removed, and then intravenous heparin  and sedative drugs administered. A pullback angiogram was performed by injecting contrast (V0032) via the diagnostic catheter. This showed patent central veins, patent left axillary vein, 50-70% venous anastomosis stenosis and evidence of filling defects in the graft consistent with thrombus.   Three  passes were made with the 7Fr [facility] 1 catheter from the brachial vein to the antegrade sheath to physically remove thrombus and perform the thrombectomy (63094). The catheter was removed, and a 7 x 8 Mustang angioplasty balloon was inserted over the wire to the level of the mid-intragraft lesion followed by venous angioplasty (63094) carried out to 18ATM at only the site of stenosis to FULL effacement and then more gently in the rest of the graft circuit to macerate the thrombus.    In order to remove this clot and remove the platelet plug at the arterial anastomosis, a second cannulation (63094) was required in a retrograde direction. A 7Fr sheath was inserted by guidewire exchange technique. A 4Fr Fogarty catheter was inserted through the sheath and advanced into the proximal brachial artery. The over the wire embolectomy balloon was inflated with 0.6cc of contrast and then pulled across the arterial anastomosis into the graft with aspiration of the sheath via the side port to remove the platelet plug and thrombus. [phone]). This step was repeated to sweep all residual clot from this side of the graft. In order to check the arterial inflow an arteriogram was necessary because refluxing contrast from the graft would have caused risk of embolizing thrombus from the graft into the arterial vasculature. A glidewire and straight catheter were inserted into the proximal brachial artery and an arteriogram performed by parking the tip of the catheter 2cm proximal to the arterial anastomosis. The arteriogram documented a good calibered proximal and distal brachial artery with slow distal runoff and no evidence of distal emboli. Follow up angiogram showed good blood flow through the graft circuit with some evidence of retained  thrombus within the body of the graft. I then polished the entire outflow tract/graft circuit with the partially inflated 7 mm angioplasty balloon to alleviate thrombus burden.  Of note there was a  hyperopacity at the arterial juxta level which we treated with multiple passes of the embolectomy balloon as well as gentle balloon lymph inflation.  Unfortunately the opacity was still present despite all these therapies but flows were very rapid.  Completion outflow angiogram revealed rapid flows, 10% residual stenosis at the distal anastomosis with no dissection or extravasation. The graft had a good thrill and bruit.   Hemostasis: A 3-0 ethilon purse string suture was placed at the cannulation site on removal of the sheath.  Sedation: 1 mg Versed , 25 mcg Fentanyl . Sedation time. 35  minutes  Contrast. 20 mL  Monitoring: Because of the patient's comorbid conditions and sedation during the procedure, continuous EKG monitoring and O2 saturation monitoring was performed throughout the procedure by the RN. There were no abnormal arrhythmias encountered.  Complications: None.   Diagnoses: N18.6 End stage renal disease  T82.858A Stenosis of vascular prosthetic devices, implants and grafts, initial encounter T82.868A Thrombosis of vascular prosthetic device/graft, initial  Procedure Coding:  [phone] Cannulation and angiogram of fistula/ graft, thrombectomy and venous angioplasty V0032 Contrast  Recommendations:  1. Continue to cannulate the graft with 15G needles.  2. Refer back for problems with flows. 3. Remove the sutures next treatment.   Discharge: The patient was discharged home in stable condition. The patient was given education regarding the care of the dialysis access AVF and specific instructions in case of any problems.

## 2023-11-05 ENCOUNTER — Encounter (HOSPITAL_COMMUNITY): Payer: Self-pay | Admitting: Nephrology

## 2023-11-23 ENCOUNTER — Emergency Department (HOSPITAL_COMMUNITY)
Admission: EM | Admit: 2023-11-23 | Discharge: 2023-11-23 | Disposition: A | Discharge status: Home / Self Care | Payer: 59 | Attending: Emergency Medicine | Admitting: Emergency Medicine

## 2023-11-23 ENCOUNTER — Other Ambulatory Visit: Payer: Self-pay

## 2023-11-23 ENCOUNTER — Encounter (HOSPITAL_COMMUNITY): Payer: Self-pay

## 2023-11-23 ENCOUNTER — Emergency Department (HOSPITAL_COMMUNITY): Payer: 59

## 2023-11-23 DIAGNOSIS — Z992 Dependence on renal dialysis: Secondary | ICD-10-CM | POA: Diagnosis not present

## 2023-11-23 DIAGNOSIS — M542 Cervicalgia: Secondary | ICD-10-CM | POA: Insufficient documentation

## 2023-11-23 DIAGNOSIS — S0240FA Zygomatic fracture, left side, initial encounter for closed fracture: Secondary | ICD-10-CM | POA: Diagnosis not present

## 2023-11-23 DIAGNOSIS — S0240DA Maxillary fracture, left side, initial encounter for closed fracture: Secondary | ICD-10-CM | POA: Insufficient documentation

## 2023-11-23 DIAGNOSIS — S02401A Maxillary fracture, unspecified, initial encounter for closed fracture: Secondary | ICD-10-CM

## 2023-11-23 DIAGNOSIS — S0990XA Unspecified injury of head, initial encounter: Secondary | ICD-10-CM | POA: Diagnosis present

## 2023-11-23 DIAGNOSIS — Z7982 Long term (current) use of aspirin: Secondary | ICD-10-CM | POA: Insufficient documentation

## 2023-11-23 MED ORDER — OXYCODONE-ACETAMINOPHEN 5-325 MG PO TABS
1.0000 | ORAL_TABLET | Freq: Once | ORAL | Status: AC
  Administered 2023-11-23: 1 via ORAL
  Filled 2023-11-23: qty 1

## 2023-11-23 MED ORDER — NIFEDIPINE ER OSMOTIC RELEASE 30 MG PO TB24
60.0000 mg | ORAL_TABLET | Freq: Once | ORAL | Status: AC
  Administered 2023-11-23: 60 mg via ORAL
  Filled 2023-11-23: qty 2

## 2023-11-23 MED ORDER — OXYCODONE-ACETAMINOPHEN 5-325 MG PO TABS: 1.0000 | ORAL_TABLET | ORAL | 0 refills | Status: DC | PRN

## 2023-11-23 MED ORDER — TOBRAMYCIN 0.3 % OP OINT: TOPICAL_OINTMENT | Freq: Two times a day (BID) | OPHTHALMIC | 0 refills | Status: AC

## 2023-11-23 MED ORDER — NIFEDIPINE ER OSMOTIC RELEASE 60 MG PO TB24: 60.0000 mg | ORAL_TABLET | Freq: Every day | ORAL | 3 refills | Status: DC
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
## 2023-11-23 NOTE — Discharge Instructions (Addendum)
Ice to area of swelling
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
## 2023-11-26 ENCOUNTER — Telehealth (INDEPENDENT_AMBULATORY_CARE_PROVIDER_SITE_OTHER): Payer: Self-pay | Admitting: Otolaryngology

## 2023-11-26 NOTE — Telephone Encounter (Signed)
   ENT ED F/U to Dr Allena Katz

## 2023-11-26 NOTE — Telephone Encounter (Signed)
Called to schedule ED follow up, left vm for patient to return call.

## 2023-12-03 ENCOUNTER — Ambulatory Visit (INDEPENDENT_AMBULATORY_CARE_PROVIDER_SITE_OTHER): Payer: 59 | Admitting: Orthopedic Surgery

## 2023-12-03 DIAGNOSIS — G8929 Other chronic pain: Secondary | ICD-10-CM

## 2023-12-03 DIAGNOSIS — R2231 Localized swelling, mass and lump, right upper limb: Secondary | ICD-10-CM | POA: Diagnosis not present

## 2023-12-03 DIAGNOSIS — M79641 Pain in right hand: Secondary | ICD-10-CM
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
## 2024-01-01 ENCOUNTER — Encounter: Payer: 59 | Admitting: Physical Medicine and Rehabilitation

## 2024-01-04 ENCOUNTER — Ambulatory Visit (INDEPENDENT_AMBULATORY_CARE_PROVIDER_SITE_OTHER): Admitting: Physical Medicine and Rehabilitation

## 2024-01-04 DIAGNOSIS — R531 Weakness: Secondary | ICD-10-CM

## 2024-01-04 DIAGNOSIS — R202 Paresthesia of skin: Secondary | ICD-10-CM

## 2024-01-04 DIAGNOSIS — G8929 Other chronic pain: Secondary | ICD-10-CM

## 2024-01-04 DIAGNOSIS — M79641 Pain in right hand: Secondary | ICD-10-CM

## 2024-01-04 DIAGNOSIS — R2231 Localized swelling, mass and lump, right upper limb: Secondary | ICD-10-CM | POA: Diagnosis not present

## 2024-01-04 NOTE — Procedures (Signed)
 EMG & NCV Findings: Evaluation of the right median motor nerve showed prolonged distal onset latency (4.4 ms), reduced amplitude (3.7 mV), and decreased conduction velocity (Elbow-Wrist, 45 m/s).  The right ulnar motor nerve showed decreased conduction velocity (B Elbow-Wrist, 42 m/s) and decreased conduction velocity (A Elbow-B Elbow, 52 m/s).  The right median (across palm) sensory nerve showed no response (Palm) and reduced amplitude (9.4 V).  The right ulnar sensory nerve showed reduced amplitude (10.4 V).  All remaining nerves (as indicated in the following tables) were within normal limits.    Needle evaluation of the right abductor pollicis brevis and the right first dorsal interosseous muscles showed increased insertional activity.  All remaining muscles (as indicated in the following table) showed no evidence of electrical instability.    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   **There is also evidence highly suggestive of underlying peripheral polyneuropathy.  This seems to be sensory and axonal demyelinating.  Recommendations: 1.  Follow-up with referring physician.  2.  Continue current management of symptoms. 3.  Suggest surgical evaluation for mass and median neuropathy at the wrist.  Primary care physician or neurology for further workup of possible neuropathy although he already carries this diagnosis.  ___________________________ Naaman Plummer FAAPMR Board Certified, American Board of Physical Medicine and Rehabilitation    Nerve Conduction Studies Anti Sensory Summary Table   Stim Site NR Peak (ms) Norm Peak (ms) P-T Amp (V) Norm P-T Amp Site1 Site2 Delta-P (ms) Dist (cm) Vel (m/s) Norm Vel (m/s)  Right Median Acr Palm Anti Sensory (2nd Digit)  29.7C  Wrist    1.9 <3.6 *9.4 >10 Wrist Palm  0.0     Palm *NR  <2.0          Right Radial Anti Sensory (Base 1st Digit)  30.6C  Wrist    2.6 <3.1 20.5  Wrist Base 1st Digit 2.6 0.0    Right Ulnar Anti Sensory (5th Digit)  30.9C  Wrist    3.4 <3.7 *10.4 >15.0 Wrist 5th Digit 3.4 14.0 41 >38   Motor Summary Table   Stim Site NR Onset (ms) Norm Onset (ms) O-P Amp (mV) Norm O-P Amp Site1 Site2 Delta-0 (ms) Dist (cm) Vel (m/s) Norm Vel (m/s)  Right Median Motor (Abd Poll Brev)  31.9C  Wrist    *4.4 <4.2 *3.7 >5 Elbow Wrist 5.8 26.0 *45 >50  Elbow    10.2  2.9         Right Ulnar Motor (Abd Dig Min)  32.2C  Wrist    3.8 <4.2 7.1 >3 B Elbow Wrist 5.7 24.0 *42 >53  B Elbow    9.5  4.2  A Elbow B Elbow 2.1 11.0 *52 >53  A Elbow    11.6  5.2          EMG   Side Muscle Nerve Root Ins Act Fibs Psw Amp Dur Poly Recrt Int Dennie Bible Comment  Right Abd Poll Brev Median C8-T1 *CRD Nml Nml Nml Nml 0 Nml Nml   Right 1stDorInt Ulnar C8-T1 *CRD Nml Nml Nml Nml 0 Nml Nml   Right PronatorTeres Median C6-7 Nml Nml Nml Nml Nml 0 Nml Nml   Right Biceps Musculocut C5-6 Nml Nml Nml Nml Nml 0 Nml Nml     Nerve Conduction Studies Anti Sensory Left/Right Comparison   Stim Site L Lat (  ms) R Lat (ms) L-R Lat (ms) L Amp (V) R Amp (V) L-R Amp (%) Site1 Site2 L Vel (m/s) R Vel (m/s) L-R Vel (m/s)  Median Acr Palm Anti Sensory (2nd Digit)  29.7C  Wrist  1.9   *9.4  Wrist Palm     Palm             Radial Anti Sensory (Base 1st Digit)  30.6C  Wrist  2.6   20.5  Wrist Base 1st Digit     Ulnar Anti Sensory (5th Digit)  30.9C  Wrist  3.4   *10.4  Wrist 5th Digit  41    Motor Left/Right Comparison   Stim Site L Lat (ms) R Lat (ms) L-R Lat (ms) L Amp (mV) R Amp (mV) L-R Amp (%) Site1 Site2 L Vel (m/s) R Vel (m/s) L-R Vel (m/s)  Median Motor (Abd Poll Brev)  31.9C  Wrist  *4.4   *3.7  Elbow Wrist  *45   Elbow  10.2   2.9        Ulnar Motor (Abd Dig Min)  32.2C  Wrist  3.8   7.1  B Elbow Wrist  *42   B Elbow  9.5   4.2  A Elbow B Elbow  *52   A Elbow  11.6    5.2           Waveforms:
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
## 2024-01-04 NOTE — Progress Notes (Signed)
 Pain Scale   Average Pain 6 Right hand Dominant Patient advised that he has numbness and tingling

## 2024-01-07 ENCOUNTER — Telehealth: Payer: Self-pay | Admitting: Orthopedic Surgery

## 2024-01-07 NOTE — Telephone Encounter (Signed)
 LVM for patient to schedule follow up for the EMG. We can add him on/ double book over a spot to review this. Does not need to wait until April.

## 2024-01-08 ENCOUNTER — Encounter: Payer: Self-pay | Admitting: Physical Medicine and Rehabilitation

## 2024-01-29 DIAGNOSIS — R197 Diarrhea, unspecified: Secondary | ICD-10-CM | POA: Diagnosis not present

## 2024-01-29 DIAGNOSIS — Z992 Dependence on renal dialysis: Secondary | ICD-10-CM | POA: Diagnosis not present

## 2024-01-29 DIAGNOSIS — D631 Anemia in chronic kidney disease: Secondary | ICD-10-CM | POA: Diagnosis not present

## 2024-01-29 DIAGNOSIS — E1122 Type 2 diabetes mellitus with diabetic chronic kidney disease: Secondary | ICD-10-CM | POA: Diagnosis not present

## 2024-01-29 DIAGNOSIS — R52 Pain, unspecified: Secondary | ICD-10-CM | POA: Diagnosis not present

## 2024-01-29 DIAGNOSIS — D689 Coagulation defect, unspecified: Secondary | ICD-10-CM | POA: Diagnosis not present

## 2024-01-29 DIAGNOSIS — N186 End stage renal disease: Secondary | ICD-10-CM | POA: Diagnosis not present

## 2024-01-29 DIAGNOSIS — N2581 Secondary hyperparathyroidism of renal origin: Secondary | ICD-10-CM | POA: Diagnosis not present

## 2024-01-29 DIAGNOSIS — D509 Iron deficiency anemia, unspecified: Secondary | ICD-10-CM | POA: Diagnosis not present

## 2024-01-31 DIAGNOSIS — Z992 Dependence on renal dialysis: Secondary | ICD-10-CM | POA: Diagnosis not present

## 2024-01-31 DIAGNOSIS — R197 Diarrhea, unspecified: Secondary | ICD-10-CM | POA: Diagnosis not present

## 2024-01-31 DIAGNOSIS — D689 Coagulation defect, unspecified: Secondary | ICD-10-CM | POA: Diagnosis not present

## 2024-01-31 DIAGNOSIS — D631 Anemia in chronic kidney disease: Secondary | ICD-10-CM | POA: Diagnosis not present

## 2024-01-31 DIAGNOSIS — R52 Pain, unspecified: Secondary | ICD-10-CM | POA: Diagnosis not present

## 2024-01-31 DIAGNOSIS — E1122 Type 2 diabetes mellitus with diabetic chronic kidney disease: Secondary | ICD-10-CM | POA: Diagnosis not present

## 2024-01-31 DIAGNOSIS — N2581 Secondary hyperparathyroidism of renal origin: Secondary | ICD-10-CM | POA: Diagnosis not present

## 2024-01-31 DIAGNOSIS — D509 Iron deficiency anemia, unspecified: Secondary | ICD-10-CM | POA: Diagnosis not present

## 2024-01-31 DIAGNOSIS — N186 End stage renal disease: Secondary | ICD-10-CM | POA: Diagnosis not present

## 2024-02-02 DIAGNOSIS — N186 End stage renal disease: Secondary | ICD-10-CM | POA: Diagnosis not present

## 2024-02-02 DIAGNOSIS — E1122 Type 2 diabetes mellitus with diabetic chronic kidney disease: Secondary | ICD-10-CM | POA: Diagnosis not present

## 2024-02-02 DIAGNOSIS — D631 Anemia in chronic kidney disease: Secondary | ICD-10-CM | POA: Diagnosis not present

## 2024-02-02 DIAGNOSIS — Z992 Dependence on renal dialysis: Secondary | ICD-10-CM | POA: Diagnosis not present

## 2024-02-02 DIAGNOSIS — R197 Diarrhea, unspecified: Secondary | ICD-10-CM | POA: Diagnosis not present

## 2024-02-02 DIAGNOSIS — D689 Coagulation defect, unspecified: Secondary | ICD-10-CM | POA: Diagnosis not present

## 2024-02-02 DIAGNOSIS — R52 Pain, unspecified: Secondary | ICD-10-CM | POA: Diagnosis not present

## 2024-02-02 DIAGNOSIS — N2581 Secondary hyperparathyroidism of renal origin: Secondary | ICD-10-CM | POA: Diagnosis not present

## 2024-02-02 DIAGNOSIS — D509 Iron deficiency anemia, unspecified: Secondary | ICD-10-CM | POA: Diagnosis not present

## 2024-02-04 ENCOUNTER — Encounter: Payer: Self-pay | Admitting: Internal Medicine

## 2024-02-04 ENCOUNTER — Ambulatory Visit (INDEPENDENT_AMBULATORY_CARE_PROVIDER_SITE_OTHER): Payer: 59 | Admitting: Internal Medicine

## 2024-02-04 VITALS — BP 138/84 | HR 98 | Ht 70.0 in | Wt 202.4 lb

## 2024-02-04 DIAGNOSIS — N186 End stage renal disease: Secondary | ICD-10-CM

## 2024-02-04 DIAGNOSIS — I10 Essential (primary) hypertension: Secondary | ICD-10-CM

## 2024-02-04 DIAGNOSIS — Z992 Dependence on renal dialysis: Secondary | ICD-10-CM | POA: Diagnosis not present

## 2024-02-04 DIAGNOSIS — R7303 Prediabetes: Secondary | ICD-10-CM | POA: Diagnosis not present

## 2024-02-04 DIAGNOSIS — M71349 Other bursal cyst, unspecified hand: Secondary | ICD-10-CM

## 2024-02-04 DIAGNOSIS — N2581 Secondary hyperparathyroidism of renal origin: Secondary | ICD-10-CM | POA: Diagnosis not present

## 2024-02-04 DIAGNOSIS — G609 Hereditary and idiopathic neuropathy, unspecified: Secondary | ICD-10-CM

## 2024-02-04 MED ORDER — GABAPENTIN 300 MG PO CAPS: 300.0000 mg | ORAL_CAPSULE | Freq: Every day | ORAL | 3 refills | Status: DC

## 2024-02-04 MED ORDER — CARVEDILOL 6.25 MG PO TABS: 6.2500 mg | ORAL_TABLET | Freq: Two times a day (BID) | ORAL | 3 refills | Status: DC

## 2024-02-04 NOTE — Assessment & Plan Note (Signed)
 HbA1C: 5.0 now Has h/o DM in the past, but has been prediabetic range since starting HD Continue to follow low carb diet

## 2024-02-04 NOTE — Assessment & Plan Note (Signed)
 Now on HD (TThSa) Needs to be compliant to his medications, needs to discuss with Nephrology for his antihypertensive regimen

## 2024-02-04 NOTE — Assessment & Plan Note (Signed)
Takes gabapentin 300 mg qHS, refilled
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
## 2024-02-04 NOTE — Assessment & Plan Note (Addendum)
 Has ESRD, on HD Needs to follow up with Nephrology and be compliant with his medications - Xphozah for hyperphosphatemia

## 2024-02-04 NOTE — Assessment & Plan Note (Addendum)
 BP Readings from Last 1 Encounters:  02/04/24 (!) 142/78   Uncontrolled due to noncompliance Followed by nephrology Was on Coreg 25 mg twice daily and nifedipine 60 mg daily - concern for missing doses and duplicating doses at times - continue Nifedipine 60 mg once daily, sent Coreg at a lower dose - 6.25 mg BID Counseled for compliance with the medications Advised to follow renal diet as suggested by HD center and ambulate as tolerated

## 2024-02-04 NOTE — Patient Instructions (Addendum)
 Please start taking Carvedilol as prescribed.  Please continue to take medications as prescribed.  Please continue to follow low carb diet and ambulate as tolerated.  Dr Denese Killings [address], [place], [place] [address] Phone: [phone]

## 2024-02-05 ENCOUNTER — Telehealth: Payer: Self-pay | Admitting: Orthopedic Surgery

## 2024-02-05 DIAGNOSIS — R52 Pain, unspecified: Secondary | ICD-10-CM | POA: Diagnosis not present

## 2024-02-05 DIAGNOSIS — N186 End stage renal disease: Secondary | ICD-10-CM | POA: Diagnosis not present

## 2024-02-05 DIAGNOSIS — D689 Coagulation defect, unspecified: Secondary | ICD-10-CM | POA: Diagnosis not present

## 2024-02-05 DIAGNOSIS — E1122 Type 2 diabetes mellitus with diabetic chronic kidney disease: Secondary | ICD-10-CM | POA: Diagnosis not present

## 2024-02-05 DIAGNOSIS — N2581 Secondary hyperparathyroidism of renal origin: Secondary | ICD-10-CM | POA: Diagnosis not present

## 2024-02-05 DIAGNOSIS — D631 Anemia in chronic kidney disease: Secondary | ICD-10-CM | POA: Diagnosis not present

## 2024-02-05 DIAGNOSIS — Z992 Dependence on renal dialysis: Secondary | ICD-10-CM | POA: Diagnosis not present

## 2024-02-05 DIAGNOSIS — D509 Iron deficiency anemia, unspecified: Secondary | ICD-10-CM | POA: Diagnosis not present

## 2024-02-05 DIAGNOSIS — R197 Diarrhea, unspecified: Secondary | ICD-10-CM | POA: Diagnosis not present

## 2024-02-05 NOTE — Telephone Encounter (Signed)
 Patient has called my number repeatedly requesting a call to discuss scheduling his hand surgery. I do not have a surgery sheet on him. Please advise.

## 2024-02-05 NOTE — Telephone Encounter (Signed)
 Attempted to call patient again for appointment for EMG. No answer, so left message for him. We need to see him back in office for EMG follow up prior to setting up for surgery.

## 2024-02-06 LAB — BAYER DCA HB A1C WAIVED: HB A1C (BAYER DCA - WAIVED): 5 % (ref 4.8–5.6)

## 2024-02-06 NOTE — Telephone Encounter (Signed)
 April B stated patient has called 2x this morning. Reached out to patient, and got him scheduled for next Tuesday at 830 to go over EMG

## 2024-02-07 DIAGNOSIS — E1122 Type 2 diabetes mellitus with diabetic chronic kidney disease: Secondary | ICD-10-CM | POA: Diagnosis not present

## 2024-02-07 DIAGNOSIS — D509 Iron deficiency anemia, unspecified: Secondary | ICD-10-CM | POA: Diagnosis not present

## 2024-02-07 DIAGNOSIS — R197 Diarrhea, unspecified: Secondary | ICD-10-CM | POA: Diagnosis not present

## 2024-02-07 DIAGNOSIS — N2581 Secondary hyperparathyroidism of renal origin: Secondary | ICD-10-CM | POA: Diagnosis not present

## 2024-02-07 DIAGNOSIS — N186 End stage renal disease: Secondary | ICD-10-CM | POA: Diagnosis not present

## 2024-02-07 DIAGNOSIS — D689 Coagulation defect, unspecified: Secondary | ICD-10-CM | POA: Diagnosis not present

## 2024-02-07 DIAGNOSIS — D631 Anemia in chronic kidney disease: Secondary | ICD-10-CM | POA: Diagnosis not present

## 2024-02-07 DIAGNOSIS — Z992 Dependence on renal dialysis: Secondary | ICD-10-CM | POA: Diagnosis not present

## 2024-02-07 DIAGNOSIS — R52 Pain, unspecified: Secondary | ICD-10-CM | POA: Diagnosis not present

## 2024-02-09 DIAGNOSIS — R52 Pain, unspecified: Secondary | ICD-10-CM | POA: Diagnosis not present

## 2024-02-09 DIAGNOSIS — N2581 Secondary hyperparathyroidism of renal origin: Secondary | ICD-10-CM | POA: Diagnosis not present

## 2024-02-09 DIAGNOSIS — E1122 Type 2 diabetes mellitus with diabetic chronic kidney disease: Secondary | ICD-10-CM | POA: Diagnosis not present

## 2024-02-09 DIAGNOSIS — Z992 Dependence on renal dialysis: Secondary | ICD-10-CM | POA: Diagnosis not present

## 2024-02-09 DIAGNOSIS — N186 End stage renal disease: Secondary | ICD-10-CM | POA: Diagnosis not present

## 2024-02-09 DIAGNOSIS — D509 Iron deficiency anemia, unspecified: Secondary | ICD-10-CM | POA: Diagnosis not present

## 2024-02-09 DIAGNOSIS — D631 Anemia in chronic kidney disease: Secondary | ICD-10-CM | POA: Diagnosis not present

## 2024-02-09 DIAGNOSIS — D689 Coagulation defect, unspecified: Secondary | ICD-10-CM | POA: Diagnosis not present

## 2024-02-09 DIAGNOSIS — R197 Diarrhea, unspecified: Secondary | ICD-10-CM | POA: Diagnosis not present

## 2024-02-12 ENCOUNTER — Ambulatory Visit: Admitting: Orthopedic Surgery

## 2024-02-12 DIAGNOSIS — G5601 Carpal tunnel syndrome, right upper limb: Secondary | ICD-10-CM | POA: Diagnosis not present

## 2024-02-12 DIAGNOSIS — R2231 Localized swelling, mass and lump, right upper limb: Secondary | ICD-10-CM | POA: Diagnosis not present

## 2024-02-12 MED ORDER — LIDOCAINE HCL 1 % IJ SOLN
1.0000 mL | INTRAMUSCULAR | Status: AC | PRN
Start: 2024-02-12 — End: 2024-02-12
  Administered 2024-02-12: 1 mL

## 2024-02-12 MED ORDER — BETAMETHASONE SOD PHOS & ACET 6 (3-3) MG/ML IJ SUSP
6.0000 mg | INTRAMUSCULAR | Status: AC | PRN
  Administered 2024-02-12: 6 mg via INTRA_ARTICULAR
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
## 2024-02-14 DIAGNOSIS — R197 Diarrhea, unspecified: Secondary | ICD-10-CM | POA: Diagnosis not present

## 2024-02-14 DIAGNOSIS — N2581 Secondary hyperparathyroidism of renal origin: Secondary | ICD-10-CM | POA: Diagnosis not present

## 2024-02-14 DIAGNOSIS — E1122 Type 2 diabetes mellitus with diabetic chronic kidney disease: Secondary | ICD-10-CM | POA: Diagnosis not present

## 2024-02-14 DIAGNOSIS — Z992 Dependence on renal dialysis: Secondary | ICD-10-CM | POA: Diagnosis not present

## 2024-02-14 DIAGNOSIS — D509 Iron deficiency anemia, unspecified: Secondary | ICD-10-CM | POA: Diagnosis not present

## 2024-02-14 DIAGNOSIS — R52 Pain, unspecified: Secondary | ICD-10-CM | POA: Diagnosis not present

## 2024-02-14 DIAGNOSIS — D631 Anemia in chronic kidney disease: Secondary | ICD-10-CM | POA: Diagnosis not present

## 2024-02-14 DIAGNOSIS — D689 Coagulation defect, unspecified: Secondary | ICD-10-CM | POA: Diagnosis not present

## 2024-02-14 DIAGNOSIS — N186 End stage renal disease: Secondary | ICD-10-CM | POA: Diagnosis not present

## 2024-02-16 DIAGNOSIS — E1122 Type 2 diabetes mellitus with diabetic chronic kidney disease: Secondary | ICD-10-CM | POA: Diagnosis not present

## 2024-02-16 DIAGNOSIS — R52 Pain, unspecified: Secondary | ICD-10-CM | POA: Diagnosis not present

## 2024-02-16 DIAGNOSIS — N2581 Secondary hyperparathyroidism of renal origin: Secondary | ICD-10-CM | POA: Diagnosis not present

## 2024-02-16 DIAGNOSIS — Z992 Dependence on renal dialysis: Secondary | ICD-10-CM | POA: Diagnosis not present

## 2024-02-16 DIAGNOSIS — D509 Iron deficiency anemia, unspecified: Secondary | ICD-10-CM | POA: Diagnosis not present

## 2024-02-16 DIAGNOSIS — D689 Coagulation defect, unspecified: Secondary | ICD-10-CM | POA: Diagnosis not present

## 2024-02-16 DIAGNOSIS — R197 Diarrhea, unspecified: Secondary | ICD-10-CM | POA: Diagnosis not present

## 2024-02-16 DIAGNOSIS — N186 End stage renal disease: Secondary | ICD-10-CM | POA: Diagnosis not present

## 2024-02-16 DIAGNOSIS — D631 Anemia in chronic kidney disease: Secondary | ICD-10-CM | POA: Diagnosis not present

## 2024-02-19 DIAGNOSIS — D689 Coagulation defect, unspecified: Secondary | ICD-10-CM | POA: Diagnosis not present

## 2024-02-19 DIAGNOSIS — R197 Diarrhea, unspecified: Secondary | ICD-10-CM | POA: Diagnosis not present

## 2024-02-19 DIAGNOSIS — Z992 Dependence on renal dialysis: Secondary | ICD-10-CM | POA: Diagnosis not present

## 2024-02-19 DIAGNOSIS — N186 End stage renal disease: Secondary | ICD-10-CM | POA: Diagnosis not present

## 2024-02-19 DIAGNOSIS — N2581 Secondary hyperparathyroidism of renal origin: Secondary | ICD-10-CM | POA: Diagnosis not present

## 2024-02-19 DIAGNOSIS — D631 Anemia in chronic kidney disease: Secondary | ICD-10-CM | POA: Diagnosis not present

## 2024-02-19 DIAGNOSIS — D509 Iron deficiency anemia, unspecified: Secondary | ICD-10-CM | POA: Diagnosis not present

## 2024-02-19 DIAGNOSIS — R52 Pain, unspecified: Secondary | ICD-10-CM | POA: Diagnosis not present

## 2024-02-19 DIAGNOSIS — E1122 Type 2 diabetes mellitus with diabetic chronic kidney disease: Secondary | ICD-10-CM | POA: Diagnosis not present

## 2024-02-21 DIAGNOSIS — N186 End stage renal disease: Secondary | ICD-10-CM | POA: Diagnosis not present

## 2024-02-21 DIAGNOSIS — R52 Pain, unspecified: Secondary | ICD-10-CM | POA: Diagnosis not present

## 2024-02-21 DIAGNOSIS — E1122 Type 2 diabetes mellitus with diabetic chronic kidney disease: Secondary | ICD-10-CM | POA: Diagnosis not present

## 2024-02-21 DIAGNOSIS — D631 Anemia in chronic kidney disease: Secondary | ICD-10-CM | POA: Diagnosis not present

## 2024-02-21 DIAGNOSIS — D689 Coagulation defect, unspecified: Secondary | ICD-10-CM | POA: Diagnosis not present

## 2024-02-21 DIAGNOSIS — D509 Iron deficiency anemia, unspecified: Secondary | ICD-10-CM | POA: Diagnosis not present

## 2024-02-21 DIAGNOSIS — R197 Diarrhea, unspecified: Secondary | ICD-10-CM | POA: Diagnosis not present

## 2024-02-21 DIAGNOSIS — Z992 Dependence on renal dialysis: Secondary | ICD-10-CM | POA: Diagnosis not present

## 2024-02-21 DIAGNOSIS — N2581 Secondary hyperparathyroidism of renal origin: Secondary | ICD-10-CM | POA: Diagnosis not present

## 2024-02-23 DIAGNOSIS — R52 Pain, unspecified: Secondary | ICD-10-CM | POA: Diagnosis not present

## 2024-02-23 DIAGNOSIS — D631 Anemia in chronic kidney disease: Secondary | ICD-10-CM | POA: Diagnosis not present

## 2024-02-23 DIAGNOSIS — N2581 Secondary hyperparathyroidism of renal origin: Secondary | ICD-10-CM | POA: Diagnosis not present

## 2024-02-23 DIAGNOSIS — D509 Iron deficiency anemia, unspecified: Secondary | ICD-10-CM | POA: Diagnosis not present

## 2024-02-23 DIAGNOSIS — E1122 Type 2 diabetes mellitus with diabetic chronic kidney disease: Secondary | ICD-10-CM | POA: Diagnosis not present

## 2024-02-23 DIAGNOSIS — R197 Diarrhea, unspecified: Secondary | ICD-10-CM | POA: Diagnosis not present

## 2024-02-23 DIAGNOSIS — Z992 Dependence on renal dialysis: Secondary | ICD-10-CM | POA: Diagnosis not present

## 2024-02-23 DIAGNOSIS — D689 Coagulation defect, unspecified: Secondary | ICD-10-CM | POA: Diagnosis not present

## 2024-02-23 DIAGNOSIS — N186 End stage renal disease: Secondary | ICD-10-CM | POA: Diagnosis not present

## 2024-02-26 DIAGNOSIS — E1122 Type 2 diabetes mellitus with diabetic chronic kidney disease: Secondary | ICD-10-CM | POA: Diagnosis not present

## 2024-02-26 DIAGNOSIS — R197 Diarrhea, unspecified: Secondary | ICD-10-CM | POA: Diagnosis not present

## 2024-02-26 DIAGNOSIS — Z992 Dependence on renal dialysis: Secondary | ICD-10-CM | POA: Diagnosis not present

## 2024-02-26 DIAGNOSIS — D631 Anemia in chronic kidney disease: Secondary | ICD-10-CM | POA: Diagnosis not present

## 2024-02-26 DIAGNOSIS — D689 Coagulation defect, unspecified: Secondary | ICD-10-CM | POA: Diagnosis not present

## 2024-02-26 DIAGNOSIS — N2581 Secondary hyperparathyroidism of renal origin: Secondary | ICD-10-CM | POA: Diagnosis not present

## 2024-02-26 DIAGNOSIS — D509 Iron deficiency anemia, unspecified: Secondary | ICD-10-CM | POA: Diagnosis not present

## 2024-02-26 DIAGNOSIS — R52 Pain, unspecified: Secondary | ICD-10-CM | POA: Diagnosis not present

## 2024-02-26 DIAGNOSIS — N186 End stage renal disease: Secondary | ICD-10-CM | POA: Diagnosis not present

## 2024-02-28 DIAGNOSIS — E1122 Type 2 diabetes mellitus with diabetic chronic kidney disease: Secondary | ICD-10-CM | POA: Diagnosis not present

## 2024-02-28 DIAGNOSIS — D689 Coagulation defect, unspecified: Secondary | ICD-10-CM | POA: Diagnosis not present

## 2024-02-28 DIAGNOSIS — E039 Hypothyroidism, unspecified: Secondary | ICD-10-CM | POA: Diagnosis not present

## 2024-02-28 DIAGNOSIS — N186 End stage renal disease: Secondary | ICD-10-CM | POA: Diagnosis not present

## 2024-02-28 DIAGNOSIS — D631 Anemia in chronic kidney disease: Secondary | ICD-10-CM | POA: Diagnosis not present

## 2024-02-28 DIAGNOSIS — N2581 Secondary hyperparathyroidism of renal origin: Secondary | ICD-10-CM | POA: Diagnosis not present

## 2024-02-28 DIAGNOSIS — D509 Iron deficiency anemia, unspecified: Secondary | ICD-10-CM | POA: Diagnosis not present

## 2024-02-28 DIAGNOSIS — Z992 Dependence on renal dialysis: Secondary | ICD-10-CM | POA: Diagnosis not present

## 2024-02-28 DIAGNOSIS — R52 Pain, unspecified: Secondary | ICD-10-CM | POA: Diagnosis not present

## 2024-03-01 DIAGNOSIS — N2581 Secondary hyperparathyroidism of renal origin: Secondary | ICD-10-CM | POA: Diagnosis not present

## 2024-03-01 DIAGNOSIS — D631 Anemia in chronic kidney disease: Secondary | ICD-10-CM | POA: Diagnosis not present

## 2024-03-01 DIAGNOSIS — Z992 Dependence on renal dialysis: Secondary | ICD-10-CM | POA: Diagnosis not present

## 2024-03-01 DIAGNOSIS — N186 End stage renal disease: Secondary | ICD-10-CM | POA: Diagnosis not present

## 2024-03-01 DIAGNOSIS — D509 Iron deficiency anemia, unspecified: Secondary | ICD-10-CM | POA: Diagnosis not present

## 2024-03-01 DIAGNOSIS — R52 Pain, unspecified: Secondary | ICD-10-CM | POA: Diagnosis not present

## 2024-03-01 DIAGNOSIS — D689 Coagulation defect, unspecified: Secondary | ICD-10-CM | POA: Diagnosis not present

## 2024-03-01 DIAGNOSIS — E039 Hypothyroidism, unspecified: Secondary | ICD-10-CM | POA: Diagnosis not present

## 2024-03-01 DIAGNOSIS — E1122 Type 2 diabetes mellitus with diabetic chronic kidney disease: Secondary | ICD-10-CM | POA: Diagnosis not present

## 2024-03-04 DIAGNOSIS — D631 Anemia in chronic kidney disease: Secondary | ICD-10-CM | POA: Diagnosis not present

## 2024-03-04 DIAGNOSIS — E1122 Type 2 diabetes mellitus with diabetic chronic kidney disease: Secondary | ICD-10-CM | POA: Diagnosis not present

## 2024-03-04 DIAGNOSIS — Z992 Dependence on renal dialysis: Secondary | ICD-10-CM | POA: Diagnosis not present

## 2024-03-04 DIAGNOSIS — D689 Coagulation defect, unspecified: Secondary | ICD-10-CM | POA: Diagnosis not present

## 2024-03-04 DIAGNOSIS — R52 Pain, unspecified: Secondary | ICD-10-CM | POA: Diagnosis not present

## 2024-03-04 DIAGNOSIS — E039 Hypothyroidism, unspecified: Secondary | ICD-10-CM | POA: Diagnosis not present

## 2024-03-04 DIAGNOSIS — D509 Iron deficiency anemia, unspecified: Secondary | ICD-10-CM | POA: Diagnosis not present

## 2024-03-04 DIAGNOSIS — N186 End stage renal disease: Secondary | ICD-10-CM | POA: Diagnosis not present

## 2024-03-04 DIAGNOSIS — N2581 Secondary hyperparathyroidism of renal origin: Secondary | ICD-10-CM | POA: Diagnosis not present

## 2024-03-06 DIAGNOSIS — D631 Anemia in chronic kidney disease: Secondary | ICD-10-CM | POA: Diagnosis not present

## 2024-03-06 DIAGNOSIS — N2581 Secondary hyperparathyroidism of renal origin: Secondary | ICD-10-CM | POA: Diagnosis not present

## 2024-03-06 DIAGNOSIS — R52 Pain, unspecified: Secondary | ICD-10-CM | POA: Diagnosis not present

## 2024-03-06 DIAGNOSIS — Z992 Dependence on renal dialysis: Secondary | ICD-10-CM | POA: Diagnosis not present

## 2024-03-06 DIAGNOSIS — E1122 Type 2 diabetes mellitus with diabetic chronic kidney disease: Secondary | ICD-10-CM | POA: Diagnosis not present

## 2024-03-06 DIAGNOSIS — D509 Iron deficiency anemia, unspecified: Secondary | ICD-10-CM | POA: Diagnosis not present

## 2024-03-06 DIAGNOSIS — E039 Hypothyroidism, unspecified: Secondary | ICD-10-CM | POA: Diagnosis not present

## 2024-03-06 DIAGNOSIS — D689 Coagulation defect, unspecified: Secondary | ICD-10-CM | POA: Diagnosis not present

## 2024-03-06 DIAGNOSIS — N186 End stage renal disease: Secondary | ICD-10-CM | POA: Diagnosis not present

## 2024-03-08 DIAGNOSIS — D689 Coagulation defect, unspecified: Secondary | ICD-10-CM | POA: Diagnosis not present

## 2024-03-08 DIAGNOSIS — N186 End stage renal disease: Secondary | ICD-10-CM | POA: Diagnosis not present

## 2024-03-08 DIAGNOSIS — Z992 Dependence on renal dialysis: Secondary | ICD-10-CM | POA: Diagnosis not present

## 2024-03-08 DIAGNOSIS — R52 Pain, unspecified: Secondary | ICD-10-CM | POA: Diagnosis not present

## 2024-03-08 DIAGNOSIS — E1122 Type 2 diabetes mellitus with diabetic chronic kidney disease: Secondary | ICD-10-CM | POA: Diagnosis not present

## 2024-03-08 DIAGNOSIS — E039 Hypothyroidism, unspecified: Secondary | ICD-10-CM | POA: Diagnosis not present

## 2024-03-08 DIAGNOSIS — D509 Iron deficiency anemia, unspecified: Secondary | ICD-10-CM | POA: Diagnosis not present

## 2024-03-08 DIAGNOSIS — D631 Anemia in chronic kidney disease: Secondary | ICD-10-CM | POA: Diagnosis not present

## 2024-03-08 DIAGNOSIS — N2581 Secondary hyperparathyroidism of renal origin: Secondary | ICD-10-CM | POA: Diagnosis not present

## 2024-03-13 DIAGNOSIS — D631 Anemia in chronic kidney disease: Secondary | ICD-10-CM | POA: Diagnosis not present

## 2024-03-13 DIAGNOSIS — R52 Pain, unspecified: Secondary | ICD-10-CM | POA: Diagnosis not present

## 2024-03-13 DIAGNOSIS — Z992 Dependence on renal dialysis: Secondary | ICD-10-CM | POA: Diagnosis not present

## 2024-03-13 DIAGNOSIS — E1122 Type 2 diabetes mellitus with diabetic chronic kidney disease: Secondary | ICD-10-CM | POA: Diagnosis not present

## 2024-03-13 DIAGNOSIS — N186 End stage renal disease: Secondary | ICD-10-CM | POA: Diagnosis not present

## 2024-03-13 DIAGNOSIS — E039 Hypothyroidism, unspecified: Secondary | ICD-10-CM | POA: Diagnosis not present

## 2024-03-13 DIAGNOSIS — N2581 Secondary hyperparathyroidism of renal origin: Secondary | ICD-10-CM | POA: Diagnosis not present

## 2024-03-13 DIAGNOSIS — D689 Coagulation defect, unspecified: Secondary | ICD-10-CM | POA: Diagnosis not present

## 2024-03-13 DIAGNOSIS — D509 Iron deficiency anemia, unspecified: Secondary | ICD-10-CM | POA: Diagnosis not present

## 2024-03-15 DIAGNOSIS — E1122 Type 2 diabetes mellitus with diabetic chronic kidney disease: Secondary | ICD-10-CM | POA: Diagnosis not present

## 2024-03-15 DIAGNOSIS — N2581 Secondary hyperparathyroidism of renal origin: Secondary | ICD-10-CM | POA: Diagnosis not present

## 2024-03-15 DIAGNOSIS — N186 End stage renal disease: Secondary | ICD-10-CM | POA: Diagnosis not present

## 2024-03-15 DIAGNOSIS — D509 Iron deficiency anemia, unspecified: Secondary | ICD-10-CM | POA: Diagnosis not present

## 2024-03-15 DIAGNOSIS — D689 Coagulation defect, unspecified: Secondary | ICD-10-CM | POA: Diagnosis not present

## 2024-03-15 DIAGNOSIS — D631 Anemia in chronic kidney disease: Secondary | ICD-10-CM | POA: Diagnosis not present

## 2024-03-15 DIAGNOSIS — R52 Pain, unspecified: Secondary | ICD-10-CM | POA: Diagnosis not present

## 2024-03-15 DIAGNOSIS — E039 Hypothyroidism, unspecified: Secondary | ICD-10-CM | POA: Diagnosis not present

## 2024-03-15 DIAGNOSIS — Z992 Dependence on renal dialysis: Secondary | ICD-10-CM | POA: Diagnosis not present

## 2024-03-18 DIAGNOSIS — E039 Hypothyroidism, unspecified: Secondary | ICD-10-CM | POA: Diagnosis not present

## 2024-03-18 DIAGNOSIS — D631 Anemia in chronic kidney disease: Secondary | ICD-10-CM | POA: Diagnosis not present

## 2024-03-18 DIAGNOSIS — D509 Iron deficiency anemia, unspecified: Secondary | ICD-10-CM | POA: Diagnosis not present

## 2024-03-18 DIAGNOSIS — R52 Pain, unspecified: Secondary | ICD-10-CM | POA: Diagnosis not present

## 2024-03-18 DIAGNOSIS — E1122 Type 2 diabetes mellitus with diabetic chronic kidney disease: Secondary | ICD-10-CM | POA: Diagnosis not present

## 2024-03-18 DIAGNOSIS — N2581 Secondary hyperparathyroidism of renal origin: Secondary | ICD-10-CM | POA: Diagnosis not present

## 2024-03-18 DIAGNOSIS — N186 End stage renal disease: Secondary | ICD-10-CM | POA: Diagnosis not present

## 2024-03-18 DIAGNOSIS — Z992 Dependence on renal dialysis: Secondary | ICD-10-CM | POA: Diagnosis not present

## 2024-03-18 DIAGNOSIS — D689 Coagulation defect, unspecified: Secondary | ICD-10-CM | POA: Diagnosis not present

## 2024-03-20 DIAGNOSIS — E039 Hypothyroidism, unspecified: Secondary | ICD-10-CM | POA: Diagnosis not present

## 2024-03-20 DIAGNOSIS — D689 Coagulation defect, unspecified: Secondary | ICD-10-CM | POA: Diagnosis not present

## 2024-03-20 DIAGNOSIS — Z992 Dependence on renal dialysis: Secondary | ICD-10-CM | POA: Diagnosis not present

## 2024-03-20 DIAGNOSIS — D509 Iron deficiency anemia, unspecified: Secondary | ICD-10-CM | POA: Diagnosis not present

## 2024-03-20 DIAGNOSIS — N2581 Secondary hyperparathyroidism of renal origin: Secondary | ICD-10-CM | POA: Diagnosis not present

## 2024-03-20 DIAGNOSIS — R52 Pain, unspecified: Secondary | ICD-10-CM | POA: Diagnosis not present

## 2024-03-20 DIAGNOSIS — E1122 Type 2 diabetes mellitus with diabetic chronic kidney disease: Secondary | ICD-10-CM | POA: Diagnosis not present

## 2024-03-20 DIAGNOSIS — N186 End stage renal disease: Secondary | ICD-10-CM | POA: Diagnosis not present

## 2024-03-20 DIAGNOSIS — D631 Anemia in chronic kidney disease: Secondary | ICD-10-CM | POA: Diagnosis not present

## 2024-03-22 DIAGNOSIS — D509 Iron deficiency anemia, unspecified: Secondary | ICD-10-CM | POA: Diagnosis not present

## 2024-03-22 DIAGNOSIS — E039 Hypothyroidism, unspecified: Secondary | ICD-10-CM | POA: Diagnosis not present

## 2024-03-22 DIAGNOSIS — Z992 Dependence on renal dialysis: Secondary | ICD-10-CM | POA: Diagnosis not present

## 2024-03-22 DIAGNOSIS — D631 Anemia in chronic kidney disease: Secondary | ICD-10-CM | POA: Diagnosis not present

## 2024-03-22 DIAGNOSIS — R52 Pain, unspecified: Secondary | ICD-10-CM | POA: Diagnosis not present

## 2024-03-22 DIAGNOSIS — D689 Coagulation defect, unspecified: Secondary | ICD-10-CM | POA: Diagnosis not present

## 2024-03-22 DIAGNOSIS — E1122 Type 2 diabetes mellitus with diabetic chronic kidney disease: Secondary | ICD-10-CM | POA: Diagnosis not present

## 2024-03-22 DIAGNOSIS — N186 End stage renal disease: Secondary | ICD-10-CM | POA: Diagnosis not present

## 2024-03-22 DIAGNOSIS — N2581 Secondary hyperparathyroidism of renal origin: Secondary | ICD-10-CM | POA: Diagnosis not present

## 2024-03-25 ENCOUNTER — Ambulatory Visit: Admitting: Orthopedic Surgery

## 2024-03-25 DIAGNOSIS — D631 Anemia in chronic kidney disease: Secondary | ICD-10-CM | POA: Diagnosis not present

## 2024-03-25 DIAGNOSIS — E039 Hypothyroidism, unspecified: Secondary | ICD-10-CM | POA: Diagnosis not present

## 2024-03-25 DIAGNOSIS — D689 Coagulation defect, unspecified: Secondary | ICD-10-CM | POA: Diagnosis not present

## 2024-03-25 DIAGNOSIS — D509 Iron deficiency anemia, unspecified: Secondary | ICD-10-CM | POA: Diagnosis not present

## 2024-03-25 DIAGNOSIS — E1122 Type 2 diabetes mellitus with diabetic chronic kidney disease: Secondary | ICD-10-CM | POA: Diagnosis not present

## 2024-03-25 DIAGNOSIS — R52 Pain, unspecified: Secondary | ICD-10-CM | POA: Diagnosis not present

## 2024-03-25 DIAGNOSIS — N2581 Secondary hyperparathyroidism of renal origin: Secondary | ICD-10-CM | POA: Diagnosis not present

## 2024-03-25 DIAGNOSIS — Z992 Dependence on renal dialysis: Secondary | ICD-10-CM | POA: Diagnosis not present

## 2024-03-25 DIAGNOSIS — N186 End stage renal disease: Secondary | ICD-10-CM | POA: Diagnosis not present

## 2024-03-27 DIAGNOSIS — R52 Pain, unspecified: Secondary | ICD-10-CM | POA: Diagnosis not present

## 2024-03-27 DIAGNOSIS — E1122 Type 2 diabetes mellitus with diabetic chronic kidney disease: Secondary | ICD-10-CM | POA: Diagnosis not present

## 2024-03-27 DIAGNOSIS — Z992 Dependence on renal dialysis: Secondary | ICD-10-CM | POA: Diagnosis not present

## 2024-03-27 DIAGNOSIS — E039 Hypothyroidism, unspecified: Secondary | ICD-10-CM | POA: Diagnosis not present

## 2024-03-27 DIAGNOSIS — N186 End stage renal disease: Secondary | ICD-10-CM | POA: Diagnosis not present

## 2024-03-27 DIAGNOSIS — N2581 Secondary hyperparathyroidism of renal origin: Secondary | ICD-10-CM | POA: Diagnosis not present

## 2024-03-27 DIAGNOSIS — D509 Iron deficiency anemia, unspecified: Secondary | ICD-10-CM | POA: Diagnosis not present

## 2024-03-27 DIAGNOSIS — D631 Anemia in chronic kidney disease: Secondary | ICD-10-CM | POA: Diagnosis not present

## 2024-03-27 DIAGNOSIS — D689 Coagulation defect, unspecified: Secondary | ICD-10-CM | POA: Diagnosis not present

## 2024-03-29 DIAGNOSIS — E1122 Type 2 diabetes mellitus with diabetic chronic kidney disease: Secondary | ICD-10-CM | POA: Diagnosis not present

## 2024-03-29 DIAGNOSIS — R52 Pain, unspecified: Secondary | ICD-10-CM | POA: Diagnosis not present

## 2024-03-29 DIAGNOSIS — D689 Coagulation defect, unspecified: Secondary | ICD-10-CM | POA: Diagnosis not present

## 2024-03-29 DIAGNOSIS — N186 End stage renal disease: Secondary | ICD-10-CM | POA: Diagnosis not present

## 2024-03-29 DIAGNOSIS — D509 Iron deficiency anemia, unspecified: Secondary | ICD-10-CM | POA: Diagnosis not present

## 2024-03-29 DIAGNOSIS — Z992 Dependence on renal dialysis: Secondary | ICD-10-CM | POA: Diagnosis not present

## 2024-03-29 DIAGNOSIS — D631 Anemia in chronic kidney disease: Secondary | ICD-10-CM | POA: Diagnosis not present

## 2024-03-29 DIAGNOSIS — E039 Hypothyroidism, unspecified: Secondary | ICD-10-CM | POA: Diagnosis not present

## 2024-03-29 DIAGNOSIS — N2581 Secondary hyperparathyroidism of renal origin: Secondary | ICD-10-CM | POA: Diagnosis not present

## 2024-03-30 DIAGNOSIS — Z992 Dependence on renal dialysis: Secondary | ICD-10-CM | POA: Diagnosis not present

## 2024-03-30 DIAGNOSIS — N186 End stage renal disease: Secondary | ICD-10-CM | POA: Diagnosis not present

## 2024-04-01 DIAGNOSIS — D631 Anemia in chronic kidney disease: Secondary | ICD-10-CM | POA: Diagnosis not present

## 2024-04-01 DIAGNOSIS — Z992 Dependence on renal dialysis: Secondary | ICD-10-CM | POA: Diagnosis not present

## 2024-04-01 DIAGNOSIS — N2581 Secondary hyperparathyroidism of renal origin: Secondary | ICD-10-CM | POA: Diagnosis not present

## 2024-04-01 DIAGNOSIS — N186 End stage renal disease: Secondary | ICD-10-CM | POA: Diagnosis not present

## 2024-04-01 DIAGNOSIS — R197 Diarrhea, unspecified: Secondary | ICD-10-CM | POA: Diagnosis not present

## 2024-04-01 DIAGNOSIS — D689 Coagulation defect, unspecified: Secondary | ICD-10-CM | POA: Diagnosis not present

## 2024-04-01 DIAGNOSIS — E1122 Type 2 diabetes mellitus with diabetic chronic kidney disease: Secondary | ICD-10-CM | POA: Diagnosis not present

## 2024-04-01 DIAGNOSIS — R52 Pain, unspecified: Secondary | ICD-10-CM | POA: Diagnosis not present

## 2024-04-03 DIAGNOSIS — N186 End stage renal disease: Secondary | ICD-10-CM | POA: Diagnosis not present

## 2024-04-03 DIAGNOSIS — N2581 Secondary hyperparathyroidism of renal origin: Secondary | ICD-10-CM | POA: Diagnosis not present

## 2024-04-03 DIAGNOSIS — R197 Diarrhea, unspecified: Secondary | ICD-10-CM | POA: Diagnosis not present

## 2024-04-03 DIAGNOSIS — R52 Pain, unspecified: Secondary | ICD-10-CM | POA: Diagnosis not present

## 2024-04-03 DIAGNOSIS — D631 Anemia in chronic kidney disease: Secondary | ICD-10-CM | POA: Diagnosis not present

## 2024-04-03 DIAGNOSIS — Z992 Dependence on renal dialysis: Secondary | ICD-10-CM | POA: Diagnosis not present

## 2024-04-03 DIAGNOSIS — D689 Coagulation defect, unspecified: Secondary | ICD-10-CM | POA: Diagnosis not present

## 2024-04-03 DIAGNOSIS — E1122 Type 2 diabetes mellitus with diabetic chronic kidney disease: Secondary | ICD-10-CM | POA: Diagnosis not present

## 2024-04-05 DIAGNOSIS — R52 Pain, unspecified: Secondary | ICD-10-CM | POA: Diagnosis not present

## 2024-04-05 DIAGNOSIS — Z992 Dependence on renal dialysis: Secondary | ICD-10-CM | POA: Diagnosis not present

## 2024-04-05 DIAGNOSIS — D631 Anemia in chronic kidney disease: Secondary | ICD-10-CM | POA: Diagnosis not present

## 2024-04-05 DIAGNOSIS — N2581 Secondary hyperparathyroidism of renal origin: Secondary | ICD-10-CM | POA: Diagnosis not present

## 2024-04-05 DIAGNOSIS — D689 Coagulation defect, unspecified: Secondary | ICD-10-CM | POA: Diagnosis not present

## 2024-04-05 DIAGNOSIS — N186 End stage renal disease: Secondary | ICD-10-CM | POA: Diagnosis not present

## 2024-04-05 DIAGNOSIS — R197 Diarrhea, unspecified: Secondary | ICD-10-CM | POA: Diagnosis not present

## 2024-04-05 DIAGNOSIS — E1122 Type 2 diabetes mellitus with diabetic chronic kidney disease: Secondary | ICD-10-CM | POA: Diagnosis not present

## 2024-04-08 DIAGNOSIS — R52 Pain, unspecified: Secondary | ICD-10-CM | POA: Diagnosis not present

## 2024-04-08 DIAGNOSIS — R197 Diarrhea, unspecified: Secondary | ICD-10-CM | POA: Diagnosis not present

## 2024-04-08 DIAGNOSIS — N2581 Secondary hyperparathyroidism of renal origin: Secondary | ICD-10-CM | POA: Diagnosis not present

## 2024-04-08 DIAGNOSIS — D689 Coagulation defect, unspecified: Secondary | ICD-10-CM | POA: Diagnosis not present

## 2024-04-08 DIAGNOSIS — D631 Anemia in chronic kidney disease: Secondary | ICD-10-CM | POA: Diagnosis not present

## 2024-04-08 DIAGNOSIS — E1122 Type 2 diabetes mellitus with diabetic chronic kidney disease: Secondary | ICD-10-CM | POA: Diagnosis not present

## 2024-04-08 DIAGNOSIS — Z992 Dependence on renal dialysis: Secondary | ICD-10-CM | POA: Diagnosis not present

## 2024-04-08 DIAGNOSIS — N186 End stage renal disease: Secondary | ICD-10-CM | POA: Diagnosis not present

## 2024-04-10 DIAGNOSIS — R52 Pain, unspecified: Secondary | ICD-10-CM | POA: Diagnosis not present

## 2024-04-10 DIAGNOSIS — N2581 Secondary hyperparathyroidism of renal origin: Secondary | ICD-10-CM | POA: Diagnosis not present

## 2024-04-10 DIAGNOSIS — Z992 Dependence on renal dialysis: Secondary | ICD-10-CM | POA: Diagnosis not present

## 2024-04-10 DIAGNOSIS — N186 End stage renal disease: Secondary | ICD-10-CM | POA: Diagnosis not present

## 2024-04-10 DIAGNOSIS — R197 Diarrhea, unspecified: Secondary | ICD-10-CM | POA: Diagnosis not present

## 2024-04-10 DIAGNOSIS — E1122 Type 2 diabetes mellitus with diabetic chronic kidney disease: Secondary | ICD-10-CM | POA: Diagnosis not present

## 2024-04-10 DIAGNOSIS — D631 Anemia in chronic kidney disease: Secondary | ICD-10-CM | POA: Diagnosis not present

## 2024-04-10 DIAGNOSIS — D689 Coagulation defect, unspecified: Secondary | ICD-10-CM | POA: Diagnosis not present

## 2024-04-12 DIAGNOSIS — D631 Anemia in chronic kidney disease: Secondary | ICD-10-CM | POA: Diagnosis not present

## 2024-04-12 DIAGNOSIS — R52 Pain, unspecified: Secondary | ICD-10-CM | POA: Diagnosis not present

## 2024-04-12 DIAGNOSIS — N186 End stage renal disease: Secondary | ICD-10-CM | POA: Diagnosis not present

## 2024-04-12 DIAGNOSIS — R197 Diarrhea, unspecified: Secondary | ICD-10-CM | POA: Diagnosis not present

## 2024-04-12 DIAGNOSIS — N2581 Secondary hyperparathyroidism of renal origin: Secondary | ICD-10-CM | POA: Diagnosis not present

## 2024-04-12 DIAGNOSIS — D689 Coagulation defect, unspecified: Secondary | ICD-10-CM | POA: Diagnosis not present

## 2024-04-12 DIAGNOSIS — Z992 Dependence on renal dialysis: Secondary | ICD-10-CM | POA: Diagnosis not present

## 2024-04-12 DIAGNOSIS — E1122 Type 2 diabetes mellitus with diabetic chronic kidney disease: Secondary | ICD-10-CM | POA: Diagnosis not present

## 2024-04-15 DIAGNOSIS — R52 Pain, unspecified: Secondary | ICD-10-CM | POA: Diagnosis not present

## 2024-04-15 DIAGNOSIS — E1122 Type 2 diabetes mellitus with diabetic chronic kidney disease: Secondary | ICD-10-CM | POA: Diagnosis not present

## 2024-04-15 DIAGNOSIS — R197 Diarrhea, unspecified: Secondary | ICD-10-CM | POA: Diagnosis not present

## 2024-04-15 DIAGNOSIS — N2581 Secondary hyperparathyroidism of renal origin: Secondary | ICD-10-CM | POA: Diagnosis not present

## 2024-04-15 DIAGNOSIS — N186 End stage renal disease: Secondary | ICD-10-CM | POA: Diagnosis not present

## 2024-04-15 DIAGNOSIS — D689 Coagulation defect, unspecified: Secondary | ICD-10-CM | POA: Diagnosis not present

## 2024-04-15 DIAGNOSIS — Z992 Dependence on renal dialysis: Secondary | ICD-10-CM | POA: Diagnosis not present

## 2024-04-15 DIAGNOSIS — D631 Anemia in chronic kidney disease: Secondary | ICD-10-CM | POA: Diagnosis not present

## 2024-04-17 DIAGNOSIS — R197 Diarrhea, unspecified: Secondary | ICD-10-CM | POA: Diagnosis not present

## 2024-04-17 DIAGNOSIS — D631 Anemia in chronic kidney disease: Secondary | ICD-10-CM | POA: Diagnosis not present

## 2024-04-17 DIAGNOSIS — D689 Coagulation defect, unspecified: Secondary | ICD-10-CM | POA: Diagnosis not present

## 2024-04-17 DIAGNOSIS — Z992 Dependence on renal dialysis: Secondary | ICD-10-CM | POA: Diagnosis not present

## 2024-04-17 DIAGNOSIS — N186 End stage renal disease: Secondary | ICD-10-CM | POA: Diagnosis not present

## 2024-04-17 DIAGNOSIS — E1122 Type 2 diabetes mellitus with diabetic chronic kidney disease: Secondary | ICD-10-CM | POA: Diagnosis not present

## 2024-04-17 DIAGNOSIS — N2581 Secondary hyperparathyroidism of renal origin: Secondary | ICD-10-CM | POA: Diagnosis not present

## 2024-04-17 DIAGNOSIS — R52 Pain, unspecified: Secondary | ICD-10-CM | POA: Diagnosis not present

## 2024-04-19 DIAGNOSIS — Z992 Dependence on renal dialysis: Secondary | ICD-10-CM | POA: Diagnosis not present

## 2024-04-19 DIAGNOSIS — N186 End stage renal disease: Secondary | ICD-10-CM | POA: Diagnosis not present

## 2024-04-19 DIAGNOSIS — N2581 Secondary hyperparathyroidism of renal origin: Secondary | ICD-10-CM | POA: Diagnosis not present

## 2024-04-19 DIAGNOSIS — D689 Coagulation defect, unspecified: Secondary | ICD-10-CM | POA: Diagnosis not present

## 2024-04-19 DIAGNOSIS — R52 Pain, unspecified: Secondary | ICD-10-CM | POA: Diagnosis not present

## 2024-04-19 DIAGNOSIS — D631 Anemia in chronic kidney disease: Secondary | ICD-10-CM | POA: Diagnosis not present

## 2024-04-19 DIAGNOSIS — R197 Diarrhea, unspecified: Secondary | ICD-10-CM | POA: Diagnosis not present

## 2024-04-19 DIAGNOSIS — E1122 Type 2 diabetes mellitus with diabetic chronic kidney disease: Secondary | ICD-10-CM | POA: Diagnosis not present

## 2024-04-22 DIAGNOSIS — N186 End stage renal disease: Secondary | ICD-10-CM | POA: Diagnosis not present

## 2024-04-22 DIAGNOSIS — R197 Diarrhea, unspecified: Secondary | ICD-10-CM | POA: Diagnosis not present

## 2024-04-22 DIAGNOSIS — D631 Anemia in chronic kidney disease: Secondary | ICD-10-CM | POA: Diagnosis not present

## 2024-04-22 DIAGNOSIS — Z992 Dependence on renal dialysis: Secondary | ICD-10-CM | POA: Diagnosis not present

## 2024-04-22 DIAGNOSIS — R52 Pain, unspecified: Secondary | ICD-10-CM | POA: Diagnosis not present

## 2024-04-22 DIAGNOSIS — N2581 Secondary hyperparathyroidism of renal origin: Secondary | ICD-10-CM | POA: Diagnosis not present

## 2024-04-22 DIAGNOSIS — D689 Coagulation defect, unspecified: Secondary | ICD-10-CM | POA: Diagnosis not present

## 2024-04-22 DIAGNOSIS — E1122 Type 2 diabetes mellitus with diabetic chronic kidney disease: Secondary | ICD-10-CM | POA: Diagnosis not present

## 2024-04-24 DIAGNOSIS — E1122 Type 2 diabetes mellitus with diabetic chronic kidney disease: Secondary | ICD-10-CM | POA: Diagnosis not present

## 2024-04-24 DIAGNOSIS — Z992 Dependence on renal dialysis: Secondary | ICD-10-CM | POA: Diagnosis not present

## 2024-04-24 DIAGNOSIS — R197 Diarrhea, unspecified: Secondary | ICD-10-CM | POA: Diagnosis not present

## 2024-04-24 DIAGNOSIS — D631 Anemia in chronic kidney disease: Secondary | ICD-10-CM | POA: Diagnosis not present

## 2024-04-24 DIAGNOSIS — N2581 Secondary hyperparathyroidism of renal origin: Secondary | ICD-10-CM | POA: Diagnosis not present

## 2024-04-24 DIAGNOSIS — D689 Coagulation defect, unspecified: Secondary | ICD-10-CM | POA: Diagnosis not present

## 2024-04-24 DIAGNOSIS — N186 End stage renal disease: Secondary | ICD-10-CM | POA: Diagnosis not present

## 2024-04-24 DIAGNOSIS — R52 Pain, unspecified: Secondary | ICD-10-CM | POA: Diagnosis not present

## 2024-04-26 DIAGNOSIS — N186 End stage renal disease: Secondary | ICD-10-CM | POA: Diagnosis not present

## 2024-04-26 DIAGNOSIS — D631 Anemia in chronic kidney disease: Secondary | ICD-10-CM | POA: Diagnosis not present

## 2024-04-26 DIAGNOSIS — R197 Diarrhea, unspecified: Secondary | ICD-10-CM | POA: Diagnosis not present

## 2024-04-26 DIAGNOSIS — Z992 Dependence on renal dialysis: Secondary | ICD-10-CM | POA: Diagnosis not present

## 2024-04-26 DIAGNOSIS — N2581 Secondary hyperparathyroidism of renal origin: Secondary | ICD-10-CM | POA: Diagnosis not present

## 2024-04-26 DIAGNOSIS — R52 Pain, unspecified: Secondary | ICD-10-CM | POA: Diagnosis not present

## 2024-04-26 DIAGNOSIS — E1122 Type 2 diabetes mellitus with diabetic chronic kidney disease: Secondary | ICD-10-CM | POA: Diagnosis not present

## 2024-04-26 DIAGNOSIS — D689 Coagulation defect, unspecified: Secondary | ICD-10-CM | POA: Diagnosis not present

## 2024-04-29 DIAGNOSIS — R197 Diarrhea, unspecified: Secondary | ICD-10-CM | POA: Diagnosis not present

## 2024-04-29 DIAGNOSIS — N186 End stage renal disease: Secondary | ICD-10-CM | POA: Diagnosis not present

## 2024-04-29 DIAGNOSIS — D631 Anemia in chronic kidney disease: Secondary | ICD-10-CM | POA: Diagnosis not present

## 2024-04-29 DIAGNOSIS — E1122 Type 2 diabetes mellitus with diabetic chronic kidney disease: Secondary | ICD-10-CM | POA: Diagnosis not present

## 2024-04-29 DIAGNOSIS — D689 Coagulation defect, unspecified: Secondary | ICD-10-CM | POA: Diagnosis not present

## 2024-04-29 DIAGNOSIS — N2581 Secondary hyperparathyroidism of renal origin: Secondary | ICD-10-CM | POA: Diagnosis not present

## 2024-04-29 DIAGNOSIS — Z992 Dependence on renal dialysis: Secondary | ICD-10-CM | POA: Diagnosis not present

## 2024-04-29 DIAGNOSIS — R52 Pain, unspecified: Secondary | ICD-10-CM | POA: Diagnosis not present

## 2024-05-01 DIAGNOSIS — D631 Anemia in chronic kidney disease: Secondary | ICD-10-CM | POA: Diagnosis not present

## 2024-05-01 DIAGNOSIS — N186 End stage renal disease: Secondary | ICD-10-CM | POA: Diagnosis not present

## 2024-05-01 DIAGNOSIS — Z992 Dependence on renal dialysis: Secondary | ICD-10-CM | POA: Diagnosis not present

## 2024-05-01 DIAGNOSIS — N2581 Secondary hyperparathyroidism of renal origin: Secondary | ICD-10-CM | POA: Diagnosis not present

## 2024-05-01 DIAGNOSIS — R52 Pain, unspecified: Secondary | ICD-10-CM | POA: Diagnosis not present

## 2024-05-01 DIAGNOSIS — E1122 Type 2 diabetes mellitus with diabetic chronic kidney disease: Secondary | ICD-10-CM | POA: Diagnosis not present

## 2024-05-01 DIAGNOSIS — D689 Coagulation defect, unspecified: Secondary | ICD-10-CM | POA: Diagnosis not present

## 2024-05-01 DIAGNOSIS — R197 Diarrhea, unspecified: Secondary | ICD-10-CM | POA: Diagnosis not present

## 2024-05-03 DIAGNOSIS — N186 End stage renal disease: Secondary | ICD-10-CM | POA: Diagnosis not present

## 2024-05-03 DIAGNOSIS — Z992 Dependence on renal dialysis: Secondary | ICD-10-CM | POA: Diagnosis not present

## 2024-05-03 DIAGNOSIS — E1122 Type 2 diabetes mellitus with diabetic chronic kidney disease: Secondary | ICD-10-CM | POA: Diagnosis not present

## 2024-05-03 DIAGNOSIS — R197 Diarrhea, unspecified: Secondary | ICD-10-CM | POA: Diagnosis not present

## 2024-05-03 DIAGNOSIS — N2581 Secondary hyperparathyroidism of renal origin: Secondary | ICD-10-CM | POA: Diagnosis not present

## 2024-05-03 DIAGNOSIS — D689 Coagulation defect, unspecified: Secondary | ICD-10-CM | POA: Diagnosis not present

## 2024-05-03 DIAGNOSIS — R52 Pain, unspecified: Secondary | ICD-10-CM | POA: Diagnosis not present

## 2024-05-03 DIAGNOSIS — D631 Anemia in chronic kidney disease: Secondary | ICD-10-CM | POA: Diagnosis not present

## 2024-05-06 DIAGNOSIS — R197 Diarrhea, unspecified: Secondary | ICD-10-CM | POA: Diagnosis not present

## 2024-05-06 DIAGNOSIS — D689 Coagulation defect, unspecified: Secondary | ICD-10-CM | POA: Diagnosis not present

## 2024-05-06 DIAGNOSIS — D631 Anemia in chronic kidney disease: Secondary | ICD-10-CM | POA: Diagnosis not present

## 2024-05-06 DIAGNOSIS — Z992 Dependence on renal dialysis: Secondary | ICD-10-CM | POA: Diagnosis not present

## 2024-05-06 DIAGNOSIS — E1122 Type 2 diabetes mellitus with diabetic chronic kidney disease: Secondary | ICD-10-CM | POA: Diagnosis not present

## 2024-05-06 DIAGNOSIS — N186 End stage renal disease: Secondary | ICD-10-CM | POA: Diagnosis not present

## 2024-05-06 DIAGNOSIS — R52 Pain, unspecified: Secondary | ICD-10-CM | POA: Diagnosis not present

## 2024-05-06 DIAGNOSIS — N2581 Secondary hyperparathyroidism of renal origin: Secondary | ICD-10-CM | POA: Diagnosis not present

## 2024-05-08 DIAGNOSIS — E1122 Type 2 diabetes mellitus with diabetic chronic kidney disease: Secondary | ICD-10-CM | POA: Diagnosis not present

## 2024-05-08 DIAGNOSIS — D689 Coagulation defect, unspecified: Secondary | ICD-10-CM | POA: Diagnosis not present

## 2024-05-08 DIAGNOSIS — D631 Anemia in chronic kidney disease: Secondary | ICD-10-CM | POA: Diagnosis not present

## 2024-05-08 DIAGNOSIS — N2581 Secondary hyperparathyroidism of renal origin: Secondary | ICD-10-CM | POA: Diagnosis not present

## 2024-05-08 DIAGNOSIS — R197 Diarrhea, unspecified: Secondary | ICD-10-CM | POA: Diagnosis not present

## 2024-05-08 DIAGNOSIS — Z992 Dependence on renal dialysis: Secondary | ICD-10-CM | POA: Diagnosis not present

## 2024-05-08 DIAGNOSIS — N186 End stage renal disease: Secondary | ICD-10-CM | POA: Diagnosis not present

## 2024-05-08 DIAGNOSIS — R52 Pain, unspecified: Secondary | ICD-10-CM | POA: Diagnosis not present

## 2024-05-10 DIAGNOSIS — N2581 Secondary hyperparathyroidism of renal origin: Secondary | ICD-10-CM | POA: Diagnosis not present

## 2024-05-10 DIAGNOSIS — Z992 Dependence on renal dialysis: Secondary | ICD-10-CM | POA: Diagnosis not present

## 2024-05-10 DIAGNOSIS — D689 Coagulation defect, unspecified: Secondary | ICD-10-CM | POA: Diagnosis not present

## 2024-05-10 DIAGNOSIS — D631 Anemia in chronic kidney disease: Secondary | ICD-10-CM | POA: Diagnosis not present

## 2024-05-10 DIAGNOSIS — R197 Diarrhea, unspecified: Secondary | ICD-10-CM | POA: Diagnosis not present

## 2024-05-10 DIAGNOSIS — N186 End stage renal disease: Secondary | ICD-10-CM | POA: Diagnosis not present

## 2024-05-10 DIAGNOSIS — E1122 Type 2 diabetes mellitus with diabetic chronic kidney disease: Secondary | ICD-10-CM | POA: Diagnosis not present

## 2024-05-10 DIAGNOSIS — R52 Pain, unspecified: Secondary | ICD-10-CM | POA: Diagnosis not present

## 2024-05-13 DIAGNOSIS — N2581 Secondary hyperparathyroidism of renal origin: Secondary | ICD-10-CM | POA: Diagnosis not present

## 2024-05-13 DIAGNOSIS — R52 Pain, unspecified: Secondary | ICD-10-CM | POA: Diagnosis not present

## 2024-05-13 DIAGNOSIS — D689 Coagulation defect, unspecified: Secondary | ICD-10-CM | POA: Diagnosis not present

## 2024-05-13 DIAGNOSIS — E1122 Type 2 diabetes mellitus with diabetic chronic kidney disease: Secondary | ICD-10-CM | POA: Diagnosis not present

## 2024-05-13 DIAGNOSIS — R197 Diarrhea, unspecified: Secondary | ICD-10-CM | POA: Diagnosis not present

## 2024-05-13 DIAGNOSIS — N186 End stage renal disease: Secondary | ICD-10-CM | POA: Diagnosis not present

## 2024-05-13 DIAGNOSIS — Z992 Dependence on renal dialysis: Secondary | ICD-10-CM | POA: Diagnosis not present

## 2024-05-13 DIAGNOSIS — D631 Anemia in chronic kidney disease: Secondary | ICD-10-CM | POA: Diagnosis not present

## 2024-05-15 ENCOUNTER — Ambulatory Visit: Attending: Internal Medicine | Admitting: Internal Medicine

## 2024-05-15 VITALS — BP 142/80 | HR 100 | Ht 72.0 in | Wt 196.0 lb

## 2024-05-15 DIAGNOSIS — N185 Chronic kidney disease, stage 5: Secondary | ICD-10-CM

## 2024-05-15 DIAGNOSIS — I10 Essential (primary) hypertension: Secondary | ICD-10-CM | POA: Diagnosis not present

## 2024-05-15 DIAGNOSIS — I120 Hypertensive chronic kidney disease with stage 5 chronic kidney disease or end stage renal disease: Secondary | ICD-10-CM

## 2024-05-15 DIAGNOSIS — R079 Chest pain, unspecified: Secondary | ICD-10-CM

## 2024-05-15 NOTE — Progress Notes (Signed)
 Cardiology Office Note  Date: 05/15/2024   ID: [name], [name] 1956/04/22, MRN [id number]  PCP:  [name] [name], MD  Cardiologist:  Diannah SHAUNNA Maywood, MD Electrophysiologist:  None   History of Present Illness: Charles [name] is a 68 y.o. male  Here for follow-up visit.  He has ESRD.  He was initially admitted to [facility] in October 2023 with chest pain that occurred when he was vomiting after he had food. Hs troponins mildly elevated, 27, 27.  EKG was unremarkable.  He underwent outpatient NM stress test that showed no ischemia and scar cannot be excluded.  No large ischemic territories.  He denied having any angina or DOE in the last 1 year.  Does not have any palpitations.  He has dizziness especially from sitting to standing position and lasts for a few minutes when he walks however after he sits for some time and starts walking back again, his dizziness completely resolved.  No syncope, leg swelling.  Current smoker.  Past Medical History:  Diagnosis Date   Anemia    Anxiety    Chronic back pain    Dementia    Nursing facility feels he has Dementia   Diabetes mellitus    Facial numbness    Fracture of right foot    Hypertension    Mental disorder    schizophrenia   Renal insufficiency     Past Surgical History:  Procedure Laterality Date   AV FISTULA PLACEMENT Left 12/20/2021   Procedure: LEFT ARM ARTERIOVENOUS (AV) FISTULA CREATION;  Surgeon: Oris Krystal FALCON, MD;  [facility]: [facility];  Service: Vascular;  Laterality: Left;   AV FISTULA PLACEMENT Left 03/13/2023   Procedure: INSERTION OF LEFT ARM ARTERIOVENOUS (AV) GORE-TEX GRAFT;  Surgeon: Oris Krystal FALCON, MD;  Location: [facility];  Service: Vascular;  Laterality: Left;   BIOPSY  08/02/2021   Procedure: BIOPSY;  Surgeon: Eartha Angelia Sieving, MD;  [facility];  Service: Gastroenterology;;  duodenal and gastric biopsies   COLONOSCOPY N/A 04/11/2018   Procedure: COLONOSCOPY;  Surgeon: Golda Claudis PENNER, MD;   [facility];  Service: Endoscopy;  Laterality: N/A;  10:30   COLONOSCOPY WITH PROPOFOL  N/A 07/05/2018   Procedure: COLONOSCOPY WITH PROPOFOL ;  Surgeon: Golda Claudis PENNER, MD;  [facility];  Service: Endoscopy;  Laterality: N/A;  10:35   COLONOSCOPY WITH PROPOFOL  N/A 08/02/2021   Procedure: COLONOSCOPY WITH PROPOFOL ;  Surgeon: Eartha Angelia Sieving, MD;  [facility];  Service: Gastroenterology;  Laterality: N/A;  9:05   ESOPHAGOGASTRODUODENOSCOPY (EGD) WITH PROPOFOL  N/A 08/02/2021   Procedure: ESOPHAGOGASTRODUODENOSCOPY (EGD) WITH PROPOFOL ;  Surgeon: Eartha Angelia Sieving, MD;  [facility];  Service: Gastroenterology;  Laterality: N/A;   left 1st toe     ORIF TOE FRACTURE  09/05/2011   Procedure: OPEN REDUCTION INTERNAL FIXATION (ORIF) METATARSAL (TOE) FRACTURE;  Surgeon: Deward DELENA Schwartz;  Location: [facility];  Service: Orthopedics;  Laterality: Left;  reconstruction left great toe FHB plantar plate   PERIPHERAL VASCULAR BALLOON ANGIOPLASTY  11/02/2023   Procedure: PERIPHERAL VASCULAR BALLOON ANGIOPLASTY;  Surgeon: Melia Lynwood LELON, MD;  [facility];  Service: Cardiovascular;;   PERIPHERAL VASCULAR THROMBECTOMY N/A 11/02/2023   Procedure: PERIPHERAL VASCULAR THROMBECTOMY;  Surgeon: Melia Lynwood LELON, MD;  Location: [facility];  Service: Cardiovascular;  Laterality: N/A;   POLYPECTOMY  04/11/2018   Procedure: POLYPECTOMY;  Surgeon: Golda Claudis PENNER, MD;  [facility];  Service: Endoscopy;;  colon   POLYPECTOMY  07/05/2018   Procedure: POLYPECTOMY;  Surgeon: Golda Claudis PENNER, MD;  [facility];  Service: Endoscopy;;  colon   POLYPECTOMY  08/02/2021   Procedure: POLYPECTOMY;  Surgeon: Eartha Angelia Sieving, MD;  [facility];  Service: Gastroenterology;;  transverse colon polyp x 1 ascending colon polyp   right foot suger      Current Outpatient Medications  Medication Sig Dispense Refill    aspirin  EC 81 MG tablet Take 81 mg by mouth daily with breakfast.     AURYXIA 1 GM 210 MG(Fe) tablet Take by mouth.     carvedilol  (COREG ) 6.25 MG tablet Take 1 tablet (6.25 mg total) by mouth 2 (two) times daily. 60 tablet 3   Cholecalciferol  25 MCG (1000 UT) capsule Take 5,000 Units by mouth daily.     ferrous sulfate  325 (65 FE) MG tablet Take 325 mg by mouth daily with breakfast.     furosemide (LASIX) 20 MG tablet Take 40 mg by mouth daily.     gabapentin  (NEURONTIN ) 300 MG capsule Take 1 capsule (300 mg total) by mouth at bedtime. 90 capsule 3   Methoxy PEG-Epoetin  Beta (MIRCERA IJ)      NIFEdipine  (PROCARDIA  XL/NIFEDICAL XL) 60 MG 24 hr tablet Take 1 tablet (60 mg total) by mouth daily. 30 tablet 3   omeprazole  (PRILOSEC) 40 MG capsule Take 1 capsule (40 mg total) by mouth 2 (two) times daily. (Patient not taking: Reported on 10/22/2023) 60 capsule 1   OVER THE COUNTER MEDICATION D 3 5,000 IU once per day. In addition to five 1,000 IU daily.     oxyCODONE -acetaminophen  (PERCOCET) 5-325 MG tablet Take 1 tablet by mouth every 4 (four) hours as needed for severe pain (pain score 7-10). 20 tablet 0   sodium bicarbonate 650 MG tablet Take 650 mg by mouth 3 (three) times daily.     tamsulosin (FLOMAX) 0.4 MG CAPS capsule Take 0.4 mg by mouth daily.     No current facility-administered medications for this visit.   Allergies:  Patient has no known allergies.   Social History: The patient  reports that he has been smoking cigarettes. He has a 10 pack-year smoking history. He has never used smokeless tobacco. He reports current alcohol use. He reports that he does not use drugs.   Family History: The patient's family history includes Diabetes in his brother.   ROS:  Please see the history of present illness. Otherwise, complete review of systems is positive for none.  All other systems are reviewed and negative.   Physical Exam: VS:  There were no vitals taken for this visit., BMI There is  no height or weight on file to calculate BMI.  Wt Readings from Last 3 Encounters:  02/04/24 202 lb 6.4 oz (91.8 kg)  11/23/23 190 lb (86.2 kg)  10/22/23 196 lb (88.9 kg)    General: Patient appears comfortable at rest. HEENT: Conjunctiva and lids normal, oropharynx clear with moist mucosa. Neck: Supple, no elevated JVP or carotid bruits, no thyromegaly. Lungs: Clear to auscultation, nonlabored breathing at rest. Cardiac: Regular rate and rhythm, no S3 or significant systolic murmur, no pericardial rub. Abdomen: Soft, nontender, no hepatomegaly, bowel sounds present, no guarding or rebound. Extremities: No pitting edema, distal pulses 2+. Skin: Warm and dry. Musculoskeletal: No kyphosis. Neuropsychiatric: Alert and oriented x3, affect grossly appropriate.  Recent Labwork: 05/31/2023: ALT 16; AST 14; B Natriuretic Peptide 327.0; BUN 31; Creatinine, Ser  5.60; Hemoglobin 7.8; Platelets 221; Potassium 3.5; Sodium 138     Component Value Date/Time   CHOL 179 08/23/2022 0612   TRIG 129 08/23/2022 0612   HDL 62 08/23/2022 0612   CHOLHDL 2.9 08/23/2022 0612   VLDL 26 08/23/2022 0612   LDLCALC 91 08/23/2022 0612     Assessment and Plan:  Noncardiac chest pain: Chest pain acute in 2023 after he had food and was vomiting. Hs troponins mildly elevated, 27 and 27.  EKG was unremarkable.  He underwent outpatient stress test in 2023 that showed no evidence of ischemia but scar cannot be completely excluded.  No large ischemic territories were noted.  No angina or DOE in the last 1 year.  HTN, poorly controlled: Currently not taking any medications.  He reported that his nephrologist manages his antihypertensive medications.  His blood pressure drops low during dialysis sessions.  Management of HTN per nephrology and PCP.  ESRD: Follows with nephrology.  Nicotine abuse: Current smoker.  Counseling.    Medication Adjustments/Labs and Tests Ordered: Current medicines are reviewed at length  with the patient today.  Concerns regarding medicines are outlined above.    Disposition:  Follow up as needed  Signed, Erich Kochan Arleta Maywood, MD, 05/15/2024 9:19 AM    [facility] at [facility] [address], [place], [place] [address]

## 2024-05-15 NOTE — Patient Instructions (Signed)
 Medication Instructions:  No medication changes were made during today's visit.  *If you need a refill on your cardiac medications before your next appointment, please call your pharmacy*   Lab Work: No labs were ordered during today's visit.  If you have labs (blood work) drawn today and your tests are completely normal, you will receive your results only by: MyChart Message (if you have MyChart) OR A paper copy in the mail If you have any lab test that is abnormal or we need to change your treatment, we will call you to review the results.   Testing/Procedures: No procedures were ordered during today's visit.    Follow-Up: At [facility], you and your health needs are our priority.  As part of our continuing mission to provide you with exceptional heart care, we have created designated Provider Care Teams.  These Care Teams include your primary Cardiologist (physician) and Advanced Practice Providers (APPs -  Physician Assistants and Nurse Practitioners) who all work together to provide you with the care you need, when you need it.  We recommend signing up for the patient portal called MyChart.  Sign up information is provided on this After Visit Summary.  MyChart is used to connect with patients for Virtual Visits (Telemedicine).  Patients are able to view lab/test results, encounter notes, upcoming appointments, etc.  Non-urgent messages can be sent to your provider as well.   To learn more about what you can do with MyChart, go to [email or web address].    Your next appointment:  return as needed    Other Instructions Thank you for choosing [facility]!

## 2024-05-16 DIAGNOSIS — D631 Anemia in chronic kidney disease: Secondary | ICD-10-CM | POA: Diagnosis not present

## 2024-05-16 DIAGNOSIS — N186 End stage renal disease: Secondary | ICD-10-CM | POA: Diagnosis not present

## 2024-05-16 DIAGNOSIS — E1122 Type 2 diabetes mellitus with diabetic chronic kidney disease: Secondary | ICD-10-CM | POA: Diagnosis not present

## 2024-05-16 DIAGNOSIS — N2581 Secondary hyperparathyroidism of renal origin: Secondary | ICD-10-CM | POA: Diagnosis not present

## 2024-05-16 DIAGNOSIS — R197 Diarrhea, unspecified: Secondary | ICD-10-CM | POA: Diagnosis not present

## 2024-05-16 DIAGNOSIS — R52 Pain, unspecified: Secondary | ICD-10-CM | POA: Diagnosis not present

## 2024-05-16 DIAGNOSIS — Z992 Dependence on renal dialysis: Secondary | ICD-10-CM | POA: Diagnosis not present

## 2024-05-16 DIAGNOSIS — D689 Coagulation defect, unspecified: Secondary | ICD-10-CM | POA: Diagnosis not present

## 2024-05-17 DIAGNOSIS — N186 End stage renal disease: Secondary | ICD-10-CM | POA: Diagnosis not present

## 2024-05-17 DIAGNOSIS — R52 Pain, unspecified: Secondary | ICD-10-CM | POA: Diagnosis not present

## 2024-05-17 DIAGNOSIS — E1122 Type 2 diabetes mellitus with diabetic chronic kidney disease: Secondary | ICD-10-CM | POA: Diagnosis not present

## 2024-05-17 DIAGNOSIS — R197 Diarrhea, unspecified: Secondary | ICD-10-CM | POA: Diagnosis not present

## 2024-05-17 DIAGNOSIS — Z992 Dependence on renal dialysis: Secondary | ICD-10-CM | POA: Diagnosis not present

## 2024-05-17 DIAGNOSIS — N2581 Secondary hyperparathyroidism of renal origin: Secondary | ICD-10-CM | POA: Diagnosis not present

## 2024-05-17 DIAGNOSIS — D689 Coagulation defect, unspecified: Secondary | ICD-10-CM | POA: Diagnosis not present

## 2024-05-17 DIAGNOSIS — D631 Anemia in chronic kidney disease: Secondary | ICD-10-CM | POA: Diagnosis not present

## 2024-05-19 ENCOUNTER — Ambulatory Visit (INDEPENDENT_AMBULATORY_CARE_PROVIDER_SITE_OTHER): Admitting: Orthopedic Surgery

## 2024-05-19 DIAGNOSIS — G5601 Carpal tunnel syndrome, right upper limb: Secondary | ICD-10-CM | POA: Diagnosis not present

## 2024-05-19 DIAGNOSIS — R2231 Localized swelling, mass and lump, right upper limb: Secondary | ICD-10-CM | POA: Diagnosis not present
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
## 2024-05-20 DIAGNOSIS — E1122 Type 2 diabetes mellitus with diabetic chronic kidney disease: Secondary | ICD-10-CM | POA: Diagnosis not present

## 2024-05-20 DIAGNOSIS — R197 Diarrhea, unspecified: Secondary | ICD-10-CM | POA: Diagnosis not present

## 2024-05-20 DIAGNOSIS — D689 Coagulation defect, unspecified: Secondary | ICD-10-CM | POA: Diagnosis not present

## 2024-05-20 DIAGNOSIS — N186 End stage renal disease: Secondary | ICD-10-CM | POA: Diagnosis not present

## 2024-05-20 DIAGNOSIS — N2581 Secondary hyperparathyroidism of renal origin: Secondary | ICD-10-CM | POA: Diagnosis not present

## 2024-05-20 DIAGNOSIS — Z992 Dependence on renal dialysis: Secondary | ICD-10-CM | POA: Diagnosis not present

## 2024-05-20 DIAGNOSIS — R52 Pain, unspecified: Secondary | ICD-10-CM | POA: Diagnosis not present

## 2024-05-20 DIAGNOSIS — D631 Anemia in chronic kidney disease: Secondary | ICD-10-CM | POA: Diagnosis not present

## 2024-05-22 DIAGNOSIS — N186 End stage renal disease: Secondary | ICD-10-CM | POA: Diagnosis not present

## 2024-05-22 DIAGNOSIS — N2581 Secondary hyperparathyroidism of renal origin: Secondary | ICD-10-CM | POA: Diagnosis not present

## 2024-05-22 DIAGNOSIS — R197 Diarrhea, unspecified: Secondary | ICD-10-CM | POA: Diagnosis not present

## 2024-05-22 DIAGNOSIS — Z992 Dependence on renal dialysis: Secondary | ICD-10-CM | POA: Diagnosis not present

## 2024-05-22 DIAGNOSIS — E1122 Type 2 diabetes mellitus with diabetic chronic kidney disease: Secondary | ICD-10-CM | POA: Diagnosis not present

## 2024-05-22 DIAGNOSIS — R52 Pain, unspecified: Secondary | ICD-10-CM | POA: Diagnosis not present

## 2024-05-22 DIAGNOSIS — D689 Coagulation defect, unspecified: Secondary | ICD-10-CM | POA: Diagnosis not present

## 2024-05-22 DIAGNOSIS — D631 Anemia in chronic kidney disease: Secondary | ICD-10-CM | POA: Diagnosis not present

## 2024-05-23 NOTE — Progress Notes (Signed)
 Patient is on dialysis, [facility] does not do dialysis patients. April at Dr Agarwala's office notified.

## 2024-05-24 DIAGNOSIS — D689 Coagulation defect, unspecified: Secondary | ICD-10-CM | POA: Diagnosis not present

## 2024-05-24 DIAGNOSIS — R197 Diarrhea, unspecified: Secondary | ICD-10-CM | POA: Diagnosis not present

## 2024-05-24 DIAGNOSIS — D631 Anemia in chronic kidney disease: Secondary | ICD-10-CM | POA: Diagnosis not present

## 2024-05-24 DIAGNOSIS — Z992 Dependence on renal dialysis: Secondary | ICD-10-CM | POA: Diagnosis not present

## 2024-05-24 DIAGNOSIS — N186 End stage renal disease: Secondary | ICD-10-CM | POA: Diagnosis not present

## 2024-05-24 DIAGNOSIS — N2581 Secondary hyperparathyroidism of renal origin: Secondary | ICD-10-CM | POA: Diagnosis not present

## 2024-05-24 DIAGNOSIS — R52 Pain, unspecified: Secondary | ICD-10-CM | POA: Diagnosis not present

## 2024-05-24 DIAGNOSIS — E1122 Type 2 diabetes mellitus with diabetic chronic kidney disease: Secondary | ICD-10-CM | POA: Diagnosis not present

## 2024-05-27 DIAGNOSIS — D689 Coagulation defect, unspecified: Secondary | ICD-10-CM | POA: Diagnosis not present

## 2024-05-27 DIAGNOSIS — R197 Diarrhea, unspecified: Secondary | ICD-10-CM | POA: Diagnosis not present

## 2024-05-27 DIAGNOSIS — E1122 Type 2 diabetes mellitus with diabetic chronic kidney disease: Secondary | ICD-10-CM | POA: Diagnosis not present

## 2024-05-27 DIAGNOSIS — N2581 Secondary hyperparathyroidism of renal origin: Secondary | ICD-10-CM | POA: Diagnosis not present

## 2024-05-27 DIAGNOSIS — R52 Pain, unspecified: Secondary | ICD-10-CM | POA: Diagnosis not present

## 2024-05-27 DIAGNOSIS — N186 End stage renal disease: Secondary | ICD-10-CM | POA: Diagnosis not present

## 2024-05-27 DIAGNOSIS — Z992 Dependence on renal dialysis: Secondary | ICD-10-CM | POA: Diagnosis not present

## 2024-05-27 DIAGNOSIS — D631 Anemia in chronic kidney disease: Secondary | ICD-10-CM | POA: Diagnosis not present

## 2024-05-29 ENCOUNTER — Telehealth: Payer: Self-pay | Admitting: Orthopedic Surgery

## 2024-05-29 DIAGNOSIS — D631 Anemia in chronic kidney disease: Secondary | ICD-10-CM | POA: Diagnosis not present

## 2024-05-29 DIAGNOSIS — R197 Diarrhea, unspecified: Secondary | ICD-10-CM | POA: Diagnosis not present

## 2024-05-29 DIAGNOSIS — D689 Coagulation defect, unspecified: Secondary | ICD-10-CM | POA: Diagnosis not present

## 2024-05-29 DIAGNOSIS — Z992 Dependence on renal dialysis: Secondary | ICD-10-CM | POA: Diagnosis not present

## 2024-05-29 DIAGNOSIS — R52 Pain, unspecified: Secondary | ICD-10-CM | POA: Diagnosis not present

## 2024-05-29 DIAGNOSIS — E1122 Type 2 diabetes mellitus with diabetic chronic kidney disease: Secondary | ICD-10-CM | POA: Diagnosis not present

## 2024-05-29 DIAGNOSIS — N186 End stage renal disease: Secondary | ICD-10-CM | POA: Diagnosis not present

## 2024-05-29 DIAGNOSIS — N2581 Secondary hyperparathyroidism of renal origin: Secondary | ICD-10-CM | POA: Diagnosis not present

## 2024-05-29 NOTE — Telephone Encounter (Signed)
 FYI

## 2024-05-29 NOTE — Telephone Encounter (Signed)
 Patient's surgery had to be canceled at [facility] on 8/1 due to patient being on Dialysis. I have tried reaching the patient to reschedule surgery at [facility], but have had to leave a message on his voicemail for three days now with no success. (05/27/24, 05/28/24, 05/29/24) Emergency contact was also tried, but number just rang with no answer or voicemail.

## 2024-05-30 ENCOUNTER — Encounter (HOSPITAL_COMMUNITY): Payer: Self-pay | Admitting: Orthopedic Surgery

## 2024-05-30 ENCOUNTER — Telehealth: Payer: Self-pay

## 2024-05-30 DIAGNOSIS — Z992 Dependence on renal dialysis: Secondary | ICD-10-CM | POA: Diagnosis not present

## 2024-05-30 DIAGNOSIS — N186 End stage renal disease: Secondary | ICD-10-CM | POA: Diagnosis not present

## 2024-05-30 NOTE — Telephone Encounter (Signed)
 Patient called asking about his surgery being canceled. He gave me his number [phone]. Stated he rides his bike a lot and doesn't hear the phone sometimes. He is aware that someone will be calling him back.

## 2024-05-30 NOTE — Telephone Encounter (Signed)
 I spoke with the patient and advised him surgery has to be moved from [facility] due to him being a Dialysis patient. He will have to go to [facility]. Patient has been rescheduled for 06/03/24 at [facility]. All questions were answered.

## 2024-05-30 NOTE — Telephone Encounter (Signed)
 FYI: Patient called back today and has been rescheduled for surgery on 06/03/24 at [facility].

## 2024-05-31 DIAGNOSIS — E039 Hypothyroidism, unspecified: Secondary | ICD-10-CM | POA: Diagnosis not present

## 2024-05-31 DIAGNOSIS — R110 Nausea: Secondary | ICD-10-CM | POA: Diagnosis not present

## 2024-05-31 DIAGNOSIS — R197 Diarrhea, unspecified: Secondary | ICD-10-CM | POA: Diagnosis not present

## 2024-05-31 DIAGNOSIS — E1122 Type 2 diabetes mellitus with diabetic chronic kidney disease: Secondary | ICD-10-CM | POA: Diagnosis not present

## 2024-05-31 DIAGNOSIS — Z992 Dependence on renal dialysis: Secondary | ICD-10-CM | POA: Diagnosis not present

## 2024-05-31 DIAGNOSIS — R52 Pain, unspecified: Secondary | ICD-10-CM | POA: Diagnosis not present

## 2024-05-31 DIAGNOSIS — N2581 Secondary hyperparathyroidism of renal origin: Secondary | ICD-10-CM | POA: Diagnosis not present

## 2024-05-31 DIAGNOSIS — D689 Coagulation defect, unspecified: Secondary | ICD-10-CM | POA: Diagnosis not present

## 2024-06-02 ENCOUNTER — Other Ambulatory Visit: Payer: Self-pay

## 2024-06-02 ENCOUNTER — Encounter (HOSPITAL_COMMUNITY): Payer: Self-pay | Admitting: Orthopedic Surgery

## 2024-06-02 DIAGNOSIS — G5601 Carpal tunnel syndrome, right upper limb: Secondary | ICD-10-CM

## 2024-06-02 NOTE — Anesthesia Preprocedure Evaluation (Addendum)
 Anesthesia Evaluation  Patient identified by MRN, date of birth, ID band Patient awake    Reviewed: Allergy & Precautions, NPO status , Patient's Chart, lab work & pertinent test results, reviewed documented beta blocker date and time   Airway Mallampati: I  TM Distance: >3 FB Neck ROM: Full    Dental  (+) Missing, Chipped, Poor Dentition, Dental Advisory Given   Pulmonary Current Smoker and Patient abstained from smoking.   Pulmonary exam normal breath sounds clear to auscultation       Cardiovascular hypertension, Pt. on home beta blockers and Pt. on medications +CHF  Normal cardiovascular exam Rhythm:Regular Rate:Normal  TTE 2023  1. Left ventricular ejection fraction, by estimation, is 60 to 65%. The  left ventricle has normal function. The left ventricle has no regional  wall motion abnormalities. There is moderate left ventricular hypertrophy.  Left ventricular diastolic  parameters are consistent with Grade I diastolic dysfunction (impaired  relaxation).   2. Right ventricular systolic function is normal. The right ventricular  size is mildly enlarged.   3. Left atrial size was moderately dilated.   4. Right atrial size was moderately dilated.   5. The mitral valve is normal in structure. No evidence of mitral valve  regurgitation. No evidence of mitral stenosis.   6. The aortic valve is normal in structure. Aortic valve regurgitation is  not visualized. No aortic stenosis is present.   7. The inferior vena cava is normal in size with greater than 50%  respiratory variability, suggesting right atrial pressure of 3 mmHg.     Neuro/Psych  PSYCHIATRIC DISORDERS Anxiety   Schizophrenia Dementia negative neurological ROS     GI/Hepatic ,GERD  ,,(+) Hepatitis -, C, B  Endo/Other  diabetes, Well ControlledHypothyroidism    Renal/GU ESRF and DialysisRenal disease (diaylsis TThS, last dialysis saturday)  negative  genitourinary   Musculoskeletal negative musculoskeletal ROS (+)    Abdominal   Peds  Hematology  (+) Blood dyscrasia, anemia   Anesthesia Other Findings   Reproductive/Obstetrics                              Anesthesia Physical Anesthesia Plan  ASA: 3  Anesthesia Plan: General   Post-op Pain Management: Tylenol  PO (pre-op)*   Induction: Intravenous  PONV Risk Score and Plan: 1 and Ondansetron , Dexamethasone  and Midazolam   Airway Management Planned: LMA  Additional Equipment:   Intra-op Plan:   Post-operative Plan: Extubation in OR  Informed Consent: I have reviewed the patients History and Physical, chart, labs and discussed the procedure including the risks, benefits and alternatives for the proposed anesthesia with the patient or authorized representative who has indicated his/her understanding and acceptance.     Dental advisory given  Plan Discussed with: CRNA  Anesthesia Plan Comments: (PAT note written 06/02/2024 by Allison Zelenak, PA-C.  Surgeon requests GA)         Anesthesia Quick Evaluation

## 2024-06-02 NOTE — Progress Notes (Signed)
 SDW CALL  Patient was given pre-op instructions over the phone. Patient verbalized understanding of instructions provided.   PCP - Dr. Suzzane Blanch Cardiologist - Dr. Chandra last OV 05/15/24   Chest x-ray - 05/31/23 EKG - 05/15/24 Stress Test - 08/31/2022 ECHO - 08/23/2022 Cardiac Cath -   Sleep Study - denies CPAP -   Fasting Blood Sugar - Does not check sugar, reports he is no longer a diabetic  Diaylsis: Fistula Left side, T, TH, Sat treatments. Received full treatment on 05/31/24  Blood Thinner Instructions: Aspirin  Instructions: Unsure, No med list in front of him  ERAS Protcol -  clears until 915 PRE-SURGERY Ensure or G2-   COVID TEST- n/a   Patient denies shortness of breath, fever, cough and chest pain over the phone call.  Pt states that [facility] transportation will take him/wait in the parking lot/will transport home. States his home nurse will be staying with him a discharge.

## 2024-06-02 NOTE — Progress Notes (Signed)
 Anesthesia Chart Review: SAME DAY WORK-UP  Case: [id number] Date/Time: 06/03/24 1200   Procedures:      CARPAL TUNNEL RELEASE (Right) - RIGHT OPEN CARPAL TUNNEL RELEASE, RIGHT HAND MASS EXCISION     REMOVAL, CYST, HAND (Right)   Anesthesia type: General   Diagnosis:      Carpal tunnel syndrome on right [G56.01]     Mass of hand, right [R22.31]   Pre-op diagnosis: RIGHT CARPAL TUNNEL SYNDROME AND RIGHT HAND MASS   [facility] / [facility]   Surgeons: Arlinda Buster, MD       DISCUSSION: Patient is a 68 year old male scheduled for the above procedure.  Surgery was rescheduled from 05/30/24 to 06/03/24 after being moved to [facility] OR due to ESRD.  History includes smoking, HTN, ESRD (HD TTS; left forearm AVGG 03/13/23), hepatitis (B & C), anemia, chronic back pain, anxiety, DM (in remission; A1c 4.9% on 12/20/23).  His last cardiology follow-up with Dr. Mallipeddi was on 05/15/24 for HTN and chest pain history. HTN poorly controlled, but not always taking medication due to hypotension with dialysis. Continued HTN management per primary care and nephrology. In regards to chest pain history,  she wrote, Noncardiac chest pain: Chest pain acute in 2023 after he had food and was vomiting. Hs troponins mildly elevated, 27 and 27.  EKG was unremarkable.  He underwent outpatient stress test in 2023 that showed no evidence of ischemia but scar cannot be completely excluded.  No large ischemic territories were noted.  No angina or DOE in the last 1 year. As needed follow-up planned.  BP on 05/15/24 was 142/80.  He was unsure of when his last aspirin  dose was.  Last known full HD was on 05/31/24. He is a same day work-up. He will get labs and anesthesia team evaluation on the day of surgery. Definitive plan at that time.    VS:  BP Readings from Last 3 Encounters:  05/15/24 (!) 142/80  02/04/24 138/84  11/23/23 (!) 204/92   Pulse Readings from Last 3 Encounters:  05/15/24 100  02/04/24 98   11/23/23 96     PROVIDERS: [name] [name], MD is PCP Stacia Beauvais, MD is  cardiologist   LABS: For day of surgery. In Care Everywhere, glucose 88 on 05/16/24, TSH 1.036 on 03/20/24, A1c 4.9% on 12/20/23, H/H 11.2/33.6 on 05/27/24.    IMAGES: CT Head/Maxillofacial/C-spine 11/23/23: IMPRESSION: 1. No acute intracranial process. 2. Comminuted fracture of the left zygomatic arch, which is mildly foreshortened. 3. Fractures of the left anterior and lateral walls of the left maxillary sinus, with additional possible fracture of the medial wall. The anterior wall fracture extends to the inferior left orbit, and is in close proximity to the left infraorbital canal. 4. Stroke fracture involving the inferior anterior orbital rim, with additional disruption of the more posterior inferior orbital rim. The inferior rectus muscle is in close proximity to this disruption. Correlate with physical exam for extraocular muscle entrapment. 5. Hematoma and stranding extending from the left forehead/ to the periorbital, premalar, buccal, and pre mandibular soft tissues. No evidence of foreign body. 6. No acute fracture or traumatic listhesis in the cervical spine.    EKG: 04/15/24: Normal sinus rhythm Left anterior fascicular block Minimal voltage criteria for LVH, may be normal variant ( Cornell product ) When compared with ECG of 31-May-2023 06:19, No significant change was found Confirmed by Mallipeddi, Vishnu Priya [phone]) on 05/15/2024 9:34:04 AM  CV: Nuclear stress test  08/31/22:   Findings are equivocal. The study is low risk.   No ST deviation was noted. Limited and brief episodes of second-degree type I block following Lexiscan .   LV perfusion is equivocal.  Small, mild intensity, apical to basal inferior defect that is more prominent at rest than stress suggesting soft tissue/diaphragmatic attenuation.  Cannot completely exclude scar, but no large ischemic territories.   Left  ventricular function is low normal. Nuclear stress EF: 52 %.   Low risk study with equivocal inferior perfusion defect as outlined and LVEF 52%.   Echo 08/23/22: IMPRESSIONS   1. Left ventricular ejection fraction, by estimation, is 60 to 65%. The  left ventricle has normal function. The left ventricle has no regional  wall motion abnormalities. There is moderate left ventricular hypertrophy.  Left ventricular diastolic  parameters are consistent with Grade I diastolic dysfunction (impaired  relaxation).   2. Right ventricular systolic function is normal. The right ventricular  size is mildly enlarged.   3. Left atrial size was moderately dilated.   4. Right atrial size was moderately dilated.   5. The mitral valve is normal in structure. No evidence of mitral valve  regurgitation. No evidence of mitral stenosis.   6. The aortic valve is normal in structure. Aortic valve regurgitation is  not visualized. No aortic stenosis is present.   7. The inferior vena cava is normal in size with greater than 50%  respiratory variability, suggesting right atrial pressure of 3 mmHg.    Past Medical History:  Diagnosis Date   Anemia    Anxiety    Chronic back pain    Dementia    Nursing facility feels he has Dementia   Diabetes mellitus    Pt reports no longer has   Facial numbness    Fracture of right foot    Hepatitis    B and C   Hypertension    Mental disorder    schizophrenia   Renal insufficiency    Dialysis T, TH, Sat    Past Surgical History:  Procedure Laterality Date   AV FISTULA PLACEMENT Left 12/20/2021   Procedure: LEFT ARM ARTERIOVENOUS (AV) FISTULA CREATION;  Surgeon: Oris Krystal FALCON, MD;  Location: [facility];  Service: Vascular;  Laterality: Left;   AV FISTULA PLACEMENT Left 03/13/2023   Procedure: INSERTION OF LEFT ARM ARTERIOVENOUS (AV) GORE-TEX GRAFT;  Surgeon: Oris Krystal FALCON, MD;  Location: [facility];  Service: Vascular;  Laterality: Left;   BIOPSY  08/02/2021    Procedure: BIOPSY;  Surgeon: Eartha Angelia Sieving, MD;  [facility];  Service: Gastroenterology;;  duodenal and gastric biopsies   COLONOSCOPY N/A 04/11/2018   Procedure: COLONOSCOPY;  Surgeon: Golda Claudis PENNER, MD;  [facility];  Service: Endoscopy;  Laterality: N/A;  10:30   COLONOSCOPY WITH PROPOFOL  N/A 07/05/2018   Procedure: COLONOSCOPY WITH PROPOFOL ;  Surgeon: Golda Claudis PENNER, MD;  [facility];  Service: Endoscopy;  Laterality: N/A;  10:35   COLONOSCOPY WITH PROPOFOL  N/A 08/02/2021   Procedure: COLONOSCOPY WITH PROPOFOL ;  Surgeon: Eartha Angelia Sieving, MD;  [facility];  Service: Gastroenterology;  Laterality: N/A;  9:05   ESOPHAGOGASTRODUODENOSCOPY (EGD) WITH PROPOFOL  N/A 08/02/2021   Procedure: ESOPHAGOGASTRODUODENOSCOPY (EGD) WITH PROPOFOL ;  Surgeon: Eartha Angelia Sieving, MD;  [facility];  Service: Gastroenterology;  Laterality: N/A;   left 1st toe     ORIF TOE FRACTURE  09/05/2011   Procedure: OPEN REDUCTION INTERNAL FIXATION (ORIF) METATARSAL (TOE)  FRACTURE;  Surgeon: Deward DELENA Schwartz;  Location: [facility];  Service: Orthopedics;  Laterality: Left;  reconstruction left great toe FHB plantar plate   PERIPHERAL VASCULAR BALLOON ANGIOPLASTY  11/02/2023   Procedure: PERIPHERAL VASCULAR BALLOON ANGIOPLASTY;  Surgeon: Melia Lynwood ORN, MD;  [facility];  Service: Cardiovascular;;   PERIPHERAL VASCULAR THROMBECTOMY N/A 11/02/2023   Procedure: PERIPHERAL VASCULAR THROMBECTOMY;  Surgeon: Melia Lynwood ORN, MD;  Location: [facility] INVASIVE CV LAB;  Service: Cardiovascular;  Laterality: N/A;   POLYPECTOMY  04/11/2018   Procedure: POLYPECTOMY;  Surgeon: Golda Claudis PENNER, MD;  [facility];  Service: Endoscopy;;  colon   POLYPECTOMY  07/05/2018   Procedure: POLYPECTOMY;  Surgeon: Golda Claudis PENNER, MD;  [facility];  Service: Endoscopy;;  colon   POLYPECTOMY  08/02/2021   Procedure: POLYPECTOMY;   Surgeon: Eartha Flavors, Toribio, MD;  [facility];  Service: Gastroenterology;;  transverse colon polyp x 1 ascending colon polyp   right foot suger      MEDICATIONS: No current facility-administered medications for this encounter.    aspirin  EC 81 MG tablet   AURYXIA 1 GM 210 MG(Fe) tablet   carvedilol  (COREG ) 6.25 MG tablet   Cholecalciferol  25 MCG (1000 UT) capsule   ferrous sulfate  325 (65 FE) MG tablet   furosemide (LASIX) 20 MG tablet   gabapentin  (NEURONTIN ) 300 MG capsule   Methoxy PEG-Epoetin  Beta (MIRCERA IJ)   NIFEdipine  (PROCARDIA  XL/NIFEDICAL XL) 60 MG 24 hr tablet   omeprazole  (PRILOSEC) 40 MG capsule   OVER THE COUNTER MEDICATION   oxyCODONE -acetaminophen  (PERCOCET) 5-325 MG tablet   sodium bicarbonate 650 MG tablet   tamsulosin (FLOMAX) 0.4 MG CAPS capsule    Isaiah Ruder, PA-C Surgical Short Stay/Anesthesiology [facility] Phone [phone] [facility] Phone [phone] 06/02/2024 1:38 PM

## 2024-06-03 ENCOUNTER — Encounter (HOSPITAL_COMMUNITY)
Admission: RE | Disposition: A | Discharge status: Home / Self Care | Payer: Self-pay | Source: Home / Self Care | Attending: Orthopedic Surgery

## 2024-06-03 ENCOUNTER — Ambulatory Visit (HOSPITAL_BASED_OUTPATIENT_CLINIC_OR_DEPARTMENT_OTHER): Admitting: Vascular Surgery

## 2024-06-03 ENCOUNTER — Encounter (HOSPITAL_COMMUNITY): Payer: Self-pay | Admitting: Orthopedic Surgery

## 2024-06-03 ENCOUNTER — Ambulatory Visit (HOSPITAL_COMMUNITY): Admitting: Vascular Surgery

## 2024-06-03 ENCOUNTER — Other Ambulatory Visit: Payer: Self-pay

## 2024-06-03 ENCOUNTER — Ambulatory Visit (HOSPITAL_COMMUNITY)
Admission: RE | Admit: 2024-06-03 | Discharge: 2024-06-03 | Disposition: A | Discharge status: Home / Self Care | Attending: Orthopedic Surgery | Admitting: Orthopedic Surgery

## 2024-06-03 DIAGNOSIS — I132 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 5 chronic kidney disease, or end stage renal disease: Secondary | ICD-10-CM | POA: Diagnosis not present

## 2024-06-03 DIAGNOSIS — L859 Epidermal thickening, unspecified: Secondary | ICD-10-CM | POA: Insufficient documentation

## 2024-06-03 DIAGNOSIS — M549 Dorsalgia, unspecified: Secondary | ICD-10-CM | POA: Insufficient documentation

## 2024-06-03 DIAGNOSIS — I5032 Chronic diastolic (congestive) heart failure: Secondary | ICD-10-CM | POA: Diagnosis not present

## 2024-06-03 DIAGNOSIS — Z79899 Other long term (current) drug therapy: Secondary | ICD-10-CM | POA: Insufficient documentation

## 2024-06-03 DIAGNOSIS — I509 Heart failure, unspecified: Secondary | ICD-10-CM | POA: Insufficient documentation

## 2024-06-03 DIAGNOSIS — F17210 Nicotine dependence, cigarettes, uncomplicated: Secondary | ICD-10-CM | POA: Insufficient documentation

## 2024-06-03 DIAGNOSIS — D631 Anemia in chronic kidney disease: Secondary | ICD-10-CM | POA: Insufficient documentation

## 2024-06-03 DIAGNOSIS — Z8619 Personal history of other infectious and parasitic diseases: Secondary | ICD-10-CM | POA: Insufficient documentation

## 2024-06-03 DIAGNOSIS — R2231 Localized swelling, mass and lump, right upper limb: Secondary | ICD-10-CM | POA: Diagnosis not present

## 2024-06-03 DIAGNOSIS — Z7982 Long term (current) use of aspirin: Secondary | ICD-10-CM | POA: Diagnosis not present

## 2024-06-03 DIAGNOSIS — N186 End stage renal disease: Secondary | ICD-10-CM

## 2024-06-03 DIAGNOSIS — Z992 Dependence on renal dialysis: Secondary | ICD-10-CM | POA: Insufficient documentation

## 2024-06-03 DIAGNOSIS — G8929 Other chronic pain: Secondary | ICD-10-CM | POA: Diagnosis not present

## 2024-06-03 DIAGNOSIS — G5601 Carpal tunnel syndrome, right upper limb: Secondary | ICD-10-CM | POA: Diagnosis not present

## 2024-06-03 DIAGNOSIS — E1122 Type 2 diabetes mellitus with diabetic chronic kidney disease: Secondary | ICD-10-CM | POA: Insufficient documentation

## 2024-06-03 HISTORY — PX: CYST REMOVAL HAND: SHX6279

## 2024-06-03 HISTORY — PX: CARPAL TUNNEL RELEASE: SHX101

## 2024-06-03 HISTORY — DX: Inflammatory liver disease, unspecified: K75.9

## 2024-06-03 LAB — POCT I-STAT, CHEM 8
BUN: 43 mg/dL — ABNORMAL HIGH (ref 8–23)
Calcium, Ion: 1.11 mmol/L — ABNORMAL LOW (ref 1.15–1.40)
Chloride: 93 mmol/L — ABNORMAL LOW (ref 98–111)
Creatinine, Ser: 11.7 mg/dL — ABNORMAL HIGH (ref 0.61–1.24)
Glucose, Bld: 82 mg/dL (ref 70–99)
HCT: 32 % — ABNORMAL LOW (ref 39.0–52.0)
Hemoglobin: 10.9 g/dL — ABNORMAL LOW (ref 13.0–17.0)
Potassium: 5.5 mmol/L — ABNORMAL HIGH (ref 3.5–5.1)
Sodium: 129 mmol/L — ABNORMAL LOW (ref 135–145)
TCO2: 29 mmol/L (ref 22–32)

## 2024-06-03 LAB — GLUCOSE, CAPILLARY
Glucose-Capillary: 90 mg/dL (ref 70–99)
Glucose-Capillary: 79 mg/dL (ref 70–99)

## 2024-06-03 SURGERY — CARPAL TUNNEL RELEASE
Anesthesia: General | Site: Hand | Laterality: Right

## 2024-06-03 MED ORDER — PHENYLEPHRINE 80 MCG/ML (10ML) SYRINGE FOR IV PUSH (FOR BLOOD PRESSURE SUPPORT)
PREFILLED_SYRINGE | INTRAVENOUS | Status: DC | PRN
  Administered 2024-06-03: 80 ug via INTRAVENOUS

## 2024-06-03 MED ORDER — OXYCODONE HCL 5 MG PO TABS
5.0000 mg | ORAL_TABLET | Freq: Four times a day (QID) | ORAL | 0 refills | Status: DC | PRN
Start: 2024-06-03 — End: 2024-08-28

## 2024-06-03 MED ORDER — LIDOCAINE-EPINEPHRINE (PF) 1 %-1:200000 IJ SOLN
INTRAMUSCULAR | Status: DC | PRN
  Administered 2024-06-03: 10 mL via INTRADERMAL

## 2024-06-03 MED ORDER — VANCOMYCIN HCL 500 MG IV SOLR
INTRAVENOUS | Status: DC | PRN
  Administered 2024-06-03: 500 mg via TOPICAL

## 2024-06-03 MED ORDER — SODIUM CHLORIDE 0.9 % IV SOLN
INTRAVENOUS | Status: DC | PRN
Start: 2024-06-03 — End: 2024-06-03

## 2024-06-03 MED ORDER — VANCOMYCIN HCL 500 MG IV SOLR
INTRAVENOUS | Status: AC
Start: 2024-06-03 — End: 2024-06-03
  Filled 2024-06-03: qty 10

## 2024-06-03 MED ORDER — CEFAZOLIN SODIUM-DEXTROSE 2-4 GM/100ML-% IV SOLN
2.0000 g | INTRAVENOUS | Status: AC
  Administered 2024-06-03: 2 g via INTRAVENOUS
  Filled 2024-06-03: qty 100

## 2024-06-03 MED ORDER — CEPHALEXIN 750 MG PO CAPS: 750.0000 mg | ORAL_CAPSULE | Freq: Four times a day (QID) | ORAL | 0 refills | Status: DC

## 2024-06-03 MED ORDER — FENTANYL CITRATE (PF) 250 MCG/5ML IJ SOLN
INTRAMUSCULAR | Status: DC | PRN
  Administered 2024-06-03: 50 ug via INTRAVENOUS
  Administered 2024-06-03: 25 ug via INTRAVENOUS

## 2024-06-03 MED ORDER — INSULIN ASPART 100 UNIT/ML IJ SOLN: 0.0000 [IU] | INTRAMUSCULAR | Status: DC | PRN

## 2024-06-03 MED ORDER — LIDOCAINE HCL 1 % IJ SOLN
INTRAMUSCULAR | Status: AC
  Filled 2024-06-03: qty 20

## 2024-06-03 MED ORDER — ALBUMIN HUMAN 5 % IV SOLN
INTRAVENOUS | Status: DC | PRN
Start: 2024-06-03 — End: 2024-06-03

## 2024-06-03 MED ORDER — CHLORHEXIDINE GLUCONATE 0.12 % MT SOLN
15.0000 mL | OROMUCOSAL | Status: AC
  Administered 2024-06-03: 15 mL via OROMUCOSAL
  Filled 2024-06-03 (×2): qty 15

## 2024-06-03 MED ORDER — FENTANYL CITRATE (PF) 250 MCG/5ML IJ SOLN
INTRAMUSCULAR | Status: AC
  Filled 2024-06-03: qty 5

## 2024-06-03 MED ORDER — ONDANSETRON HCL 4 MG/2ML IJ SOLN
INTRAMUSCULAR | Status: DC | PRN
  Administered 2024-06-03: 4 mg via INTRAVENOUS

## 2024-06-03 MED ORDER — SODIUM CHLORIDE 0.9 % IV SOLN: INTRAVENOUS | Status: DC

## 2024-06-03 MED ORDER — EPHEDRINE SULFATE-NACL 50-0.9 MG/10ML-% IV SOSY
PREFILLED_SYRINGE | INTRAVENOUS | Status: DC | PRN
  Administered 2024-06-03 (×2): 5 mg via INTRAVENOUS

## 2024-06-03 MED ORDER — LIDOCAINE 2% (20 MG/ML) 5 ML SYRINGE
INTRAMUSCULAR | Status: DC | PRN
  Administered 2024-06-03: 40 mg via INTRAVENOUS

## 2024-06-03 MED ORDER — PHENYLEPHRINE HCL-NACL 20-0.9 MG/250ML-% IV SOLN
INTRAVENOUS | Status: DC | PRN
  Administered 2024-06-03: 50 ug/min via INTRAVENOUS

## 2024-06-03 MED ORDER — PROPOFOL 10 MG/ML IV BOLUS
INTRAVENOUS | Status: DC | PRN
  Administered 2024-06-03: 150 mg via INTRAVENOUS

## 2024-06-03 SURGICAL SUPPLY — 37 items
BLADE MINI RND TIP GREEN BEAV (BLADE) ×1 IMPLANT
BLADE SURG 15 STRL LF DISP TIS (BLADE) ×2 IMPLANT
BNDG COHESIVE 4X5 TAN STRL LF (GAUZE/BANDAGES/DRESSINGS) ×1 IMPLANT
BNDG COMPR ESMARK 4X3 LF (GAUZE/BANDAGES/DRESSINGS) ×1 IMPLANT
BNDG ELASTIC 4INX 5YD STR LF (GAUZE/BANDAGES/DRESSINGS) ×1 IMPLANT
BNDG GAUZE DERMACEA FLUFF 4 (GAUZE/BANDAGES/DRESSINGS) ×1 IMPLANT
CHLORAPREP W/TINT 26 (MISCELLANEOUS) ×1 IMPLANT
CORD BIPOLAR FORCEPS 12FT (ELECTRODE) ×1 IMPLANT
COVER BACK TABLE 60X90IN (DRAPES) ×1 IMPLANT
CUFF TOURN SGL QUICK 18X4 (TOURNIQUET CUFF) IMPLANT
DRAPE SURG 17X23 STRL (DRAPES) ×1 IMPLANT
GAUZE 4X4 16PLY ~~LOC~~+RFID DBL (SPONGE) IMPLANT
GAUZE PAD ABD 8X10 STRL (GAUZE/BANDAGES/DRESSINGS) ×1 IMPLANT
GAUZE SPONGE 4X4 12PLY STRL (GAUZE/BANDAGES/DRESSINGS) ×1 IMPLANT
GAUZE STRETCH 2X75IN STRL (MISCELLANEOUS) ×1 IMPLANT
GAUZE XEROFORM 1X8 LF (GAUZE/BANDAGES/DRESSINGS) ×1 IMPLANT
GLOVE BIO SURGEON STRL SZ7.5 (GLOVE) ×2 IMPLANT
GLOVE BIOGEL PI IND STRL 7.5 (GLOVE) ×2 IMPLANT
GOWN STRL REUS W/ TWL LRG LVL3 (GOWN DISPOSABLE) ×1 IMPLANT
GOWN STRL REUS W/ TWL XL LVL3 (GOWN DISPOSABLE) ×1 IMPLANT
GOWN STRL SURGICAL XL XLNG (GOWN DISPOSABLE) ×1 IMPLANT
KIT BASIN OR (CUSTOM PROCEDURE TRAY) ×1 IMPLANT
NDL HYPO 25X5/8 SAFETYGLIDE (NEEDLE) IMPLANT
NEEDLE HYPO 25X5/8 SAFETYGLIDE (NEEDLE) ×1 IMPLANT
NS IRRIG 1000ML POUR BTL (IV SOLUTION) IMPLANT
PACK ORTHO EXTREMITY (CUSTOM PROCEDURE TRAY) ×1 IMPLANT
SHEET MEDIUM DRAPE 40X70 STRL (DRAPES) ×1 IMPLANT
SPIKE FLUID TRANSFER (MISCELLANEOUS) IMPLANT
SPLINT PLASTER CAST XFAST 4X15 (CAST SUPPLIES) IMPLANT
STOCKINETTE IMPERVIOUS 9X36 MD (GAUZE/BANDAGES/DRESSINGS) ×1 IMPLANT
SUCTION TUBE FRAZIER 10FR DISP (SUCTIONS) IMPLANT
SUT ETHILON 4 0 PS 2 18 (SUTURE) ×1 IMPLANT
SYR BULB EAR ULCER 3OZ GRN STR (SYRINGE) ×2 IMPLANT
SYR CONTROL 10ML LL (SYRINGE) ×1 IMPLANT
TOWEL GREEN STERILE FF (TOWEL DISPOSABLE) ×2 IMPLANT
TUBE CONNECTING 20X1/4 (TUBING) IMPLANT
UNDERPAD 30X36 HEAVY ABSORB (UNDERPADS AND DIAPERS) ×1 IMPLANT

## 2024-06-03 NOTE — Interval H&P Note (Signed)
 History and Physical Interval Note:  06/03/2024 10:56 AM  Charles [name]  has presented today for surgery, with the diagnosis of RIGHT CARPAL TUNNEL SYNDROME AND RIGHT HAND MASS.  The various methods of treatment have been discussed with the patient and family. After consideration of risks, benefits and other options for treatment, the patient has consented to  Procedure(s) with comments: CARPAL TUNNEL RELEASE (Right) - RIGHT OPEN CARPAL TUNNEL RELEASE, RIGHT HAND MASS EXCISION REMOVAL, CYST, HAND (Right) as a surgical intervention.  The patient's history has been reviewed, patient examined, no change in status, stable for surgery.  I have reviewed the patient's chart and labs.  Questions were answered to the patient's satisfaction.     Cina Klumpp

## 2024-06-03 NOTE — Progress Notes (Signed)
 Called anesthesia to question where to place IV for right hand surgery today. Patient has AV fistula in left arm. Anesthesia placed call to surgeon. Awaiting to her back from surgeon and anesthesia before placing IV.

## 2024-06-03 NOTE — Progress Notes (Signed)
 Anesthesia at bedside attempting to obtain IV access in foot per Dr. Lilli request.

## 2024-06-03 NOTE — Discharge Instructions (Signed)

## 2024-06-03 NOTE — Transfer of Care (Signed)
 Immediate Anesthesia Transfer of Care Note  Patient: Charles [name]  Procedure(s) Performed: CARPAL TUNNEL RELEASE (Right: Hand) REMOVAL, CYST, HAND (Right: Hand)  Patient Location: PACU  Anesthesia Type:General  Level of Consciousness: drowsy and patient cooperative  Airway & Oxygen Therapy: Patient Spontanous Breathing and Patient connected to face mask oxygen  Post-op Assessment: Report given to RN, Post -op Vital signs reviewed and stable, Post -op Vital signs reviewed and unstable, Anesthesiologist notified, and Patient moving all extremities X 4  Post vital signs: Reviewed and stable  Last Vitals:  Vitals Value Taken Time  BP 161/48 06/03/24 13:55  Temp    Pulse 87 06/03/24 13:58  Resp 18 06/03/24 13:58  SpO2 100 % 06/03/24 13:58  Vitals shown include unfiled device data.  Last Pain:  Vitals:   06/03/24 1050  TempSrc:   PainSc: 0-No pain      Patients Stated Pain Goal: 0 (06/03/24 1050)  Complications: No notable events documented.
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
## 2024-06-03 NOTE — Anesthesia Postprocedure Evaluation (Signed)
 Anesthesia Post Note  Patient: Charles [name]  Procedure(s) Performed: CARPAL TUNNEL RELEASE (Right: Hand) REMOVAL, CYST, HAND (Right: Hand)     Patient location during evaluation: PACU Anesthesia Type: General Level of consciousness: awake and alert, oriented and patient cooperative Pain management: pain level controlled Vital Signs Assessment: post-procedure vital signs reviewed and stable Respiratory status: spontaneous breathing, nonlabored ventilation and respiratory function stable Cardiovascular status: blood pressure returned to baseline and stable Postop Assessment: no apparent nausea or vomiting Anesthetic complications: no   No notable events documented.  Last Vitals:  Vitals:   06/03/24 1400 06/03/24 1415  BP: (!) 150/52 (!) 145/64  Pulse: 88 89  Resp: 18 13  Temp:    SpO2: 100% 95%    Last Pain:  Vitals:   06/03/24 1355  TempSrc:   PainSc: Asleep                 [name]

## 2024-06-03 NOTE — Anesthesia Procedure Notes (Signed)
 Procedure Name: LMA Insertion Date/Time: 06/03/2024 12:30 PM  Performed by: Arvell Edsel HERO, CRNAPre-anesthesia Checklist: Patient identified, Emergency Drugs available, Suction available, Patient being monitored and Timeout performed Patient Re-evaluated:Patient Re-evaluated prior to induction Oxygen Delivery Method: Circle system utilized Preoxygenation: Pre-oxygenation with 100% oxygen Induction Type: IV induction LMA: LMA inserted LMA Size: 5.0 Number of attempts: 1 Placement Confirmation: positive ETCO2, CO2 detector and breath sounds checked- equal and bilateral Tube secured with: Tape

## 2024-06-04 ENCOUNTER — Telehealth: Payer: Self-pay

## 2024-06-04 ENCOUNTER — Encounter (HOSPITAL_COMMUNITY): Payer: Self-pay | Admitting: Orthopedic Surgery

## 2024-06-04 LAB — SURGICAL PATHOLOGY

## 2024-06-04 NOTE — Telephone Encounter (Signed)
LVM for pt to cb to confirm appt.

## 2024-06-05 ENCOUNTER — Ambulatory Visit: Admitting: Internal Medicine

## 2024-06-07 ENCOUNTER — Encounter (HOSPITAL_COMMUNITY): Payer: Self-pay | Admitting: Emergency Medicine

## 2024-06-07 ENCOUNTER — Emergency Department (HOSPITAL_COMMUNITY)

## 2024-06-07 ENCOUNTER — Inpatient Hospital Stay (HOSPITAL_COMMUNITY)
Admission: EM | Admit: 2024-06-07 | Discharge: 2024-06-09 | DRG: 640 | Disposition: A | Discharge status: Home / Self Care | Attending: Family Medicine | Admitting: Family Medicine

## 2024-06-07 DIAGNOSIS — E11649 Type 2 diabetes mellitus with hypoglycemia without coma: Secondary | ICD-10-CM | POA: Diagnosis present

## 2024-06-07 DIAGNOSIS — I120 Hypertensive chronic kidney disease with stage 5 chronic kidney disease or end stage renal disease: Secondary | ICD-10-CM | POA: Diagnosis not present

## 2024-06-07 DIAGNOSIS — B9681 Helicobacter pylori [H. pylori] as the cause of diseases classified elsewhere: Secondary | ICD-10-CM

## 2024-06-07 DIAGNOSIS — Z7982 Long term (current) use of aspirin: Secondary | ICD-10-CM | POA: Diagnosis not present

## 2024-06-07 DIAGNOSIS — Z79899 Other long term (current) drug therapy: Secondary | ICD-10-CM | POA: Diagnosis not present

## 2024-06-07 DIAGNOSIS — Z720 Tobacco use: Secondary | ICD-10-CM | POA: Diagnosis present

## 2024-06-07 DIAGNOSIS — F17210 Nicotine dependence, cigarettes, uncomplicated: Secondary | ICD-10-CM | POA: Diagnosis present

## 2024-06-07 DIAGNOSIS — D631 Anemia in chronic kidney disease: Secondary | ICD-10-CM | POA: Diagnosis present

## 2024-06-07 DIAGNOSIS — K219 Gastro-esophageal reflux disease without esophagitis: Secondary | ICD-10-CM | POA: Diagnosis present

## 2024-06-07 DIAGNOSIS — Z91158 Patient's noncompliance with renal dialysis for other reason: Secondary | ICD-10-CM

## 2024-06-07 DIAGNOSIS — I10 Essential (primary) hypertension: Secondary | ICD-10-CM | POA: Diagnosis present

## 2024-06-07 DIAGNOSIS — I5033 Acute on chronic diastolic (congestive) heart failure: Secondary | ICD-10-CM | POA: Diagnosis present

## 2024-06-07 DIAGNOSIS — N186 End stage renal disease: Secondary | ICD-10-CM | POA: Diagnosis present

## 2024-06-07 DIAGNOSIS — I444 Left anterior fascicular block: Secondary | ICD-10-CM | POA: Diagnosis present

## 2024-06-07 DIAGNOSIS — I132 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 5 chronic kidney disease, or end stage renal disease: Secondary | ICD-10-CM | POA: Diagnosis present

## 2024-06-07 DIAGNOSIS — D509 Iron deficiency anemia, unspecified: Secondary | ICD-10-CM | POA: Diagnosis present

## 2024-06-07 DIAGNOSIS — R0602 Shortness of breath: Secondary | ICD-10-CM | POA: Diagnosis not present

## 2024-06-07 DIAGNOSIS — Z833 Family history of diabetes mellitus: Secondary | ICD-10-CM

## 2024-06-07 DIAGNOSIS — E039 Hypothyroidism, unspecified: Secondary | ICD-10-CM | POA: Diagnosis present

## 2024-06-07 DIAGNOSIS — N250 Renal osteodystrophy: Secondary | ICD-10-CM | POA: Diagnosis not present

## 2024-06-07 DIAGNOSIS — E1122 Type 2 diabetes mellitus with diabetic chronic kidney disease: Secondary | ICD-10-CM | POA: Diagnosis present

## 2024-06-07 DIAGNOSIS — I517 Cardiomegaly: Secondary | ICD-10-CM | POA: Diagnosis not present

## 2024-06-07 DIAGNOSIS — Z860100 Personal history of colon polyps, unspecified: Secondary | ICD-10-CM

## 2024-06-07 DIAGNOSIS — E875 Hyperkalemia: Principal | ICD-10-CM | POA: Diagnosis present

## 2024-06-07 DIAGNOSIS — F1010 Alcohol abuse, uncomplicated: Secondary | ICD-10-CM | POA: Diagnosis present

## 2024-06-07 DIAGNOSIS — R0989 Other specified symptoms and signs involving the circulatory and respiratory systems: Secondary | ICD-10-CM | POA: Diagnosis not present

## 2024-06-07 DIAGNOSIS — R531 Weakness: Secondary | ICD-10-CM | POA: Diagnosis present

## 2024-06-07 DIAGNOSIS — K2970 Gastritis, unspecified, without bleeding: Secondary | ICD-10-CM

## 2024-06-07 DIAGNOSIS — G8929 Other chronic pain: Secondary | ICD-10-CM | POA: Diagnosis present

## 2024-06-07 DIAGNOSIS — Z992 Dependence on renal dialysis: Secondary | ICD-10-CM | POA: Diagnosis not present

## 2024-06-07 DIAGNOSIS — R001 Bradycardia, unspecified: Secondary | ICD-10-CM | POA: Diagnosis not present

## 2024-06-07 LAB — RENAL FUNCTION PANEL
Albumin: 3.6 g/dL (ref 3.5–5.0)
Albumin: 3.8 g/dL (ref 3.5–5.0)
Anion gap: 22 — ABNORMAL HIGH (ref 5–15)
Anion gap: 16 — ABNORMAL HIGH (ref 5–15)
BUN: 77 mg/dL — ABNORMAL HIGH (ref 8–23)
BUN: 29 mg/dL — ABNORMAL HIGH (ref 8–23)
CO2: 20 mmol/L — ABNORMAL LOW (ref 22–32)
CO2: 27 mmol/L (ref 22–32)
Calcium: 8.9 mg/dL (ref 8.9–10.3)
Calcium: 9 mg/dL (ref 8.9–10.3)
Chloride: 91 mmol/L — ABNORMAL LOW (ref 98–111)
Chloride: 90 mmol/L — ABNORMAL LOW (ref 98–111)
Creatinine, Ser: 17.79 mg/dL — ABNORMAL HIGH (ref 0.61–1.24)
Creatinine, Ser: 7.17 mg/dL — ABNORMAL HIGH (ref 0.61–1.24)
GFR, Estimated: 3 mL/min — ABNORMAL LOW (ref >60)
GFR, Estimated: 8 mL/min — ABNORMAL LOW (ref >60)
Glucose, Bld: 80 mg/dL (ref 70–99)
Glucose, Bld: 89 mg/dL (ref 70–99)
Phosphorus: 7.1 mg/dL — ABNORMAL HIGH (ref 2.5–4.6)
Phosphorus: 3.8 mg/dL (ref 2.5–4.6)
Potassium: >7.5 mmol/L (ref 3.5–5.1)
Potassium: 3.7 mmol/L (ref 3.5–5.1)
Sodium: 133 mmol/L — ABNORMAL LOW (ref 135–145)
Sodium: 133 mmol/L — ABNORMAL LOW (ref 135–145)

## 2024-06-07 LAB — GLUCOSE, CAPILLARY
Glucose-Capillary: 127 mg/dL — ABNORMAL HIGH (ref 70–99)
Glucose-Capillary: 280 mg/dL — ABNORMAL HIGH (ref 70–99)
Glucose-Capillary: 63 mg/dL — ABNORMAL LOW (ref 70–99)
Glucose-Capillary: 91 mg/dL (ref 70–99)
Glucose-Capillary: 95 mg/dL (ref 70–99)
Glucose-Capillary: 96 mg/dL (ref 70–99)
Glucose-Capillary: 49 mg/dL — ABNORMAL LOW (ref 70–99)
Glucose-Capillary: 133 mg/dL — ABNORMAL HIGH (ref 70–99)
Glucose-Capillary: 158 mg/dL — ABNORMAL HIGH (ref 70–99)

## 2024-06-07 LAB — BASIC METABOLIC PANEL WITH GFR
Anion gap: 20 — ABNORMAL HIGH (ref 5–15)
BUN: 77 mg/dL — ABNORMAL HIGH (ref 8–23)
CO2: 21 mmol/L — ABNORMAL LOW (ref 22–32)
Calcium: 8.9 mg/dL (ref 8.9–10.3)
Chloride: 92 mmol/L — ABNORMAL LOW (ref 98–111)
Creatinine, Ser: 17.54 mg/dL — ABNORMAL HIGH (ref 0.61–1.24)
GFR, Estimated: 3 mL/min — ABNORMAL LOW (ref >60)
Glucose, Bld: 81 mg/dL (ref 70–99)
Potassium: >7.5 mmol/L (ref 3.5–5.1)
Sodium: 133 mmol/L — ABNORMAL LOW (ref 135–145)

## 2024-06-07 LAB — CBC WITH DIFFERENTIAL/PLATELET
Abs Immature Granulocytes: 0.03 K/uL (ref 0.00–0.07)
Basophils Absolute: 0 K/uL (ref 0.0–0.1)
Basophils Relative: 0 %
Eosinophils Absolute: 0 K/uL (ref 0.0–0.5)
Eosinophils Relative: 0 %
HCT: 30.1 % — ABNORMAL LOW (ref 39.0–52.0)
Hemoglobin: 9.7 g/dL — ABNORMAL LOW (ref 13.0–17.0)
Immature Granulocytes: 0 %
Lymphocytes Relative: 14 %
Lymphs Abs: 1.3 K/uL (ref 0.7–4.0)
MCH: 32.9 pg (ref 26.0–34.0)
MCHC: 32.2 g/dL (ref 30.0–36.0)
MCV: 102 fL — ABNORMAL HIGH (ref 80.0–100.0)
Monocytes Absolute: 0.7 K/uL (ref 0.1–1.0)
Monocytes Relative: 7 %
Neutro Abs: 7.2 K/uL (ref 1.7–7.7)
Neutrophils Relative %: 79 %
Platelets: 163 K/uL (ref 150–400)
RBC: 2.95 MIL/uL — ABNORMAL LOW (ref 4.22–5.81)
RDW: 14.8 % (ref 11.5–15.5)
WBC: 9.3 K/uL (ref 4.0–10.5)
nRBC: 0 % (ref 0.0–0.2)

## 2024-06-07 LAB — CBG MONITORING, ED
Glucose-Capillary: 74 mg/dL (ref 70–99)
Glucose-Capillary: 42 mg/dL — CL (ref 70–99)

## 2024-06-07 LAB — TROPONIN I (HIGH SENSITIVITY)
Troponin I (High Sensitivity): 58 ng/L — ABNORMAL HIGH (ref <18)
Troponin I (High Sensitivity): 55 ng/L — ABNORMAL HIGH (ref <18)

## 2024-06-07 LAB — MAGNESIUM: Magnesium: 5.2 mg/dL — ABNORMAL HIGH (ref 1.7–2.4)

## 2024-06-07 LAB — BRAIN NATRIURETIC PEPTIDE: B Natriuretic Peptide: 1079 pg/mL — ABNORMAL HIGH (ref 0.0–100.0)

## 2024-06-07 LAB — HEPATITIS B SURFACE ANTIGEN: Hepatitis B Surface Ag: NONREACTIVE

## 2024-06-07 LAB — HIV ANTIBODY (ROUTINE TESTING W REFLEX): HIV Screen 4th Generation wRfx: NONREACTIVE

## 2024-06-07 LAB — MRSA NEXT GEN BY PCR, NASAL: MRSA by PCR Next Gen: NOT DETECTED

## 2024-06-07 MED ORDER — ADULT MULTIVITAMIN W/MINERALS CH
1.0000 | ORAL_TABLET | Freq: Every day | ORAL | Status: DC
  Administered 2024-06-07 – 2024-06-09 (×4): 1 via ORAL
  Filled 2024-06-07 (×3): qty 1

## 2024-06-07 MED ORDER — ATROPINE SULFATE 1 MG/10ML IJ SOSY
0.5000 mg | PREFILLED_SYRINGE | Freq: Once | INTRAMUSCULAR | Status: AC
  Administered 2024-06-07: 0.5 mg via INTRAVENOUS
  Filled 2024-06-07: qty 10

## 2024-06-07 MED ORDER — LORAZEPAM 2 MG/ML IJ SOLN: 1.0000 mg | INTRAMUSCULAR | Status: DC | PRN

## 2024-06-07 MED ORDER — SODIUM CHLORIDE 0.9% FLUSH
3.0000 mL | Freq: Two times a day (BID) | INTRAVENOUS | Status: DC
  Administered 2024-06-07 – 2024-06-09 (×6): 3 mL via INTRAVENOUS

## 2024-06-07 MED ORDER — SODIUM BICARBONATE 8.4 % IV SOLN
50.0000 meq | Freq: Once | INTRAVENOUS | Status: AC
  Administered 2024-06-07: 50 meq via INTRAVENOUS
  Filled 2024-06-07: qty 50

## 2024-06-07 MED ORDER — HEPARIN SODIUM (PORCINE) 5000 UNIT/ML IJ SOLN
5000.0000 [IU] | Freq: Three times a day (TID) | INTRAMUSCULAR | Status: DC
  Administered 2024-06-07 – 2024-06-09 (×6): 5000 [IU] via SUBCUTANEOUS
  Filled 2024-06-07 (×5): qty 1

## 2024-06-07 MED ORDER — CALCIUM GLUCONATE 10 % IV SOLN
1.0000 g | Freq: Once | INTRAVENOUS | Status: AC
  Administered 2024-06-07: 1 g via INTRAVENOUS
  Filled 2024-06-07: qty 10

## 2024-06-07 MED ORDER — SODIUM CHLORIDE 0.9% FLUSH
3.0000 mL | Freq: Two times a day (BID) | INTRAVENOUS | Status: DC
  Administered 2024-06-07 – 2024-06-09 (×5): 3 mL via INTRAVENOUS

## 2024-06-07 MED ORDER — DEXTROSE 50 % IV SOLN
INTRAVENOUS | Status: AC
  Administered 2024-06-07: 50 mL
  Filled 2024-06-07: qty 50

## 2024-06-07 MED ORDER — SENNOSIDES 8.6 MG PO TABS: 2.0000 | ORAL_TABLET | Freq: Every day | ORAL | Status: DC

## 2024-06-07 MED ORDER — OXYCODONE HCL 5 MG PO TABS: 5.0000 mg | ORAL_TABLET | Freq: Four times a day (QID) | ORAL | Status: DC | PRN

## 2024-06-07 MED ORDER — ONDANSETRON HCL 4 MG/2ML IJ SOLN: 4.0000 mg | Freq: Four times a day (QID) | INTRAMUSCULAR | Status: DC | PRN

## 2024-06-07 MED ORDER — TENAPANOR HCL (CKD) 30 MG PO TABS: 30.0000 mg | ORAL_TABLET | Freq: Every day | ORAL | Status: DC

## 2024-06-07 MED ORDER — BISACODYL 10 MG RE SUPP: 10.0000 mg | Freq: Every day | RECTAL | Status: DC | PRN

## 2024-06-07 MED ORDER — FOLIC ACID 1 MG PO TABS
1.0000 mg | ORAL_TABLET | Freq: Every day | ORAL | Status: DC
  Administered 2024-06-07 – 2024-06-09 (×4): 1 mg via ORAL
  Filled 2024-06-07 (×3): qty 1

## 2024-06-07 MED ORDER — THIAMINE HCL 100 MG/ML IJ SOLN: 100.0000 mg | Freq: Every day | INTRAMUSCULAR | Status: DC

## 2024-06-07 MED ORDER — DIAZEPAM 2 MG PO TABS
2.0000 mg | ORAL_TABLET | Freq: Three times a day (TID) | ORAL | Status: AC
  Administered 2024-06-07 – 2024-06-09 (×7): 2 mg via ORAL
  Filled 2024-06-07 (×6): qty 1

## 2024-06-07 MED ORDER — TRAZODONE HCL 50 MG PO TABS: 50.0000 mg | ORAL_TABLET | Freq: Every evening | ORAL | Status: DC | PRN

## 2024-06-07 MED ORDER — INSULIN ASPART 100 UNIT/ML IV SOLN
10.0000 [IU] | Freq: Once | INTRAVENOUS | Status: AC
  Administered 2024-06-07: 10 [IU] via INTRAVENOUS

## 2024-06-07 MED ORDER — DEXTROSE 50 % IV SOLN
1.0000 | Freq: Once | INTRAVENOUS | Status: AC
  Administered 2024-06-07: 50 mL via INTRAVENOUS
  Filled 2024-06-07: qty 50

## 2024-06-07 MED ORDER — ATROPINE SULFATE 1 MG/ML IV SOLN
INTRAVENOUS | Status: AC
Start: 2024-06-07 — End: 2024-06-08
  Filled 2024-06-07: qty 1

## 2024-06-07 MED ORDER — THIAMINE MONONITRATE 100 MG PO TABS
100.0000 mg | ORAL_TABLET | Freq: Every day | ORAL | Status: DC
  Administered 2024-06-07 – 2024-06-09 (×4): 100 mg via ORAL
  Filled 2024-06-07 (×3): qty 1

## 2024-06-07 MED ORDER — GLUCOSE 40 % PO GEL
2.0000 | ORAL | Status: AC
  Administered 2024-06-07: 62 g via ORAL
  Filled 2024-06-07: qty 2.42

## 2024-06-07 MED ORDER — SODIUM ZIRCONIUM CYCLOSILICATE 5 G PO PACK
10.0000 g | PACK | Freq: Once | ORAL | Status: AC
  Administered 2024-06-07: 10 g via ORAL
  Filled 2024-06-07: qty 2

## 2024-06-07 MED ORDER — SEVELAMER CARBONATE 800 MG PO TABS
800.0000 mg | ORAL_TABLET | Freq: Three times a day (TID) | ORAL | Status: DC
  Administered 2024-06-07 – 2024-06-09 (×8): 800 mg via ORAL
  Filled 2024-06-07 (×7): qty 1

## 2024-06-07 MED ORDER — HYDRALAZINE HCL 20 MG/ML IJ SOLN: 10.0000 mg | Freq: Four times a day (QID) | INTRAMUSCULAR | Status: DC | PRN

## 2024-06-07 MED ORDER — PANTOPRAZOLE SODIUM 40 MG PO TBEC
40.0000 mg | DELAYED_RELEASE_TABLET | Freq: Every day | ORAL | Status: DC
  Administered 2024-06-07 – 2024-06-09 (×4): 40 mg via ORAL
  Filled 2024-06-07 (×3): qty 1

## 2024-06-07 MED ORDER — CHLORHEXIDINE GLUCONATE CLOTH 2 % EX PADS
6.0000 | MEDICATED_PAD | Freq: Every day | CUTANEOUS | Status: DC
  Administered 2024-06-07 – 2024-06-09 (×4): 6 via TOPICAL

## 2024-06-07 MED ORDER — ACETAMINOPHEN 650 MG RE SUPP
650.0000 mg | Freq: Four times a day (QID) | RECTAL | Status: DC | PRN
Start: 2024-06-07 — End: 2024-06-09

## 2024-06-07 MED ORDER — SODIUM CHLORIDE 0.9 % IV SOLN: INTRAVENOUS | Status: AC | PRN

## 2024-06-07 MED ORDER — ALBUTEROL SULFATE (2.5 MG/3ML) 0.083% IN NEBU
10.0000 mg | INHALATION_SOLUTION | Freq: Once | RESPIRATORY_TRACT | Status: AC
  Administered 2024-06-07: 10 mg via RESPIRATORY_TRACT
  Filled 2024-06-07: qty 12

## 2024-06-07 MED ORDER — LORAZEPAM 1 MG PO TABS: 1.0000 mg | ORAL_TABLET | ORAL | Status: DC | PRN

## 2024-06-07 MED ORDER — POLYETHYLENE GLYCOL 3350 17 G PO PACK: 17.0000 g | PACK | Freq: Every day | ORAL | Status: DC | PRN

## 2024-06-07 MED ORDER — ALBUTEROL SULFATE (2.5 MG/3ML) 0.083% IN NEBU
2.5000 mg | INHALATION_SOLUTION | RESPIRATORY_TRACT | Status: DC | PRN
Start: 2024-06-07 — End: 2024-06-09

## 2024-06-07 MED ORDER — ATROPINE SULFATE 1 MG/ML IV SOLN
1.0000 mg | Freq: Once | INTRAVENOUS | Status: AC
  Administered 2024-06-07: 1 mg via INTRAVENOUS

## 2024-06-07 MED ORDER — GLUCOSE 40 % PO GEL
ORAL | Status: AC
  Filled 2024-06-07: qty 1.21

## 2024-06-07 MED ORDER — ASPIRIN 81 MG PO TBEC
81.0000 mg | DELAYED_RELEASE_TABLET | Freq: Every day | ORAL | Status: DC
  Administered 2024-06-08 – 2024-06-09 (×3): 81 mg via ORAL
  Filled 2024-06-07 (×2): qty 1

## 2024-06-07 MED ORDER — DEXTROSE 50 % IV SOLN: 50.0000 mL | Freq: Once | INTRAVENOUS | Status: AC

## 2024-06-07 MED ORDER — SODIUM CHLORIDE 0.9% FLUSH: 3.0000 mL | INTRAVENOUS | Status: DC | PRN

## 2024-06-07 MED ORDER — GABAPENTIN 300 MG PO CAPS
300.0000 mg | ORAL_CAPSULE | Freq: Every day | ORAL | Status: DC
  Administered 2024-06-07 – 2024-06-08 (×2): 300 mg via ORAL
  Filled 2024-06-07 (×2): qty 1

## 2024-06-07 MED ORDER — ACETAMINOPHEN 325 MG PO TABS: 650.0000 mg | ORAL_TABLET | Freq: Four times a day (QID) | ORAL | Status: DC | PRN

## 2024-06-07 MED ORDER — ONDANSETRON HCL 4 MG PO TABS: 4.0000 mg | ORAL_TABLET | Freq: Four times a day (QID) | ORAL | Status: DC | PRN

## 2024-06-07 MED ORDER — VITAMIN D 25 MCG (1000 UNIT) PO TABS
5000.0000 [IU] | ORAL_TABLET | Freq: Every day | ORAL | Status: DC
  Administered 2024-06-08 – 2024-06-09 (×3): 5000 [IU] via ORAL
  Filled 2024-06-07 (×2): qty 5

## 2024-06-07 MED ORDER — NICOTINE 14 MG/24HR TD PT24
14.0000 mg | MEDICATED_PATCH | Freq: Every day | TRANSDERMAL | Status: DC
  Administered 2024-06-07 – 2024-06-09 (×4): 14 mg via TRANSDERMAL
  Filled 2024-06-07 (×3): qty 1
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
## 2024-06-07 NOTE — ED Notes (Signed)
 1 mg atropine  given per EDP verbal orders
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
## 2024-06-07 NOTE — ED Notes (Signed)
 MD notified of K 7.2 on istat

## 2024-06-07 NOTE — Progress Notes (Signed)
 Date and time results received: 06/07/24 1554 (use smartphrase .now to insert current time)  Test: Potassium  Critical Value: >7.4  Name of Provider Notified: Dr Rendall  Orders Received? Or Actions Taken?: No new orders currently. Patient is starting dialysis soon per dialysis nurse.

## 2024-06-07 NOTE — Procedures (Signed)
 HD Note:  Some information was entered later than the data was gathered due to patient care needs. The stated time with the data is accurate.  Patient treatment done at bedside.   Alert to voice and able to answer basic questions prior to closing eyes again.   Informed consent signed and in chart.   Access used: Left forearm graft Access issues: None  Patient tolerated treatment well.   TX duration: 3.5 hours  Alert, without acute distress  Hand-off given to patient's nurse.   Transported back to the room   Hanae Waiters L. Lenon, RN [facility].

## 2024-06-07 NOTE — Progress Notes (Signed)
 Hypoglycemic Event  CBG: 63  Treatment: D50 50 mL (25 gm) (per verbal from Dr Rendall)  Symptoms: fatigue  Follow-up CBG: Time:1729 CBG Result:280  Possible Reasons for Event: Other: Inadaquate meal Intake and post high potassium medication regimen   Comments/MD notified:Dr Courage    Charles [name]

## 2024-06-07 NOTE — ED Provider Notes (Signed)
 [facility] Provider Note   CSN: [id number] Arrival date & time: 06/07/24  1319     Patient presents with: Weakness and Bradycardia   Charles [name] is a 68 y.o. male.   Patient has a history of renal failure.  He has not had dialysis since Monday.  He complains of weakness  The history is provided by the patient and medical records. No language interpreter was used.  Weakness Severity:  Moderate Onset quality:  Sudden Timing:  Constant Progression:  Worsening Chronicity:  New Context: not alcohol use   Relieved by:  Nothing Worsened by:  Nothing Associated symptoms: no abdominal pain, no chest pain, no cough, no diarrhea, no frequency, no headaches and no seizures        Prior to Admission medications   Medication Sig Start Date End Date Taking? Authorizing Provider  acetaminophen  (TYLENOL ) 500 MG tablet Take 500 mg by mouth every 6 (six) hours as needed for mild pain (pain score 1-3).   Yes [provider]  aspirin  EC 81 MG tablet Take 81 mg by mouth daily with breakfast. 07/15/18  Yes Rehman, Claudis PENNER, MD  cephALEXin  (KEFLEX ) 250 MG capsule Take 750 mg by mouth 2 (two) times daily.   Yes [provider]  gabapentin  (NEURONTIN ) 300 MG capsule Take 1 capsule (300 mg total) by mouth at bedtime. Patient taking differently: Take 300 mg by mouth 2 (two) times daily. 02/04/24  Yes [name] [name], MD  oxyCODONE  (ROXICODONE ) 5 MG immediate release tablet Take 1 tablet (5 mg total) by mouth every 6 (six) hours as needed. Patient taking differently: Take 5 mg by mouth every 6 (six) hours as needed for moderate pain (pain score 4-6). 06/03/24 06/03/25 Yes Agarwala, Anshul, MD    Allergies: Patient has no known allergies.    Review of Systems  Constitutional:  Negative for appetite change and fatigue.  HENT:  Negative for congestion, ear discharge and sinus pressure.   Eyes:  Negative for discharge.  Respiratory:  Negative for cough.    Cardiovascular:  Negative for chest pain.  Gastrointestinal:  Negative for abdominal pain and diarrhea.  Genitourinary:  Negative for frequency and hematuria.  Musculoskeletal:  Negative for back pain.  Skin:  Negative for rash.  Neurological:  Positive for weakness. Negative for seizures and headaches.  Psychiatric/Behavioral:  Negative for hallucinations.     Updated Vital Signs BP (!) 171/85   Pulse 87   Temp 97.8 F (36.6 C) (Oral)   Resp 13   Ht 5' 11 (1.803 m)   Wt 94.3 kg   SpO2 100%   BMI 29.00 kg/m   Physical Exam Vitals and nursing note reviewed.  Constitutional:      Appearance: He is well-developed.  HENT:     Head: Normocephalic.     Nose: Nose normal.  Eyes:     General: No scleral icterus.    Conjunctiva/sclera: Conjunctivae normal.  Neck:     Thyroid : No thyromegaly.  Cardiovascular:     Rate and Rhythm: Bradycardia present. Rhythm irregular.     Heart sounds: No murmur heard.    No friction rub. No gallop.  Pulmonary:     Breath sounds: No stridor. No wheezing or rales.  Chest:     Chest wall: No tenderness.  Abdominal:     General: There is no distension.     Tenderness: There is no abdominal tenderness. There is no rebound.  Musculoskeletal:  General: Normal range of motion.     Cervical back: Neck supple.  Lymphadenopathy:     Cervical: No cervical adenopathy.  Skin:    Findings: No erythema or rash.  Neurological:     Mental Status: He is alert and oriented to person, place, and time.     Motor: No abnormal muscle tone.     Coordination: Coordination normal.  Psychiatric:        Behavior: Behavior normal.     (all labs ordered are listed, but only abnormal results are displayed) Labs Reviewed  CBC WITH DIFFERENTIAL/PLATELET - Abnormal; Notable for the following components:      Result Value   RBC 2.95 (*)    Hemoglobin 9.7 (*)    HCT 30.1 (*)    MCV 102.0 (*)    All other components within normal limits  BASIC  METABOLIC PANEL WITH GFR - Abnormal; Notable for the following components:   Sodium 133 (*)    Potassium >7.5 (*)    Chloride 92 (*)    CO2 21 (*)    BUN 77 (*)    Creatinine, Ser 17.54 (*)    GFR, Estimated 3 (*)    Anion gap 20 (*)    All other components within normal limits  BRAIN NATRIURETIC PEPTIDE - Abnormal; Notable for the following components:   B Natriuretic Peptide 1,079.0 (*)    All other components within normal limits  MAGNESIUM - Abnormal; Notable for the following components:   Magnesium 5.2 (*)    All other components within normal limits  RENAL FUNCTION PANEL - Abnormal; Notable for the following components:   Sodium 133 (*)    Potassium >7.5 (*)    Chloride 91 (*)    CO2 20 (*)    BUN 77 (*)    Creatinine, Ser 17.79 (*)    Phosphorus 7.1 (*)    GFR, Estimated 3 (*)    Anion gap 22 (*)    All other components within normal limits  GLUCOSE, CAPILLARY - Abnormal; Notable for the following components:   Glucose-Capillary 127 (*)    All other components within normal limits  GLUCOSE, CAPILLARY - Abnormal; Notable for the following components:   Glucose-Capillary 63 (*)    All other components within normal limits  CBG MONITORING, ED - Abnormal; Notable for the following components:   Glucose-Capillary 42 (*)    All other components within normal limits  TROPONIN I (HIGH SENSITIVITY) - Abnormal; Notable for the following components:   Troponin I (High Sensitivity) 58 (*)    All other components within normal limits  TROPONIN I (HIGH SENSITIVITY) - Abnormal; Notable for the following components:   Troponin I (High Sensitivity) 55 (*)    All other components within normal limits  MRSA NEXT GEN BY PCR, NASAL  HEPATITIS B SURFACE ANTIBODY, QUANTITATIVE  HEPATITIS B SURFACE ANTIGEN  HIV ANTIBODY (ROUTINE TESTING W REFLEX)  COMPREHENSIVE METABOLIC PANEL WITH GFR  CBC  I-STAT CHEM 8, ED  CBG MONITORING, ED    EKG: None  Radiology: DG Chest Port 1  View Result Date: 06/07/2024 CLINICAL DATA:  sob EXAM: PORTABLE CHEST - 1 VIEW COMPARISON:  May 31, 2023 FINDINGS: Low lung volumes. Mild central pulmonary vascular congestion. No focal airspace consolidation, pleural effusion, or pneumothorax. Mild cardiomegaly. No acute fracture or destructive lesions. Multilevel thoracic osteophytosis. IMPRESSION: Cardiomegaly with mild central pulmonary vascular congestion. No overt pulmonary edema or pneumonia. Electronically Signed   By: Rogelia Carlean HERO.D.  On: 06/07/2024 14:55     Procedures   Medications Ordered in the ED  Chlorhexidine  Gluconate Cloth 2 % PADS 6 each (6 each Topical Given 06/07/24 1538)  aspirin  EC tablet 81 mg (has no administration in time range)  oxyCODONE  (Oxy IR/ROXICODONE ) immediate release tablet 5 mg (has no administration in time range)  Tenapanor  HCl (CKD) TABS 30 mg (30 mg Oral Not Given 06/07/24 1539)  pantoprazole  (PROTONIX ) EC tablet 40 mg (40 mg Oral Given 06/07/24 1535)  sevelamer  carbonate (RENVELA ) tablet 800 mg (has no administration in time range)  gabapentin  (NEURONTIN ) capsule 300 mg (has no administration in time range)  cholecalciferol  (VITAMIN D3) 25 MCG (1000 UNIT) tablet 5,000 Units (has no administration in time range)  sodium chloride  flush (NS) 0.9 % injection 3 mL (0 mLs Intravenous Duplicate 06/07/24 1539)  sodium chloride  flush (NS) 0.9 % injection 3 mL (3 mLs Intravenous Given by Other 06/07/24 1537)  sodium chloride  flush (NS) 0.9 % injection 3 mL (has no administration in time range)  0.9 %  sodium chloride  infusion (has no administration in time range)  acetaminophen  (TYLENOL ) tablet 650 mg (has no administration in time range)    Or  acetaminophen  (TYLENOL ) suppository 650 mg (has no administration in time range)  traZODone  (DESYREL ) tablet 50 mg (has no administration in time range)  polyethylene glycol (MIRALAX  / GLYCOLAX ) packet 17 g (has no administration in time range)  bisacodyl  (DULCOLAX)  suppository 10 mg (has no administration in time range)  ondansetron  (ZOFRAN ) tablet 4 mg (has no administration in time range)    Or  ondansetron  (ZOFRAN ) injection 4 mg (has no administration in time range)  heparin  injection 5,000 Units (has no administration in time range)  albuterol  (PROVENTIL ) (2.5 MG/3ML) 0.083% nebulizer solution 2.5 mg (has no administration in time range)  hydrALAZINE  (APRESOLINE ) injection 10 mg (has no administration in time range)  LORazepam  (ATIVAN ) tablet 1-4 mg (has no administration in time range)    Or  LORazepam  (ATIVAN ) injection 1-4 mg (has no administration in time range)  thiamine  (VITAMIN B1) tablet 100 mg (100 mg Oral Given 06/07/24 1618)    Or  thiamine  (VITAMIN B1) injection 100 mg ( Intravenous See Alternative 06/07/24 1618)  folic acid  (FOLVITE ) tablet 1 mg (1 mg Oral Given 06/07/24 1617)  multivitamin with minerals tablet 1 tablet (1 tablet Oral Given 06/07/24 1617)  diazepam  (VALIUM ) tablet 2 mg (2 mg Oral Given 06/07/24 1618)  nicotine  (NICODERM CQ  - dosed in mg/24 hours) patch 14 mg (14 mg Transdermal Patch Applied 06/07/24 1711)  atropine  injection 1 mg (1 mg Intravenous Given 06/07/24 1339)  sodium zirconium cyclosilicate  (LOKELMA ) packet 10 g (10 g Oral Given 06/07/24 1404)  calcium  gluconate inj 10% (1 g) URGENT USE ONLY! (1 g Intravenous Given 06/07/24 1405)  sodium bicarbonate  injection 50 mEq (50 mEq Intravenous Given 06/07/24 1403)  insulin  aspart (novoLOG ) injection 10 Units (10 Units Intravenous Given 06/07/24 1403)    And  dextrose  50 % solution 50 mL (50 mLs Intravenous Given 06/07/24 1403)  albuterol  (PROVENTIL ) (2.5 MG/3ML) 0.083% nebulizer solution 10 mg (10 mg Nebulization Given 06/07/24 1417)  atropine  1 MG/10ML injection 0.5 mg (0.5 mg Intravenous Given 06/07/24 1435)  calcium  gluconate inj 10% (1 g) URGENT USE ONLY! (1 g Intravenous Given 06/07/24 1455)  sodium bicarbonate  injection 50 mEq (50 mEq Intravenous Given 06/07/24 1510)  dextrose  50 %  solution (50 mLs  Given 06/07/24 1524)  dextrose  50 % solution 50 mL (0 mLs  Intravenous Duplicate 06/07/24 1535)  dextrose  50 % solution 50 mL (50 mLs Intravenous Given 06/07/24 1710)   CRITICAL CARE Performed by: Fairy Sermon Total critical care time: 76 minutes Critical care time was exclusive of separately billable procedures and treating other patients. Critical care was necessary to treat or prevent imminent or life-threatening deterioration. Critical care was time spent personally by me on the following activities: development of treatment plan with patient and/or surrogate as well as nursing, discussions with consultants, evaluation of patient's response to treatment, examination of patient, obtaining history from patient or surrogate, ordering and performing treatments and interventions, ordering and review of laboratory studies, ordering and review of radiographic studies, pulse oximetry and re-evaluation of patient's condition.\                                  Medical Decision Making Amount and/or Complexity of Data Reviewed Labs: ordered. Radiology: ordered. ECG/medicine tests: ordered.  Risk OTC drugs. Prescription drug management. Decision regarding hospitalization.   Patient with severe hyperkalemia and congestive heart failure from renal failure.  He has been treated with calcium  x 2, bicarb x 2, albuterol , Lokelma , and will get dialysis.  Patient also had including glucose.  He is admitted to medicine with nephrology following     Final diagnoses:  Hyperkalemia    ED Discharge Orders     None          Sermon Fairy, MD 06/07/24 1731

## 2024-06-07 NOTE — Progress Notes (Signed)
 Pt presented to ED w/ gen'd weakness. Missed last 2 HD sessions this week. Looks like he goes to [facility] and is followed there by Dr Rachele w/ [facility]. Initial HR was 89 then dropped to 35. Istat K+ came back > 7 and he was given 1 amp of atropine  and also ^K+ temporizing measures at around 2 pm including IV ins/ glucose, IV Ca, IV bicarb, albuterol  nebs 10mg  and po lokelma . Repeat EKG 1:45 pm looked better. Bmet K+ came back > 7.5 (drawn at 1:40 pm). Called to see if he could be dialyzed. I spoke w/ our HD RN and placed orders. He will be dialyzed shortly, the current pt's session is finished and needle sticks are being held. Had another drop in HR then and was given 1/2 amp of atropine  w/ good response. Recommend another 1 amp of bicarb as well. Have d/w pmd and ED MD.   Myer Fret  MD  [facility] 06/07/2024, 3:09 PM  Recent Labs  Lab 06/03/24 1127 06/07/24 1340  HGB 10.9* 9.7*  CALCIUM   --  8.9  CREATININE 11.70* 17.54*  K 5.5* >7.5*    Inpatient medications:  [START ON 06/08/2024] aspirin  EC  81 mg Oral Q breakfast   [START ON 06/08/2024] Chlorhexidine  Gluconate Cloth  6 each Topical Q0600   Cholecalciferol   5,000 Units Oral Daily   gabapentin   300 mg Oral QHS   heparin   5,000 Units Subcutaneous Q8H   pantoprazole   40 mg Oral Daily   senna  2 tablet Oral QHS   sevelamer  carbonate  800 mg Oral TID WC   sodium bicarbonate   50 mEq Intravenous Once   sodium chloride  flush  3 mL Intravenous Q12H   sodium chloride  flush  3 mL Intravenous Q12H   Tenapanor  HCl (CKD)  30 mg Oral Daily    sodium chloride      sodium chloride , acetaminophen  **OR** acetaminophen , albuterol , bisacodyl , hydrALAZINE , ondansetron  **OR** ondansetron  (ZOFRAN ) IV, oxyCODONE , polyethylene glycol, sodium chloride  flush, traZODone 

## 2024-06-07 NOTE — Plan of Care (Signed)

## 2024-06-08 DIAGNOSIS — E875 Hyperkalemia: Secondary | ICD-10-CM | POA: Diagnosis not present

## 2024-06-08 LAB — COMPREHENSIVE METABOLIC PANEL WITH GFR
ALT: 20 U/L (ref 0–44)
AST: 67 U/L — ABNORMAL HIGH (ref 15–41)
Albumin: 3.3 g/dL — ABNORMAL LOW (ref 3.5–5.0)
Alkaline Phosphatase: 58 U/L (ref 38–126)
Anion gap: 13 (ref 5–15)
BUN: 38 mg/dL — ABNORMAL HIGH (ref 8–23)
CO2: 28 mmol/L (ref 22–32)
Calcium: 8.8 mg/dL — ABNORMAL LOW (ref 8.9–10.3)
Chloride: 91 mmol/L — ABNORMAL LOW (ref 98–111)
Creatinine, Ser: 10.12 mg/dL — ABNORMAL HIGH (ref 0.61–1.24)
GFR, Estimated: 5 mL/min — ABNORMAL LOW (ref >60)
Glucose, Bld: 133 mg/dL — ABNORMAL HIGH (ref 70–99)
Potassium: 5.4 mmol/L — ABNORMAL HIGH (ref 3.5–5.1)
Sodium: 132 mmol/L — ABNORMAL LOW (ref 135–145)
Total Bilirubin: 0.9 mg/dL (ref 0.0–1.2)
Total Protein: 6.9 g/dL (ref 6.5–8.1)

## 2024-06-08 LAB — AEROBIC/ANAEROBIC CULTURE W GRAM STAIN (SURGICAL/DEEP WOUND)
Culture: NO GROWTH
Gram Stain: NONE SEEN

## 2024-06-08 LAB — CBC
HCT: 29.3 % — ABNORMAL LOW (ref 39.0–52.0)
Hemoglobin: 9.9 g/dL — ABNORMAL LOW (ref 13.0–17.0)
MCH: 33.9 pg (ref 26.0–34.0)
MCHC: 33.8 g/dL (ref 30.0–36.0)
MCV: 100.3 fL — ABNORMAL HIGH (ref 80.0–100.0)
Platelets: 175 K/uL (ref 150–400)
RBC: 2.92 MIL/uL — ABNORMAL LOW (ref 4.22–5.81)
RDW: 14.7 % (ref 11.5–15.5)
WBC: 7.5 K/uL (ref 4.0–10.5)
nRBC: 0 % (ref 0.0–0.2)

## 2024-06-08 LAB — GLUCOSE, CAPILLARY
Glucose-Capillary: 101 mg/dL — ABNORMAL HIGH (ref 70–99)
Glucose-Capillary: 109 mg/dL — ABNORMAL HIGH (ref 70–99)
Glucose-Capillary: 127 mg/dL — ABNORMAL HIGH (ref 70–99)
Glucose-Capillary: 132 mg/dL — ABNORMAL HIGH (ref 70–99)
Glucose-Capillary: 160 mg/dL — ABNORMAL HIGH (ref 70–99)
Glucose-Capillary: 167 mg/dL — ABNORMAL HIGH (ref 70–99)
Glucose-Capillary: 99 mg/dL (ref 70–99)

## 2024-06-08 MED ORDER — SODIUM ZIRCONIUM CYCLOSILICATE 10 G PO PACK
10.0000 g | PACK | Freq: Three times a day (TID) | ORAL | Status: AC
  Administered 2024-06-08 (×3): 10 g via ORAL
  Filled 2024-06-08 (×3): qty 1

## 2024-06-08 NOTE — Progress Notes (Signed)
 PROGRESS NOTE  Charles [name], is a 68 y.o. male, DOB - 1956-08-01, FMW:[id number]  Admit date - 06/07/2024   Admitting Physician Taniqua Issa Pearlean, MD  Outpatient Primary MD for the patient is [name] [name], MD  LOS - 1  Chief Complaint  Patient presents with   Weakness   Bradycardia     Brief Narrative:  68 y.o. male with medical history significant ESRD with TTS schedule (Last HD 05/31/24), chronic anemia of ESRD, history of hep B and hep C, HTN, depression/schizophrenia and ongoing tobacco abuse admitted on 06/07/2024 with symptomatic bradycardia in the setting of severe hypokalemia due to missed hemodialysis sessions    -Assessment and Plan:  1)Severe symptomatic bradycardia due to severe hyperkalemia in the setting of missed hemodialysis sessions - Initial EKG on admission was consistent with sinus bradycardia with LAFB with heart rate just around 30 and prominent T waves - -- Repeat EKG after hyperkalemia cocktail and repeated doses of atropine  showed sinus tachycardia with heart rate around 101 -Continue to hold Coreg  - 2)Severe Hyperkalemia--last hemodialysis session prior to admission was 05/31/2024 -initial serum potassium was greater than 7.5 with bicarb of 21 - Patient received hyperkalemia cocktail including IV dextrose , IV insulin , albuterol , calcium  chloride and bicarb and Lokelma  -Repeat potassium an hour later greater than 7.5 with bicarb of 20 -- Nephrology consult for urgent hemodialysis appreciated - We repeated temporizing measures until patient was able get hemodialysis on 06/07/2024 -Potassium is down to 5.4 -Give Lokelma  -Discussed with nephrology service plans for HD on 06/09/2024   3)ESRD--TTS schedule--- last HD 05/31/2024 - Patient missed a whole week of HD sessions because he was not feeling well - Nephrology consult for HD as above   4)Chronic Anemia of ESRD--Hgb currently 9.9 which is not far from baseline - Defer decision on ESA/Procrit to nephrology  team   5)HTN--hold Coreg  due to bradycardia as above - Okay to use IV hydralazine  as needed elevated BP   6)HFpEF--acute on chronic diastolic dysfunction CHF due to missed hemodialysis sessions - Echo from 08/18/2022 with EF of 50 to 55% with grade 1 diastolic dysfunction - On admission BNP elevated at 1079, no recent priors -Chest x-ray with pulmonary venous congestion without overt pulmonary edema-- --continue to usehemodialysis sessions to address volume status   7)Iatrogenic Hypoglycemia---patient developed hypoglycemia with blood sugar of 42 after hyperkalemia cocktail was given including insulin  - Resolved after IV dextrose  and liberalizing diet   8)Alcohol abuse--lorazepam  per [facility] protocol   9)Tobacco abuse--continue nicotine  patch as advised   10)GERD--- continue Protonix    11)Status post right hand synovial cyst removal ---had removal of synovial cyst from his right hand on 06/02/2024- -denies fevers or chills - Outpatient follow-up with  surgeon advised  Status is: Inpatient   Disposition: The patient is from: Home              Anticipated d/c is to: Home              Anticipated d/c date is: 1 day              Patient currently is not medically stable to d/c. Barriers: Not Clinically Stable-   Code Status :  -  Code Status: Full Code   Family Communication:   A (patient is alert, awake and coherent)  DVT Prophylaxis  :   - SCDs  heparin  injection 5,000 Units Start: 06/07/24 2200 SCDs Start: 06/07/24 1452 Place TED hose Start: 06/07/24 1452   Lab Results  Component Value  Date   PLT 175 06/08/2024    Inpatient Medications  Scheduled Meds:  aspirin  EC  81 mg Oral Q breakfast   Chlorhexidine  Gluconate Cloth  6 each Topical Q0600   cholecalciferol   5,000 Units Oral Daily   diazepam   2 mg Oral TID   folic acid   1 mg Oral Daily   gabapentin   300 mg Oral QHS   heparin   5,000 Units Subcutaneous Q8H   multivitamin with minerals  1 tablet Oral Daily   nicotine    14 mg Transdermal Daily   pantoprazole   40 mg Oral Daily   sevelamer  carbonate  800 mg Oral TID WC   sodium chloride  flush  3 mL Intravenous Q12H   sodium chloride  flush  3 mL Intravenous Q12H   sodium zirconium cyclosilicate   10 g Oral TID   Tenapanor  HCl (CKD)  30 mg Oral Daily   thiamine   100 mg Oral Daily   Or   thiamine   100 mg Intravenous Daily   Continuous Infusions: PRN Meds:.acetaminophen  **OR** acetaminophen , albuterol , bisacodyl , hydrALAZINE , LORazepam  **OR** LORazepam , ondansetron  **OR** ondansetron  (ZOFRAN ) IV, oxyCODONE , polyethylene glycol, sodium chloride  flush, traZODone    Anti-infectives (From admission, onward)    None         Subjective: Charles [name] today has no fevers, no emesis,  No chest pain,   - No new concerns -Eating and drinking well   Objective: Vitals:   06/08/24 1400 06/08/24 1500 06/08/24 1600 06/08/24 1636  BP: 129/72 (!) 150/82 (!) 155/55   Pulse: 84     Resp: 16 16 16    Temp:    98.2 F (36.8 C)  TempSrc:    Oral  SpO2: 100%  91%   Weight:      Height:        Intake/Output Summary (Last 24 hours) at 06/08/2024 1729 Last data filed at 06/08/2024 1100 Gross per 24 hour  Intake 423 ml  Output 3600 ml  Net -3177 ml   Filed Weights   06/07/24 1331 06/07/24 1536  Weight: 89 kg 94.3 kg    Physical Exam  Gen:- Awake Alert, in no acute distress HEENT:- [place].AT, No sclera icterus Neck-Supple Neck,No JVD,.  Lungs-improving air movement, no wheezing CV- S1, S2 normal, irregular but not regularly regular Abd-  +ve B.Sounds, Abd Soft, No tenderness,    Extremity/Skin:- No  edema, pedal pulses present  Psych-affect is appropriate, oriented x3 Neuro-generalized weakness, no new focal deficits, no tremors -MSK-left AV fistula with thrill and bruit - Right hand with dressing and splint in situ after hand surgery on 06/02/2024  Data Reviewed: I have personally reviewed following labs and imaging studies  CBC: Recent Labs  Lab  06/03/24 1127 06/07/24 1340 06/08/24 0341  WBC  --  9.3 7.5  NEUTROABS  --  7.2  --   HGB 10.9* 9.7* 9.9*  HCT 32.0* 30.1* 29.3*  MCV  --  102.0* 100.3*  PLT  --  163 175   Basic Metabolic Panel: Recent Labs  Lab 06/03/24 1127 06/07/24 1340 06/07/24 1434 06/07/24 2005 06/08/24 0341  NA 129* 133* 133* 133* 132*  K 5.5* >7.5* >7.5* 3.7 5.4*  CL 93* 92* 91* 90* 91*  CO2  --  21* 20* 27 28  GLUCOSE 82 81 80 89 133*  BUN 43* 77* 77* 29* 38*  CREATININE 11.70* 17.54* 17.79* 7.17* 10.12*  CALCIUM   --  8.9 8.9 9.0 8.8*  MG  --   --  5.2*  --   --  PHOS  --   --  7.1* 3.8  --    GFR: Estimated Creatinine Clearance: 8.4 mL/min (A) (by C-G formula based on SCr of 10.12 mg/dL (H)). Liver Function Tests: Recent Labs  Lab 06/07/24 1434 06/07/24 2005 06/08/24 0341  AST  --   --  67*  ALT  --   --  20  ALKPHOS  --   --  58  BILITOT  --   --  0.9  PROT  --   --  6.9  ALBUMIN  3.6 3.8 3.3*   Recent Results (from the past 240 hours)  Aerobic/Anaerobic Culture w Gram Stain (surgical/deep wound)     Status: None   Collection Time: 06/03/24 12:35 PM   Specimen: Path Tissue  Result Value Ref Range Status   Specimen Description WOUND RIGHT HAND  Final   Special Requests PT ON ANCEF   Final   Gram Stain NO WBC SEEN NO ORGANISMS SEEN   Final   Culture   Final    No growth aerobically or anaerobically. Performed at [facility], [address], [place], [place] [address]    Report Status 06/08/2024 FINAL  Final  MRSA Next Gen by PCR, Nasal     Status: None   Collection Time: 06/07/24  2:53 PM   Specimen: Nasal Mucosa; Nasal Swab  Result Value Ref Range Status   MRSA by PCR Next Gen NOT DETECTED NOT DETECTED Final    Comment: (NOTE) The GeneXpert MRSA Assay (FDA approved for NASAL specimens only), is one component of a comprehensive MRSA colonization surveillance program. It is not intended to diagnose MRSA infection nor to guide or monitor treatment for MRSA  infections. Test performance is not FDA approved in patients less than 28 years old. Performed at [facility], [address] [place], [place] [address]     Radiology Studies: [facility] Center 1 View Result Date: 06/07/2024 CLINICAL DATA:  sob EXAM: PORTABLE CHEST - 1 VIEW COMPARISON:  May 31, 2023 FINDINGS: Low lung volumes. Mild central pulmonary vascular congestion. No focal airspace consolidation, pleural effusion, or pneumothorax. Mild cardiomegaly. No acute fracture or destructive lesions. Multilevel thoracic osteophytosis. IMPRESSION: Cardiomegaly with mild central pulmonary vascular congestion. No overt pulmonary edema or pneumonia. Electronically Signed   By: Rogelia Myers M.D.   On: 06/07/2024 14:55   Scheduled Meds:  aspirin  EC  81 mg Oral Q breakfast   Chlorhexidine  Gluconate Cloth  6 each Topical Q0600   cholecalciferol   5,000 Units Oral Daily   diazepam   2 mg Oral TID   folic acid   1 mg Oral Daily   gabapentin   300 mg Oral QHS   heparin   5,000 Units Subcutaneous Q8H   multivitamin with minerals  1 tablet Oral Daily   nicotine   14 mg Transdermal Daily   pantoprazole   40 mg Oral Daily   sevelamer  carbonate  800 mg Oral TID WC   sodium chloride  flush  3 mL Intravenous Q12H   sodium chloride  flush  3 mL Intravenous Q12H   sodium zirconium cyclosilicate   10 g Oral TID   Tenapanor  HCl (CKD)  30 mg Oral Daily   thiamine   100 mg Oral Daily   Or   thiamine   100 mg Intravenous Daily   Continuous Infusions:   LOS: 1 day   Rendall Carwin M.D on 06/08/2024 at 5:29 PM  Go to [email or web address] - for contact info  [facility] - Office  [phone]  If 7PM-7AM, please contact night-coverage [email or web address]  06/08/2024, 5:29 PM

## 2024-06-08 NOTE — Plan of Care (Signed)
  Problem: Education: Goal: Knowledge of General Education information will improve Description: Including pain rating scale, medication(s)/side effects and non-pharmacologic comfort measures Outcome: Progressing   Problem: Coping: Goal: Level of anxiety will decrease Outcome: Progressing   Problem: Skin Integrity: Goal: Risk for impaired skin integrity will decrease Outcome: Progressing   
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
## 2024-06-08 NOTE — Plan of Care (Signed)

## 2024-06-09 ENCOUNTER — Telehealth: Payer: Self-pay | Admitting: Internal Medicine

## 2024-06-09 ENCOUNTER — Other Ambulatory Visit (HOSPITAL_COMMUNITY): Payer: Self-pay

## 2024-06-09 DIAGNOSIS — E875 Hyperkalemia: Secondary | ICD-10-CM | POA: Diagnosis not present

## 2024-06-09 LAB — RENAL FUNCTION PANEL
Albumin: 3 g/dL — ABNORMAL LOW (ref 3.5–5.0)
Anion gap: 15 (ref 5–15)
BUN: 48 mg/dL — ABNORMAL HIGH (ref 8–23)
CO2: 26 mmol/L (ref 22–32)
Calcium: 9 mg/dL (ref 8.9–10.3)
Chloride: 97 mmol/L — ABNORMAL LOW (ref 98–111)
Creatinine, Ser: 12.45 mg/dL — ABNORMAL HIGH (ref 0.61–1.24)
GFR, Estimated: 4 mL/min — ABNORMAL LOW (ref >60)
Glucose, Bld: 129 mg/dL — ABNORMAL HIGH (ref 70–99)
Phosphorus: 6.5 mg/dL — ABNORMAL HIGH (ref 2.5–4.6)
Potassium: 4.6 mmol/L (ref 3.5–5.1)
Sodium: 138 mmol/L (ref 135–145)

## 2024-06-09 LAB — GLUCOSE, CAPILLARY
Glucose-Capillary: 165 mg/dL — ABNORMAL HIGH (ref 70–99)
Glucose-Capillary: 105 mg/dL — ABNORMAL HIGH (ref 70–99)

## 2024-06-09 MED ORDER — CHOLECALCIFEROL 25 MCG (1000 UT) PO CAPS: 5000.0000 [IU] | ORAL_CAPSULE | Freq: Every day | ORAL | 6 refills | Status: AC

## 2024-06-09 MED ORDER — FOLIC ACID 1 MG PO TABS: 1.0000 mg | ORAL_TABLET | Freq: Every day | ORAL | 5 refills | Status: DC

## 2024-06-09 MED ORDER — SEVELAMER CARBONATE 800 MG PO TABS: 800.0000 mg | ORAL_TABLET | Freq: Three times a day (TID) | ORAL | 5 refills | Status: AC

## 2024-06-09 MED ORDER — NICOTINE 14 MG/24HR TD PT24: 14.0000 mg | MEDICATED_PATCH | Freq: Every day | TRANSDERMAL | 0 refills | Status: DC

## 2024-06-09 MED ORDER — ASPIRIN EC 81 MG PO TBEC: 81.0000 mg | DELAYED_RELEASE_TABLET | Freq: Every day | ORAL | 3 refills | Status: DC

## 2024-06-09 MED ORDER — OMEPRAZOLE 20 MG PO CPDR: 20.0000 mg | DELAYED_RELEASE_CAPSULE | Freq: Every day | ORAL | 5 refills | Status: DC

## 2024-06-09 MED ORDER — XPHOZAH 30 MG PO TABS: 30.0000 mg | ORAL_TABLET | Freq: Every day | ORAL | 1 refills | Status: DC

## 2024-06-09 NOTE — Discharge Instructions (Addendum)
 1)Very Low-salt diet advised---Less than 2 gm of Sodium per day advised----ok to use Mrs DASH salt substitute instead of Salt 2)Weigh yourself daily, call if you gain more than 3 pounds in 1 day or more than 5 pounds in 1 week as your Hemodialysis schedule may need to be adjusted 3)Limit your Fluid  intake to no more than 60 ounces (1.8 Liters) per day 4)Continue hemodialysis on Tuesdays Thursdays and Saturdays--- 5) follow-up with the orthopedic/hand surgeon as advised for recheck-right hand wound  Crisis Mobile: [facility] Alternatives: [phone] (for crisis response 24 hours a day) [facility] [phone] Outpatient Substance Use Treatment Services [facility]  Chemical Dependence Intensive Outpatient Program [address] [place], [place] [address]  [phone] Private insurance, Medicare A&B, and [facility]  ADS (Alcohol and Drug Services)  [address]  [place], [place] [address] [phone] Medicaid, Self Pay  [facility]  [address] [place]  [place], [place] [phone] Medicaid and [facility], Self Pay  [facility] [address]  [place], [place]  [phone] [facility], and Self Pay [facility]  [address]  [place], [place] [address]  [phone] or [phone] Private Insurance Only  [facility] [address]  [place], [place]  [phone] Medicaid, Medicare, Private Insurance [facility] Counseling Services at  the [facility] [address]  [place], [place] [address] [phone] Services are free or reduced [facility]  [address] [phone]  Self Pay only, sliding scale [facility]  [address]  [place], [place] [address] [phone] ([facility]) Self Pay, Medicaid Only  [facility] [address], [place]  [address] [phone] Medicaid, Medicare, Private Insurance Crisis Mobile: [facility]rapeutic Alternatives: [phone] (for crisis response 24 hours a day) [facility] [phone] Adolescent Substance Use Treatment Services [facility] [address]  [place], [place]  [phone] Self Pay Offer scholarships from the [facility] to help pay for treatment  Website: http://www.lambert.com/ [facility] Adolescent Substance use Program Males ages: 12-17 Adolescent Substance use Program Females: 12-17 [facility] [address]  [place], [place] [address] (ph) [phone]  (fax) [phone] [facility]  [address]  [place], [place]  [address] (ph) [phone]  (fax) [phone] [facility] [address]  [place],  [place] [address] (ph) [phone]  (fax) [phone] [facility] [address], [place], [place]  [address] (ph) [phone]  (fax) [phone] Website: https://youthhavenservices.com/  Health Outpatient Substance Abuse Intensive Outpatient  Program for Adolescents Phone: [phone] Address: [address],  [place], [place] Website:  [email or web address]-house.com/ ral-health/outpatient-[email or web address]-health-care/ Crisis Mobile: [facility] Alternatives: [phone] (for crisis response 24 hours a day) [facility] [phone] Residential Substance Use Treatment  Services [facility] ([facility]  [address]  [place], [place] [address]  [phone] or [phone] Detox (Medicare, Medicaid, private  insurance, and self pay)  Residential Rehab 14 days (Medicare,  Medicaid, private insurance, and self pay) RTS (Residential Treatment Services)  [address] [place], [place]  [phone]  Male and Male Detox (Self Pay and  Medicaid limited availability)   Rehab only Male (Medicaid and self pay only) [facility] [address]  [address]  [place], [place] [address]  [phone] or [phone] Detox and Residential Treatment Private Insurance Only  [facility]  [phone]  [address]  [place], [place] [address]  [phone]  Treatment Only, must make assessment  appointment, and must be sober for  assessment appointment.  Self Pay Only, Medicare A&B, [facility] [place] ID only! *Transportation assistance offered from  [place] on [employer] [address] [place], [place] [address] Walk in interviews M-Sat 8-4p No pending legal charges [phone] [facility]: [facility] Referral  [address] [place], [place] [phone] (Self Pay, [facility]) [facility] [address] [place], [place] [address] [phone] Detox and Residential Treatment Medicare and Private Insurance [facility] [address]  [place], [place] [address] 28 Day Women's Facility: [phone] 28 Day Men's Facility: [phone] Long-term Residential Program:  [phone] Males 25 and Over (No Insurance, upfront fee) [facility]  [address] [place], [place] [address] [phone] Private Insurance with [employer], Private Pay [facility] [address] [place], [place] [address] Local [phone] Private Insurance Only [facility] [address]  [place], [place] [address]  [phone] (Males, upfront fee) [facility] [address]  [place], [address] [phone] Private Insurance  Crisis Mobile: [facility] Alternatives: [phone] (for crisis response 24 hours a day) [facility] [phone] [place]  [facility] Locations [facility]  [address]  [place], [place]  [phone] [facility] Based Program for individuals  experiencing homelessness Self Pay, No insurance [facility]  Men's program: [facility] [address] [place], [place] [address] [phone] [facility] Women's program: [facility] [address] [place], [place] [address] [phone] Christian Based Program for individuals  experiencing homelessness Self Pay, No insurance [facility] [address] [place], [place] [address]  [phone] [facility] Based Program for individuals  experiencing homelessness Self Pay, No insurance [facility] [address] [place], [place] [address] [phone] [facility] Based Program for individuals  experiencing homelessness Self Pay, No insurance [facility] [address] [place], [place] [phone] Christian Based Program for males  experiencing homelessness Self Pay, No insurance

## 2024-06-09 NOTE — Progress Notes (Signed)
SATURATION QUALIFICATIONS: (This note is used to comply with regulatory documentation for home oxygen)  Patient Saturations on Room Air at Rest = 100%  Patient Saturations on Room Air while Ambulating = 98%  Patient Saturations on 0 Liters of oxygen while Ambulating = 98%  Please briefly explain why patient needs home oxygen: 

## 2024-06-09 NOTE — Telephone Encounter (Signed)
 Copied from CRM [id number]. Topic: Clinical - Prescription Issue >> Jun 09, 2024  4:29 PM Kevelyn M wrote: Reason for CRM: Patient just got out of the hospital and the prescriptions prescribed sevelamer  carbonate (RENVELA ) 800 MG tablet  Tenapanor  HCl, CKD, (XPHOZAH ) 30 MG TABS are not covered buy his insurance. He will need to be prescribed different meds that are covered by his insurance. Please advise. Will need a prior authorization.  Call back # [phone]

## 2024-06-09 NOTE — Progress Notes (Signed)
 Reviewed discharge instructions with patient. Patient stated he understood his instructions. Transportation set up for patient to go home. Transportation also set up for patient's dialysis session tomorrow. This nurse noticed patient had a follow up appointment with the Ortho office for his right arm at 8AM. Attempted to call office to reschedule appointment with no success due to office being closed for lunch. Notified patient that he will need to call office to schedule appointment to a later date since he has dialysis tomorrow. IV removed from right arm.

## 2024-06-09 NOTE — Care Management Important Message (Signed)
 Important Message  Patient Details  Name: Charles [name] MRN: [id number] Date of Birth: 1956/04/19   Important Message Given:  N/A - LOS <3 / Initial given by admissions     [name] Ada 06/09/2024, 10:35 AM

## 2024-06-09 NOTE — Plan of Care (Signed)
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
## 2024-06-09 NOTE — TOC Progression Note (Signed)
 Transition of Care [facility]) - Progression Note    Patient Details  Name: Charles [name] MRN: [id number] Date of Birth: [birth year]  Transition of Care [facility]) CM/SW Contact  [place] Saddie Kim, [place] Phone Number: 06/09/2024, 8:07 AM  Clinical Narrative:  TOC consulted for substance abuse counseling. Pt reports he drinks 2-3 beers daily. He does not feel this is a problem for him. LCSW added substance abuse treatment resources to AVS and pt is aware. No other needs reported at this time.      Expected Discharge Plan: Home/Self Care Barriers to Discharge: Continued Medical Work up               Expected Discharge Plan and Services In-house Referral: Clinical Social Work Discharge Planning Services: CM Consult                                           Social Drivers of Health (SDOH) Interventions SDOH Screenings   Food Insecurity: Patient Declined (06/07/2024)  Housing: Patient Declined (06/07/2024)  Transportation Needs: Patient Declined (06/07/2024)  Utilities: Patient Declined (06/07/2024)  Depression (PHQ2-9): Low Risk  (02/04/2024)  Social Connections: Unknown (06/07/2024)  Tobacco Use: High Risk (06/07/2024)    Readmission Risk Interventions    06/08/2024    5:24 PM  Readmission Risk Prevention Plan  Transportation Screening Complete  PCP or Specialist Appt within 5-7 Days Complete  Home Care Screening Complete  Medication Review (RN CM) Complete

## 2024-06-10 ENCOUNTER — Encounter: Admitting: Orthopedic Surgery

## 2024-06-10 ENCOUNTER — Telehealth: Payer: Self-pay | Admitting: *Deleted

## 2024-06-10 DIAGNOSIS — D689 Coagulation defect, unspecified: Secondary | ICD-10-CM | POA: Diagnosis not present

## 2024-06-10 DIAGNOSIS — N2581 Secondary hyperparathyroidism of renal origin: Secondary | ICD-10-CM | POA: Diagnosis not present

## 2024-06-10 DIAGNOSIS — N186 End stage renal disease: Secondary | ICD-10-CM | POA: Diagnosis not present

## 2024-06-10 DIAGNOSIS — E039 Hypothyroidism, unspecified: Secondary | ICD-10-CM | POA: Diagnosis not present

## 2024-06-10 DIAGNOSIS — R52 Pain, unspecified: Secondary | ICD-10-CM | POA: Diagnosis not present

## 2024-06-10 DIAGNOSIS — R110 Nausea: Secondary | ICD-10-CM | POA: Diagnosis not present

## 2024-06-10 DIAGNOSIS — E1122 Type 2 diabetes mellitus with diabetic chronic kidney disease: Secondary | ICD-10-CM | POA: Diagnosis not present

## 2024-06-10 DIAGNOSIS — R197 Diarrhea, unspecified: Secondary | ICD-10-CM | POA: Diagnosis not present

## 2024-06-10 LAB — POCT I-STAT, CHEM 8
BUN: 71 mg/dL — ABNORMAL HIGH (ref 8–23)
Calcium, Ion: 1 mmol/L — ABNORMAL LOW (ref 1.15–1.40)
Chloride: 99 mmol/L (ref 98–111)
Creatinine, Ser: >18 mg/dL — ABNORMAL HIGH (ref 0.61–1.24)
Glucose, Bld: 79 mg/dL (ref 70–99)
HCT: 27 % — ABNORMAL LOW (ref 39.0–52.0)
Hemoglobin: 9.2 g/dL — ABNORMAL LOW (ref 13.0–17.0)
Potassium: 7.2 mmol/L (ref 3.5–5.1)
Sodium: 131 mmol/L — ABNORMAL LOW (ref 135–145)
TCO2: 22 mmol/L (ref 22–32)

## 2024-06-10 LAB — HEPATITIS B SURFACE ANTIBODY, QUANTITATIVE: Hep B S AB Quant (Post): 13.8 m[IU]/mL

## 2024-06-10 NOTE — Telephone Encounter (Signed)
 Left detailed vm

## 2024-06-10 NOTE — Transitions of Care (Post Inpatient/ED Visit) (Signed)
 06/10/2024  Name: Charles [name] MRN: [id number] DOB: October 07, 1956  Today's TOC FU Call Status: Today's TOC FU Call Status:: Successful TOC FU Call Completed TOC FU Call Complete Date: 06/10/24 Patient's Name and Date of Birth confirmed.  Transition Care Management Follow-up Telephone Call Date of Discharge: 06/09/24 [facility] Type of Discharge: Inpatient Admission Primary Inpatient Discharge Diagnosis:: hyperkalemia How have you been since you were released from the hospital?: Better Any questions or concerns?: No  Items Reviewed: Did you receive and understand the discharge instructions provided?: Yes Medications obtained,verified, and reconciled?: Yes (Medications Reviewed) Any new allergies since your discharge?: No Dietary orders reviewed?: No Do you have support at home?: Yes People in Home [RPT]: alone Name of Support/Comfort Primary Source: Jerrell brother  Medications Reviewed Today: Medications Reviewed Today     Reviewed by Kennieth Cathlean DEL, RN (Case Manager) on 06/10/24 at 1142  Med List Status: <None>   Medication Order Taking? Sig Documenting Provider Last Dose Status Informant  acetaminophen  (TYLENOL ) 500 MG tablet [id number] Yes Take 500 mg by mouth every 6 (six) hours as needed for mild pain (pain score 1-3). [provider]  Active Self, Pharmacy Records  aspirin  EC 81 MG tablet [id number] Yes Take 1 tablet (81 mg total) by mouth daily with breakfast. Pearlean Manus, MD  Active   Cholecalciferol  25 MCG (1000 UT) capsule [id number] Yes Take 5 capsules (5,000 Units total) by mouth daily. Pearlean Manus, MD  Active   folic acid  (FOLVITE ) 1 MG tablet [id number] Yes Take 1 tablet (1 mg total) by mouth daily. Pearlean Manus, MD  Active   gabapentin  (NEURONTIN ) 300 MG capsule [id number] Yes Take 1 capsule (300 mg total) by mouth at bedtime.  Patient taking differently: Take 300 mg by mouth 2 (two) times daily.   [name] [name],  MD  Active Self, Pharmacy Records  nicotine  (NICODERM CQ  - DOSED IN MG/24 HOURS) 14 mg/24hr patch [id number] Yes Place 1 patch (14 mg total) onto the skin daily. Pearlean Manus, MD  Active   omeprazole  (PRILOSEC) 20 MG capsule [id number] Yes Take 1 capsule (20 mg total) by mouth daily. Pearlean Manus, MD  Active   oxyCODONE  (ROXICODONE ) 5 MG immediate release tablet [id number] Yes Take 1 tablet (5 mg total) by mouth every 6 (six) hours as needed.  Patient taking differently: Take 5 mg by mouth every 6 (six) hours as needed for moderate pain (pain score 4-6).   Arlinda Buster, MD  Active Self, Pharmacy Records           Med Note DRENA CHUCK MATSU   Sat Jun 07, 2024  2:58 PM) Pt stated he does not have this medication at home, was prescribed it recently  sevelamer  carbonate (RENVELA ) 800 MG tablet [id number]  Take 1 tablet (800 mg total) by mouth 3 (three) times daily with meals. Pearlean Manus, MD  Active   Tenapanor  HCl, CKD, (XPHOZAH ) 30 MG TABS [id number] Yes Take 30 mg by mouth daily. Pearlean Manus, MD  Active             Home Care and Equipment/Supplies: Were Home Health Services Ordered?: NA Any new equipment or medical supplies ordered?: NA  Functional Questionnaire: Do you need assistance with bathing/showering or dressing?: No Do you need assistance with meal preparation?: No Do you need assistance with eating?: No Do you have difficulty maintaining continence: No Do you need assistance with getting out of bed/getting out of a chair/moving?: No Do you have difficulty  managing or taking your medications?: No  Follow up appointments reviewed: PCP Follow-up appointment confirmed?: Yes Date of PCP follow-up appointment?: 06/20/24 Follow-up Provider: [facility] G[facility] Follow-up appointment confirmed?: No Reason Specialist Follow-Up Not Confirmed: Patient has Specialist Provider Number and will Call for Appointment (Per patient he will call and reschedule  appt) Do you need transportation to your follow-up appointment?: No Do you understand care options if your condition(s) worsen?: Yes-patient verbalized understanding  SDOH Interventions Today    Flowsheet Row Most Recent Value  SDOH Interventions   Food Insecurity Interventions Intervention Not Indicated  Housing Interventions Intervention Not Indicated  Transportation Interventions Intervention Not Indicated, Patient Resources (Friends/Family)  [Patrient uses rcat]  Utilities Interventions Intervention Not Indicated    Goals Addressed             This Visit's Progress    [facility] Transitions of Care (TOC) Care Plan       Problems:  Recent Hospitalization for treatment of ESRD and hyperkalemia Medication access barrier Medication was changes to sevelamer  carbonate (RENVELA ) that was not covered from sevelamer   Goal:  Over the next 30 days, the patient will not experience hospital readmission  Interventions:  Transitions of Care: Doctor Visits  - discussed the importance of doctor visits  Patient Self Care Activities:  Attend all scheduled provider appointments Call pharmacy for medication refills 3-7 days in advance of running out of medications Call provider office for new concerns or questions  Notify RN Care Manager of TOC call rescheduling needs Participate in Transition of Care Program/Attend TOC scheduled calls Perform all self care activities independently  Perform IADL's (shopping, preparing meals, housekeeping, managing finances) independently Take medications as prescribed   Work with the pharmacist to address medication management needs and will continue to work with the clinical team to address health care and disease management related needs Weigh yourself daily, call if you gain more than 3 pounds in 1 day or more than 5 pounds in 1 week as your Hemodialysis schedule may need to be adjusted Limit your Fluid  intake to no more than 60 ounces (1.8 Liters) per  day  Plan:  An initial telephone outreach has been scheduled for: [id number] Next PCP appointment scheduled for: [id number] Telephone follow up appointment with care management team member scheduled for:  Bing Edison [id number] [facility] team will follow up with the patient and will provide direct communication to the PCP for this patient.          The patient has been provided with contact information for the care management team and has been advised to call with any health-related questions or concerns. The patient verbalized understanding with current POC. The patient is directed to their insurance card regarding availability of benefits coverage    RN discussed limit fluid intake to 60 oz (1.8 liters) per day Daily weights and call if more that 3 pounds in 1 day or 5 pounds in 1 week as your hemodialysis needs adjusting Continue Tues, Thurs and Sat dialysis  [employer] BSN RN [facility] Care Management Coordinator [email or web address] Direct Dial : [phone]  Fax: [phone] Website: [email or web address]

## 2024-06-12 ENCOUNTER — Encounter: Admitting: Orthopedic Surgery

## 2024-06-13 ENCOUNTER — Encounter: Payer: Self-pay | Admitting: Nurse Practitioner

## 2024-06-13 ENCOUNTER — Ambulatory Visit (INDEPENDENT_AMBULATORY_CARE_PROVIDER_SITE_OTHER): Admitting: Nurse Practitioner

## 2024-06-13 VITALS — BP 110/69 | HR 108 | Ht 71.0 in | Wt 197.0 lb

## 2024-06-13 DIAGNOSIS — Z4802 Encounter for removal of sutures: Secondary | ICD-10-CM

## 2024-06-13 DIAGNOSIS — T148XXA Other injury of unspecified body region, initial encounter: Secondary | ICD-10-CM | POA: Insufficient documentation

## 2024-06-13 NOTE — Patient Instructions (Signed)
 1) Postop removal of sutures x 2 regions on right hand - used suture removal kit and steristrip application x 3

## 2024-06-13 NOTE — Progress Notes (Signed)
   Acute Office Visit  Subjective:     Patient ID: Charles [name], male    DOB: 1955-11-20, 69 y.o.   MRN: [id number]  Chief Complaint  Patient presents with   Wound Check    Needs stiches removed from left hand, post surgery.  Patient was not able to see surgeon to have stiches removed    Wound Check   Patient is in today for suture removal to right thumb pad and right inner hand near the wrist which should have been removed by his surgeon but he was unable to go to that appt due to his hemodialysis.  Patient tolerated both suture removal regions well.  Very well healed.  Three half steri strips were applied to suture line due to location and possible stress of skin.  Entire removal process lasted 18 minutes.  ROS      Objective:    BP 110/69   Pulse (!) 108   Ht 5' 11 (1.803 m)   Wt 197 lb (89.4 kg)   BMI 27.48 kg/m    Physical Exam Vitals and nursing note reviewed.  Constitutional:      Appearance: Normal appearance.  HENT:     Head: Normocephalic.  Cardiovascular:     Rate and Rhythm: Normal rate and regular rhythm.  Pulmonary:     Effort: Pulmonary effort is normal.     Breath sounds: Normal breath sounds.  Skin:    General: Skin is warm and dry.     Findings: Lesion present.  Neurological:     Mental Status: He is alert and oriented to person, place, and time.  Psychiatric:        Mood and Affect: Mood normal.        Behavior: Behavior normal.     No results found for any visits on 06/13/24.      Assessment & Plan:  1) Postop removal of sutures x 2 regions on right hand - used suture removal kit and steristrip application x 3    Problem List Items Addressed This Visit   None   No orders of the defined types were placed in this encounter.   No follow-ups on file.  Neale Carpen, NP

## 2024-06-17 DIAGNOSIS — N186 End stage renal disease: Secondary | ICD-10-CM | POA: Diagnosis not present

## 2024-06-17 DIAGNOSIS — T82868A Thrombosis of vascular prosthetic devices, implants and grafts, initial encounter: Secondary | ICD-10-CM | POA: Diagnosis not present

## 2024-06-17 DIAGNOSIS — I871 Compression of vein: Secondary | ICD-10-CM | POA: Diagnosis not present

## 2024-06-17 DIAGNOSIS — T82858A Stenosis of vascular prosthetic devices, implants and grafts, initial encounter: Secondary | ICD-10-CM | POA: Diagnosis not present

## 2024-06-18 ENCOUNTER — Telehealth: Payer: Self-pay

## 2024-06-18 ENCOUNTER — Telehealth

## 2024-06-18 ENCOUNTER — Encounter: Admitting: Orthopedic Surgery

## 2024-06-18 DIAGNOSIS — R197 Diarrhea, unspecified: Secondary | ICD-10-CM | POA: Diagnosis not present

## 2024-06-18 DIAGNOSIS — R110 Nausea: Secondary | ICD-10-CM | POA: Diagnosis not present

## 2024-06-18 DIAGNOSIS — N2581 Secondary hyperparathyroidism of renal origin: Secondary | ICD-10-CM | POA: Diagnosis not present

## 2024-06-18 DIAGNOSIS — R52 Pain, unspecified: Secondary | ICD-10-CM | POA: Diagnosis not present

## 2024-06-18 DIAGNOSIS — N186 End stage renal disease: Secondary | ICD-10-CM | POA: Diagnosis not present

## 2024-06-18 DIAGNOSIS — D689 Coagulation defect, unspecified: Secondary | ICD-10-CM | POA: Diagnosis not present
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
## 2024-06-19 ENCOUNTER — Encounter: Admitting: Orthopedic Surgery

## 2024-06-19 ENCOUNTER — Telehealth: Payer: Self-pay

## 2024-06-19 DIAGNOSIS — E1122 Type 2 diabetes mellitus with diabetic chronic kidney disease: Secondary | ICD-10-CM | POA: Diagnosis not present

## 2024-06-19 DIAGNOSIS — N186 End stage renal disease: Secondary | ICD-10-CM | POA: Diagnosis not present

## 2024-06-19 DIAGNOSIS — Z992 Dependence on renal dialysis: Secondary | ICD-10-CM | POA: Diagnosis not present

## 2024-06-19 DIAGNOSIS — E039 Hypothyroidism, unspecified: Secondary | ICD-10-CM | POA: Diagnosis not present

## 2024-06-19 DIAGNOSIS — R52 Pain, unspecified: Secondary | ICD-10-CM | POA: Diagnosis not present

## 2024-06-19 DIAGNOSIS — R197 Diarrhea, unspecified: Secondary | ICD-10-CM | POA: Diagnosis not present

## 2024-06-19 DIAGNOSIS — D689 Coagulation defect, unspecified: Secondary | ICD-10-CM | POA: Diagnosis not present

## 2024-06-19 DIAGNOSIS — N2581 Secondary hyperparathyroidism of renal origin: Secondary | ICD-10-CM | POA: Diagnosis not present

## 2024-06-19 NOTE — Telephone Encounter (Signed)
 Called pt to reschedule his appointment. LMOM If pt calls back please either schedule him as soon as possible as this would be a post op visit or call [place] or [facility].

## 2024-06-20 ENCOUNTER — Inpatient Hospital Stay: Admitting: Family Medicine

## 2024-06-21 ENCOUNTER — Emergency Department (HOSPITAL_COMMUNITY)

## 2024-06-21 ENCOUNTER — Encounter (HOSPITAL_COMMUNITY): Payer: Self-pay

## 2024-06-21 ENCOUNTER — Emergency Department (HOSPITAL_COMMUNITY): Admission: EM | Admit: 2024-06-21 | Discharge: 2024-06-21 | Disposition: A | Discharge status: Home / Self Care

## 2024-06-21 ENCOUNTER — Other Ambulatory Visit: Payer: Self-pay

## 2024-06-21 DIAGNOSIS — R531 Weakness: Secondary | ICD-10-CM | POA: Diagnosis not present

## 2024-06-21 DIAGNOSIS — Z7982 Long term (current) use of aspirin: Secondary | ICD-10-CM | POA: Insufficient documentation

## 2024-06-21 DIAGNOSIS — R930 Abnormal findings on diagnostic imaging of skull and head, not elsewhere classified: Secondary | ICD-10-CM | POA: Insufficient documentation

## 2024-06-21 DIAGNOSIS — R42 Dizziness and giddiness: Secondary | ICD-10-CM | POA: Diagnosis not present

## 2024-06-21 DIAGNOSIS — R55 Syncope and collapse: Secondary | ICD-10-CM | POA: Diagnosis not present

## 2024-06-21 DIAGNOSIS — R944 Abnormal results of kidney function studies: Secondary | ICD-10-CM | POA: Insufficient documentation

## 2024-06-21 DIAGNOSIS — R404 Transient alteration of awareness: Secondary | ICD-10-CM | POA: Diagnosis not present

## 2024-06-21 DIAGNOSIS — I6782 Cerebral ischemia: Secondary | ICD-10-CM | POA: Diagnosis not present

## 2024-06-21 DIAGNOSIS — I63512 Cerebral infarction due to unspecified occlusion or stenosis of left middle cerebral artery: Secondary | ICD-10-CM | POA: Diagnosis not present

## 2024-06-21 DIAGNOSIS — R0600 Dyspnea, unspecified: Secondary | ICD-10-CM | POA: Diagnosis not present

## 2024-06-21 LAB — COMPREHENSIVE METABOLIC PANEL WITH GFR
ALT: 9 U/L (ref 0–44)
AST: 16 U/L (ref 15–41)
Albumin: 3.9 g/dL (ref 3.5–5.0)
Alkaline Phosphatase: 52 U/L (ref 38–126)
Anion gap: 15 (ref 5–15)
BUN: 30 mg/dL — ABNORMAL HIGH (ref 8–23)
CO2: 33 mmol/L — ABNORMAL HIGH (ref 22–32)
Calcium: 9.8 mg/dL (ref 8.9–10.3)
Chloride: 88 mmol/L — ABNORMAL LOW (ref 98–111)
Creatinine, Ser: 10.39 mg/dL — ABNORMAL HIGH (ref 0.61–1.24)
GFR, Estimated: 5 mL/min — ABNORMAL LOW (ref >60)
Glucose, Bld: 107 mg/dL — ABNORMAL HIGH (ref 70–99)
Potassium: 4.9 mmol/L (ref 3.5–5.1)
Sodium: 136 mmol/L (ref 135–145)
Total Bilirubin: 0.8 mg/dL (ref 0.0–1.2)
Total Protein: 7.8 g/dL (ref 6.5–8.1)

## 2024-06-21 LAB — CBC WITH DIFFERENTIAL/PLATELET
Abs Immature Granulocytes: 0.02 K/uL (ref 0.00–0.07)
Basophils Absolute: 0 K/uL (ref 0.0–0.1)
Basophils Relative: 1 %
Eosinophils Absolute: 0 K/uL (ref 0.0–0.5)
Eosinophils Relative: 1 %
HCT: 30.9 % — ABNORMAL LOW (ref 39.0–52.0)
Hemoglobin: 9.9 g/dL — ABNORMAL LOW (ref 13.0–17.0)
Immature Granulocytes: 0 %
Lymphocytes Relative: 29 %
Lymphs Abs: 1.5 K/uL (ref 0.7–4.0)
MCH: 32.9 pg (ref 26.0–34.0)
MCHC: 32 g/dL (ref 30.0–36.0)
MCV: 102.7 fL — ABNORMAL HIGH (ref 80.0–100.0)
Monocytes Absolute: 0.4 K/uL (ref 0.1–1.0)
Monocytes Relative: 7 %
Neutro Abs: 3.3 K/uL (ref 1.7–7.7)
Neutrophils Relative %: 62 %
Platelets: 256 K/uL (ref 150–400)
RBC: 3.01 MIL/uL — ABNORMAL LOW (ref 4.22–5.81)
RDW: 14.4 % (ref 11.5–15.5)
WBC: 5.2 K/uL (ref 4.0–10.5)
nRBC: 0 % (ref 0.0–0.2)

## 2024-06-21 LAB — TROPONIN I (HIGH SENSITIVITY)
Troponin I (High Sensitivity): 20 ng/L — ABNORMAL HIGH (ref <18)
Troponin I (High Sensitivity): 21 ng/L — ABNORMAL HIGH (ref <18)

## 2024-06-21 LAB — PHOSPHORUS: Phosphorus: 4.8 mg/dL — ABNORMAL HIGH (ref 2.5–4.6)

## 2024-06-21 LAB — MAGNESIUM: Magnesium: 4.7 mg/dL — ABNORMAL HIGH (ref 1.7–2.4)

## 2024-06-21 MED ORDER — MECLIZINE HCL 12.5 MG PO TABS
25.0000 mg | ORAL_TABLET | Freq: Once | ORAL | Status: AC
  Administered 2024-06-21: 25 mg via ORAL
  Filled 2024-06-21: qty 2

## 2024-06-21 NOTE — ED Triage Notes (Signed)
 Pt BIB ems for dizziness. Pt called after getting on his scooter to go to dialysis today and became dizzy. Pt drove back home and called EMS. Per EMS orthostatic while at the house initial BP sitting 116/69 standing 106/69. Pt states just feels weak and dizzy today.

## 2024-06-21 NOTE — ED Provider Notes (Signed)
 [place] EMERGENCY DEPARTMENT AT [facility] Provider Note   CSN: [id number] Arrival date & time: 06/21/24  1145     Patient presents with: Dizziness   Charles [name] is a 68 y.o. male.   Patient is a [age] year old male who presents emergency department chief complaint of dizziness which began this morning at approximately 0600.  Patient notes that he has had previous similar to this previously.  He notes he has had no recent falls or blunt head trauma.  He notes he did attempt to go to dialysis today but felt too dizzy to do so.  Patient notes that he has had no associated chest pain, shortness of breath, abdominal pain, nausea, vomiting, diarrhea.  He notes he feels overall weak at this point.  He denies any associated unilateral weakness, numbness or paresthesias.  He denies any associated vision or hearing changes.  He denies any active headache at this point.   Dizziness      Prior to Admission medications   Medication Sig Start Date End Date Taking? Authorizing Provider  acetaminophen  (TYLENOL ) 500 MG tablet Take 500 mg by mouth every 6 (six) hours as needed for mild pain (pain score 1-3).    [provider]  aspirin  EC 81 MG tablet Take 1 tablet (81 mg total) by mouth daily with breakfast. 06/09/24   Pearlean, Courage, MD  Cholecalciferol  25 MCG (1000 UT) capsule Take 5 capsules (5,000 Units total) by mouth daily. 06/09/24   Pearlean Manus, MD  folic acid  (FOLVITE ) 1 MG tablet Take 1 tablet (1 mg total) by mouth daily. 06/10/24   Pearlean Manus, MD  gabapentin  (NEURONTIN ) 300 MG capsule Take 1 capsule (300 mg total) by mouth at bedtime. Patient taking differently: Take 300 mg by mouth 2 (two) times daily. 02/04/24   [name] [name], MD  nicotine  (NICODERM CQ  - DOSED IN MG/24 HOURS) 14 mg/24hr patch Place 1 patch (14 mg total) onto the skin daily. 06/10/24   Pearlean Manus, MD  omeprazole  (PRILOSEC) 20 MG capsule Take 1 capsule (20 mg total) by mouth daily.  06/09/24 08/08/24  Pearlean Manus, MD  oxyCODONE  (ROXICODONE ) 5 MG immediate release tablet Take 1 tablet (5 mg total) by mouth every 6 (six) hours as needed. Patient taking differently: Take 5 mg by mouth every 6 (six) hours as needed for moderate pain (pain score 4-6). 06/03/24 06/03/25  Agarwala, Gildardo, MD  sevelamer  carbonate (RENVELA ) 800 MG tablet Take 1 tablet (800 mg total) by mouth 3 (three) times daily with meals. 06/09/24   Pearlean Manus, MD  Tenapanor  HCl, CKD, (XPHOZAH ) 30 MG TABS Take 30 mg by mouth daily. 06/09/24   Pearlean Manus, MD    Allergies: Patient has no known allergies.    Review of Systems  Neurological:  Positive for dizziness.  All other systems reviewed and are negative.   Updated Vital Signs BP 108/74 (BP Location: Right Arm)   Pulse 87   Temp 97.6 F (36.4 C) (Oral)   Resp 16   Ht 5' 11 (1.803 m)   Wt 83.4 kg   SpO2 97%   BMI 25.63 kg/m   Physical Exam Vitals and nursing note reviewed.  Constitutional:      Appearance: Normal appearance.  HENT:     Head: Normocephalic and atraumatic.     Nose: Nose normal.     Mouth/Throat:     Mouth: Mucous membranes are moist.  Eyes:     Extraocular Movements: Extraocular movements intact.  Conjunctiva/sclera: Conjunctivae normal.     Pupils: Pupils are equal, round, and reactive to light.  Cardiovascular:     Rate and Rhythm: Normal rate and regular rhythm.     Pulses: Normal pulses.     Heart sounds: Normal heart sounds. No murmur heard.    No gallop.  Pulmonary:     Effort: Pulmonary effort is normal. No respiratory distress.     Breath sounds: Normal breath sounds. No stridor. No wheezing, rhonchi or rales.  Abdominal:     General: Abdomen is flat. Bowel sounds are normal. There is no distension.     Palpations: Abdomen is soft.     Tenderness: There is no abdominal tenderness. There is no guarding.  Musculoskeletal:        General: Normal range of motion.     Cervical back: Normal range  of motion and neck supple. No rigidity or tenderness.     Right lower leg: No edema.     Left lower leg: No edema.  Skin:    General: Skin is warm and dry.  Neurological:     General: No focal deficit present.     Mental Status: He is alert and oriented to person, place, and time. Mental status is at baseline.     Cranial Nerves: No cranial nerve deficit.     Sensory: No sensory deficit.     Motor: No weakness.     Coordination: Coordination normal.     Gait: Gait normal.     Comments: Normal rapid alternating movements, normal heel-to-shin, normal finger-to-nose  Psychiatric:        Mood and Affect: Mood normal.        Behavior: Behavior normal.        Thought Content: Thought content normal.        Judgment: Judgment normal.     (all labs ordered are listed, but only abnormal results are displayed) Labs Reviewed  COMPREHENSIVE METABOLIC PANEL WITH GFR - Abnormal; Notable for the following components:      Result Value   Chloride 88 (*)    CO2 33 (*)    Glucose, Bld 107 (*)    BUN 30 (*)    Creatinine, Ser 10.39 (*)    GFR, Estimated 5 (*)    All other components within normal limits  CBC WITH DIFFERENTIAL/PLATELET - Abnormal; Notable for the following components:   RBC 3.01 (*)    Hemoglobin 9.9 (*)    HCT 30.9 (*)    MCV 102.7 (*)    All other components within normal limits  MAGNESIUM - Abnormal; Notable for the following components:   Magnesium 4.7 (*)    All other components within normal limits  PHOSPHORUS - Abnormal; Notable for the following components:   Phosphorus 4.8 (*)    All other components within normal limits  TROPONIN I (HIGH SENSITIVITY) - Abnormal; Notable for the following components:   Troponin I (High Sensitivity) 20 (*)    All other components within normal limits  TROPONIN I (HIGH SENSITIVITY) - Abnormal; Notable for the following components:   Troponin I (High Sensitivity) 21 (*)    All other components within normal limits     EKG: None  Radiology: [facility] Chest Port 1 View Result Date: 06/21/2024 CLINICAL DATA:  Dyspnea. EXAM: PORTABLE CHEST 1 VIEW COMPARISON:  06/07/2024 FINDINGS: The lungs are clear without focal pneumonia, edema, pneumothorax or pleural effusion. The cardiopericardial silhouette is within normal limits for size. No acute bony abnormality.  Degenerative changes are noted in both shoulders. Telemetry leads overlie the chest. IMPRESSION: No active disease. Electronically Signed   By: Camellia Candle M.D.   On: 06/21/2024 13:09   CT Head Wo Contrast Result Date: 06/21/2024 CLINICAL DATA:  Dizziness. EXAM: CT HEAD WITHOUT CONTRAST TECHNIQUE: Contiguous axial images were obtained from the base of the skull through the vertex without intravenous contrast. RADIATION DOSE REDUCTION: This exam was performed according to the departmental dose-optimization program which includes automated exposure control, adjustment of the mA and/or kV according to patient size and/or use of iterative reconstruction technique. COMPARISON:  11/23/2023 FINDINGS: Brain: There is no evidence for acute hemorrhage, hydrocephalus, mass lesion, or abnormal extra-axial fluid collection. No definite CT evidence for acute infarction. Patchy low attenuation in the deep hemispheric and periventricular white matter is nonspecific, but likely reflects chronic microvascular ischemic demyelination. Left temporoparietal lobe infarct again noted, stable. Vascular: No hyperdense vessel or unexpected calcification. Skull: No evidence for acute fracture. Old left zygomatic arch fracture and posterior maxillary sinus wall fracture again noted. No worrisome lytic or sclerotic lesion. Sinuses/Orbits: The visualized paranasal sinuses and mastoid air cells are clear. Visualized portions of the globes and intraorbital fat are unremarkable. Other: None. IMPRESSION: 1. No acute intracranial abnormality. 2. Stable left temporoparietal lobe infarct. 3. Chronic small  vessel ischemic disease. Electronically Signed   By: Camellia Candle M.D.   On: 06/21/2024 13:09     Procedures   Medications Ordered in the ED  meclizine  (ANTIVERT ) tablet 25 mg (25 mg Oral Given 06/21/24 1453)                                    Medical Decision Making Amount and/or Complexity of Data Reviewed Labs: ordered. Radiology: ordered.   This patient presents to the ED for concern of dizziness, generalized weakness differential diagnosis includes CVA, TIA, space-occupying lesion, electrolyte derangement, dehydration, vertigo, Mnire's disease, Lyme arthritis    Additional history obtained:  Additional history obtained from medical records External records from outside source obtained and reviewed including medical records   Lab Tests:  I Ordered, and personally interpreted labs.  The pertinent results include: No leukocytosis, anemia at baseline, elevated creatinine to baseline, unremarkable electrolytes, normal liver function, stable serial troponins   Imaging Studies ordered:  I ordered imaging studies including MRI brain, chest x-ray, head CT I independently visualized and interpreted imaging which showed no acute ischemic changes in the brain, no acute cardiopulmonary process, no acute intracranial hemorrhage I agree with the radiologist interpretation   Medicines ordered and prescription drug management:  I ordered medication including meclizine  for dizziness Reevaluation of the patient after these medicines showed that the patient improved I have reviewed the patients home medicines and have made adjustments as needed   Problem List / ED Course:  Patient is doing well at this time and is stable for discharge home.  Discussed with patient that all workup in the emergency department has been overall unremarkable.  He does not warrant emergent dialysis at this time.  Patient will follow-up on an outpatient basis to continue dialysis.  He does not appear to  be fluid overloaded at this point and electrolytes are unremarkable.  EKG demonstrates no arrhythmia.  MRI brain demonstrated no acute ischemic changes.  Did discuss MRI findings with Dr. Voncile with neurology who did agree that everything is chronic in nature at this point.  Dizziness has resolved  with meclizine  in the emergency department and he has been ambulatory in the emergency department under his own power and without difficulty.  Do not suspect that admission is warranted at this time.  Vital signs have remained stable as well.  Strict return precautions were provided for any new or worsening symptoms.  Patient voiced understanding and had no additional questions.  Patient was fully evaluated by attending physician who is in agreement to plan.   Social Determinants of Health:  None        Final diagnoses:  None    ED Discharge Orders     None          Daralene Lonni JONETTA DEVONNA 06/21/24 1828    Simon Lavonia SAILOR, MD 06/22/24 [phone]

## 2024-06-21 NOTE — Discharge Instructions (Signed)
 Please follow-up closely with your primary care doctor on an outpatient basis.  Please return to your dialysis center for dialysis as scheduled.  Return to emergency department immediately for any new or worsening symptoms.

## 2024-06-23 ENCOUNTER — Inpatient Hospital Stay: Payer: Self-pay | Admitting: Internal Medicine

## 2024-06-24 DIAGNOSIS — R52 Pain, unspecified: Secondary | ICD-10-CM | POA: Diagnosis not present

## 2024-06-24 DIAGNOSIS — E1122 Type 2 diabetes mellitus with diabetic chronic kidney disease: Secondary | ICD-10-CM | POA: Diagnosis not present

## 2024-06-24 DIAGNOSIS — E039 Hypothyroidism, unspecified: Secondary | ICD-10-CM | POA: Diagnosis not present

## 2024-06-24 DIAGNOSIS — N186 End stage renal disease: Secondary | ICD-10-CM | POA: Diagnosis not present

## 2024-06-24 DIAGNOSIS — R197 Diarrhea, unspecified: Secondary | ICD-10-CM | POA: Diagnosis not present

## 2024-06-24 DIAGNOSIS — Z992 Dependence on renal dialysis: Secondary | ICD-10-CM | POA: Diagnosis not present

## 2024-06-24 DIAGNOSIS — D689 Coagulation defect, unspecified: Secondary | ICD-10-CM | POA: Diagnosis not present

## 2024-06-24 DIAGNOSIS — R110 Nausea: Secondary | ICD-10-CM | POA: Diagnosis not present

## 2024-06-25 ENCOUNTER — Ambulatory Visit: Payer: Self-pay

## 2024-06-25 ENCOUNTER — Other Ambulatory Visit: Payer: Self-pay | Admitting: Internal Medicine

## 2024-06-25 ENCOUNTER — Telehealth: Payer: Self-pay

## 2024-06-25 DIAGNOSIS — G609 Hereditary and idiopathic neuropathy, unspecified: Secondary | ICD-10-CM

## 2024-06-25 MED ORDER — GABAPENTIN 300 MG PO CAPS: 300.0000 mg | ORAL_CAPSULE | Freq: Two times a day (BID) | ORAL | 1 refills | Status: DC

## 2024-06-25 MED ORDER — GABAPENTIN 300 MG PO CAPS
300.0000 mg | ORAL_CAPSULE | Freq: Every day | ORAL | 1 refills | Status: DC
Start: 2024-06-25 — End: 2024-06-25

## 2024-06-25 NOTE — Telephone Encounter (Signed)
 FYI Only or Action Required?: Action required by provider: medication refill request.  Patient was last seen in primary care on 06/13/2024 by Glennon Sand, NP.  Called Nurse Triage reporting Pruritis.  Symptoms began chronic.  Interventions attempted: Other: out of med.  Symptoms are: gradually worsening.  Triage Disposition: Call PCP When Office is Open  Patient/caregiver understands and will follow disposition?: No, wishes to speak with PCP  Reason for Disposition  [1] Prescription refill request for NON-ESSENTIAL medicine (i.e., no harm to patient if med not taken) AND [2] triager unable to refill per department policy  Additional Information  Negative: [1] MODERATE-SEVERE local itching (i.e., interferes with work, school, activities) AND [2] not improved after 24 hours of hydrocortisone cream    Has not tried hydrocortisone. Manages itching with gabapentin   Answer Assessment - Initial Assessment Questions 1. DESCRIPTION: Describe the itching you are having. Where is it located?     Bottom of feet and right hand 2. SEVERITY: How bad is it?      intense 3. SCRATCHING: Are there any scratch marks? Bleeding?      4. ONSET: When did the itching begin? (e.g., minutes, hours, days ago)     Ongoing-chronic-manages with gabapentin  5. CAUSE: What do you think is causing the itching?      Neuropathy  6. OTHER SYMPTOMS: Do you have any other symptoms? (e.g., fever, rash)     None  Answer Assessment - Initial Assessment Questions 1. DRUG NAME: What medicine do you need to have refilled?    Gabapentin   Caller states he is at pharmacy now he was advised to call doctor for refill order. This Clinical research associate advised patient of 3 day TAT, he insists on speaking with Doctor to send rx now. Called to [facility], Dr. Tobie nurse is with a patient at this time, will make her aware of patient urgent request. This writer rejoined call with [name] and let him know that Dr. Tobie care team  will be alerted of request, he wanted to wait on hold but accepting of giving team time and will call back if not at pharmacy by end of day.  Protocols used: Itching - Localized-A-AH, Medication Refill and Renewal Call-A-AH

## 2024-06-25 NOTE — Telephone Encounter (Unsigned)
 Copied from CRM #[id number]. Topic: Clinical - Medication Question >> Jun 25, 2024  4:15 PM Sophia H wrote: Reason for CRM: checking in on gabapentin  refill - per chart show : Receipt confirmed by pharmacy (06/25/2024  3:39 PM EDT)  30 day supply with 1 refill 06/25/2024-06/25/2025   Spoke with pharmacist Hoy - stated the medication is treated as controlled substance & is needing approval since he cannot get it filled till Sept 28. Was given 90 day supply back in June and patient is not understanding with original orders it is not due for refill yet. Patient states 1 capsule per night does not help his itching, has been taking 2-3 per night which is why he is out.

## 2024-06-25 NOTE — Telephone Encounter (Signed)
 Copied from CRM [id number]. Topic: Clinical - Medication Refill >> Jun 25, 2024  3:16 PM Antwanette L wrote: Medication: gabapentin  (NEURONTIN ) 300 MG capsule  Has the patient contacted their pharmacy? Yes   This is the patient's preferred pharmacy:  [facility] - [place], [place] - [address] [place] [address] Phone: [phone] Fax: [phone]  Is this the correct pharmacy for this prescription? Yes   Has the prescription been filled recently? Yes. Last refill was on 02/04/24  Is the patient out of the medication? Yes  Has the patient been seen for an appointment in the last year OR does the patient have an upcoming appointment? Yes. Last ov with Dr.Patel was on 02/04/24 and pt saw Mitzie Carpen on 06/13/24  Can we respond through MyChart? No. Contact the patient by phone at [phone]  Agent: Please be advised that Rx refills may take up to 3 business days. We ask that you follow-up with your pharmacy.

## 2024-06-27 ENCOUNTER — Encounter: Payer: Self-pay | Admitting: Internal Medicine

## 2024-06-27 ENCOUNTER — Ambulatory Visit (INDEPENDENT_AMBULATORY_CARE_PROVIDER_SITE_OTHER): Admitting: Internal Medicine

## 2024-06-27 VITALS — BP 108/68 | HR 89 | Ht 72.0 in | Wt 197.4 lb

## 2024-06-27 DIAGNOSIS — Z992 Dependence on renal dialysis: Secondary | ICD-10-CM

## 2024-06-27 DIAGNOSIS — N186 End stage renal disease: Secondary | ICD-10-CM | POA: Diagnosis not present

## 2024-06-27 DIAGNOSIS — I10 Essential (primary) hypertension: Secondary | ICD-10-CM | POA: Diagnosis not present

## 2024-06-27 DIAGNOSIS — I5032 Chronic diastolic (congestive) heart failure: Secondary | ICD-10-CM | POA: Diagnosis not present

## 2024-06-27 DIAGNOSIS — D631 Anemia in chronic kidney disease: Secondary | ICD-10-CM | POA: Diagnosis not present

## 2024-06-27 DIAGNOSIS — E875 Hyperkalemia: Secondary | ICD-10-CM

## 2024-06-27 DIAGNOSIS — G609 Hereditary and idiopathic neuropathy, unspecified: Secondary | ICD-10-CM

## 2024-06-27 NOTE — Assessment & Plan Note (Signed)
 Likely due to missed HD sessions Advised to continue HD regularly

## 2024-06-27 NOTE — Assessment & Plan Note (Signed)
 Now on HD (TThSa) Needs to be compliant to his HD sessions and medications, needs to discuss with Nephrology for hypotensive spells

## 2024-06-27 NOTE — Assessment & Plan Note (Signed)
 BP Readings from Last 1 Encounters:  06/27/24 108/68   Well-controlled now Followed by nephrology Was on Coreg  25 mg twice daily and nifedipine  60 mg daily -recently discontinued due to episodes of hypotension Counseled for compliance with the medications Advised to follow renal diet as suggested by HD center and ambulate as tolerated

## 2024-06-27 NOTE — Patient Instructions (Signed)
 Please continue taking Gabapentin  for neuropathic pain.  Please do not take Carvedilol  and Nifedipine  for now.  Please follow low salt diet and ambulate as tolerated.

## 2024-06-27 NOTE — Assessment & Plan Note (Signed)
 Has had leg swelling and pulmonary vascular congestion due to missed HD Has minimal urine output Was on Coreg , had to be discontinued due to hypotensive spells Advised to continue with HD

## 2024-06-27 NOTE — Assessment & Plan Note (Signed)
 Denies any signs of active bleeding Last CBC reviewed, Hb 9.9 Likely due to ESRD Advised to take oral iron supplement Will defer erythropoietin and IV iron supplement to nephrology

## 2024-06-27 NOTE — Progress Notes (Signed)
 Established Patient Office Visit  Subjective:  Patient ID: Charles [name], male    DOB: 08-30-1956  Age: 68 y.o. MRN: [id number]  CC:  Chief Complaint  Patient presents with   Follow-up    Hospital f/u, reports feeling extreme fatigue.     HPI Charles [name] is a 68 y.o. male with past medical history of HTN, ESRD on HD, BPH, anemia of chronic disease, prediabetes, chronic constipation and tobacco abuse who presents for follow-up after recent hospitalization.  He was recently admitted at [facility] due to hyperkalemia in the setting of missed HD sessions from 06/07/24-06/09/24. He received hyperkalemia cocktail including IV dextrose , IV insulin , albuterol , calcium  chloride and bicarb and Lokelma .  Urgent HD session was arranged during hospitalization.  He was advised to maintain his HD sessions regularly.  His carvedilol  and nifedipine  were discontinued due to hypotension.  He still reports dizziness, worse upon standing up.  He has not missed HD session in this week, according to him.  He reports bilateral feet tingling, worse recently.  Of note, he takes gabapentin  300 mg nightly for neuropathy.   Past Medical History:  Diagnosis Date   Anemia    Anxiety    Chronic back pain    Dementia    Nursing facility feels he has Dementia   Diabetes mellitus    Pt reports no longer has   Facial numbness    Fracture of right foot    Hepatitis    B and C   Hypertension    Mental disorder    schizophrenia   Renal insufficiency    Dialysis T, TH, Sat    Past Surgical History:  Procedure Laterality Date   AV FISTULA PLACEMENT Left 12/20/2021   Procedure: LEFT ARM ARTERIOVENOUS (AV) FISTULA CREATION;  Surgeon: Oris Krystal FALCON, MD;  Location: [facility];  Service: Vascular;  Laterality: Left;   AV FISTULA PLACEMENT Left 03/13/2023   Procedure: INSERTION OF LEFT ARM ARTERIOVENOUS (AV) GORE-TEX GRAFT;  Surgeon: Oris Krystal FALCON, MD;  Location: [facility];  Service: Vascular;  Laterality:  Left;   BIOPSY  08/02/2021   Procedure: BIOPSY;  Surgeon: Eartha Angelia Sieving, MD;  Location: [facility];  Service: Gastroenterology;;  duodenal and gastric biopsies   CARPAL TUNNEL RELEASE Right 06/03/2024   Procedure: CARPAL TUNNEL RELEASE;  Surgeon: Arlinda Buster, MD;  Location: [facility];  Service: Orthopedics;  Laterality: Right;  RIGHT OPEN CARPAL TUNNEL RELEASE, RIGHT HAND MASS EXCISION   COLONOSCOPY N/A 04/11/2018   Procedure: COLONOSCOPY;  Surgeon: Golda Claudis PENNER, MD;  Location: [facility];  Service: Endoscopy;  Laterality: N/A;  10:30   COLONOSCOPY WITH PROPOFOL  N/A 07/05/2018   Procedure: COLONOSCOPY WITH PROPOFOL ;  Surgeon: Golda Claudis PENNER, MD;  [facility]: [facility];  Service: Endoscopy;  Laterality: N/A;  10:35   COLONOSCOPY WITH PROPOFOL  N/A 08/02/2021   Procedure: COLONOSCOPY WITH PROPOFOL ;  Surgeon: Eartha Angelia Sieving, MD;  Location: [facility];  Service: Gastroenterology;  Laterality: N/A;  9:05   CYST REMOVAL HAND Right 06/03/2024   Procedure: REMOVAL, CYST, HAND;  Surgeon: Arlinda Buster, MD;  Location: [facility];  Service: Orthopedics;  Laterality: Right;   ESOPHAGOGASTRODUODENOSCOPY (EGD) WITH PROPOFOL  N/A 08/02/2021   Procedure: ESOPHAGOGASTRODUODENOSCOPY (EGD) WITH PROPOFOL ;  Surgeon: Eartha Angelia Sieving, MD;  Location: [facility];  Service: Gastroenterology;  Laterality: N/A;   left 1st toe     ORIF TOE FRACTURE  09/05/2011   Procedure: OPEN REDUCTION INTERNAL FIXATION (ORIF) METATARSAL (  TOE) FRACTURE;  Surgeon: Deward DELENA Schwartz;  Location: [facility];  Service: Orthopedics;  Laterality: Left;  reconstruction left great toe FHB plantar plate   PERIPHERAL VASCULAR BALLOON ANGIOPLASTY  11/02/2023   Procedure: PERIPHERAL VASCULAR BALLOON ANGIOPLASTY;  Surgeon: Melia Lynwood ORN, MD;  Location: [facility];  Service: Cardiovascular;;   PERIPHERAL VASCULAR THROMBECTOMY N/A 11/02/2023   Procedure: PERIPHERAL VASCULAR THROMBECTOMY;  Surgeon:  Melia Lynwood ORN, MD;  Location: [facility];  Service: Cardiovascular;  Laterality: N/A;   POLYPECTOMY  04/11/2018   Procedure: POLYPECTOMY;  Surgeon: Golda Claudis PENNER, MD;  [facility];  Service: Endoscopy;;  colon   POLYPECTOMY  07/05/2018   Procedure: POLYPECTOMY;  Surgeon: Golda Claudis PENNER, MD;  [facility]: [facility];  Service: Endoscopy;;  colon   POLYPECTOMY  08/02/2021   Procedure: POLYPECTOMY;  Surgeon: Eartha Flavors, Toribio, MD;  Location: [facility];  Service: Gastroenterology;;  transverse colon polyp x 1 ascending colon polyp   right foot suger      Family History  Problem Relation Age of Onset   Diabetes Brother    Colon cancer Neg Hx    Liver disease Neg Hx        unknown for sure, mom died at age 63    Social History   Socioeconomic History   Marital status: Single    Spouse name: Not on file   Number of children: Not on file   Years of education: Not on file   Highest education level: Not on file  Occupational History   Not on file  Tobacco Use   Smoking status: Every Day    Current packs/day: 0.25    Average packs/day: 0.3 packs/day for 40.0 years (10.0 ttl pk-yrs)    Types: Cigarettes   Smokeless tobacco: Never   Tobacco comments:    Smoke   Vaping Use   Vaping status: Never Used  Substance and Sexual Activity   Alcohol use: Yes    Alcohol/week: 6.0 standard drinks of alcohol    Types: 6 Cans of beer per week    Comment: every other day   Drug use: No   Sexual activity: Not on file  Other Topics Concern   Not on file  Social History Narrative   Not on file   Social Drivers of Health   Financial Resource Strain: Not on file  Food Insecurity: No Food Insecurity (06/10/2024)   Hunger Vital Sign    Worried About Running Out of Food in the Last Year: Never true    Ran Out of Food in the Last Year: Never true  Transportation Needs: No Transportation Needs (06/10/2024)   PRAPARE - Administrator, Civil Service  (Medical): No    Lack of Transportation (Non-Medical): No  Physical Activity: Not on file  Stress: Not on file  Social Connections: Unknown (06/07/2024)   Social Connection and Isolation Panel    Frequency of Communication with Friends and Family: Patient declined    Frequency of Social Gatherings with Friends and Family: Patient declined    Attends Religious Services: Not on Insurance claims handler of Clubs or Organizations: Not on file    Attends Banker Meetings: Not on file    Marital Status: Not on file  Intimate Partner Violence: Not At Risk (06/10/2024)   Humiliation, Afraid, Rape, and Kick questionnaire    Fear of Current or Ex-Partner: No    Emotionally Abused: No  Physically Abused: No    Sexually Abused: No    Outpatient Medications Prior to Visit  Medication Sig Dispense Refill   acetaminophen  (TYLENOL ) 500 MG tablet Take 500 mg by mouth every 6 (six) hours as needed for mild pain (pain score 1-3).     aspirin  EC 81 MG tablet Take 1 tablet (81 mg total) by mouth daily with breakfast. 100 tablet 3   Cholecalciferol  25 MCG (1000 UT) capsule Take 5 capsules (5,000 Units total) by mouth daily. 30 capsule 6   folic acid  (FOLVITE ) 1 MG tablet Take 1 tablet (1 mg total) by mouth daily. 30 tablet 5   gabapentin  (NEURONTIN ) 300 MG capsule Take 1 capsule (300 mg total) by mouth 2 (two) times daily. 180 capsule 1   nicotine  (NICODERM CQ  - DOSED IN MG/24 HOURS) 14 mg/24hr patch Place 1 patch (14 mg total) onto the skin daily. 28 patch 0   omeprazole  (PRILOSEC) 20 MG capsule Take 1 capsule (20 mg total) by mouth daily. 30 capsule 5   oxyCODONE  (ROXICODONE ) 5 MG immediate release tablet Take 1 tablet (5 mg total) by mouth every 6 (six) hours as needed. (Patient taking differently: Take 5 mg by mouth every 6 (six) hours as needed for moderate pain (pain score 4-6).) 20 tablet 0   sevelamer  carbonate (RENVELA ) 800 MG tablet Take 1 tablet (800 mg total) by mouth 3 (three) times  daily with meals. 90 tablet 5   Tenapanor  HCl, CKD, (XPHOZAH ) 30 MG TABS Take 30 mg by mouth daily. 30 tablet 1   No facility-administered medications prior to visit.    No Known Allergies  ROS Review of Systems  Constitutional:  Positive for fatigue. Negative for chills and fever.  HENT:  Negative for congestion and sore throat.   Eyes:  Negative for pain and discharge.  Respiratory:  Negative for cough and shortness of breath.   Cardiovascular:  Negative for chest pain and palpitations.  Gastrointestinal:  Positive for constipation. Negative for diarrhea, nausea and vomiting.  Endocrine: Negative for polydipsia and polyuria.  Genitourinary:  Negative for dysuria and hematuria.  Musculoskeletal:  Positive for arthralgias. Negative for neck pain and neck stiffness.  Skin:  Negative for rash.  Neurological:  Positive for weakness (LE). Negative for numbness.  Psychiatric/Behavioral:  Positive for sleep disturbance. Negative for agitation and behavioral problems.       Objective:    Physical Exam Vitals reviewed.  Constitutional:      General: He is not in acute distress.    Appearance: He is not diaphoretic.  HENT:     Head: Normocephalic and atraumatic.     Nose: Nose normal.     Mouth/Throat:     Mouth: Mucous membranes are moist.  Eyes:     General: No scleral icterus.    Extraocular Movements: Extraocular movements intact.  Cardiovascular:     Rate and Rhythm: Normal rate and regular rhythm.     Pulses: Normal pulses.     Heart sounds: Normal heart sounds. No murmur heard. Pulmonary:     Breath sounds: Normal breath sounds. No wheezing or rales.  Musculoskeletal:     Cervical back: Neck supple. No tenderness.     Right lower leg: No edema.     Left lower leg: No edema.     Comments: S/p cyst removal, over dorsal aspect of hand between thumb and index finger, incision site - C/D/I  Skin:    General: Skin is warm.  Findings: No rash.     Comments: AV fistula  in left UE  Neurological:     General: No focal deficit present.     Mental Status: He is oriented to person, place, and time.  Psychiatric:        Mood and Affect: Mood normal. Affect is flat.     BP 108/68   Pulse 89   Ht 6' (1.829 m)   Wt 197 lb 6.4 oz (89.5 kg)   SpO2 95%   BMI 26.77 kg/m  Wt Readings from Last 3 Encounters:  06/27/24 197 lb 6.4 oz (89.5 kg)  06/21/24 183 lb 12.4 oz (83.4 kg)  06/13/24 197 lb (89.4 kg)     Lab Results  Component Value Date   WBC 5.2 06/21/2024   HGB 9.9 (L) 06/21/2024   HCT 30.9 (L) 06/21/2024   MCV 102.7 (H) 06/21/2024   PLT 256 06/21/2024   Lab Results  Component Value Date   NA 136 06/21/2024   K 4.9 06/21/2024   CO2 33 (H) 06/21/2024   GLUCOSE 107 (H) 06/21/2024   BUN 30 (H) 06/21/2024   CREATININE 10.39 (H) 06/21/2024   BILITOT 0.8 06/21/2024   ALKPHOS 52 06/21/2024   AST 16 06/21/2024   ALT 9 06/21/2024   PROT 7.8 06/21/2024   ALBUMIN  3.9 06/21/2024   CALCIUM  9.8 06/21/2024   ANIONGAP 15 06/21/2024   EGFR 11 (L) 10/09/2022   Lab Results  Component Value Date   CHOL 179 08/23/2022   Lab Results  Component Value Date   HDL 62 08/23/2022   Lab Results  Component Value Date   LDLCALC 91 08/23/2022   Lab Results  Component Value Date   TRIG 129 08/23/2022   Lab Results  Component Value Date   CHOLHDL 2.9 08/23/2022   Lab Results  Component Value Date   HGBA1C 5.0 02/04/2024      Assessment & Plan:   Problem List Items Addressed This Visit       Cardiovascular and Mediastinum   Essential hypertension - Primary (Chronic)   BP Readings from Last 1 Encounters:  06/27/24 108/68   Well-controlled now Followed by nephrology Was on Coreg  25 mg twice daily and nifedipine  60 mg daily -recently discontinued due to episodes of hypotension Counseled for compliance with the medications Advised to follow renal diet as suggested by HD center and ambulate as tolerated      Chronic diastolic (congestive)  heart failure (HCC)   Has had leg swelling and pulmonary vascular congestion due to missed HD Has minimal urine output Was on Coreg , had to be discontinued due to hypotensive spells Advised to continue with HD        Nervous and Auditory   Idiopathic peripheral neuropathy   Has persistent bilateral leg tingling and numbness Takes gabapentin  300 mg qHS Increase dose of gabapentin  to 300 mg BID for now        Genitourinary   ESRD (end stage renal disease) on dialysis (HCC)   Now on HD (TThSa) Needs to be compliant to his HD sessions and medications, needs to discuss with Nephrology for hypotensive spells        Other   Acute hyperkalemia   Likely due to missed HD sessions Advised to continue HD regularly      Anemia of chronic renal failure, stage 5 (HCC)   Denies any signs of active bleeding Last CBC reviewed, Hb 9.9 Likely due to ESRD Advised to take oral iron supplement  Will defer erythropoietin and IV iron supplement to nephrology       No orders of the defined types were placed in this encounter.   Follow-up: Return in about 4 months (around 10/27/2024) for HTN and ESRD.    Suzzane MARLA Blanch, MD

## 2024-06-27 NOTE — Assessment & Plan Note (Signed)
 Has persistent bilateral leg tingling and numbness Takes gabapentin  300 mg qHS Increase dose of gabapentin  to 300 mg BID for now

## 2024-06-30 DIAGNOSIS — Z992 Dependence on renal dialysis: Secondary | ICD-10-CM | POA: Diagnosis not present

## 2024-06-30 DIAGNOSIS — N186 End stage renal disease: Secondary | ICD-10-CM | POA: Diagnosis not present

## 2024-07-01 DIAGNOSIS — R52 Pain, unspecified: Secondary | ICD-10-CM | POA: Diagnosis not present

## 2024-07-01 DIAGNOSIS — R197 Diarrhea, unspecified: Secondary | ICD-10-CM | POA: Diagnosis not present

## 2024-07-01 DIAGNOSIS — N2581 Secondary hyperparathyroidism of renal origin: Secondary | ICD-10-CM | POA: Diagnosis not present

## 2024-07-01 DIAGNOSIS — Z992 Dependence on renal dialysis: Secondary | ICD-10-CM | POA: Diagnosis not present

## 2024-07-01 DIAGNOSIS — D689 Coagulation defect, unspecified: Secondary | ICD-10-CM | POA: Diagnosis not present

## 2024-07-01 DIAGNOSIS — D631 Anemia in chronic kidney disease: Secondary | ICD-10-CM | POA: Diagnosis not present

## 2024-07-01 DIAGNOSIS — E1122 Type 2 diabetes mellitus with diabetic chronic kidney disease: Secondary | ICD-10-CM | POA: Diagnosis not present

## 2024-07-01 DIAGNOSIS — N186 End stage renal disease: Secondary | ICD-10-CM | POA: Diagnosis not present

## 2024-07-03 DIAGNOSIS — Z992 Dependence on renal dialysis: Secondary | ICD-10-CM | POA: Diagnosis not present

## 2024-07-03 DIAGNOSIS — E1122 Type 2 diabetes mellitus with diabetic chronic kidney disease: Secondary | ICD-10-CM | POA: Diagnosis not present

## 2024-07-03 DIAGNOSIS — R197 Diarrhea, unspecified: Secondary | ICD-10-CM | POA: Diagnosis not present

## 2024-07-03 DIAGNOSIS — N2581 Secondary hyperparathyroidism of renal origin: Secondary | ICD-10-CM | POA: Diagnosis not present

## 2024-07-03 DIAGNOSIS — D689 Coagulation defect, unspecified: Secondary | ICD-10-CM | POA: Diagnosis not present

## 2024-07-03 DIAGNOSIS — D631 Anemia in chronic kidney disease: Secondary | ICD-10-CM | POA: Diagnosis not present

## 2024-07-03 DIAGNOSIS — R52 Pain, unspecified: Secondary | ICD-10-CM | POA: Diagnosis not present

## 2024-07-03 DIAGNOSIS — N186 End stage renal disease: Secondary | ICD-10-CM | POA: Diagnosis not present

## 2024-07-05 DIAGNOSIS — E1122 Type 2 diabetes mellitus with diabetic chronic kidney disease: Secondary | ICD-10-CM | POA: Diagnosis not present

## 2024-07-05 DIAGNOSIS — Z992 Dependence on renal dialysis: Secondary | ICD-10-CM | POA: Diagnosis not present

## 2024-07-05 DIAGNOSIS — D689 Coagulation defect, unspecified: Secondary | ICD-10-CM | POA: Diagnosis not present

## 2024-07-05 DIAGNOSIS — R197 Diarrhea, unspecified: Secondary | ICD-10-CM | POA: Diagnosis not present

## 2024-07-05 DIAGNOSIS — N2581 Secondary hyperparathyroidism of renal origin: Secondary | ICD-10-CM | POA: Diagnosis not present

## 2024-07-05 DIAGNOSIS — D631 Anemia in chronic kidney disease: Secondary | ICD-10-CM | POA: Diagnosis not present

## 2024-07-05 DIAGNOSIS — R52 Pain, unspecified: Secondary | ICD-10-CM | POA: Diagnosis not present

## 2024-07-05 DIAGNOSIS — N186 End stage renal disease: Secondary | ICD-10-CM | POA: Diagnosis not present

## 2024-07-08 DIAGNOSIS — E1122 Type 2 diabetes mellitus with diabetic chronic kidney disease: Secondary | ICD-10-CM | POA: Diagnosis not present

## 2024-07-08 DIAGNOSIS — Z992 Dependence on renal dialysis: Secondary | ICD-10-CM | POA: Diagnosis not present

## 2024-07-08 DIAGNOSIS — N2581 Secondary hyperparathyroidism of renal origin: Secondary | ICD-10-CM | POA: Diagnosis not present

## 2024-07-08 DIAGNOSIS — R197 Diarrhea, unspecified: Secondary | ICD-10-CM | POA: Diagnosis not present

## 2024-07-08 DIAGNOSIS — R52 Pain, unspecified: Secondary | ICD-10-CM | POA: Diagnosis not present

## 2024-07-08 DIAGNOSIS — D689 Coagulation defect, unspecified: Secondary | ICD-10-CM | POA: Diagnosis not present

## 2024-07-08 DIAGNOSIS — N186 End stage renal disease: Secondary | ICD-10-CM | POA: Diagnosis not present

## 2024-07-08 DIAGNOSIS — D631 Anemia in chronic kidney disease: Secondary | ICD-10-CM | POA: Diagnosis not present

## 2024-07-10 DIAGNOSIS — R52 Pain, unspecified: Secondary | ICD-10-CM | POA: Diagnosis not present

## 2024-07-10 DIAGNOSIS — D689 Coagulation defect, unspecified: Secondary | ICD-10-CM | POA: Diagnosis not present

## 2024-07-10 DIAGNOSIS — N2581 Secondary hyperparathyroidism of renal origin: Secondary | ICD-10-CM | POA: Diagnosis not present

## 2024-07-10 DIAGNOSIS — R197 Diarrhea, unspecified: Secondary | ICD-10-CM | POA: Diagnosis not present

## 2024-07-10 DIAGNOSIS — N186 End stage renal disease: Secondary | ICD-10-CM | POA: Diagnosis not present

## 2024-07-10 DIAGNOSIS — E1122 Type 2 diabetes mellitus with diabetic chronic kidney disease: Secondary | ICD-10-CM | POA: Diagnosis not present

## 2024-07-10 DIAGNOSIS — Z992 Dependence on renal dialysis: Secondary | ICD-10-CM | POA: Diagnosis not present

## 2024-07-10 DIAGNOSIS — D631 Anemia in chronic kidney disease: Secondary | ICD-10-CM | POA: Diagnosis not present

## 2024-07-11 ENCOUNTER — Encounter (INDEPENDENT_AMBULATORY_CARE_PROVIDER_SITE_OTHER): Payer: Self-pay | Admitting: *Deleted

## 2024-07-12 DIAGNOSIS — Z992 Dependence on renal dialysis: Secondary | ICD-10-CM | POA: Diagnosis not present

## 2024-07-12 DIAGNOSIS — D631 Anemia in chronic kidney disease: Secondary | ICD-10-CM | POA: Diagnosis not present

## 2024-07-12 DIAGNOSIS — D689 Coagulation defect, unspecified: Secondary | ICD-10-CM | POA: Diagnosis not present

## 2024-07-12 DIAGNOSIS — N2581 Secondary hyperparathyroidism of renal origin: Secondary | ICD-10-CM | POA: Diagnosis not present

## 2024-07-12 DIAGNOSIS — N186 End stage renal disease: Secondary | ICD-10-CM | POA: Diagnosis not present

## 2024-07-12 DIAGNOSIS — R52 Pain, unspecified: Secondary | ICD-10-CM | POA: Diagnosis not present

## 2024-07-12 DIAGNOSIS — E1122 Type 2 diabetes mellitus with diabetic chronic kidney disease: Secondary | ICD-10-CM | POA: Diagnosis not present

## 2024-07-12 DIAGNOSIS — R197 Diarrhea, unspecified: Secondary | ICD-10-CM | POA: Diagnosis not present

## 2024-07-15 DIAGNOSIS — E1122 Type 2 diabetes mellitus with diabetic chronic kidney disease: Secondary | ICD-10-CM | POA: Diagnosis not present

## 2024-07-15 DIAGNOSIS — N2581 Secondary hyperparathyroidism of renal origin: Secondary | ICD-10-CM | POA: Diagnosis not present

## 2024-07-15 DIAGNOSIS — R52 Pain, unspecified: Secondary | ICD-10-CM | POA: Diagnosis not present

## 2024-07-15 DIAGNOSIS — R197 Diarrhea, unspecified: Secondary | ICD-10-CM | POA: Diagnosis not present

## 2024-07-15 DIAGNOSIS — D631 Anemia in chronic kidney disease: Secondary | ICD-10-CM | POA: Diagnosis not present

## 2024-07-15 DIAGNOSIS — D689 Coagulation defect, unspecified: Secondary | ICD-10-CM | POA: Diagnosis not present

## 2024-07-15 DIAGNOSIS — N186 End stage renal disease: Secondary | ICD-10-CM | POA: Diagnosis not present

## 2024-07-15 DIAGNOSIS — Z992 Dependence on renal dialysis: Secondary | ICD-10-CM | POA: Diagnosis not present

## 2024-07-17 DIAGNOSIS — I132 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 5 chronic kidney disease, or end stage renal disease: Secondary | ICD-10-CM | POA: Diagnosis not present

## 2024-07-17 DIAGNOSIS — R52 Pain, unspecified: Secondary | ICD-10-CM | POA: Diagnosis not present

## 2024-07-17 DIAGNOSIS — N2581 Secondary hyperparathyroidism of renal origin: Secondary | ICD-10-CM | POA: Diagnosis not present

## 2024-07-17 DIAGNOSIS — D689 Coagulation defect, unspecified: Secondary | ICD-10-CM | POA: Diagnosis not present

## 2024-07-17 DIAGNOSIS — T82868A Thrombosis of vascular prosthetic devices, implants and grafts, initial encounter: Secondary | ICD-10-CM | POA: Diagnosis not present

## 2024-07-17 DIAGNOSIS — D631 Anemia in chronic kidney disease: Secondary | ICD-10-CM | POA: Diagnosis not present

## 2024-07-17 DIAGNOSIS — E1122 Type 2 diabetes mellitus with diabetic chronic kidney disease: Secondary | ICD-10-CM | POA: Diagnosis not present

## 2024-07-17 DIAGNOSIS — N186 End stage renal disease: Secondary | ICD-10-CM | POA: Diagnosis not present

## 2024-07-17 DIAGNOSIS — T82858A Stenosis of vascular prosthetic devices, implants and grafts, initial encounter: Secondary | ICD-10-CM | POA: Diagnosis not present

## 2024-07-17 DIAGNOSIS — R197 Diarrhea, unspecified: Secondary | ICD-10-CM | POA: Diagnosis not present

## 2024-07-17 DIAGNOSIS — Z992 Dependence on renal dialysis: Secondary | ICD-10-CM | POA: Diagnosis not present

## 2024-07-18 ENCOUNTER — Ambulatory Visit: Payer: Self-pay

## 2024-07-18 ENCOUNTER — Inpatient Hospital Stay: Payer: Self-pay | Admitting: Internal Medicine

## 2024-07-19 DIAGNOSIS — R52 Pain, unspecified: Secondary | ICD-10-CM | POA: Diagnosis not present

## 2024-07-19 DIAGNOSIS — Z992 Dependence on renal dialysis: Secondary | ICD-10-CM | POA: Diagnosis not present

## 2024-07-19 DIAGNOSIS — D689 Coagulation defect, unspecified: Secondary | ICD-10-CM | POA: Diagnosis not present

## 2024-07-19 DIAGNOSIS — N186 End stage renal disease: Secondary | ICD-10-CM | POA: Diagnosis not present

## 2024-07-19 DIAGNOSIS — D631 Anemia in chronic kidney disease: Secondary | ICD-10-CM | POA: Diagnosis not present

## 2024-07-19 DIAGNOSIS — R197 Diarrhea, unspecified: Secondary | ICD-10-CM | POA: Diagnosis not present

## 2024-07-19 DIAGNOSIS — N2581 Secondary hyperparathyroidism of renal origin: Secondary | ICD-10-CM | POA: Diagnosis not present

## 2024-07-19 DIAGNOSIS — E1122 Type 2 diabetes mellitus with diabetic chronic kidney disease: Secondary | ICD-10-CM | POA: Diagnosis not present

## 2024-07-22 DIAGNOSIS — E1122 Type 2 diabetes mellitus with diabetic chronic kidney disease: Secondary | ICD-10-CM | POA: Diagnosis not present

## 2024-07-22 DIAGNOSIS — N186 End stage renal disease: Secondary | ICD-10-CM | POA: Diagnosis not present

## 2024-07-22 DIAGNOSIS — D689 Coagulation defect, unspecified: Secondary | ICD-10-CM | POA: Diagnosis not present

## 2024-07-22 DIAGNOSIS — Z992 Dependence on renal dialysis: Secondary | ICD-10-CM | POA: Diagnosis not present

## 2024-07-22 DIAGNOSIS — N2581 Secondary hyperparathyroidism of renal origin: Secondary | ICD-10-CM | POA: Diagnosis not present

## 2024-07-22 DIAGNOSIS — R52 Pain, unspecified: Secondary | ICD-10-CM | POA: Diagnosis not present

## 2024-07-22 DIAGNOSIS — D631 Anemia in chronic kidney disease: Secondary | ICD-10-CM | POA: Diagnosis not present

## 2024-07-22 DIAGNOSIS — R197 Diarrhea, unspecified: Secondary | ICD-10-CM | POA: Diagnosis not present

## 2024-07-24 DIAGNOSIS — N186 End stage renal disease: Secondary | ICD-10-CM | POA: Diagnosis not present

## 2024-07-24 DIAGNOSIS — R197 Diarrhea, unspecified: Secondary | ICD-10-CM | POA: Diagnosis not present

## 2024-07-24 DIAGNOSIS — D689 Coagulation defect, unspecified: Secondary | ICD-10-CM | POA: Diagnosis not present

## 2024-07-24 DIAGNOSIS — R52 Pain, unspecified: Secondary | ICD-10-CM | POA: Diagnosis not present

## 2024-07-24 DIAGNOSIS — Z992 Dependence on renal dialysis: Secondary | ICD-10-CM | POA: Diagnosis not present

## 2024-07-24 DIAGNOSIS — D631 Anemia in chronic kidney disease: Secondary | ICD-10-CM | POA: Diagnosis not present

## 2024-07-24 DIAGNOSIS — E1122 Type 2 diabetes mellitus with diabetic chronic kidney disease: Secondary | ICD-10-CM | POA: Diagnosis not present

## 2024-07-24 DIAGNOSIS — N2581 Secondary hyperparathyroidism of renal origin: Secondary | ICD-10-CM | POA: Diagnosis not present

## 2024-07-26 DIAGNOSIS — E1122 Type 2 diabetes mellitus with diabetic chronic kidney disease: Secondary | ICD-10-CM | POA: Diagnosis not present

## 2024-07-26 DIAGNOSIS — D689 Coagulation defect, unspecified: Secondary | ICD-10-CM | POA: Diagnosis not present

## 2024-07-26 DIAGNOSIS — D631 Anemia in chronic kidney disease: Secondary | ICD-10-CM | POA: Diagnosis not present

## 2024-07-26 DIAGNOSIS — R52 Pain, unspecified: Secondary | ICD-10-CM | POA: Diagnosis not present

## 2024-07-26 DIAGNOSIS — Z992 Dependence on renal dialysis: Secondary | ICD-10-CM | POA: Diagnosis not present

## 2024-07-26 DIAGNOSIS — R197 Diarrhea, unspecified: Secondary | ICD-10-CM | POA: Diagnosis not present

## 2024-07-26 DIAGNOSIS — N186 End stage renal disease: Secondary | ICD-10-CM | POA: Diagnosis not present

## 2024-07-26 DIAGNOSIS — N2581 Secondary hyperparathyroidism of renal origin: Secondary | ICD-10-CM | POA: Diagnosis not present

## 2024-07-29 DIAGNOSIS — N2581 Secondary hyperparathyroidism of renal origin: Secondary | ICD-10-CM | POA: Diagnosis not present

## 2024-07-29 DIAGNOSIS — E1122 Type 2 diabetes mellitus with diabetic chronic kidney disease: Secondary | ICD-10-CM | POA: Diagnosis not present

## 2024-07-29 DIAGNOSIS — N186 End stage renal disease: Secondary | ICD-10-CM | POA: Diagnosis not present

## 2024-07-29 DIAGNOSIS — R52 Pain, unspecified: Secondary | ICD-10-CM | POA: Diagnosis not present

## 2024-07-29 DIAGNOSIS — D689 Coagulation defect, unspecified: Secondary | ICD-10-CM | POA: Diagnosis not present

## 2024-07-29 DIAGNOSIS — R197 Diarrhea, unspecified: Secondary | ICD-10-CM | POA: Diagnosis not present

## 2024-07-29 DIAGNOSIS — Z992 Dependence on renal dialysis: Secondary | ICD-10-CM | POA: Diagnosis not present

## 2024-07-29 DIAGNOSIS — D631 Anemia in chronic kidney disease: Secondary | ICD-10-CM | POA: Diagnosis not present

## 2024-07-30 DIAGNOSIS — Z992 Dependence on renal dialysis: Secondary | ICD-10-CM | POA: Diagnosis not present

## 2024-07-30 DIAGNOSIS — N186 End stage renal disease: Secondary | ICD-10-CM | POA: Diagnosis not present

## 2024-07-31 DIAGNOSIS — N186 End stage renal disease: Secondary | ICD-10-CM | POA: Diagnosis not present

## 2024-07-31 DIAGNOSIS — I132 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 5 chronic kidney disease, or end stage renal disease: Secondary | ICD-10-CM | POA: Diagnosis not present

## 2024-07-31 DIAGNOSIS — E1122 Type 2 diabetes mellitus with diabetic chronic kidney disease: Secondary | ICD-10-CM | POA: Diagnosis not present

## 2024-07-31 DIAGNOSIS — T82898A Other specified complication of vascular prosthetic devices, implants and grafts, initial encounter: Secondary | ICD-10-CM | POA: Diagnosis not present

## 2024-08-28 ENCOUNTER — Ambulatory Visit

## 2024-08-28 VITALS — Ht 72.0 in | Wt 200.6 lb

## 2024-08-28 DIAGNOSIS — Z0000 Encounter for general adult medical examination without abnormal findings: Secondary | ICD-10-CM

## 2024-08-28 DIAGNOSIS — Z1211 Encounter for screening for malignant neoplasm of colon: Secondary | ICD-10-CM

## 2024-08-28 NOTE — Patient Instructions (Signed)
 Charles [name],  Thank you for taking the time for your Medicare Wellness Visit. I appreciate your continued commitment to your health goals. Please review the care plan we discussed, and feel free to reach out if I can assist you further.  Medicare recommends these wellness visits once per year to help you and your care team stay ahead of potential health issues. These visits are designed to focus on prevention, allowing your provider to concentrate on managing your acute and chronic conditions during your regular appointments.  Please note that Annual Wellness Visits do not include a physical exam. Some assessments may be limited, especially if the visit was conducted virtually. If needed, we may recommend a separate in-person follow-up with your provider.  Ongoing Care  Seeing your primary care provider every 3 to 6 months helps us  monitor your health and provide consistent, personalized care.   Referrals   [facility] at  [address] Phone: [phone]   Recommended Screenings:  Health Maintenance  Topic Date Due   DTaP/Tdap/Td vaccine (1 - Tdap) Never done   Zoster (Shingles) Vaccine (1 of 2) Never done   Eye exam for diabetics  10/27/2022   COVID-19 Vaccine (1 - 2025-26 season) Never done   Colon Cancer Screening  08/02/2024   Hemoglobin A1C  08/05/2024   Flu Shot  01/27/2025*   Complete foot exam   10/02/2024   Medicare Annual Wellness Visit  08/28/2025   Pneumococcal Vaccine for age over 55  Completed   Hepatitis C Screening  Completed   Meningitis B Vaccine  Aged Out  *Topic was postponed. The date shown is not the original due date.       08/28/2024   11:37 AM  Advanced Directives  Does Patient Have a Medical Advance Directive? No  Would patient like information on creating a medical advance directive? No - Patient declined    Advance Care Planning is important because it: Ensures you receive medical care  that aligns with your values, goals, and preferences. Provides guidance to your family and loved ones, reducing the emotional burden of decision-making during critical moments.  Vision: Annual vision screenings are recommended for early detection of glaucoma, cataracts, and diabetic retinopathy. These exams can also reveal signs of chronic conditions such as diabetes and high blood pressure.  Dental: Annual dental screenings help detect early signs of oral cancer, gum disease, and other conditions linked to overall health, including heart disease and diabetes.  Please see the attached documents for additional preventive care recommendations.

## 2024-08-28 NOTE — Progress Notes (Signed)
 Subjective:   Charles [name] is a 68 y.o. who presents for a Medicare Wellness preventive visit.  As a reminder, Annual Wellness Visits don't include a physical exam, and some assessments may be limited, especially if this visit is performed virtually. We may recommend an in-person follow-up visit with your provider if needed.  Visit Complete: Virtual I connected with  Charles [name] on 08/28/24 by a audio enabled telemedicine application and verified that I am speaking with the correct person using two identifiers.  Patient Location: Home  Provider Location: Home Office  I discussed the limitations of evaluation and management by telemedicine. The patient expressed understanding and agreed to proceed.  Vital Signs: Because this visit was a virtual/telehealth visit, some criteria may be missing or patient reported. Any vitals not documented were not able to be obtained and vitals that have been documented are patient reported.  VideoDeclined- This patient declined Librarian, academic. Therefore the visit was completed with audio only.  Persons Participating in Visit: Patient.  AWV Questionnaire: No: Patient Medicare AWV questionnaire was not completed prior to this visit.  Cardiac Risk Factors include: advanced age (>73men, >14 women);diabetes mellitus;hypertension;male gender;sedentary lifestyle;smoking/ tobacco exposure;Other (see comment);dyslipidemia, Risk factor comments: heart disease     Objective:    Today's Vitals   08/28/24 1131  Weight: 200 lb 9.9 oz (91 kg)  Height: 6' (1.829 m)  PainSc: 0-No pain   Body mass index is 27.21 kg/m.     08/28/2024   11:37 AM 06/21/2024   11:56 AM 06/07/2024    1:50 PM 06/03/2024   10:47 AM 11/23/2023    4:29 PM 02/12/2023    5:18 PM 08/23/2022    2:35 AM  Advanced Directives  Does Patient Have a Medical Advance Directive? No No No No No No No  Would patient like information on creating a medical advance  directive? No - Patient declined No - Patient declined  No - Patient declined  No - Patient declined No - Patient declined    Current Medications (verified) Outpatient Encounter Medications as of 08/28/2024  Medication Sig   acetaminophen  (TYLENOL ) 500 MG tablet Take 500 mg by mouth every 6 (six) hours as needed for mild pain (pain score 1-3).   aspirin  EC 81 MG tablet Take 1 tablet (81 mg total) by mouth daily with breakfast.   Cholecalciferol  25 MCG (1000 UT) capsule Take 5 capsules (5,000 Units total) by mouth daily.   gabapentin  (NEURONTIN ) 300 MG capsule Take 1 capsule (300 mg total) by mouth 2 (two) times daily.   lidocaine -prilocaine (EMLA) cream Apply 1 Application topically once.   magnesium gluconate (MAGONATE) 500 (27 Mg) MG TABS tablet Take 500 mg by mouth in the morning and at bedtime.   sevelamer  carbonate (RENVELA ) 800 MG tablet Take 1 tablet (800 mg total) by mouth 3 (three) times daily with meals.   traMADol (ULTRAM) 50 MG tablet Take 50 mg by mouth every 8 (eight) hours as needed.   folic acid  (FOLVITE ) 1 MG tablet Take 1 tablet (1 mg total) by mouth daily. (Patient not taking: Reported on 08/28/2024)   nicotine  (NICODERM CQ  - DOSED IN MG/24 HOURS) 14 mg/24hr patch Place 1 patch (14 mg total) onto the skin daily. (Patient not taking: Reported on 08/28/2024)   omeprazole  (PRILOSEC) 20 MG capsule Take 1 capsule (20 mg total) by mouth daily. (Patient not taking: Reported on 08/28/2024)   Tenapanor  HCl, CKD, (XPHOZAH ) 30 MG TABS Take 30 mg by  mouth daily. (Patient not taking: Reported on 08/28/2024)   [DISCONTINUED] oxyCODONE  (ROXICODONE ) 5 MG immediate release tablet Take 1 tablet (5 mg total) by mouth every 6 (six) hours as needed. (Patient not taking: Reported on 08/28/2024)   No facility-administered encounter medications on file as of 08/28/2024.    Allergies (verified) Patient has no known allergies.   History: Past Medical History:  Diagnosis Date   Anemia     Anxiety    Chronic back pain    Dementia    Nursing facility feels he has Dementia   Diabetes mellitus    Pt reports no longer has   Facial numbness    Fracture of right foot    Hepatitis    B and C   Hypertension    Mental disorder    schizophrenia   Renal insufficiency    Dialysis T, TH, Sat   Past Surgical History:  Procedure Laterality Date   AV FISTULA PLACEMENT Left 12/20/2021   Procedure: LEFT ARM ARTERIOVENOUS (AV) FISTULA CREATION;  Surgeon: Oris Krystal FALCON, MD;  Location: [facility];  Service: Vascular;  Laterality: Left;   AV FISTULA PLACEMENT Left 03/13/2023   Procedure: INSERTION OF LEFT ARM ARTERIOVENOUS (AV) GORE-TEX GRAFT;  Surgeon: Oris Krystal FALCON, MD;  Location: [facility];  Service: Vascular;  Laterality: Left;   BIOPSY  08/02/2021   Procedure: BIOPSY;  Surgeon: Eartha Angelia Sieving, MD;  Location: [facility];  Service: Gastroenterology;;  duodenal and gastric biopsies   CARPAL TUNNEL RELEASE Right 06/03/2024   Procedure: CARPAL TUNNEL RELEASE;  Surgeon: Arlinda Buster, MD;  Location: [facility];  Service: Orthopedics;  Laterality: Right;  RIGHT OPEN CARPAL TUNNEL RELEASE, RIGHT HAND MASS EXCISION   COLONOSCOPY N/A 04/11/2018   Procedure: COLONOSCOPY;  Surgeon: Golda Claudis PENNER, MD;  Location: [facility];  Service: Endoscopy;  Laterality: N/A;  10:30   COLONOSCOPY WITH PROPOFOL  N/A 07/05/2018   Procedure: COLONOSCOPY WITH PROPOFOL ;  Surgeon: Golda Claudis PENNER, MD;  [facility]: [facility];  Service: Endoscopy;  Laterality: N/A;  10:35   COLONOSCOPY WITH PROPOFOL  N/A 08/02/2021   Procedure: COLONOSCOPY WITH PROPOFOL ;  Surgeon: Eartha Angelia Sieving, MD;  [facility]: [facility];  Service: Gastroenterology;  Laterality: N/A;  9:05   CYST REMOVAL HAND Right 06/03/2024   Procedure: REMOVAL, CYST, HAND;  Surgeon: Arlinda Buster, MD;  Location: [facility];  Service: Orthopedics;  Laterality: Right;   ESOPHAGOGASTRODUODENOSCOPY (EGD) WITH PROPOFOL  N/A 08/02/2021   Procedure:  ESOPHAGOGASTRODUODENOSCOPY (EGD) WITH PROPOFOL ;  Surgeon: Eartha Angelia Sieving, MD;  [facility]: [facility];  Service: Gastroenterology;  Laterality: N/A;   left 1st toe     ORIF TOE FRACTURE  09/05/2011   Procedure: OPEN REDUCTION INTERNAL FIXATION (ORIF) METATARSAL (TOE) FRACTURE;  Surgeon: Deward DELENA Schwartz;  Location: [facility];  Service: Orthopedics;  Laterality: Left;  reconstruction left great toe FHB plantar plate   PERIPHERAL VASCULAR BALLOON ANGIOPLASTY  11/02/2023   Procedure: PERIPHERAL VASCULAR BALLOON ANGIOPLASTY;  Surgeon: Melia Charles ORN, MD;  Location: [facility];  Service: Cardiovascular;;   PERIPHERAL VASCULAR THROMBECTOMY N/A 11/02/2023   Procedure: PERIPHERAL VASCULAR THROMBECTOMY;  Surgeon: Melia Charles ORN, MD;  [facility];  Service: Cardiovascular;  Laterality: N/A;   POLYPECTOMY  04/11/2018   Procedure: POLYPECTOMY;  Surgeon: Golda Claudis PENNER, MD;  [facility];  Service: Endoscopy;;  colon   POLYPECTOMY  07/05/2018   Procedure: POLYPECTOMY;  Surgeon: Golda Claudis PENNER, MD;  [facility];  Service: Endoscopy;;  colon   POLYPECTOMY  08/02/2021   Procedure: POLYPECTOMY;  Surgeon: Eartha Flavors, Toribio, MD;  Location: [facility];  Service: Gastroenterology;;  transverse colon polyp x 1 ascending colon polyp   right foot suger     Family History  Problem Relation Age of Onset   Diabetes Brother    Colon cancer Neg Hx    Liver disease Neg Hx        unknown for sure, mom died at age 6   Social History   Socioeconomic History   Marital status: Single    Spouse name: Not on file   Number of children: Not on file   Years of education: Not on file   Highest education level: Not on file  Occupational History   Not on file  Tobacco Use   Smoking status: Every Day    Current packs/day: 0.25    Average packs/day: 0.3 packs/day for 40.0 years (10.0 ttl pk-yrs)    Types: Cigarettes   Smokeless tobacco: Never    Tobacco comments:    Smoke   Vaping Use   Vaping status: Never Used  Substance and Sexual Activity   Alcohol use: Yes    Alcohol/week: 6.0 standard drinks of alcohol    Types: 6 Cans of beer per week    Comment: every other day   Drug use: No   Sexual activity: Not on file  Other Topics Concern   Not on file  Social History Narrative   Not on file   Social Drivers of Health   Financial Resource Strain: Low Risk  (08/28/2024)   Overall Financial Resource Strain (CARDIA)    Difficulty of Paying Living Expenses: Not hard at all  Food Insecurity: No Food Insecurity (08/28/2024)   Hunger Vital Sign    Worried About Running Out of Food in the Last Year: Never true    Ran Out of Food in the Last Year: Never true  Transportation Needs: No Transportation Needs (08/28/2024)   PRAPARE - Administrator, Civil Service (Medical): No    Lack of Transportation (Non-Medical): No  Physical Activity: Inactive (08/28/2024)   Exercise Vital Sign    Days of Exercise per Week: 0 days    Minutes of Exercise per Session: 0 min  Stress: No Stress Concern Present (08/28/2024)   [employer] of Occupational Health - Occupational Stress Questionnaire    Feeling of Stress: Not at all  Social Connections: Patient Declined (08/28/2024)   Social Connection and Isolation Panel    Frequency of Communication with Friends and Family: Patient declined    Frequency of Social Gatherings with Friends and Family: Patient declined    Attends Religious Services: Patient declined    Database Administrator or Organizations: Patient declined    Attends Engineer, Structural: Patient declined    Marital Status: Patient declined    Tobacco Counseling Ready to quit: No Counseling given: Yes Tobacco comments: Smoke     Clinical Intake:  Pre-visit preparation completed: Yes  Pain : No/denies pain Pain Score: 0-No pain     BMI - recorded: 27.21 Nutritional Risks: None Diabetes:  Yes CBG done?: No Did pt. bring in CBG monitor from home?: No  Lab Results  Component Value Date   HGBA1C 5.0 02/04/2024   HGBA1C 5.3 04/13/2022   HGBA1C (H) 06/27/2007    10.6 (NOTE)   The ADA recommends the following therapeutic goals for glycemic   control related  to Hgb A1C measurement:   Goal of Therapy:   < 7.0% Hgb A1C   Action Suggested:  > 8.0% Hgb A1C   Ref:  Diabetes Care, 22, Suppl. 1, 1999     How often do you need to have someone help you when you read instructions, pamphlets, or other written materials from your doctor or pharmacy?: 1 - Never  Interpreter Needed?: No  Information entered by :: Bryona Foxworthy W CMA (AAMA)   Activities of Daily Living     08/28/2024   11:33 AM 06/03/2024    9:53 AM  In your present state of health, do you have any difficulty performing the following activities:  Hearing? 0 0  Vision? 0 0  Difficulty concentrating or making decisions? 0 0  Walking or climbing stairs? 0   Dressing or bathing? 0   Doing errands, shopping? 0   Preparing Food and eating ? N   Using the Toilet? N   In the past six months, have you accidently leaked urine? N   Do you have problems with loss of bowel control? N   Managing your Medications? N   Managing your Finances? N   Housekeeping or managing your Housekeeping? N     Patient Care Team: [name] [name], MD as PCP - General (Internal Medicine) Stacia Diannah SQUIBB, MD as Consulting Physician (Cardiology) Eartha Flavors, Toribio, MD as Consulting Physician (Gastroenterology) Arlinda Buster, MD as Consulting Physician (Orthopedic Surgery) [facility] Rachele Gaynell RAMAN, MD as Referring Physician (Nephrology) Loreda Hacker, DPM as Consulting Physician (Podiatry)  I have updated your Care Teams any recent Medical Services you may have received from other providers in the past year.     Assessment:   This is a routine  wellness examination for Charles [name].  Hearing/Vision screen Hearing Screening - Comments:: Patient denies any hearing difficulties.   Vision Screening - Comments:: Patient is not up to date with eye exams. He will call and schedule appt   Goals Addressed             This Visit's Progress    remain active and healthy          Depression Screen     08/28/2024   11:40 AM 06/27/2024   11:00 AM 02/04/2024    2:20 PM 10/03/2023    2:31 PM 01/25/2023    8:29 AM 10/09/2022    2:45 PM 09/27/2022   11:19 AM  PHQ 2/9 Scores  PHQ - 2 Score 0 0 0 0 0 0 0  PHQ- 9 Score 0 0 0    0     Fall Risk     08/28/2024   11:39 AM 06/27/2024   11:00 AM 02/04/2024    2:20 PM 10/03/2023    2:31 PM 01/25/2023    8:29 AM  Fall Risk   Falls in the past year? 0 0 0 0 0  Number falls in past yr: 0 0 0 0 0  Injury with Fall? 0 0 0 0 0  Risk for fall due to : No Fall Risks No Fall Risks No Fall Risks No Fall Risks   Follow up Falls evaluation completed;Education provided;Falls prevention discussed Falls evaluation completed Falls evaluation completed Falls evaluation completed     MEDICARE RISK AT HOME:  Medicare Risk at Home Any stairs in or around the home?: Yes If so, are there any without handrails?: No Home free of loose throw rugs  in walkways, pet beds, electrical cords, etc?: Yes Adequate lighting in your home to reduce risk of falls?: Yes Life alert?: Yes Use of a cane, Graumann or w/c?: No Grab bars in the bathroom?: Yes Shower chair or bench in shower?: Yes Elevated toilet seat or a handicapped toilet?: Yes  TIMED UP AND GO:  Was the test performed?  No  Cognitive Function: 6CIT completed        08/28/2024   11:40 AM  6CIT Screen  What Year? 0 points  What month? 0 points  What time? 0 points  Count back from 20 0 points  Months in reverse 0 points  Repeat phrase 0 points  Total Score 0 points    Immunizations Immunization History  Administered Date(s) Administered    PNEUMOCOCCAL CONJUGATE-20 01/25/2023    Screening Tests Health Maintenance  Topic Date Due   DTaP/Tdap/Td (1 - Tdap) Never done   Zoster Vaccines- Shingrix (1 of 2) Never done   OPHTHALMOLOGY EXAM  10/27/2022   COVID-19 Vaccine (1 - 2025-26 season) Never done   Colonoscopy  08/02/2024   HEMOGLOBIN A1C  08/05/2024   Influenza Vaccine  01/27/2025 (Originally 05/30/2024)   FOOT EXAM  10/02/2024   Medicare Annual Wellness (AWV)  08/28/2025   Pneumococcal Vaccine: 50+ Years  Completed   Hepatitis C Screening  Completed   Meningococcal B Vaccine  Aged Out    Health Maintenance Health Maintenance Due  Topic Date Due   DTaP/Tdap/Td (1 - Tdap) Never done   Zoster Vaccines- Shingrix (1 of 2) Never done   OPHTHALMOLOGY EXAM  10/27/2022   COVID-19 Vaccine (1 - 2025-26 season) Never done   Colonoscopy  08/02/2024   HEMOGLOBIN A1C  08/05/2024   Health Maintenance Items Addressed: Referral sent to GI for colonoscopy  Additional Screening:  Vision Screening: Recommended annual ophthalmology exams for early detection of glaucoma and other disorders of the eye. A list of eye care providers were provided to the patient  Dental Screening: Recommended annual dental exams for proper oral hygiene  Community Resource Referral / Chronic Care Management: CRR required this visit?  No   CCM required this visit?  No   Plan:    I have personally reviewed and noted the following in the patient's chart:   Medical and social history Use of alcohol, tobacco or illicit drugs  Current medications and supplements including opioid prescriptions. Patient is currently taking opioid prescriptions. Information provided to patient regarding non-opioid alternatives. Patient advised to discuss non-opioid treatment plan with their provider. Functional ability and status Nutritional status Physical activity Advanced directives List of other physicians Hospitalizations, surgeries, and ER visits in previous  12 months Vitals Screenings to include cognitive, depression, and falls Referrals and appointments  In addition, I have reviewed and discussed with patient certain preventive protocols, quality metrics, and best practice recommendations. A written personalized care plan for preventive services as well as general preventive health recommendations were provided to patient.   Timm Bonenberger, CMA   08/28/2024   After Visit Summary: (Mail) Due to this being a telephonic visit, the after visit summary with patients personalized plan was offered to patient via mail   Notes: Nothing significant to report at this time.

## 2024-09-01 ENCOUNTER — Encounter (INDEPENDENT_AMBULATORY_CARE_PROVIDER_SITE_OTHER): Payer: Self-pay | Admitting: *Deleted

## 2024-09-02 ENCOUNTER — Telehealth: Payer: Self-pay

## 2024-09-02 NOTE — Telephone Encounter (Signed)
 Copied from CRM #[id number]. Topic: General - Other >> Sep 02, 2024 11:10 AM Hadassah PARAS wrote: Reason for CRM: [facility] will be following pt for pallaitive care servives. Any questions or concerns please contact Tonya from [facility] [phone]

## 2024-09-11 ENCOUNTER — Telehealth: Payer: Self-pay | Admitting: Internal Medicine

## 2024-09-11 NOTE — Telephone Encounter (Signed)
 FL2 form Copied Noted Sleeved Original placed provider box Mathias) Copy placed front desk folder  Call Margarie Dawn at [phone] when ready for pick up

## 2024-09-12 NOTE — Telephone Encounter (Signed)
 Kasey picked up the form

## 2024-10-10 ENCOUNTER — Other Ambulatory Visit: Payer: Self-pay | Admitting: Internal Medicine

## 2024-10-10 DIAGNOSIS — G609 Hereditary and idiopathic neuropathy, unspecified: Secondary | ICD-10-CM

## 2024-10-29 ENCOUNTER — Encounter: Payer: Self-pay | Admitting: Internal Medicine

## 2024-10-29 ENCOUNTER — Ambulatory Visit: Admitting: Internal Medicine

## 2024-10-29 VITALS — BP 132/74 | HR 78 | Ht 72.0 in | Wt 191.6 lb

## 2024-10-29 DIAGNOSIS — M71329 Other bursal cyst, unspecified elbow: Secondary | ICD-10-CM | POA: Diagnosis not present

## 2024-10-29 DIAGNOSIS — I5032 Chronic diastolic (congestive) heart failure: Secondary | ICD-10-CM | POA: Diagnosis not present

## 2024-10-29 DIAGNOSIS — I130 Hypertensive heart and chronic kidney disease with heart failure and stage 1 through stage 4 chronic kidney disease, or unspecified chronic kidney disease: Secondary | ICD-10-CM

## 2024-10-29 DIAGNOSIS — R7303 Prediabetes: Secondary | ICD-10-CM

## 2024-10-29 DIAGNOSIS — I10 Essential (primary) hypertension: Secondary | ICD-10-CM

## 2024-10-29 DIAGNOSIS — N186 End stage renal disease: Secondary | ICD-10-CM | POA: Diagnosis not present

## 2024-10-29 DIAGNOSIS — K529 Noninfective gastroenteritis and colitis, unspecified: Secondary | ICD-10-CM

## 2024-10-29 DIAGNOSIS — H60543 Acute eczematoid otitis externa, bilateral: Secondary | ICD-10-CM | POA: Diagnosis not present

## 2024-10-29 DIAGNOSIS — Z992 Dependence on renal dialysis: Secondary | ICD-10-CM | POA: Diagnosis not present

## 2024-10-29 MED ORDER — FLUOCINOLONE ACETONIDE 0.01 % OT OIL: 0.5000 mL | TOPICAL_OIL | Freq: Every day | OTIC | 0 refills | Status: AC

## 2024-10-29 MED ORDER — DIPHENOXYLATE-ATROPINE 2.5-0.025 MG PO TABS: 1.0000 | ORAL_TABLET | Freq: Two times a day (BID) | ORAL | 0 refills | Status: AC | PRN

## 2024-10-29 NOTE — Assessment & Plan Note (Addendum)
 Has had leg swelling and pulmonary vascular congestion due to missed HD previously Has minimal urine output Was on Coreg , had to be discontinued due to hypotensive spells Advised to comply with HD sessions

## 2024-10-29 NOTE — Assessment & Plan Note (Signed)
 HbA1C: 5.1 now Has h/o DM in the past, but has been prediabetic range since starting HD Continue to follow low carb diet

## 2024-10-29 NOTE — Progress Notes (Signed)
 "  Established Patient Office Visit  Subjective:  Patient ID: Charles [name], male    DOB: 1956/07/07  Age: 68 y.o. MRN: [id number]  CC:  Chief Complaint  Patient presents with   Follow-up    Follow up   Elbow cyst    Unsure what happened, has a cyst like bump on his right elbow.   Diarrhea    Reports ongoing diarrhea for the past few months.    HPI Charles [name] is a 67 y.o. male with past medical history of HTN, ESRD on HD, BPH, anemia of chronic disease, prediabetes, chronic constipation and tobacco abuse who presents for f/u of his chronic medical conditions.  ESRD: He is on HD (TThSa) now.  He is still noncompliant to his medications.  He still has urine output. He has Sevelamer , but reports chronic diarrhea with it for the last 6 months. Denies melena or hematochezia.  HTN: His blood pressure was wnl today.  He has stopped nifedipine  and Coreg  now as advised.  He denies any headache, chest pain or dyspnea currently.  Peripheral neuropathy: He still takes gabapentin  300 mg BID to help with neuropathic pain and back pain.  He has a cyst over his right forearm near elbow, which he noticed 2 weeks ago.  He does not recall any recent injury.  Denies any local pain, but wants it to be removed.  Past Medical History:  Diagnosis Date   Anemia    Anxiety    Chronic back pain    Dementia    Nursing facility feels he has Dementia   Diabetes mellitus    Pt reports no longer has   Facial numbness    Fracture of right foot    Hepatitis    B and C   Hypertension    Mental disorder    schizophrenia   Renal insufficiency    Dialysis T, TH, Sat    Past Surgical History:  Procedure Laterality Date   AV FISTULA PLACEMENT Left 12/20/2021   Procedure: LEFT ARM ARTERIOVENOUS (AV) FISTULA CREATION;  Surgeon: Oris Krystal FALCON, MD;  Location: [facility];  Service: Vascular;  Laterality: Left;   AV FISTULA PLACEMENT Left 03/13/2023   Procedure: INSERTION OF LEFT ARM ARTERIOVENOUS (AV)  GORE-TEX GRAFT;  Surgeon: Oris Krystal FALCON, MD;  Location: [facility];  Service: Vascular;  Laterality: Left;   BIOPSY  08/02/2021   Procedure: BIOPSY;  Surgeon: Eartha Angelia Sieving, MD;  Location: [facility];  Service: Gastroenterology;;  duodenal and gastric biopsies   CARPAL TUNNEL RELEASE Right 06/03/2024   Procedure: CARPAL TUNNEL RELEASE;  Surgeon: Arlinda Buster, MD;  Location: [facility];  Service: Orthopedics;  Laterality: Right;  RIGHT OPEN CARPAL TUNNEL RELEASE, RIGHT HAND MASS EXCISION   COLONOSCOPY N/A 04/11/2018   Procedure: COLONOSCOPY;  Surgeon: Golda Claudis PENNER, MD;  Location: [facility];  Service: Endoscopy;  Laterality: N/A;  10:30   COLONOSCOPY WITH PROPOFOL  N/A 07/05/2018   Procedure: COLONOSCOPY WITH PROPOFOL ;  Surgeon: Golda Claudis PENNER, MD;  Location: [facility];  Service: Endoscopy;  Laterality: N/A;  10:35   COLONOSCOPY WITH PROPOFOL  N/A 08/02/2021   Procedure: COLONOSCOPY WITH PROPOFOL ;  Surgeon: Eartha Angelia Sieving, MD;  Location: [facility];  Service: Gastroenterology;  Laterality: N/A;  9:05   CYST REMOVAL HAND Right 06/03/2024   Procedure: REMOVAL, CYST, HAND;  Surgeon: Arlinda Buster, MD;  Location: [facility];  Service: Orthopedics;  Laterality: Right;   ESOPHAGOGASTRODUODENOSCOPY (EGD) WITH PROPOFOL  N/A 08/02/2021  Procedure: ESOPHAGOGASTRODUODENOSCOPY (EGD) WITH PROPOFOL ;  Surgeon: Eartha Angelia Sieving, MD;  Location: [facility];  Service: Gastroenterology;  Laterality: N/A;   left 1st toe     ORIF TOE FRACTURE  09/05/2011   Procedure: OPEN REDUCTION INTERNAL FIXATION (ORIF) METATARSAL (TOE) FRACTURE;  Surgeon: Deward DELENA Schwartz;  Location: [facility];  Service: Orthopedics;  Laterality: Left;  reconstruction left great toe FHB plantar plate   PERIPHERAL VASCULAR BALLOON ANGIOPLASTY  11/02/2023   Procedure: PERIPHERAL VASCULAR BALLOON ANGIOPLASTY;  Surgeon: Melia Lynwood ORN, MD;  Location: [facility];  Service: Cardiovascular;;   PERIPHERAL  VASCULAR THROMBECTOMY N/A 11/02/2023   Procedure: PERIPHERAL VASCULAR THROMBECTOMY;  Surgeon: Melia Lynwood ORN, MD;  [facility];  Service: Cardiovascular;  Laterality: N/A;   POLYPECTOMY  04/11/2018   Procedure: POLYPECTOMY;  Surgeon: Golda Claudis PENNER, MD;  Location: [facility];  Service: Endoscopy;;  colon   POLYPECTOMY  07/05/2018   Procedure: POLYPECTOMY;  Surgeon: Golda Claudis PENNER, MD;  Location: [facility];  Service: Endoscopy;;  colon   POLYPECTOMY  08/02/2021   Procedure: POLYPECTOMY;  Surgeon: Eartha Angelia, Sieving, MD;  Location: [facility];  Service: Gastroenterology;;  transverse colon polyp x 1 ascending colon polyp   right foot suger      Family History  Problem Relation Age of Onset   Diabetes Brother    Colon cancer Neg Hx    Liver disease Neg Hx        unknown for sure, mom died at age 65    Social History   Socioeconomic History   Marital status: Single    Spouse name: Not on file   Number of children: Not on file   Years of education: Not on file   Highest education level: Not on file  Occupational History   Not on file  Tobacco Use   Smoking status: Every Day    Current packs/day: 0.25    Average packs/day: 0.3 packs/day for 40.0 years (10.0 ttl pk-yrs)    Types: Cigarettes   Smokeless tobacco: Never   Tobacco comments:    Smoke   Vaping Use   Vaping status: Never Used  Substance and Sexual Activity   Alcohol use: Yes    Alcohol/week: 6.0 standard drinks of alcohol    Types: 6 Cans of beer per week    Comment: every other day   Drug use: No   Sexual activity: Not on file  Other Topics Concern   Not on file  Social History Narrative   Not on file   Social Drivers of Health   Tobacco Use: High Risk (10/29/2024)   Patient History    Smoking Tobacco Use: Every Day    Smokeless Tobacco Use: Never    Passive Exposure: Not on file  Financial Resource Strain: Low Risk (08/28/2024)   Overall Financial Resource Strain (CARDIA)     Difficulty of Paying Living Expenses: Not hard at all  Food Insecurity: No Food Insecurity (08/28/2024)   Epic    Worried About Programme Researcher, Broadcasting/film/video in the Last Year: Never true    Ran Out of Food in the Last Year: Never true  Transportation Needs: No Transportation Needs (08/28/2024)   Epic    Lack of Transportation (Medical): No    Lack of Transportation (Non-Medical): No  Physical Activity: Inactive (08/28/2024)   Exercise Vital Sign    Days of Exercise per Week: 0 days    Minutes of Exercise  per Session: 0 min  Stress: No Stress Concern Present (08/28/2024)   [employer] of Occupational Health - Occupational Stress Questionnaire    Feeling of Stress: Not at all  Social Connections: Patient Declined (08/28/2024)   Social Connection and Isolation Panel    Frequency of Communication with Friends and Family: Patient declined    Frequency of Social Gatherings with Friends and Family: Patient declined    Attends Religious Services: Patient declined    Active Member of Clubs or Organizations: Patient declined    Attends Banker Meetings: Patient declined    Marital Status: Patient declined  Intimate Partner Violence: Not At Risk (08/28/2024)   Epic    Fear of Current or Ex-Partner: No    Emotionally Abused: No    Physically Abused: No    Sexually Abused: No  Depression (PHQ2-9): Low Risk (10/29/2024)   Depression (PHQ2-9)    PHQ-2 Score: 0  Alcohol Screen: Low Risk (08/28/2024)   Alcohol Screen    Last Alcohol Screening Score (AUDIT): 0  Housing: Low Risk (08/28/2024)   Epic    Unable to Pay for Housing in the Last Year: No    Number of Times Moved in the Last Year: 0    Homeless in the Last Year: No  Utilities: Not At Risk (08/28/2024)   Epic    Threatened with loss of utilities: No  Health Literacy: Adequate Health Literacy (08/28/2024)   B1300 Health Literacy    Frequency of need for help with medical instructions: Never    Outpatient Medications  Prior to Visit  Medication Sig Dispense Refill   acetaminophen  (TYLENOL ) 500 MG tablet Take 500 mg by mouth every 6 (six) hours as needed for mild pain (pain score 1-3).     aspirin  EC 81 MG tablet Take 1 tablet (81 mg total) by mouth daily with breakfast. 100 tablet 3   Cholecalciferol  25 MCG (1000 UT) capsule Take 5 capsules (5,000 Units total) by mouth daily. 30 capsule 6   folic acid  (FOLVITE ) 1 MG tablet Take 1 tablet (1 mg total) by mouth daily. 30 tablet 5   gabapentin  (NEURONTIN ) 300 MG capsule Take 1 capsule (300 mg total) by mouth 2 (two) times daily. 180 capsule 1   lidocaine -prilocaine (EMLA) cream Apply 1 Application topically once.     magnesium gluconate (MAGONATE) 500 (27 Mg) MG TABS tablet Take 500 mg by mouth in the morning and at bedtime.     nicotine  (NICODERM CQ  - DOSED IN MG/24 HOURS) 14 mg/24hr patch Place 1 patch (14 mg total) onto the skin daily. 28 patch 0   omeprazole  (PRILOSEC) 20 MG capsule Take 1 capsule (20 mg total) by mouth daily. 30 capsule 5   sevelamer  carbonate (RENVELA ) 800 MG tablet Take 1 tablet (800 mg total) by mouth 3 (three) times daily with meals. 90 tablet 5   Tenapanor  HCl, CKD, (XPHOZAH ) 30 MG TABS Take 30 mg by mouth daily. 30 tablet 1   traMADol (ULTRAM) 50 MG tablet Take 50 mg by mouth every 8 (eight) hours as needed.     No facility-administered medications prior to visit.    No Known Allergies  ROS Review of Systems  Constitutional:  Positive for fatigue. Negative for chills and fever.  HENT:  Negative for congestion and sore throat.   Eyes:  Negative for pain and discharge.  Respiratory:  Negative for cough and shortness of breath.   Cardiovascular:  Negative for chest pain and palpitations.  Gastrointestinal:  Positive for constipation. Negative for diarrhea, nausea and vomiting.  Endocrine: Negative for polydipsia and polyuria.  Genitourinary:  Negative for dysuria and hematuria.  Musculoskeletal:  Positive for arthralgias.  Negative for neck pain and neck stiffness.  Skin:  Negative for rash.  Neurological:  Positive for weakness (LE). Negative for dizziness and numbness.  Psychiatric/Behavioral:  Positive for sleep disturbance. Negative for agitation and behavioral problems.       Objective:    Physical Exam Vitals reviewed.  Constitutional:      General: He is not in acute distress.    Appearance: He is not diaphoretic.  HENT:     Head: Normocephalic and atraumatic.     Ears:     Comments: Dry external ear canal    Nose: Nose normal.     Mouth/Throat:     Mouth: Mucous membranes are moist.  Eyes:     General: No scleral icterus.    Extraocular Movements: Extraocular movements intact.  Cardiovascular:     Rate and Rhythm: Normal rate and regular rhythm.     Heart sounds: Normal heart sounds. No murmur heard. Pulmonary:     Breath sounds: Normal breath sounds. No wheezing or rales.  Musculoskeletal:     Cervical back: Neck supple. No tenderness.     Right lower leg: No edema.     Left lower leg: No edema.     Comments: Has a cyst like structure over dorsal aspect of dorsal aspect of forearm, near elbow, about 3 cm in diameter  Skin:    General: Skin is warm.     Findings: No rash.     Comments: AV fistula in left UE  Neurological:     General: No focal deficit present.     Mental Status: He is oriented to person, place, and time.  Psychiatric:        Mood and Affect: Mood normal. Affect is flat.     BP 132/74   Pulse 78   Ht 6' (1.829 m)   Wt 191 lb 9.6 oz (86.9 kg)   SpO2 93%   BMI 25.99 kg/m  Wt Readings from Last 3 Encounters:  10/29/24 191 lb 9.6 oz (86.9 kg)  08/28/24 200 lb 9.9 oz (91 kg)  06/27/24 197 lb 6.4 oz (89.5 kg)     Lab Results  Component Value Date   WBC 5.2 06/21/2024   HGB 9.9 (L) 06/21/2024   HCT 30.9 (L) 06/21/2024   MCV 102.7 (H) 06/21/2024   PLT 256 06/21/2024   Lab Results  Component Value Date   NA 136 06/21/2024   K 4.9 06/21/2024   CO2 33  (H) 06/21/2024   GLUCOSE 107 (H) 06/21/2024   BUN 30 (H) 06/21/2024   CREATININE 10.39 (H) 06/21/2024   BILITOT 0.8 06/21/2024   ALKPHOS 52 06/21/2024   AST 16 06/21/2024   ALT 9 06/21/2024   PROT 7.8 06/21/2024   ALBUMIN  3.9 06/21/2024   CALCIUM  9.8 06/21/2024   ANIONGAP 15 06/21/2024   EGFR 11 (L) 10/09/2022   Lab Results  Component Value Date   CHOL 179 08/23/2022   Lab Results  Component Value Date   HDL 62 08/23/2022   Lab Results  Component Value Date   LDLCALC 91 08/23/2022   Lab Results  Component Value Date   TRIG 129 08/23/2022   Lab Results  Component Value Date   CHOLHDL 2.9 08/23/2022   Lab Results  Component Value Date   HGBA1C 5.0 02/04/2024  Assessment & Plan:   Problem List Items Addressed This Visit       Cardiovascular and Mediastinum   Essential hypertension - Primary (Chronic)   BP Readings from Last 1 Encounters:  10/29/24 132/74   Well-controlled now Followed by nephrology Was on Coreg  25 mg twice daily and nifedipine  60 mg daily -recently discontinued due to episodes of hypotension Advised to follow renal diet as suggested by HD center and ambulate as tolerated      Chronic diastolic (congestive) heart failure (HCC)   Has had leg swelling and pulmonary vascular congestion due to missed HD previously Has minimal urine output Was on Coreg , had to be discontinued due to hypotensive spells Advised to comply with HD sessions        Digestive   Chronic diarrhea   Unclear if related to several annular Would prefer to rule out infectious etiology-check stool GI profile He has tried Imodium with mild relief, Lomotil prescribed Advised to maintain adequate hydration      Relevant Medications   diphenoxylate-atropine  (LOMOTIL) 2.5-0.025 MG tablet   Other Relevant Orders   GI Profile, Stool, PCR     Nervous and Auditory   Eczema of both external ears   Has itching and dryness over bilateral ears likely due to  eczema Fluocinolone oil prescribed Advised to avoid scratching or using any sharp objects for cleaning purposes      Relevant Medications   Fluocinolone Acetonide 0.01 % OIL     Musculoskeletal and Integument   Synovial cyst of elbow   Has a cystlike structure near right elbow, on the dorsal surface Referred to orthopedic surgery for further evaluation      Relevant Orders   Ambulatory referral to Orthopedic Surgery     Genitourinary   ESRD (end stage renal disease) on dialysis (HCC)   Now on HD (TThSa) Needs to be compliant to his HD sessions and medications        Other   Prediabetes   HbA1C: 5.1 now Has h/o DM in the past, but has been prediabetic range since starting HD Continue to follow low carb diet      Relevant Orders   Bayer DCA Hb A1c Waived     Meds ordered this encounter  Medications   diphenoxylate-atropine  (LOMOTIL) 2.5-0.025 MG tablet    Sig: Take 1 tablet by mouth 2 (two) times daily as needed for diarrhea or loose stools.    Dispense:  30 tablet    Refill:  0   Fluocinolone Acetonide 0.01 % OIL    Sig: Place 0.5 mLs in ear(s) daily. Bilateral ears.    Dispense:  20 mL    Refill:  0    Follow-up: Return in about 4 months (around 02/26/2025).    Suzzane MARLA Blanch, MD "

## 2024-10-29 NOTE — Assessment & Plan Note (Addendum)
 BP Readings from Last 1 Encounters:  10/29/24 132/74   Well-controlled now Followed by nephrology Was on Coreg  25 mg twice daily and nifedipine  60 mg daily -recently discontinued due to episodes of hypotension Advised to follow renal diet as suggested by HD center and ambulate as tolerated

## 2024-10-29 NOTE — Assessment & Plan Note (Signed)
 Now on HD (TThSa) Needs to be compliant to his HD sessions and medications

## 2024-10-29 NOTE — Assessment & Plan Note (Signed)
 Has a cystlike structure near right elbow, on the dorsal surface Referred to orthopedic surgery for further evaluation

## 2024-10-29 NOTE — Assessment & Plan Note (Signed)
 Has itching and dryness over bilateral ears likely due to eczema Fluocinolone oil prescribed Advised to avoid scratching or using any sharp objects for cleaning purposes

## 2024-10-29 NOTE — Patient Instructions (Signed)
 Please apply Fluocinolone oil for ear itching.  Please take Lomotil as needed for diarrhea.  Please get stool test done as discussed.

## 2024-10-29 NOTE — Assessment & Plan Note (Signed)
 Unclear if related to several annular Would prefer to rule out infectious etiology-check stool GI profile He has tried Imodium with mild relief, Lomotil prescribed Advised to maintain adequate hydration

## 2024-10-30 LAB — BAYER DCA HB A1C WAIVED: HB A1C (BAYER DCA - WAIVED): 5.1 % (ref 4.8–5.6)

## 2024-11-02 LAB — GI PROFILE, STOOL, PCR

## 2024-11-03 ENCOUNTER — Ambulatory Visit: Payer: Self-pay | Admitting: Internal Medicine

## 2024-11-19 ENCOUNTER — Ambulatory Visit (INDEPENDENT_AMBULATORY_CARE_PROVIDER_SITE_OTHER)

## 2024-11-19 ENCOUNTER — Ambulatory Visit (INDEPENDENT_AMBULATORY_CARE_PROVIDER_SITE_OTHER): Admitting: Orthopedic Surgery

## 2024-11-19 ENCOUNTER — Encounter: Payer: Self-pay | Admitting: Orthopedic Surgery

## 2024-11-19 VITALS — BP 132/74 | Ht 72.0 in | Wt 191.0 lb

## 2024-11-19 DIAGNOSIS — T148XXA Other injury of unspecified body region, initial encounter: Secondary | ICD-10-CM

## 2024-11-19 DIAGNOSIS — M7989 Other specified soft tissue disorders: Secondary | ICD-10-CM

## 2024-11-19 NOTE — Progress Notes (Signed)
 "  Office Visit Note   Patient: Charles [name]           Date of Birth: 1956/08/19           MRN: [id number] Visit Date: 11/19/2024 Requested by: [name] [name], MD [address] [place],  [place] [address] PCP: [name] [name], MD   Assessment & Plan:   Encounter Diagnoses  Name Primary?   Mass of soft tissue of elbow Yes   Hematoma     No orders of the defined types were placed in this encounter.   Aspiration hematoma right forearm  Pressure dressing for 2 days  Recheck in 2 weeks  Procedure note Right forearm mass aspiration The patient gave consent for aspiration Site confirmed Alcohol prep Ethyl chloride anesthesia 18-gauge needle used to aspirate 2-1/2 cc of bloody fluid No complications    Subjective: Chief Complaint  Patient presents with   Elbow Problem    Right / area of concern ? Cyst / referred by Dr Tobie    HPI: 69 year old male presents with a mass over the right forearm just distal to the olecranon reports no trauma present for approximately 1 month              ROS: No history of infection or trauma   Images personally read and my interpretation :  DG Elbow 2 Views Right Result Date: 11/19/2024 Image report right elbow mass Soft tissue swelling seen over the forearm no calcification, no evidence of spur on the olecranon joint looks normal.  Soft tissue mass unclear etiology     Visit Diagnoses:  1. Mass of soft tissue of elbow   2. Hematoma      Follow-Up Instructions: Return in about 2 weeks (around 12/03/2024) for FOLLOW UP, RIGHT forearm mass.    Objective: Vital Signs: BP 132/74 Comment: 10/29/24  Ht 6' (1.829 m)   Wt 191 lb (86.6 kg)   BMI 25.90 kg/m   Physical Exam Vitals and nursing note reviewed.  Constitutional:      Appearance: Normal appearance.  HENT:     Head: Normocephalic and atraumatic.  Eyes:     General: No scleral icterus.       Right eye: No discharge.        Left eye: No discharge.     Extraocular  Movements: Extraocular movements intact.     Conjunctiva/sclera: Conjunctivae normal.     Pupils: Pupils are equal, round, and reactive to light.  Cardiovascular:     Rate and Rhythm: Normal rate.     Pulses: Normal pulses.  Skin:    General: Skin is warm and dry.     Capillary Refill: Capillary refill takes less than 2 seconds.  Neurological:     General: No focal deficit present.     Mental Status: He is alert and oriented to person, place, and time.  Psychiatric:        Mood and Affect: Mood normal.        Behavior: Behavior normal.        Thought Content: Thought content normal.        Judgment: Judgment normal.      Right Elbow Exam   Comments:  Right elbow 3 cm soft nontender mass just distal to the olecranon on the forearm on the ulnar side  Normal range of motion of the elbow no instability  Skin looks otherwise normal  Olecranon tenderness       Specialty Comments:  No  specialty comments available.  Imaging: DG Elbow 2 Views Right Result Date: 11/19/2024 Image report right elbow mass Soft tissue swelling seen over the forearm no calcification, no evidence of spur on the olecranon joint looks normal.  Soft tissue mass unclear etiology     PMFS History: Patient Active Problem List   Diagnosis Date Noted   Eczema of both external ears 10/29/2024   Synovial cyst of elbow 10/29/2024   Anemia of chronic renal failure, stage 5 (HCC) 06/27/2024   Acute hyperkalemia 06/07/2024   Carpal tunnel syndrome, right upper limb 06/03/2024   Mass of right hand 06/03/2024   Synovial cyst of hand 10/03/2023   Encounter for general adult medical examination with abnormal findings 10/03/2023   ESRD (end stage renal disease) on dialysis (HCC) 10/03/2023   Coagulation defect, unspecified 06/21/2023   Chronic diarrhea 05/17/2023   Hypothyroidism, unspecified 05/17/2023   Pruritus, unspecified 05/17/2023   Chronic diastolic (congestive) heart failure (HCC) 05/15/2023    Type 2 diabetes mellitus with diabetic chronic kidney disease (HCC) 05/15/2023   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 5 chronic kidney disease, or end stage renal disease (HCC) 05/15/2023   Secondary hyperparathyroidism of renal origin 05/15/2023   Urethral discharge 01/25/2023   Drowsiness 10/09/2022   Gastroesophageal reflux disease    Non-cardiac chest pain 08/22/2022   Hallux valgus, right 04/13/2022   Essential hypertension 10/18/2021   HLD (hyperlipidemia) 10/18/2021   Prediabetes 10/18/2021   BPH (benign prostatic hyperplasia) 10/18/2021   Idiopathic peripheral neuropathy 10/18/2021   Constipation 10/18/2021   Nicotine  abuse 10/18/2021   History of colonic polyps 06/14/2018   Past Medical History:  Diagnosis Date   Anemia    Anxiety    Chronic back pain    Dementia    Nursing facility feels he has Dementia   Diabetes mellitus    Pt reports no longer has   Facial numbness    Fracture of right foot    Hepatitis    B and C   Hypertension    Mental disorder    schizophrenia   Renal insufficiency    Dialysis T, TH, Sat    Family History  Problem Relation Age of Onset   Diabetes Brother    Colon cancer Neg Hx    Liver disease Neg Hx        unknown for sure, mom died at age 49    Past Surgical History:  Procedure Laterality Date   AV FISTULA PLACEMENT Left 12/20/2021   Procedure: LEFT ARM ARTERIOVENOUS (AV) FISTULA CREATION;  Surgeon: Oris Krystal FALCON, MD;  Location: [facility];  Service: Vascular;  Laterality: Left;   AV FISTULA PLACEMENT Left 03/13/2023   Procedure: INSERTION OF LEFT ARM ARTERIOVENOUS (AV) GORE-TEX GRAFT;  Surgeon: Oris Krystal FALCON, MD;  Location: [facility];  Service: Vascular;  Laterality: Left;   BIOPSY  08/02/2021   Procedure: BIOPSY;  Surgeon: Eartha Angelia Sieving, MD;  Location: [facility];  Service: Gastroenterology;;  duodenal and gastric biopsies   CARPAL TUNNEL RELEASE Right 06/03/2024   Procedure:  CARPAL TUNNEL RELEASE;  Surgeon: Arlinda Buster, MD;  Location: [facility];  Service: Orthopedics;  Laterality: Right;  RIGHT OPEN CARPAL TUNNEL RELEASE, RIGHT HAND MASS EXCISION   COLONOSCOPY N/A 04/11/2018   Procedure: COLONOSCOPY;  Surgeon: Golda Claudis PENNER, MD;  Location: [facility];  Service: Endoscopy;  Laterality: N/A;  10:30   COLONOSCOPY WITH PROPOFOL  N/A 07/05/2018   Procedure: COLONOSCOPY WITH PROPOFOL ;  Surgeon: Golda Claudis PENNER, MD;  Location: [facility];  Service: Endoscopy;  Laterality: N/A;  10:35   COLONOSCOPY WITH PROPOFOL  N/A 08/02/2021   Procedure: COLONOSCOPY WITH PROPOFOL ;  Surgeon: Eartha Angelia Sieving, MD;  Location: [facility];  Service: Gastroenterology;  Laterality: N/A;  9:05   CYST REMOVAL HAND Right 06/03/2024   Procedure: REMOVAL, CYST, HAND;  Surgeon: Arlinda Buster, MD;  Location: [facility];  Service: Orthopedics;  Laterality: Right;   ESOPHAGOGASTRODUODENOSCOPY (EGD) WITH PROPOFOL  N/A 08/02/2021   Procedure: ESOPHAGOGASTRODUODENOSCOPY (EGD) WITH PROPOFOL ;  Surgeon: Eartha Angelia Sieving, MD;  Location: [facility];  Service: Gastroenterology;  Laterality: N/A;   left 1st toe     ORIF TOE FRACTURE  09/05/2011   Procedure: OPEN REDUCTION INTERNAL FIXATION (ORIF) METATARSAL (TOE) FRACTURE;  Surgeon: Deward DELENA Schwartz;  Location: [facility];  Service: Orthopedics;  Laterality: Left;  reconstruction left great toe FHB plantar plate   PERIPHERAL VASCULAR BALLOON ANGIOPLASTY  11/02/2023   Procedure: PERIPHERAL VASCULAR BALLOON ANGIOPLASTY;  Surgeon: Melia Lynwood ORN, MD;  Location: [facility];  Service: Cardiovascular;;   PERIPHERAL VASCULAR THROMBECTOMY N/A 11/02/2023   Procedure: PERIPHERAL VASCULAR THROMBECTOMY;  Surgeon: Melia Lynwood ORN, MD;  Location: [facility];  Service: Cardiovascular;  Laterality: N/A;   POLYPECTOMY  04/11/2018   Procedure: POLYPECTOMY;  Surgeon: Golda Claudis PENNER, MD;  [facility];  Service: Endoscopy;;  colon    POLYPECTOMY  07/05/2018   Procedure: POLYPECTOMY;  Surgeon: Golda Claudis PENNER, MD;  [facility];  Service: Endoscopy;;  colon   POLYPECTOMY  08/02/2021   Procedure: POLYPECTOMY;  Surgeon: Eartha Angelia, Sieving, MD;  Location: [facility];  Service: Gastroenterology;;  transverse colon polyp x 1 ascending colon polyp   right foot suger     Social History   Occupational History   Not on file  Tobacco Use   Smoking status: Every Day    Current packs/day: 0.25    Average packs/day: 0.3 packs/day for 40.0 years (10.0 ttl pk-yrs)    Types: Cigarettes   Smokeless tobacco: Never   Tobacco comments:    Smoke   Vaping Use   Vaping status: Never Used  Substance and Sexual Activity   Alcohol use: Yes    Alcohol/week: 6.0 standard drinks of alcohol    Types: 6 Cans of beer per week    Comment: every other day   Drug use: No   Sexual activity: Not on file       "

## 2024-11-19 NOTE — Progress Notes (Signed)
" °  Intake history:  Chief Complaint  Patient presents with   Elbow Problem    Right / area of concern ? cyst     BP 132/74 Comment: 10/29/24  Ht 6' (1.829 m)   Wt 191 lb (86.6 kg)   BMI 25.90 kg/m  Body mass index is 25.9 kg/m.  Pharmacy? _[facility]___________________________________  WHAT ARE WE SEEING YOU FOR TODAY?   Right elbow /soft tissue mass not painful   How long has this bothered you? (DOI?DOS?WS?)  approximately 1 month(s) ago  Was there an injury? No  Anticoag.  No   Any ALLERGIES _Allergies[1] __________   No treatment or meds for this problem its not painful       [1] No Known Allergies  "

## 2024-11-28 ENCOUNTER — Encounter (HOSPITAL_COMMUNITY): Payer: Self-pay

## 2024-11-28 ENCOUNTER — Emergency Department (HOSPITAL_COMMUNITY)

## 2024-11-28 ENCOUNTER — Other Ambulatory Visit: Payer: Self-pay

## 2024-11-28 ENCOUNTER — Observation Stay (HOSPITAL_COMMUNITY)
Admission: EM | Admit: 2024-11-28 | Discharge: 2024-12-01 | Disposition: A | Attending: Family Medicine | Admitting: Family Medicine

## 2024-11-28 DIAGNOSIS — F17210 Nicotine dependence, cigarettes, uncomplicated: Secondary | ICD-10-CM | POA: Insufficient documentation

## 2024-11-28 DIAGNOSIS — Z992 Dependence on renal dialysis: Secondary | ICD-10-CM | POA: Insufficient documentation

## 2024-11-28 DIAGNOSIS — E860 Dehydration: Secondary | ICD-10-CM | POA: Insufficient documentation

## 2024-11-28 DIAGNOSIS — G609 Hereditary and idiopathic neuropathy, unspecified: Secondary | ICD-10-CM | POA: Diagnosis present

## 2024-11-28 DIAGNOSIS — I132 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 5 chronic kidney disease, or end stage renal disease: Secondary | ICD-10-CM | POA: Insufficient documentation

## 2024-11-28 DIAGNOSIS — K219 Gastro-esophageal reflux disease without esophagitis: Secondary | ICD-10-CM | POA: Diagnosis present

## 2024-11-28 DIAGNOSIS — M25551 Pain in right hip: Secondary | ICD-10-CM | POA: Insufficient documentation

## 2024-11-28 DIAGNOSIS — E785 Hyperlipidemia, unspecified: Secondary | ICD-10-CM | POA: Diagnosis present

## 2024-11-28 DIAGNOSIS — K529 Noninfective gastroenteritis and colitis, unspecified: Secondary | ICD-10-CM

## 2024-11-28 DIAGNOSIS — I5032 Chronic diastolic (congestive) heart failure: Secondary | ICD-10-CM | POA: Diagnosis present

## 2024-11-28 DIAGNOSIS — Z79899 Other long term (current) drug therapy: Secondary | ICD-10-CM | POA: Insufficient documentation

## 2024-11-28 DIAGNOSIS — Z452 Encounter for adjustment and management of vascular access device: Secondary | ICD-10-CM | POA: Insufficient documentation

## 2024-11-28 DIAGNOSIS — N186 End stage renal disease: Secondary | ICD-10-CM | POA: Insufficient documentation

## 2024-11-28 DIAGNOSIS — Z7982 Long term (current) use of aspirin: Secondary | ICD-10-CM | POA: Insufficient documentation

## 2024-11-28 DIAGNOSIS — G9009 Other idiopathic peripheral autonomic neuropathy: Secondary | ICD-10-CM | POA: Insufficient documentation

## 2024-11-28 DIAGNOSIS — E875 Hyperkalemia: Principal | ICD-10-CM | POA: Diagnosis present

## 2024-11-28 DIAGNOSIS — B9681 Helicobacter pylori [H. pylori] as the cause of diseases classified elsewhere: Secondary | ICD-10-CM

## 2024-11-28 DIAGNOSIS — I10 Essential (primary) hypertension: Secondary | ICD-10-CM | POA: Diagnosis present

## 2024-11-28 DIAGNOSIS — D631 Anemia in chronic kidney disease: Secondary | ICD-10-CM | POA: Diagnosis present

## 2024-11-28 DIAGNOSIS — J9601 Acute respiratory failure with hypoxia: Secondary | ICD-10-CM | POA: Insufficient documentation

## 2024-11-28 DIAGNOSIS — E8779 Other fluid overload: Principal | ICD-10-CM | POA: Insufficient documentation

## 2024-11-28 HISTORY — DX: Dependence on renal dialysis: Z99.2

## 2024-11-28 LAB — CBC WITH DIFFERENTIAL/PLATELET
Abs Immature Granulocytes: 0.03 10*3/uL (ref 0.00–0.07)
Basophils Absolute: 0 10*3/uL (ref 0.0–0.1)
Basophils Relative: 0 %
Eosinophils Absolute: 0 10*3/uL (ref 0.0–0.5)
Eosinophils Relative: 0 %
HCT: 30.5 % — ABNORMAL LOW (ref 39.0–52.0)
Hemoglobin: 9.4 g/dL — ABNORMAL LOW (ref 13.0–17.0)
Immature Granulocytes: 0 %
Lymphocytes Relative: 11 %
Lymphs Abs: 1.1 10*3/uL (ref 0.7–4.0)
MCH: 31.2 pg (ref 26.0–34.0)
MCHC: 30.8 g/dL (ref 30.0–36.0)
MCV: 101.3 fL — ABNORMAL HIGH (ref 80.0–100.0)
Monocytes Absolute: 0.7 10*3/uL (ref 0.1–1.0)
Monocytes Relative: 7 %
Neutro Abs: 8.4 10*3/uL — ABNORMAL HIGH (ref 1.7–7.7)
Neutrophils Relative %: 82 %
Platelets: 149 10*3/uL — ABNORMAL LOW (ref 150–400)
RBC: 3.01 MIL/uL — ABNORMAL LOW (ref 4.22–5.81)
RDW: 16.9 % — ABNORMAL HIGH (ref 11.5–15.5)
WBC: 10.2 10*3/uL (ref 4.0–10.5)
nRBC: 0 % (ref 0.0–0.2)

## 2024-11-28 LAB — TROPONIN T, HIGH SENSITIVITY
Troponin T High Sensitivity: 129 ng/L (ref 0–19)
Troponin T High Sensitivity: 123 ng/L (ref 0–19)

## 2024-11-28 LAB — ETHANOL: Alcohol, Ethyl (B): <15 mg/dL

## 2024-11-28 LAB — COMPREHENSIVE METABOLIC PANEL WITH GFR
ALT: 74 U/L — ABNORMAL HIGH (ref 0–44)
AST: 92 U/L — ABNORMAL HIGH (ref 15–41)
Albumin: 4.4 g/dL (ref 3.5–5.0)
Alkaline Phosphatase: 83 U/L (ref 38–126)
Anion gap: 16 — ABNORMAL HIGH (ref 5–15)
BUN: 66 mg/dL — ABNORMAL HIGH (ref 8–23)
CO2: 27 mmol/L (ref 22–32)
Calcium: 10.1 mg/dL (ref 8.9–10.3)
Chloride: 99 mmol/L (ref 98–111)
Creatinine, Ser: 14.7 mg/dL — ABNORMAL HIGH (ref 0.61–1.24)
GFR, Estimated: 3 mL/min — ABNORMAL LOW
Glucose, Bld: 140 mg/dL — ABNORMAL HIGH (ref 70–99)
Potassium: >7.5 mmol/L (ref 3.5–5.1)
Sodium: 141 mmol/L (ref 135–145)
Total Bilirubin: 0.7 mg/dL (ref 0.0–1.2)
Total Protein: 7.6 g/dL (ref 6.5–8.1)

## 2024-11-28 LAB — HEPATITIS B SURFACE ANTIGEN: Hepatitis B Surface Ag: NONREACTIVE

## 2024-11-28 LAB — CBG MONITORING, ED: Glucose-Capillary: 176 mg/dL — ABNORMAL HIGH (ref 70–99)

## 2024-11-28 LAB — I-STAT CHEM 8, ED
BUN: 70 mg/dL — ABNORMAL HIGH (ref 8–23)
Calcium, Ion: 1.16 mmol/L (ref 1.15–1.40)
Chloride: 103 mmol/L (ref 98–111)
Creatinine, Ser: 15.9 mg/dL — ABNORMAL HIGH (ref 0.61–1.24)
Glucose, Bld: 135 mg/dL — ABNORMAL HIGH (ref 70–99)
HCT: 28 % — ABNORMAL LOW (ref 39.0–52.0)
Hemoglobin: 9.5 g/dL — ABNORMAL LOW (ref 13.0–17.0)
Potassium: 7.5 mmol/L (ref 3.5–5.1)
Sodium: 136 mmol/L (ref 135–145)
TCO2: 25 mmol/L (ref 22–32)

## 2024-11-28 LAB — RESP PANEL BY RT-PCR (RSV, FLU A&B, COVID)  RVPGX2
Influenza A by PCR: NEGATIVE
Influenza B by PCR: NEGATIVE
Resp Syncytial Virus by PCR: NEGATIVE
SARS Coronavirus 2 by RT PCR: NEGATIVE

## 2024-11-28 LAB — PHOSPHORUS: Phosphorus: 1 mg/dL — CL (ref 2.5–4.6)

## 2024-11-28 LAB — AMMONIA: Ammonia: 19 umol/L (ref 9–35)

## 2024-11-28 LAB — MAGNESIUM: Magnesium: 5.6 mg/dL — ABNORMAL HIGH (ref 1.7–2.4)

## 2024-11-28 LAB — PROTIME-INR
INR: 1.2 (ref 0.8–1.2)
Prothrombin Time: 16.3 s — ABNORMAL HIGH (ref 11.4–15.2)

## 2024-11-28 MED ORDER — GABAPENTIN 300 MG PO CAPS
300.0000 mg | ORAL_CAPSULE | Freq: Every day | ORAL | Status: DC
  Administered 2024-11-28 – 2024-11-30 (×3): 300 mg via ORAL
  Filled 2024-11-28 (×3): qty 1

## 2024-11-28 MED ORDER — TAMSULOSIN HCL 0.4 MG PO CAPS
0.4000 mg | ORAL_CAPSULE | Freq: Every day | ORAL | Status: DC
  Administered 2024-11-28 – 2024-12-01 (×4): 0.4 mg via ORAL
  Filled 2024-11-28 (×4): qty 1

## 2024-11-28 MED ORDER — PENTAFLUOROPROP-TETRAFLUOROETH EX AERO: 1.0000 | INHALATION_SPRAY | CUTANEOUS | Status: DC | PRN

## 2024-11-28 MED ORDER — ALBUTEROL SULFATE (2.5 MG/3ML) 0.083% IN NEBU
10.0000 mg | INHALATION_SOLUTION | Freq: Once | RESPIRATORY_TRACT | Status: AC
  Administered 2024-11-28: 10 mg via RESPIRATORY_TRACT
  Filled 2024-11-28: qty 12

## 2024-11-28 MED ORDER — ACETAMINOPHEN 500 MG PO TABS: 500.0000 mg | ORAL_TABLET | Freq: Four times a day (QID) | ORAL | Status: DC | PRN

## 2024-11-28 MED ORDER — PANTOPRAZOLE SODIUM 40 MG PO TBEC
40.0000 mg | DELAYED_RELEASE_TABLET | Freq: Every day | ORAL | Status: DC
  Administered 2024-11-28 – 2024-12-01 (×4): 40 mg via ORAL
  Filled 2024-11-28 (×4): qty 1

## 2024-11-28 MED ORDER — SODIUM ZIRCONIUM CYCLOSILICATE 10 G PO PACK: 10.0000 g | PACK | Freq: Two times a day (BID) | ORAL | Status: DC

## 2024-11-28 MED ORDER — ALTEPLASE 2 MG IJ SOLR: 2.0000 mg | Freq: Once | INTRAMUSCULAR | Status: DC | PRN

## 2024-11-28 MED ORDER — DIPHENOXYLATE-ATROPINE 2.5-0.025 MG PO TABS: 1.0000 | ORAL_TABLET | Freq: Two times a day (BID) | ORAL | Status: DC | PRN

## 2024-11-28 MED ORDER — CALCIUM GLUCONATE-NACL 1-0.675 GM/50ML-% IV SOLN
1.0000 g | Freq: Once | INTRAVENOUS | Status: AC
  Administered 2024-11-28: 1000 mg via INTRAVENOUS
  Filled 2024-11-28: qty 50

## 2024-11-28 MED ORDER — PROCHLORPERAZINE EDISYLATE 10 MG/2ML IJ SOLN: 5.0000 mg | Freq: Four times a day (QID) | INTRAMUSCULAR | Status: DC | PRN

## 2024-11-28 MED ORDER — LIDOCAINE HCL (PF) 1 % IJ SOLN: 5.0000 mL | INTRAMUSCULAR | Status: DC | PRN

## 2024-11-28 MED ORDER — NICOTINE 14 MG/24HR TD PT24
14.0000 mg | MEDICATED_PATCH | Freq: Every day | TRANSDERMAL | Status: DC
  Filled 2024-11-28 (×3): qty 1

## 2024-11-28 MED ORDER — LIDOCAINE-PRILOCAINE 2.5-2.5 % EX CREA: 1.0000 | TOPICAL_CREAM | CUTANEOUS | Status: DC | PRN

## 2024-11-28 MED ORDER — FOLIC ACID 1 MG PO TABS
1.0000 mg | ORAL_TABLET | Freq: Every day | ORAL | Status: DC
  Administered 2024-11-28 – 2024-12-01 (×4): 1 mg via ORAL
  Filled 2024-11-28 (×4): qty 1

## 2024-11-28 MED ORDER — DEXTROSE 50 % IV SOLN
1.0000 | Freq: Once | INTRAVENOUS | Status: AC
  Administered 2024-11-28: 50 mL via INTRAVENOUS
  Filled 2024-11-28: qty 50

## 2024-11-28 MED ORDER — HEPARIN SODIUM (PORCINE) 1000 UNIT/ML DIALYSIS: 100.0000 [IU]/kg | INTRAMUSCULAR | Status: DC | PRN

## 2024-11-28 MED ORDER — OXYCODONE HCL 5 MG PO TABS
5.0000 mg | ORAL_TABLET | Freq: Four times a day (QID) | ORAL | Status: DC | PRN
  Administered 2024-11-28 – 2024-12-01 (×4): 5 mg via ORAL
  Filled 2024-11-28 (×4): qty 1

## 2024-11-28 MED ORDER — FUROSEMIDE 40 MG PO TABS
40.0000 mg | ORAL_TABLET | Freq: Two times a day (BID) | ORAL | Status: DC
  Administered 2024-11-28 – 2024-12-01 (×6): 40 mg via ORAL
  Filled 2024-11-28 (×6): qty 1

## 2024-11-28 MED ORDER — HEPARIN SODIUM (PORCINE) 1000 UNIT/ML DIALYSIS: 1000.0000 [IU] | INTRAMUSCULAR | Status: DC | PRN

## 2024-11-28 MED ORDER — SODIUM ZIRCONIUM CYCLOSILICATE 5 G PO PACK
10.0000 g | PACK | Freq: Once | ORAL | Status: AC
  Administered 2024-11-28: 10 g via ORAL
  Filled 2024-11-28: qty 2

## 2024-11-28 MED ORDER — CHLORHEXIDINE GLUCONATE CLOTH 2 % EX PADS
6.0000 | MEDICATED_PAD | Freq: Every day | CUTANEOUS | Status: DC
  Administered 2024-11-28 – 2024-12-01 (×4): 6 via TOPICAL

## 2024-11-28 MED ORDER — INSULIN ASPART 100 UNIT/ML IV SOLN
5.0000 [IU] | Freq: Once | INTRAVENOUS | Status: AC
  Administered 2024-11-28: 5 [IU] via INTRAVENOUS
  Filled 2024-11-28: qty 1

## 2024-11-28 MED ORDER — ANTICOAGULANT SODIUM CITRATE 4% (200MG/5ML) IV SOLN: 5.0000 mL | Status: DC | PRN

## 2024-11-28 MED ORDER — POLYETHYLENE GLYCOL 3350 17 G PO PACK: 17.0000 g | PACK | Freq: Every day | ORAL | Status: DC | PRN

## 2024-11-28 MED ORDER — ASPIRIN 81 MG PO TBEC
81.0000 mg | DELAYED_RELEASE_TABLET | Freq: Every day | ORAL | Status: DC
  Administered 2024-11-28 – 2024-12-01 (×4): 81 mg via ORAL
  Filled 2024-11-28 (×4): qty 1

## 2024-11-28 MED ORDER — HEPARIN SODIUM (PORCINE) 5000 UNIT/ML IJ SOLN
5000.0000 [IU] | Freq: Three times a day (TID) | INTRAMUSCULAR | Status: DC
  Administered 2024-11-28 – 2024-12-01 (×9): 5000 [IU] via SUBCUTANEOUS
  Filled 2024-11-28 (×9): qty 1

## 2024-11-28 NOTE — ED Notes (Signed)
 Date and time results received: 11/28/24 0529   Test: I-Stat K+ Critical Value: 7.5  Name of Provider Notified: Haze, MD

## 2024-11-28 NOTE — Consult Note (Signed)
 ESRD Consult Note  Requesting provider: Terry Hurst Service requesting consult: Hospitalist Reason for consult: ESRD, provision of dialysis Indication for acute dialysis?: End Stage Renal Disease  Outpatient dialysis unit: [facility]  Assessment/Recommendations: Charles [name] is a/an 69 y.o. male with a past medical history notable for ESRD on HD admitted with severe hyperkalemia.   # ESRD: Dialysis urgently today for hyperkalemia.  Again tomorrow if he remains inpatient.  # Volume/ hypertension: Mild volume overload on exam.  Ultrafiltration with dialysis.  Continue home blood pressure medications  # Anemia of Chronic Kidney Disease: Hemoglobin 9.5.  Consider ESA if he remains inpatient.  Would confirm outpatient ESA schedule  # Secondary Hyperparathyroidism/Hyperphosphatemia: Phosphorus very low on initial labs.  Hard to believe.  Will check again tomorrow.  Hold any home binders until we confirm.  # Vascular access: AVG with no issues  # Severe hyperkalemia: With EKG changes.  Dialysis as above.  Status post shifting and depleting agents.  #  #  # Additional recommendations: - Dose all meds for creatinine clearance < 10 ml/min  - Unless absolutely necessary, no MRIs with gadolinium.  - Implement save arm precautions.  Prefer needle sticks in the dorsum of the hands or wrists.  No blood pressure measurements in arm. - If blood transfusion is requested during hemodialysis sessions, please alert us  prior to the session.  - Use synthetic opioids (Fentanyl /Dilaudid ) if needed  Recommendations were discussed with the primary team.   History of Present Illness: Charles [name] is a/an 69 y.o. male with a past medical history of ESRD who presents with weakness  Patient presented to the hospital yesterday after feeling very weak.  He had not had dialysis in about a week due to transportation issues related to the weather.  Over the last few days he has had worsening weakness and he  fell at 1 point hitting his hip.  He denies fevers, chills, shortness of breath, chest pain, nausea, vomiting, diarrhea.  This morning he says he is very hungry.  In the emergency department he was found to have significant hyperkalemia with potassium of 7.5.  EKG changes present.  Was given depleting and shifting agents.  Call for dialysis this morning urgently.   Medications:  Current Facility-Administered Medications  Medication Dose Route Frequency Provider Last Rate Last Admin   alteplase  (CATHFLO ACTIVASE ) injection 2 mg  2 mg Intracatheter Once PRN Macel Jayson PARAS, MD       anticoagulant sodium citrate  solution 5 mL  5 mL Intracatheter PRN Macel Jayson PARAS, MD       Chlorhexidine  Gluconate Cloth 2 % PADS 6 each  6 each Topical Q0600 Macel Jayson PARAS, MD   6 each at 11/28/24 0843   heparin  injection 1,000 Units  1,000 Units Intracatheter PRN Macel Jayson PARAS, MD       heparin  injection 8,700 Units  100 Units/kg Dialysis PRN Macel Jayson PARAS, MD       lidocaine  (PF) (XYLOCAINE ) 1 % injection 5 mL  5 mL Intradermal PRN Macel Jayson PARAS, MD       lidocaine -prilocaine  (EMLA ) cream 1 Application  1 Application Topical PRN Yair Dusza J, MD       pentafluoroprop-tetrafluoroeth JUANA) aerosol 1 Application  1 Application Topical PRN Leia Coletti J, MD       polyethylene glycol (MIRALAX  / GLYCOLAX ) packet 17 g  17 g Oral Daily PRN Hurst Terry N, DO       prochlorperazine  (COMPAZINE ) injection 5 mg  5 mg Intravenous Q6H PRN Shona Laurence N, DO       sodium zirconium cyclosilicate  (LOKELMA ) packet 10 g  10 g Oral BID Shona Laurence SAILOR, DO         ALLERGIES Patient has no known allergies.  MEDICAL HISTORY Past Medical History:  Diagnosis Date   Anemia    Anxiety    Chronic back pain    Dementia    Nursing facility feels he has Dementia   Diabetes mellitus    Pt reports no longer has   Dialysis patient    LUE restriction   Facial numbness    Fracture of right foot     Hepatitis    B and C   Hypertension    Mental disorder    schizophrenia   Renal insufficiency    Dialysis T, TH, Sat     SOCIAL HISTORY Social History   Socioeconomic History   Marital status: Single    Spouse name: Not on file   Number of children: Not on file   Years of education: Not on file   Highest education level: Not on file  Occupational History   Not on file  Tobacco Use   Smoking status: Every Day    Current packs/day: 0.25    Average packs/day: 0.3 packs/day for 40.0 years (10.0 ttl pk-yrs)    Types: Cigarettes   Smokeless tobacco: Never   Tobacco comments:    Smoke   Vaping Use   Vaping status: Never Used  Substance and Sexual Activity   Alcohol use: Yes    Alcohol/week: 6.0 standard drinks of alcohol    Types: 6 Cans of beer per week    Comment: every other day   Drug use: No   Sexual activity: Not on file  Other Topics Concern   Not on file  Social History Narrative   Not on file   Social Drivers of Health   Tobacco Use: High Risk (11/28/2024)   Patient History    Smoking Tobacco Use: Every Day    Smokeless Tobacco Use: Never    Passive Exposure: Not on file  Financial Resource Strain: Low Risk (08/28/2024)   Overall Financial Resource Strain (CARDIA)    Difficulty of Paying Living Expenses: Not hard at all  Food Insecurity: No Food Insecurity (11/28/2024)   Epic    Worried About Radiation Protection Practitioner of Food in the Last Year: Never true    Ran Out of Food in the Last Year: Never true  Transportation Needs: No Transportation Needs (11/28/2024)   Epic    Lack of Transportation (Medical): No    Lack of Transportation (Non-Medical): No  Physical Activity: Inactive (08/28/2024)   Exercise Vital Sign    Days of Exercise per Week: 0 days    Minutes of Exercise per Session: 0 min  Stress: No Stress Concern Present (08/28/2024)   [employer] of Occupational Health - Occupational Stress Questionnaire    Feeling of Stress: Not at all  Social  Connections: Socially Isolated (11/28/2024)   Social Connection and Isolation Panel    Frequency of Communication with Friends and Family: Three times a week    Frequency of Social Gatherings with Friends and Family: More than three times a week    Attends Religious Services: Never    Database Administrator or Organizations: No    Attends Banker Meetings: Never    Marital Status: Never married  Intimate Partner Violence: Not At Risk (11/28/2024)  Epic    Fear of Current or Ex-Partner: No    Emotionally Abused: No    Physically Abused: No    Sexually Abused: No  Depression (PHQ2-9): Low Risk (10/29/2024)   Depression (PHQ2-9)    PHQ-2 Score: 0  Alcohol Screen: Low Risk (08/28/2024)   Alcohol Screen    Last Alcohol Screening Score (AUDIT): 0  Housing: Low Risk (11/28/2024)   Epic    Unable to Pay for Housing in the Last Year: No    Number of Times Moved in the Last Year: 0    Homeless in the Last Year: No  Utilities: Not At Risk (11/28/2024)   Epic    Threatened with loss of utilities: No  Health Literacy: Adequate Health Literacy (08/28/2024)   B1300 Health Literacy    Frequency of need for help with medical instructions: Never     FAMILY HISTORY Family History  Problem Relation Age of Onset   Diabetes Brother    Colon cancer Neg Hx    Liver disease Neg Hx        unknown for sure, mom died at age 6     Review of Systems: 27 systems were reviewed and negative except per HPI  Physical Exam: Vitals:   11/28/24 0924 11/28/24 0930  BP: (!) 140/86   Pulse: 99 99  Resp: 18   Temp: 98.4 F (36.9 C)   SpO2: 100% 100%   No intake/output data recorded.  Intake/Output Summary (Last 24 hours) at 11/28/2024 1003 Last data filed at 11/28/2024 9388 Gross per 24 hour  Intake 50 ml  Output --  Net 50 ml   General: well-appearing, no acute distress HEENT: anicteric sclera, MMM CV: normal rate, no murmurs, trace edema Lungs: bilateral chest rise, normal  wob Abd: soft, non-tender, non-distended Skin: no visible lesions or rashes Psych: alert, engaged, appropriate mood and affect Neuro: normal speech, no gross focal deficits   Test Results Reviewed Lab Results  Component Value Date   NA 136 11/28/2024   K 7.5 (HH) 11/28/2024   CL 103 11/28/2024   CO2 27 11/28/2024   BUN 70 (H) 11/28/2024   CREATININE 15.90 (H) 11/28/2024   CALCIUM  10.1 11/28/2024   ALBUMIN  4.4 11/28/2024   PHOS 1.0 (LL) 11/28/2024    I have reviewed relevant outside healthcare records

## 2024-11-28 NOTE — Plan of Care (Signed)
" °  Problem: Acute Rehab PT Goals(only PT should resolve) Goal: Pt Will Go Supine/Side To Sit Outcome: Progressing Flowsheets (Taken 11/28/2024 1557) Pt will go Supine/Side to Sit: Independently Goal: Patient Will Transfer Sit To/From Stand Outcome: Progressing Flowsheets (Taken 11/28/2024 1557) Patient will transfer sit to/from stand: with supervision Goal: Pt Will Transfer Bed To Chair/Chair To Bed Outcome: Progressing Flowsheets (Taken 11/28/2024 1557) Pt will Transfer Bed to Chair/Chair to Bed: with supervision Goal: Pt Will Ambulate Outcome: Progressing Flowsheets (Taken 11/28/2024 1557) Pt will Ambulate:  75 feet  with least restrictive assistive device  with supervision   3:58 PM, 11/28/24 Rosaria Settler, PT, DPT [facility] with [facility]  "

## 2024-11-28 NOTE — Progress Notes (Addendum)
 Pt receives out-pt HD at [facility], TTS, 0530am chair time. Will continue to assist as needed.    Lavanda Callum Wolf Dialysis Navigator [phone]   Addendum 0945am Noted in progression that pt may be receiving HD on day of discharge. Pt cannot be diverted due to weather, and he is already on HD. Unable to divert. Will continue to assist as needed.
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
## 2024-11-28 NOTE — Progress Notes (Signed)
 Called for pt report, notified that assigned nurse is currently in with acutely ill new EMS arrival and will call me back to give updated report.

## 2024-11-28 NOTE — ED Notes (Signed)
 Trop 129 Potassium >7.5 Phos 1 Reported to EDP Pollina at this time  See orders
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
## 2024-11-28 NOTE — Progress Notes (Signed)
 Due to the patient's high blood potassium level, a 1 k solution was used for HD initially, then switched to 2 k solution after 1 hour. After completing dialysis treatment, the patient had two liters of fluid removed. The patient said :  I want to go home.SABRA  11/28/24 1254  Vitals  Temp 98.4 F (36.9 C)  Temp Source Oral  BP 138/80  BP Location Right Arm  BP Method Automatic  Patient Position (if appropriate) Lying  Pulse Rate 90  ECG Heart Rate 90  Resp 16  Weight 87 kg  Type of Weight Post-Dialysis  Oxygen Therapy  SpO2 98 %  O2 Device Room Air  During Treatment Monitoring  Intra-Hemodialysis Comments Tx completed  Post Treatment  Dialyzer Clearance Lightly streaked  Hemodialysis Intake (mL) 0 mL  Liters Processed 78  Fluid Removed (mL) 2000 mL  Tolerated HD Treatment Yes  Post-Hemodialysis Comments see notes.  AVG/AVF Arterial Site Held (minutes) 10 minutes  AVG/AVF Venous Site Held (minutes) 10 minutes

## 2024-11-28 NOTE — Procedures (Signed)
 I was present at this dialysis session. I have reviewed the session itself and made appropriate changes.   Urgent HD for hyperkalemia. 2k bath  Filed Weights   11/28/24 0510 11/28/24 0837 11/28/24 0924  Weight: 86.6 kg 88.9 kg 89 kg    Recent Labs  Lab 11/28/24 0511 11/28/24 0525  NA 141 136  K >7.5* 7.5*  CL 99 103  CO2 27  --   GLUCOSE 140* 135*  BUN 66* 70*  CREATININE 14.70* 15.90*  CALCIUM  10.1  --   PHOS 1.0*  --     Recent Labs  Lab 11/28/24 0511 11/28/24 0525  WBC 10.2  --   NEUTROABS 8.4*  --   HGB 9.4* 9.5*  HCT 30.5* 28.0*  MCV 101.3*  --   PLT 149*  --     Scheduled Meds:  Chlorhexidine  Gluconate Cloth  6 each Topical Q0600   Continuous Infusions:  anticoagulant sodium citrate      PRN Meds:.alteplase , anticoagulant sodium citrate , heparin , heparin , lidocaine  (PF), lidocaine -prilocaine , pentafluoroprop-tetrafluoroeth, polyethylene glycol, prochlorperazine    Jayson Player,  MD 11/28/2024, 10:09 AM
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
## 2024-11-28 NOTE — Progress Notes (Signed)
 Pt going down via bed for dialysis treatment.

## 2024-11-28 NOTE — Discharge Instructions (Signed)
 [facility] [employer] : PRIVATE PAY (wheelchair [place] )  [phone]

## 2024-11-29 DIAGNOSIS — E875 Hyperkalemia: Secondary | ICD-10-CM | POA: Diagnosis not present

## 2024-11-29 LAB — CBC
HCT: 25.7 % — ABNORMAL LOW (ref 39.0–52.0)
Hemoglobin: 7.9 g/dL — ABNORMAL LOW (ref 13.0–17.0)
MCH: 31.2 pg (ref 26.0–34.0)
MCHC: 30.7 g/dL (ref 30.0–36.0)
MCV: 101.6 fL — ABNORMAL HIGH (ref 80.0–100.0)
Platelets: 139 10*3/uL — ABNORMAL LOW (ref 150–400)
RBC: 2.53 MIL/uL — ABNORMAL LOW (ref 4.22–5.81)
RDW: 16.8 % — ABNORMAL HIGH (ref 11.5–15.5)
WBC: 6.7 10*3/uL (ref 4.0–10.5)
nRBC: 0.3 % — ABNORMAL HIGH (ref 0.0–0.2)

## 2024-11-29 LAB — COMPREHENSIVE METABOLIC PANEL WITH GFR
ALT: 80 U/L — ABNORMAL HIGH (ref 0–44)
AST: 53 U/L — ABNORMAL HIGH (ref 15–41)
Albumin: 3.5 g/dL (ref 3.5–5.0)
Alkaline Phosphatase: 72 U/L (ref 38–126)
Anion gap: 10 (ref 5–15)
BUN: 38 mg/dL — ABNORMAL HIGH (ref 8–23)
CO2: 31 mmol/L (ref 22–32)
Calcium: 8.6 mg/dL — ABNORMAL LOW (ref 8.9–10.3)
Chloride: 99 mmol/L (ref 98–111)
Creatinine, Ser: 9.51 mg/dL — ABNORMAL HIGH (ref 0.61–1.24)
GFR, Estimated: 6 mL/min — ABNORMAL LOW
Glucose, Bld: 69 mg/dL — ABNORMAL LOW (ref 70–99)
Potassium: 5.5 mmol/L — ABNORMAL HIGH (ref 3.5–5.1)
Sodium: 140 mmol/L (ref 135–145)
Total Bilirubin: 0.7 mg/dL (ref 0.0–1.2)
Total Protein: 6.5 g/dL (ref 6.5–8.1)

## 2024-11-29 LAB — PHOSPHORUS: Phosphorus: 2.4 mg/dL — ABNORMAL LOW (ref 2.5–4.6)

## 2024-11-29 LAB — HEPATITIS B SURFACE ANTIBODY, QUANTITATIVE: Hep B S AB Quant (Post): 17.3 m[IU]/mL

## 2024-11-29 LAB — GLUCOSE, CAPILLARY: Glucose-Capillary: 102 mg/dL — ABNORMAL HIGH (ref 70–99)
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
## 2024-11-29 NOTE — Plan of Care (Signed)
  Problem: Clinical Measurements: Goal: Will remain free from infection Outcome: Progressing   Problem: Activity: Goal: Risk for activity intolerance will decrease Outcome: Progressing   Problem: Coping: Goal: Level of anxiety will decrease Outcome: Progressing   Problem: Elimination: Goal: Will not experience complications related to urinary retention Outcome: Progressing   Problem: Pain Managment: Goal: General experience of comfort will improve and/or be controlled Outcome: Progressing   Problem: Safety: Goal: Ability to remain free from injury will improve Outcome: Progressing   Problem: Skin Integrity: Goal: Risk for impaired skin integrity will decrease Outcome: Progressing

## 2024-11-29 NOTE — Progress Notes (Signed)
 Pt arrived to unit for 3.5hr tx Completed tx using R AVG Pt transported back to room with no s/s distress noted.   11/29/24 1300  Vitals  Temp 98.4 F (36.9 C)  Pulse Rate 83  Resp (!) 21  BP (!) 154/80  SpO2 98 %  O2 Device Nasal Cannula  Weight 88.3 kg  Oxygen Therapy  O2 Flow Rate (L/min) 2 L/min  Post Treatment  Hemodialysis Intake (mL) 0 mL  Liters Processed 84  Fluid Removed (mL) 2000 mL  Tolerated HD Treatment Yes  AVG/AVF Arterial Site Held (minutes) 5 minutes  AVG/AVF Venous Site Held (minutes) 5 minutes
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
## 2024-11-29 NOTE — Care Management Obs Status (Signed)
 MEDICARE OBSERVATION STATUS NOTIFICATION   Patient Details  Name: Charles [name] MRN: [id number] Date of Birth: [birth year]   Medicare Observation Status Notification Given:  Yes    Sharlyne Stabs, RN 11/29/2024, 2:42 PM

## 2024-11-29 NOTE — Care Management CC44 (Signed)
"         Condition Code 44 Documentation Completed  Patient Details  Name: Charles [name] MRN: [id number] Date of Birth: 1956-07-15   Condition Code 44 given:  Yes Patient signature on Condition Code 44 notice:  Yes Documentation of 2 MD's agreement:  Yes Code 44 added to claim:  Yes  Patient back in room 304, has been in dialysis. RN at bedside. Code 44 completed, printed copy for patient.    Sharlyne Stabs, RN 11/29/2024, 2:42 PM  "

## 2024-11-30 DIAGNOSIS — E875 Hyperkalemia: Secondary | ICD-10-CM | POA: Diagnosis not present
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
## 2024-11-30 NOTE — Plan of Care (Signed)
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
## 2024-12-01 DIAGNOSIS — E875 Hyperkalemia: Secondary | ICD-10-CM | POA: Diagnosis not present

## 2024-12-01 NOTE — Plan of Care (Signed)
   Problem: Clinical Measurements: Goal: Will remain free from infection Outcome: Progressing Goal: Respiratory complications will improve Outcome: Progressing

## 2024-12-01 NOTE — Progress Notes (Signed)
 Late note entry 2/2 1055am   D/c orders noted. Contacted out-pt HD clinic and informed of pt d/c and anticipated arrival back to clinic tmrrw pending wetaher. No further support is needed. D/c summ and last note faxed.  Lavanda Niemah Schwebke Dialysis Navigator [phone]

## 2024-12-02 ENCOUNTER — Telehealth: Payer: Self-pay

## 2024-12-03 ENCOUNTER — Telehealth: Payer: Self-pay | Admitting: Internal Medicine

## 2024-12-03 ENCOUNTER — Ambulatory Visit: Admitting: Orthopedic Surgery

## 2024-12-03 DIAGNOSIS — T148XXA Other injury of unspecified body region, initial encounter: Secondary | ICD-10-CM

## 2024-12-03 NOTE — Telephone Encounter (Signed)
 Copied from CRM [id number]. Topic: General - Other >> Dec 03, 2024 11:22 AM Hadassah PARAS wrote: Reason for CRM: Pt returning missed call from [facility], LPN. Please call pt back on #[phone]

## 2024-12-03 NOTE — Progress Notes (Signed)
" ° ° °  12/03/2024   Chief Complaint  Patient presents with   Elbow Problem    Right     No diagnosis found.  What pharmacy do you use ? ____[facility]_______  DOI/DOS/ Date:    Did you get better, worse or no change (Answer below)   Unchanged area came back       "

## 2024-12-03 NOTE — Transitions of Care (Post Inpatient/ED Visit) (Unsigned)
" ° °  12/03/2024  Name: Charles [name] MRN: [id number] DOB: Mar 22, 1956  Today's TOC FU Call Status: Today's TOC FU Call Status:: Unsuccessful Call (2nd Attempt) Unsuccessful Call (1st Attempt) Date: 12/02/24 Unsuccessful Call (2nd Attempt) Date: 12/03/24  Attempted to reach the patient regarding the most recent Inpatient/ED visit.  Follow Up Plan: Additional outreach attempts will be made to reach the patient to complete the Transitions of Care (Post Inpatient/ED visit) call.   Signature Julian Lemmings, LPN [facility] Nurse Health Advisor Direct Dial  [phone]  "

## 2024-12-03 NOTE — Progress Notes (Signed)
" ° °  Chief Complaint  Patient presents with   Elbow Problem    Right     69 year old male status post aspiration blood-filled cyst on his right forearm.  Some of the fluid has come back.  He would like to have it aspirated again versus surgical excision    PHYSICAL EXAM:   Right forearm near the elbow but distal to the olecranon over the ulna there is a mass it is noted to have some thickening some fluctuance  Assessment and Plan:   Blood-filled cyst/hematoma  Patient opted for aspiration  Procedure note for injection   Chief Complaint  Patient presents with   Elbow Problem    Right      Encounter Diagnosis  Name Primary?   Hematoma Yes        The patient has consented for aspiration Joint: Forearm cyst right, upper extremity  Time out completed: Yes  The forearm was cleaned with alcohol we used ethyl chloride for anesthesia we used an 18-gauge needle a 3 cc syringe we aspirated 1 cc of blood  Sterile bandage applied patient advised to come back in 2 weeks for recheck  "

## 2024-12-05 NOTE — Transitions of Care (Post Inpatient/ED Visit) (Signed)
" ° °  12/05/2024  Name: Charles [name] MRN: [id number] DOB: [birth year]  Today's TOC FU Call Status: Today's TOC FU Call Status:: Unsuccessful Call (3rd Attempt) Unsuccessful Call (1st Attempt) Date: 12/02/24 Unsuccessful Call (2nd Attempt) Date: 12/03/24 Unsuccessful Call (3rd Attempt) Date: 12/05/24  Attempted to reach the patient regarding the most recent Inpatient/ED visit.  Follow Up Plan: No further outreach attempts will be made at this time. We have been unable to contact the patient.  Signature Julian Lemmings, LPN [facility] Nurse Health Advisor Direct Dial  [phone]  "

## 2024-12-17 ENCOUNTER — Ambulatory Visit: Admitting: Orthopedic Surgery

## 2025-03-02 ENCOUNTER — Ambulatory Visit: Payer: Self-pay | Admitting: Internal Medicine

## 2025-09-02 ENCOUNTER — Ambulatory Visit
